# Patient Record
Sex: Male | Born: 1960 | Race: Black or African American | Hispanic: No | Smoking: Former smoker
Health system: Southern US, Community
[De-identification: ages and names within clinical notes are randomized; demographics above are authoritative.]

## PROBLEM LIST (undated history)

## (undated) DIAGNOSIS — I10 Essential (primary) hypertension: Secondary | ICD-10-CM

## (undated) DIAGNOSIS — E559 Vitamin D deficiency, unspecified: Secondary | ICD-10-CM

## (undated) DIAGNOSIS — E119 Type 2 diabetes mellitus without complications: Secondary | ICD-10-CM

## (undated) DIAGNOSIS — K219 Gastro-esophageal reflux disease without esophagitis: Secondary | ICD-10-CM

## (undated) DIAGNOSIS — M109 Gout, unspecified: Secondary | ICD-10-CM

## (undated) DIAGNOSIS — D509 Iron deficiency anemia, unspecified: Secondary | ICD-10-CM

## (undated) DIAGNOSIS — B019 Varicella without complication: Secondary | ICD-10-CM

## (undated) HISTORY — PX: TONSILLECTOMY: SUR1361

## (undated) HISTORY — DX: Varicella without complication: B01.9

## (undated) HISTORY — DX: Gastro-esophageal reflux disease without esophagitis: K21.9

## (undated) HISTORY — DX: Iron deficiency anemia, unspecified: D50.9

## (undated) HISTORY — DX: Essential (primary) hypertension: I10

## (undated) HISTORY — DX: Vitamin D deficiency, unspecified: E55.9

## (undated) HISTORY — DX: Gout, unspecified: M10.9

---

## 2003-03-01 ENCOUNTER — Emergency Department (HOSPITAL_COMMUNITY): Admission: EM | Admit: 2003-03-01 | Discharge: 2003-03-01 | Payer: Self-pay | Admitting: Emergency Medicine

## 2003-09-01 ENCOUNTER — Emergency Department (HOSPITAL_COMMUNITY): Admission: EM | Admit: 2003-09-01 | Discharge: 2003-09-01 | Payer: Self-pay | Admitting: Family Medicine

## 2003-10-12 ENCOUNTER — Emergency Department (HOSPITAL_COMMUNITY): Admission: EM | Admit: 2003-10-12 | Discharge: 2003-10-12 | Payer: Self-pay | Admitting: Family Medicine

## 2003-10-15 ENCOUNTER — Emergency Department (HOSPITAL_COMMUNITY): Admission: EM | Admit: 2003-10-15 | Discharge: 2003-10-15 | Payer: Self-pay | Admitting: Family Medicine

## 2004-12-31 ENCOUNTER — Emergency Department (HOSPITAL_COMMUNITY): Admission: EM | Admit: 2004-12-31 | Discharge: 2004-12-31 | Payer: Self-pay | Admitting: Emergency Medicine

## 2005-01-01 ENCOUNTER — Emergency Department (HOSPITAL_COMMUNITY): Admission: EM | Admit: 2005-01-01 | Discharge: 2005-01-01 | Payer: Self-pay | Admitting: Family Medicine

## 2005-01-03 ENCOUNTER — Emergency Department (HOSPITAL_COMMUNITY): Admission: EM | Admit: 2005-01-03 | Discharge: 2005-01-03 | Payer: Self-pay | Admitting: Family Medicine

## 2005-01-07 ENCOUNTER — Emergency Department (HOSPITAL_COMMUNITY): Admission: EM | Admit: 2005-01-07 | Discharge: 2005-01-07 | Payer: Self-pay | Admitting: Family Medicine

## 2008-02-23 ENCOUNTER — Emergency Department (HOSPITAL_COMMUNITY): Admission: EM | Admit: 2008-02-23 | Discharge: 2008-02-23 | Payer: Self-pay | Admitting: Family Medicine

## 2008-02-24 ENCOUNTER — Emergency Department (HOSPITAL_COMMUNITY): Admission: EM | Admit: 2008-02-24 | Discharge: 2008-02-24 | Payer: Self-pay | Admitting: Emergency Medicine

## 2008-08-20 ENCOUNTER — Emergency Department (HOSPITAL_COMMUNITY): Admission: EM | Admit: 2008-08-20 | Discharge: 2008-08-20 | Payer: Self-pay | Admitting: Emergency Medicine

## 2008-09-01 ENCOUNTER — Emergency Department (HOSPITAL_COMMUNITY): Admission: EM | Admit: 2008-09-01 | Discharge: 2008-09-01 | Payer: Self-pay | Admitting: Emergency Medicine

## 2010-09-07 LAB — HEMOGLOBIN A1C: Hgb A1c MFr Bld: 8.3 % — ABNORMAL HIGH (ref 4.6–6.1)

## 2010-09-07 LAB — GLUCOSE, CAPILLARY

## 2010-09-08 LAB — POCT I-STAT, CHEM 8
BUN: 17 mg/dL (ref 6–23)
Calcium, Ion: 1.17 mmol/L (ref 1.12–1.32)
Chloride: 104 mEq/L (ref 96–112)
Creatinine, Ser: 1 mg/dL (ref 0.4–1.5)
TCO2: 25 mmol/L (ref 0–100)

## 2010-09-22 ENCOUNTER — Inpatient Hospital Stay (INDEPENDENT_AMBULATORY_CARE_PROVIDER_SITE_OTHER)
Admission: RE | Admit: 2010-09-22 | Discharge: 2010-09-22 | Disposition: A | Discharge status: Home / Self Care | Payer: Self-pay | Source: Ambulatory Visit | Attending: Family Medicine | Admitting: Family Medicine

## 2010-09-22 DIAGNOSIS — IMO0002 Reserved for concepts with insufficient information to code with codable children: Secondary | ICD-10-CM

## 2010-09-23 ENCOUNTER — Inpatient Hospital Stay (INDEPENDENT_AMBULATORY_CARE_PROVIDER_SITE_OTHER)
Admission: RE | Admit: 2010-09-23 | Discharge: 2010-09-23 | Disposition: A | Discharge status: Home / Self Care | Payer: Self-pay | Source: Ambulatory Visit | Attending: Family Medicine | Admitting: Family Medicine

## 2010-09-23 DIAGNOSIS — M109 Gout, unspecified: Secondary | ICD-10-CM

## 2010-09-24 ENCOUNTER — Inpatient Hospital Stay (INDEPENDENT_AMBULATORY_CARE_PROVIDER_SITE_OTHER)
Admission: RE | Admit: 2010-09-24 | Discharge: 2010-09-24 | Disposition: A | Discharge status: Home / Self Care | Payer: Self-pay | Source: Ambulatory Visit | Attending: Emergency Medicine | Admitting: Emergency Medicine

## 2010-09-24 DIAGNOSIS — IMO0002 Reserved for concepts with insufficient information to code with codable children: Secondary | ICD-10-CM

## 2010-09-24 DIAGNOSIS — M109 Gout, unspecified: Secondary | ICD-10-CM

## 2010-09-26 ENCOUNTER — Inpatient Hospital Stay (INDEPENDENT_AMBULATORY_CARE_PROVIDER_SITE_OTHER)
Admission: RE | Admit: 2010-09-26 | Discharge: 2010-09-26 | Disposition: A | Discharge status: Home / Self Care | Payer: Self-pay | Source: Ambulatory Visit

## 2010-09-26 DIAGNOSIS — M109 Gout, unspecified: Secondary | ICD-10-CM

## 2010-09-26 DIAGNOSIS — L0291 Cutaneous abscess, unspecified: Secondary | ICD-10-CM

## 2010-09-26 LAB — CULTURE, ROUTINE-ABSCESS

## 2010-09-26 LAB — POCT I-STAT, CHEM 8
BUN: 20 mg/dL (ref 6–23)
Calcium, Ion: 1.13 mmol/L (ref 1.12–1.32)
HCT: 38 % — ABNORMAL LOW (ref 39.0–52.0)
Sodium: 136 mEq/L (ref 135–145)
TCO2: 23 mmol/L (ref 0–100)

## 2010-09-26 LAB — URIC ACID: Uric Acid, Serum: 5.6 mg/dL (ref 4.0–7.8)

## 2010-09-27 ENCOUNTER — Inpatient Hospital Stay (INDEPENDENT_AMBULATORY_CARE_PROVIDER_SITE_OTHER)
Admission: RE | Admit: 2010-09-27 | Discharge: 2010-09-27 | Disposition: A | Discharge status: Home / Self Care | Payer: Self-pay | Source: Ambulatory Visit | Attending: Emergency Medicine | Admitting: Emergency Medicine

## 2010-09-27 DIAGNOSIS — IMO0002 Reserved for concepts with insufficient information to code with codable children: Secondary | ICD-10-CM

## 2010-10-16 ENCOUNTER — Ambulatory Visit (INDEPENDENT_AMBULATORY_CARE_PROVIDER_SITE_OTHER): Payer: Self-pay

## 2010-10-16 ENCOUNTER — Inpatient Hospital Stay (INDEPENDENT_AMBULATORY_CARE_PROVIDER_SITE_OTHER)
Admission: RE | Admit: 2010-10-16 | Discharge: 2010-10-16 | Disposition: A | Discharge status: Home / Self Care | Payer: Self-pay | Source: Ambulatory Visit | Attending: Emergency Medicine | Admitting: Emergency Medicine

## 2010-10-16 ENCOUNTER — Emergency Department (HOSPITAL_COMMUNITY)
Admission: EM | Admit: 2010-10-16 | Discharge: 2010-10-17 | Disposition: A | Discharge status: Home / Self Care | Payer: Self-pay | Attending: Emergency Medicine | Admitting: Emergency Medicine

## 2010-10-16 DIAGNOSIS — M25469 Effusion, unspecified knee: Secondary | ICD-10-CM

## 2010-10-16 DIAGNOSIS — M25569 Pain in unspecified knee: Secondary | ICD-10-CM | POA: Insufficient documentation

## 2010-10-16 LAB — DIFFERENTIAL
Basophils Absolute: 0 10*3/uL (ref 0.0–0.1)
Basophils Relative: 0 % (ref 0–1)
Eosinophils Absolute: 0.1 10*3/uL (ref 0.0–0.7)
Eosinophils Relative: 1 % (ref 0–5)
Lymphs Abs: 2.9 10*3/uL (ref 0.7–4.0)
Neutrophils Relative %: 62 % (ref 43–77)

## 2010-10-16 LAB — CBC
MCV: 77.7 fL — ABNORMAL LOW (ref 78.0–100.0)
Platelets: 280 10*3/uL (ref 150–400)
RBC: 4.85 MIL/uL (ref 4.22–5.81)
RDW: 14.8 % (ref 11.5–15.5)
WBC: 9.8 10*3/uL (ref 4.0–10.5)

## 2010-10-16 LAB — URIC ACID: Uric Acid, Serum: 6.5 mg/dL (ref 4.0–7.8)

## 2010-10-17 ENCOUNTER — Inpatient Hospital Stay (INDEPENDENT_AMBULATORY_CARE_PROVIDER_SITE_OTHER)
Admission: RE | Admit: 2010-10-17 | Discharge: 2010-10-17 | Disposition: A | Discharge status: Home / Self Care | Payer: Self-pay | Source: Ambulatory Visit | Attending: Family Medicine | Admitting: Family Medicine

## 2010-10-17 DIAGNOSIS — M25469 Effusion, unspecified knee: Secondary | ICD-10-CM

## 2011-02-27 LAB — COMPREHENSIVE METABOLIC PANEL
ALT: 66 — ABNORMAL HIGH
AST: 59 — ABNORMAL HIGH
Alkaline Phosphatase: 96
CO2: 23
Calcium: 9.3
Chloride: 98
GFR calc Af Amer: >60
GFR calc non Af Amer: >60
Glucose, Bld: 149 — ABNORMAL HIGH
Potassium: 3.9
Sodium: 132 — ABNORMAL LOW

## 2011-02-27 LAB — POCT I-STAT, CHEM 8
Creatinine, Ser: 1.1
Glucose, Bld: 154 — ABNORMAL HIGH
HCT: 39
Hemoglobin: 13.3
Sodium: 134 — ABNORMAL LOW
TCO2: 23

## 2011-02-27 LAB — CBC
Hemoglobin: 12.4 — ABNORMAL LOW
MCHC: 32.5
RBC: 4.75
WBC: 9.2

## 2011-02-27 LAB — DIFFERENTIAL
Basophils Relative: 0
Eosinophils Absolute: 0
Eosinophils Relative: 0
Lymphs Abs: 1.6
Neutrophils Relative %: 70

## 2012-10-06 ENCOUNTER — Encounter (HOSPITAL_COMMUNITY): Payer: Self-pay

## 2012-10-06 ENCOUNTER — Emergency Department (INDEPENDENT_AMBULATORY_CARE_PROVIDER_SITE_OTHER)
Admission: EM | Admit: 2012-10-06 | Discharge: 2012-10-06 | Disposition: A | Discharge status: Home / Self Care | Payer: Self-pay | Source: Home / Self Care | Attending: Family Medicine | Admitting: Family Medicine

## 2012-10-06 DIAGNOSIS — J029 Acute pharyngitis, unspecified: Secondary | ICD-10-CM

## 2012-10-06 MED ORDER — CETIRIZINE HCL 10 MG PO TABS: 10.0000 mg | ORAL_TABLET | Freq: Every day | ORAL | Status: DC | PRN

## 2012-10-06 MED ORDER — AMOXICILLIN 500 MG PO CAPS: 500.0000 mg | ORAL_CAPSULE | Freq: Three times a day (TID) | ORAL | Status: DC

## 2012-10-06 MED ORDER — ACETAMINOPHEN-CODEINE 120-12 MG/5ML PO SOLN: 5.0000 mL | Freq: Four times a day (QID) | ORAL | Status: DC | PRN

## 2012-10-06 MED ORDER — OMEPRAZOLE 20 MG PO CPDR: 20.0000 mg | DELAYED_RELEASE_CAPSULE | Freq: Every day | ORAL | Status: DC

## 2012-10-06 NOTE — ED Notes (Signed)
C/o has not felt well for 2 weeks; has been using OTC's w little relief  for ST, chest congestion, hoarseness, head swimming

## 2012-10-06 NOTE — ED Provider Notes (Signed)
History     CSN: [id number]  Arrival date & time 10/06/12  1111   First MD Initiated Contact with Patient 10/06/12 1126      Chief Complaint  Patient presents with  . Sore Throat    (Consider location/radiation/quality/duration/timing/severity/associated sxs/prior treatment) HPI Comments: 52 year old male here complaining of sore throat, nasal congestion, hoarseness and chest congestion for 2 weeks. Denies fever or chills.   History reviewed. No pertinent past medical history.  History reviewed. No pertinent past surgical history.  History reviewed. No pertinent family history.  History  Substance Use Topics  . Smoking status: Not on file  . Smokeless tobacco: Not on file  . Alcohol Use: Not on file      Review of Systems  Constitutional: Negative for fever, chills, diaphoresis, appetite change and fatigue.  HENT: Positive for congestion, sore throat, rhinorrhea and trouble swallowing.   Respiratory: Negative for cough, shortness of breath and wheezing.   Cardiovascular: Negative for chest pain, palpitations and leg swelling.  Gastrointestinal: Negative for nausea, vomiting, abdominal pain and diarrhea.  Skin: Negative for rash.  Neurological: Negative for dizziness and headaches.  All other systems reviewed and are negative.    Allergies  Review of patient's allergies indicates no known allergies.  Home Medications   Current Outpatient Rx  Name  Route  Sig  Dispense  Refill  . acetaminophen-codeine 120-12 MG/5ML solution   Oral   Take 5 mLs by mouth every 6 (six) hours as needed for pain.   120 mL   0   . amoxicillin (AMOXIL) 500 MG capsule   Oral   Take 1 capsule (500 mg total) by mouth 3 (three) times daily.   21 capsule   0   . cetirizine (ZYRTEC) 10 MG tablet   Oral   Take 1 tablet (10 mg total) by mouth daily as needed for allergies.   30 tablet   0   . omeprazole (PRILOSEC) 20 MG capsule   Oral   Take 1 capsule (20 mg total) by mouth  daily.   30 capsule   0     BP 147/63  Pulse 69  Temp(Src) 98.3 F (36.8 C) (Oral)  Resp 16  SpO2 100%  Physical Exam  Nursing note and vitals reviewed. Constitutional: He is oriented to person, place, and time. He appears well-developed and well-nourished. No distress.  HENT:  Head: Normocephalic and atraumatic.  Nasal Congestion with erythema and swelling of nasal turbinates, clear rhinorrhea. Significant pharyngeal erythema white exudates over her left pilar. No uvula deviation. No trismus. TM's impress clear fluid behind otherwise normal bilaterally.  Eyes: Conjunctivae are normal. Right eye exhibits no discharge. Left eye exhibits no discharge.  Neck: Neck supple. No JVD present. No thyromegaly present.  Cardiovascular: Normal rate, regular rhythm and normal heart sounds.   Pulmonary/Chest: Effort normal and breath sounds normal. No respiratory distress. He has no wheezes. He has no rales.  Abdominal: Soft. Bowel sounds are normal. He exhibits no distension and no mass. There is no tenderness. There is no rebound and no guarding.  Lymphadenopathy:    He has no cervical adenopathy.  Neurological: He is alert and oriented to person, place, and time.  Skin: No rash noted. He is not diaphoretic.    ED Course  Procedures (including critical care time)  Labs Reviewed - No data to display No results found.   1. Pharyngitis, acute       MDM  Treated with amoxicillin, cetirizine, omeprazole, acetaminophen/codeine. Supportive  care and red flags should prompt his return to medical attention discussed with patient and provided in writing.        Sharin Grave, MD 10/07/12 1032

## 2012-10-06 NOTE — Discharge Instructions (Signed)
Is very important top keep well hydrated. Take the prescribed medications as instructed. Use nasal saline spray at least 3 times a day. (simply saline is over the counter) Return if worsening symptoms like chest pain, difficulty breathing or not keeping fluids down.

## 2012-10-09 ENCOUNTER — Encounter (HOSPITAL_COMMUNITY): Payer: Self-pay | Admitting: Emergency Medicine

## 2012-10-09 ENCOUNTER — Emergency Department (INDEPENDENT_AMBULATORY_CARE_PROVIDER_SITE_OTHER)
Admission: EM | Admit: 2012-10-09 | Discharge: 2012-10-09 | Disposition: A | Discharge status: Home / Self Care | Payer: Self-pay | Source: Home / Self Care

## 2012-10-09 DIAGNOSIS — R1312 Dysphagia, oropharyngeal phase: Secondary | ICD-10-CM

## 2012-10-09 DIAGNOSIS — R066 Hiccough: Secondary | ICD-10-CM

## 2012-10-09 MED ORDER — AZITHROMYCIN 250 MG PO TABS: ORAL_TABLET | ORAL | Status: DC

## 2012-10-09 NOTE — ED Provider Notes (Signed)
History     CSN: [id number]  Arrival date & time 10/09/12  1158   First MD Initiated Contact with Patient 10/09/12 1342      Chief Complaint  Patient presents with  . Allergic Reaction    (Consider location/radiation/quality/duration/timing/severity/associated sxs/prior treatment) HPI Comments: This 52 year old man was seen in the urgent care 3 days ago for sore throat and upper respiratory congestion. He was treated with Tylenol with codeine liquid, amoxicillin and Prilosec. At that time he was complaining of a feeling of airway and in his lower posterior pharynx and some trouble swallowing. His last and then has persisted. He also developed hiccups yesterday they come and go and sometimes will last for one to 2 hours at a time.   History reviewed. No pertinent past medical history.  History reviewed. No pertinent past surgical history.  History reviewed. No pertinent family history.  History  Substance Use Topics  . Smoking status: Never Smoker   . Smokeless tobacco: Not on file  . Alcohol Use: No      Review of Systems  Constitutional: Negative.   HENT: Positive for sore throat and trouble swallowing. Negative for neck pain and neck stiffness.   Respiratory: Negative for chest tightness, shortness of breath, wheezing and stridor.   Cardiovascular: Negative.   Gastrointestinal: Negative.   Genitourinary: Negative.   Neurological: Negative.     Allergies  Review of patient's allergies indicates no known allergies.  Home Medications   Current Outpatient Rx  Name  Route  Sig  Dispense  Refill  . acetaminophen-codeine 120-12 MG/5ML solution   Oral   Take 5 mLs by mouth every 6 (six) hours as needed for pain.   120 mL   0   . amoxicillin (AMOXIL) 500 MG capsule   Oral   Take 1 capsule (500 mg total) by mouth 3 (three) times daily.   21 capsule   0   . cetirizine (ZYRTEC) 10 MG tablet   Oral   Take 1 tablet (10 mg total) by mouth daily as needed for  allergies.   30 tablet   0   . omeprazole (PRILOSEC) 20 MG capsule   Oral   Take 1 capsule (20 mg total) by mouth daily.   30 capsule   0   . azithromycin (ZITHROMAX) 250 MG tablet      Take 2 tabs daily for 4 days   8 tablet   0     There were no vitals taken for this visit.  Physical Exam  Nursing note and vitals reviewed. Constitutional: He is oriented to person, place, and time. He appears well-developed and well-nourished. No distress.  HENT:  Oropharynx with minor erythema. There is no swelling or exudates today. Airway is widely patent. Patient currently having hiccups.  Eyes: Conjunctivae and EOM are normal.  Neck: Normal range of motion. Neck supple.  Cardiovascular: Normal rate and normal heart sounds.   Pulmonary/Chest: Effort normal and breath sounds normal. No respiratory distress. He has no wheezes.  While having the patient take deep breaths for examination his hiccups abated.  Musculoskeletal: Normal range of motion. He exhibits no edema and no tenderness.  Lymphadenopathy:    He has no cervical adenopathy.  Neurological: He is alert and oriented to person, place, and time.  Skin: Skin is warm and dry.  Psychiatric: He has a normal mood and affect.    ED Course  Procedures (including critical care time)  Labs Reviewed - No data to display No  results found.   1. Hiccups   2. Dysphagia, oropharyngeal       MDM  Stop the Tylenol with Codeine and the amoxicillin. I do not really think that these are causing an allergic reaction or potential side effect however since the patient believes it might be giving him a problem we will just stop these.  May continue taking the Prilosec as directed  Change antibiotic to azithromycin 250 mg 2 every day for 4 days.  If he continued to have problems swallowing  food or getting stuck in the throat go to the emergency department promptly.  May also call [phone] to obtain an appointment with the new health care  clinic.   Hayden Rasmussen, NP 10/09/12 [phone]

## 2012-10-09 NOTE — ED Notes (Signed)
Pt was seen here 10/06/12 and given codiene cough syrup. Pt states when he took the initial dose felt like his throat was closing. Hiccups started yesterday and have not stopped. Patient states he has a slight HA. No fever, no itching in throat. Last took cough syrup last night. Patient is alert and oriented.

## 2012-10-11 NOTE — ED Provider Notes (Signed)
Medical screening examination/treatment/procedure(s) were performed by non-physician practitioner and as supervising physician I was immediately available for consultation/collaboration.   MORENO-COLL,Kade Rickels; MD  Jenessa Gillingham Moreno-Coll, MD 10/11/12 0820 

## 2012-11-30 ENCOUNTER — Emergency Department (INDEPENDENT_AMBULATORY_CARE_PROVIDER_SITE_OTHER)
Admission: EM | Admit: 2012-11-30 | Discharge: 2012-11-30 | Disposition: A | Discharge status: Home / Self Care | Payer: Self-pay | Source: Home / Self Care | Attending: Emergency Medicine | Admitting: Emergency Medicine

## 2012-11-30 ENCOUNTER — Encounter (HOSPITAL_COMMUNITY): Payer: Self-pay | Admitting: Emergency Medicine

## 2012-11-30 DIAGNOSIS — M25519 Pain in unspecified shoulder: Secondary | ICD-10-CM

## 2012-11-30 DIAGNOSIS — M25511 Pain in right shoulder: Secondary | ICD-10-CM

## 2012-11-30 MED ORDER — NAPROXEN 500 MG PO TABS: 500.0000 mg | ORAL_TABLET | Freq: Two times a day (BID) | ORAL | Status: DC

## 2012-11-30 MED ORDER — KETOROLAC TROMETHAMINE 60 MG/2ML IM SOLN
60.0000 mg | Freq: Once | INTRAMUSCULAR | Status: AC
  Administered 2012-11-30: 60 mg via INTRAMUSCULAR

## 2012-11-30 MED ORDER — TRAMADOL HCL 50 MG PO TABS: 50.0000 mg | ORAL_TABLET | Freq: Four times a day (QID) | ORAL | Status: DC | PRN

## 2012-11-30 MED ORDER — METHYLPREDNISOLONE ACETATE 80 MG/ML IJ SUSP
INTRAMUSCULAR | Status: AC
  Filled 2012-11-30: qty 1

## 2012-11-30 MED ORDER — METHYLPREDNISOLONE ACETATE 40 MG/ML IJ SUSP
80.0000 mg | Freq: Once | INTRAMUSCULAR | Status: AC
  Administered 2012-11-30: 80 mg via INTRAMUSCULAR

## 2012-11-30 MED ORDER — KETOROLAC TROMETHAMINE 60 MG/2ML IM SOLN
INTRAMUSCULAR | Status: AC
  Filled 2012-11-30: qty 2

## 2012-11-30 NOTE — ED Provider Notes (Signed)
Medical screening examination/treatment/procedure(s) were performed by non-physician practitioner and as supervising physician I was immediately available for consultation/collaboration.  Leslee Home, M.D.  Reuben Likes, MD 11/30/12 [phone]

## 2012-11-30 NOTE — ED Notes (Signed)
Pt c/o right sided neck/shoulder pain. Stiffness "think I slept the wrong way". Pain is a constant ache. Symptoms present x 1 wk.  Pt has used ben gay with no relief. Pt states that a week prior he was cleaning right ear and went to far and questions if that is the reason for symptoms now. Pt is sitting up right no signs of distress.

## 2012-11-30 NOTE — ED Provider Notes (Signed)
History    CSN: [id number] Arrival date & time 11/30/12  0941  First MD Initiated Contact with Patient 11/30/12 1038     Chief Complaint  Patient presents with  . Neck Pain    right sided neck/shoulder pain. denies injury or heavy lifting. symptoms present x 1 wk.    (Consider location/radiation/quality/duration/timing/severity/associated sxs/prior Treatment) HPI Comments: 52 year old male presents complaining of pain in his right neck out to his right shoulder. This is been going on for one week. He first noticed this when he woke up in the morning one week ago and thinks it may be from sleeping wrong. He has been treating it with BenGay which does help. The pain is exacerbated by any movements of his head and neck but not the arm. He states it feels like a "inside" and is not tender at all. he denies any other symptoms including fever, chills, chest pain, shortness of breath, nausea, vomiting, diaphoresis. He has never experienced pain like this before in the past.  He also is having some irritation in his right ear. He was trying to clean this earlier with a Q-tip and thinks he may have shoved something got into his ear  Patient is a 52 y.o. male presenting with neck pain.  Neck Pain Associated symptoms: no chest pain, no fever and no weakness    History reviewed. No pertinent past medical history. History reviewed. No pertinent past surgical history. History reviewed. No pertinent family history. History  Substance Use Topics  . Smoking status: Never Smoker   . Smokeless tobacco: Not on file  . Alcohol Use: No    Review of Systems  Constitutional: Negative for fever, chills and fatigue.  HENT: Positive for neck pain. Negative for hearing loss, ear pain (Irritation, not pain), sore throat, neck stiffness and tinnitus.   Eyes: Negative for visual disturbance.  Respiratory: Negative for cough and shortness of breath.   Cardiovascular: Negative for chest pain, palpitations and  leg swelling.  Gastrointestinal: Negative for nausea, vomiting, abdominal pain, diarrhea and constipation.  Genitourinary: Negative for dysuria, urgency, frequency and hematuria.  Musculoskeletal: Positive for arthralgias. Negative for myalgias.  Skin: Negative for rash.  Neurological: Negative for dizziness, weakness and light-headedness.    Allergies  Review of patient's allergies indicates no known allergies.  Home Medications   Current Outpatient Rx  Name  Route  Sig  Dispense  Refill  . acetaminophen-codeine 120-12 MG/5ML solution   Oral   Take 5 mLs by mouth every 6 (six) hours as needed for pain.   120 mL   0   . amoxicillin (AMOXIL) 500 MG capsule   Oral   Take 1 capsule (500 mg total) by mouth 3 (three) times daily.   21 capsule   0   . azithromycin (ZITHROMAX) 250 MG tablet      Take 2 tabs daily for 4 days   8 tablet   0   . cetirizine (ZYRTEC) 10 MG tablet   Oral   Take 1 tablet (10 mg total) by mouth daily as needed for allergies.   30 tablet   0   . naproxen (NAPROSYN) 500 MG tablet   Oral   Take 1 tablet (500 mg total) by mouth 2 (two) times daily.   60 tablet   0   . omeprazole (PRILOSEC) 20 MG capsule   Oral   Take 1 capsule (20 mg total) by mouth daily.   30 capsule   0   . traMADol (  ULTRAM) 50 MG tablet   Oral   Take 1 tablet (50 mg total) by mouth every 6 (six) hours as needed for pain.   20 tablet   0    BP 163/82  Pulse 65  Temp(Src) 98.2 F (36.8 C) (Oral)  Resp 12  SpO2 100% Physical Exam  Nursing note and vitals reviewed. Constitutional: He is oriented to person, place, and time. He appears well-developed and well-nourished. No distress.  HENT:  Head: Normocephalic and atraumatic.  Right Ear: Hearing and tympanic membrane normal. A foreign body (Small amount of earwax down in the ear) is present.  Left Ear: Hearing, tympanic membrane and ear canal normal.  Eyes: EOM are normal. Pupils are equal, round, and reactive to  light.  Cardiovascular: Normal rate and regular rhythm.  Exam reveals no gallop and no friction rub.   No murmur heard. Pulmonary/Chest: Effort normal and breath sounds normal. No respiratory distress. He has no wheezes. He has no rales.  Abdominal: Soft. There is no tenderness.  Musculoskeletal:       Right shoulder: He exhibits normal range of motion, no tenderness, no bony tenderness, no swelling, no deformity, no pain, no spasm and normal strength.       Cervical back: He exhibits pain (pain through the right shoulder with leftward rotation or left lateral movement of the head and neck).  Neurological: He is oriented to person, place, and time.  Skin: Skin is warm and dry. No rash noted.  Psychiatric: He has a normal mood and affect. Judgment normal.    ED Course  Procedures (including critical care time) Labs Reviewed - No data to display No results found. 1. Shoulder pain, right     MDM  We'll treat this with Toradol and Depo-Medrol here, and discharged with naproxen and tramadol. He will follow up if he does not improve. Cerumen flushed out of right ear as well    Meds ordered this encounter  Medications  . naproxen (NAPROSYN) 500 MG tablet    Sig: Take 1 tablet (500 mg total) by mouth 2 (two) times daily.    Dispense:  60 tablet    Refill:  0  . traMADol (ULTRAM) 50 MG tablet    Sig: Take 1 tablet (50 mg total) by mouth every 6 (six) hours as needed for pain.    Dispense:  20 tablet    Refill:  0  . ketorolac (TORADOL) injection 60 mg    Sig:   . methylPREDNISolone acetate (DEPO-MEDROL) injection 80 mg    Sig:      Graylon Good, PA-C 11/30/12 1102

## 2012-12-13 ENCOUNTER — Emergency Department (INDEPENDENT_AMBULATORY_CARE_PROVIDER_SITE_OTHER)
Admission: EM | Admit: 2012-12-13 | Discharge: 2012-12-13 | Disposition: A | Discharge status: Home / Self Care | Payer: Self-pay | Source: Home / Self Care | Attending: Emergency Medicine | Admitting: Emergency Medicine

## 2012-12-13 ENCOUNTER — Emergency Department (INDEPENDENT_AMBULATORY_CARE_PROVIDER_SITE_OTHER): Payer: Self-pay

## 2012-12-13 ENCOUNTER — Encounter (HOSPITAL_COMMUNITY): Payer: Self-pay | Admitting: Emergency Medicine

## 2012-12-13 DIAGNOSIS — M719 Bursopathy, unspecified: Secondary | ICD-10-CM

## 2012-12-13 DIAGNOSIS — M7581 Other shoulder lesions, right shoulder: Secondary | ICD-10-CM

## 2012-12-13 MED ORDER — HYDROCODONE-ACETAMINOPHEN 5-325 MG PO TABS
2.0000 | ORAL_TABLET | Freq: Once | ORAL | Status: AC
  Administered 2012-12-13: 2 via ORAL

## 2012-12-13 MED ORDER — OXYCODONE-ACETAMINOPHEN 5-325 MG PO TABS: ORAL_TABLET | ORAL | Status: DC

## 2012-12-13 MED ORDER — DICLOFENAC SODIUM 75 MG PO TBEC: 75.0000 mg | DELAYED_RELEASE_TABLET | Freq: Two times a day (BID) | ORAL | Status: DC

## 2012-12-13 MED ORDER — METHYLPREDNISOLONE ACETATE 40 MG/ML IJ SUSP
INTRAMUSCULAR | Status: AC
  Filled 2012-12-13: qty 5

## 2012-12-13 MED ORDER — HYDROCODONE-ACETAMINOPHEN 5-325 MG PO TABS
ORAL_TABLET | ORAL | Status: AC
  Filled 2012-12-13: qty 2

## 2012-12-13 NOTE — ED Provider Notes (Signed)
Chief Complaint:   Chief Complaint  Patient presents with  . Shoulder Pain    right neck and shoulder pain     History of Present Illness:   Steven [name] is a 52 year old male who has had a three-week history of pain in the right shoulder which radiates up into the trapezius ridge and into the upper arm. The pain is worse with movement of the neck but especially with movement of the shoulder particularly with abduction and sometimes radiates to the upper chest. He denies any radiation down below the elbow, numbness, tingling, or weakness in the arm. There is been no injury to the shoulder. He was here this past Sunday, 6 days ago. He got some shots of Toradol and Depo-Medrol and tramadol and Naprosyn by mouth for pain. He states these have not helped at all. He did not get an x-ray.  Review of Systems:  Other than noted above, the patient denies any of the following symptoms: Systemic:  No fevers, chills, sweats, or aches.  No fatigue or tiredness. Musculoskeletal:  No joint pain, arthritis, bursitis, swelling, back pain, or neck pain. Neurological:  No muscular weakness, paresthesias, headache, or trouble with speech or coordination.  No dizziness.  PMFSH:  Past medical history, family history, social history, meds, and allergies were reviewed.    Physical Exam:   Vital signs:  BP 149/89  Pulse 56  Temp(Src) 98.1 F (36.7 C) (Oral)  Resp 22  SpO2 100% Gen:  Alert and oriented times 3.  In no distress. Musculoskeletal: There is pain to palpation over the trapezius ridge and over the supraspinatus area. There was no pain to palpation of the shoulder itself and no obvious swelling and he does have pain on active abduction but not on passive abduction of the shoulder. The shoulder has a full range of motion passively and actively. Neer test was negative.  Hawkins test was negative.  Empty cans test was positive with muscle weakness. Otherwise, all joints had a full a ROM with no swelling,  bruising or deformity.  No edema, pulses full. Extremities were warm and pink.  Capillary refill was brisk.  Skin:  Clear, warm and dry.  No rash. Neuro:  Alert and oriented times 3.  Muscle strength was normal.  Sensation was intact to light touch.   Radiology:  Dg Shoulder Right  12/13/2012   *RADIOLOGY REPORT*  Clinical Data: Right shoulder pain  RIGHT SHOULDER - 2+ VIEW  Comparison: None.  Findings: No fracture or dislocation is seen.  Mild degenerative changes of the acromioclavicular joint.  The visualized soft tissues are unremarkable.  Visualized right lung is clear.  IMPRESSION: No fracture or dislocation is seen.   Original Report Authenticated By: Charline Bills, M.D.  Procedure Note:  Verbal informed consent was obtained from the patient.  Risks and benefits were outlined with the patient.  Patient understands and accepts these risks.  Identity of the patient was confirmed verbally and by armband.    Procedure was performed as follows:  The posterior aspect of the shoulder was prepped with Betadine and alcohol and anesthetized with ethyl chloride spray and the subacromial space was entered with a 1/2 inch 27-gauge needle and 1 cc of Depo-Medrol 40 mg strength and 1 cc of 2% Xylocaine were injected. The patient tolerated the procedure well.  Patient tolerated the procedure well without any immediate complications.  Course in Urgent Care Center:   For pain he was given Norco 5/325. He was placed in a  shoulder sling.  Assessment:  The encounter diagnosis was Rotator cuff tendonitis, right.  Differential diagnosis includes rotator cuff tendinitis, rotator cuff tear, a.c. joint arthritis, labral tear, or cervical radiculopathy. He will need followup with orthopedics.  Plan:   1.  The following meds were prescribed:   Discharge Medication List as of 12/13/2012  8:07 PM    START taking these medications   Details  diclofenac (VOLTAREN) 75 MG EC tablet Take 1 tablet (75 mg total) by  mouth 2 (two) times daily., Starting 12/13/2012, Until Discontinued, Normal    oxyCODONE-acetaminophen (PERCOCET) 5-325 MG per tablet 1 to 2 tablets every 6 hours as needed for pain., Print       2.  The patient was instructed in symptomatic care, including rest and activity, elevation, application of ice and compression.  Appropriate handouts were given. He was told to rest for 3 days and use a sling, then begin with the exercises. 3.  The patient was told to return if becoming worse in any way, if no better in 3 or 4 days, and given some red flag symptoms such as worsening pain or neurological symptoms that would indicate earlier return.   4.  The patient was told to follow up with Dr. Aldean Baker next week.    Reuben Likes, MD 12/13/12 2109

## 2012-12-13 NOTE — ED Notes (Signed)
Pt c/o right neck and shoulder pain. Pt was recently seen on 7/5 for same symptoms.  Pt states that the pain is worse. Medications not working. Pt denies any new symptoms and or injury.

## 2013-01-07 ENCOUNTER — Encounter (HOSPITAL_COMMUNITY): Payer: Self-pay | Admitting: Emergency Medicine

## 2013-01-07 ENCOUNTER — Emergency Department (HOSPITAL_COMMUNITY)
Admission: EM | Admit: 2013-01-07 | Discharge: 2013-01-07 | Disposition: A | Discharge status: Home / Self Care | Payer: Self-pay | Source: Home / Self Care

## 2013-01-07 DIAGNOSIS — M25519 Pain in unspecified shoulder: Secondary | ICD-10-CM

## 2013-01-07 DIAGNOSIS — M25511 Pain in right shoulder: Secondary | ICD-10-CM

## 2013-01-07 MED ORDER — DICLOFENAC SODIUM 75 MG PO TBEC: 75.0000 mg | DELAYED_RELEASE_TABLET | Freq: Two times a day (BID) | ORAL | Status: DC

## 2013-01-07 NOTE — ED Notes (Signed)
Pt requesting refill on diclofenac 75mg  and oxycodone 5-325. Pt voices no other concerns.

## 2013-01-07 NOTE — ED Provider Notes (Signed)
Steven [name] is a 52 y.o. male who presents to Urgent Care today for followup right shoulder rotator cuff pain. Patient was seen about one month ago for right shoulder pain and was diagnosed with rotator cuff tendinitis. He is prescribed diclofenac for oxycodone. He's been unable to followup with an orthopedic surgeon due to no insurance. He notes his shoulder pain has significantly improved. He denies any weakness or numbness. He notes that he has weaned himself mostly of oxycodone but is using diclofenac. He would like refills above oxycodone and diclofenac if possible. Pain is mild without radiation worse with activity better with rest.    PMH reviewed. Healthy History  Substance Use Topics  . Smoking status: Never Smoker   . Smokeless tobacco: Not on file  . Alcohol Use: No   ROS as above Medications reviewed. No current facility-administered medications for this encounter.   Current Outpatient Prescriptions  Medication Sig Dispense Refill  . diclofenac (VOLTAREN) 75 MG EC tablet Take 1 tablet (75 mg total) by mouth 2 (two) times daily.  30 tablet  0  . [DISCONTINUED] cetirizine (ZYRTEC) 10 MG tablet Take 1 tablet (10 mg total) by mouth daily as needed for allergies.  30 tablet  0  . [DISCONTINUED] omeprazole (PRILOSEC) 20 MG capsule Take 1 capsule (20 mg total) by mouth daily.  30 capsule  0    Exam:  BP 134/96  Pulse 51  Temp(Src) 98 F (36.7 C) (Oral)  Resp 18  SpO2 100% Gen: Well NAD HEENT: EOMI,  MMM Right shoulder: Normal-appearing nontender full range of motion negative impingement testing. Mildly weak and abduction ARC. Capillary refill sensation good strength is intact distal  No results found for this or any previous visit (from the past 24 hour(s)). No results found.  Assessment and Plan: 52 y.o. male with improving right rotator cuff tendinitis. Plan to refill diclofenac and not refill Percocet.  Follow up with sports medicine if possible Referred to orange  card Discussed warning signs or symptoms. Please see discharge instructions. Patient expresses understanding.      Rodolph Bong, MD 01/07/13 2041

## 2014-10-08 ENCOUNTER — Emergency Department (INDEPENDENT_AMBULATORY_CARE_PROVIDER_SITE_OTHER)
Admission: EM | Admit: 2014-10-08 | Discharge: 2014-10-08 | Disposition: A | Discharge status: Home / Self Care | Payer: Self-pay | Source: Home / Self Care | Attending: Family Medicine | Admitting: Family Medicine

## 2014-10-08 ENCOUNTER — Encounter (HOSPITAL_COMMUNITY): Payer: Self-pay | Admitting: Emergency Medicine

## 2014-10-08 DIAGNOSIS — E1165 Type 2 diabetes mellitus with hyperglycemia: Secondary | ICD-10-CM

## 2014-10-08 DIAGNOSIS — G44209 Tension-type headache, unspecified, not intractable: Secondary | ICD-10-CM

## 2014-10-08 DIAGNOSIS — I10 Essential (primary) hypertension: Secondary | ICD-10-CM

## 2014-10-08 DIAGNOSIS — IMO0002 Reserved for concepts with insufficient information to code with codable children: Secondary | ICD-10-CM

## 2014-10-08 LAB — POCT I-STAT, CHEM 8
BUN: 15 mg/dL (ref 6–20)
CHLORIDE: 97 mmol/L — AB (ref 101–111)
Calcium, Ion: 1.16 mmol/L (ref 1.12–1.23)
Creatinine, Ser: 0.7 mg/dL (ref 0.61–1.24)
Glucose, Bld: 410 mg/dL — ABNORMAL HIGH (ref 65–99)
HEMATOCRIT: 40 % (ref 39.0–52.0)
Hemoglobin: 13.6 g/dL (ref 13.0–17.0)
POTASSIUM: 3.8 mmol/L (ref 3.5–5.1)
SODIUM: 133 mmol/L — AB (ref 135–145)
TCO2: 20 mmol/L (ref 0–100)

## 2014-10-08 MED ORDER — METFORMIN HCL 500 MG PO TABS: 500.0000 mg | ORAL_TABLET | Freq: Two times a day (BID) | ORAL | Status: DC

## 2014-10-08 MED ORDER — LISINOPRIL 20 MG PO TABS: 20.0000 mg | ORAL_TABLET | Freq: Every day | ORAL | Status: DC

## 2014-10-08 NOTE — ED Provider Notes (Signed)
Steven [name] is a 54 y.o. male who presents to Urgent Care today for headache. Patient has a moderate headache now present for a week. It is left-sided and pounding. He denies any weakness or numbness or loss of function. He does note fatigue. He does not go to the doctor therefore has no known medical problems. No fevers or chills vomiting or diarrhea. No vision changes.   History reviewed. No pertinent past medical history. History reviewed. No pertinent past surgical history. History  Substance Use Topics  . Smoking status: Never Smoker   . Smokeless tobacco: Not on file  . Alcohol Use: No   ROS as above Medications: No current facility-administered medications for this encounter.   Current Outpatient Prescriptions  Medication Sig Dispense Refill  . lisinopril (PRINIVIL,ZESTRIL) 20 MG tablet Take 1 tablet (20 mg total) by mouth daily. 30 tablet 0  . metFORMIN (GLUCOPHAGE) 500 MG tablet Take 1 tablet (500 mg total) by mouth 2 (two) times daily with a meal. 60 tablet 0  . [DISCONTINUED] cetirizine (ZYRTEC) 10 MG tablet Take 1 tablet (10 mg total) by mouth daily as needed for allergies. 30 tablet 0  . [DISCONTINUED] omeprazole (PRILOSEC) 20 MG capsule Take 1 capsule (20 mg total) by mouth daily. 30 capsule 0   No Known Allergies   Exam:  BP 172/121 mmHg  Pulse 65  Temp(Src) 98.1 F (36.7 C) (Oral)  Resp 16  SpO2 97%  BP on recheck was 168/100 using large cuff.  Gen: Well NAD HEENT: EOMI,  MMM PERRLA no oral lesions present. Panic membranes are normal appearing bilaterally. Lungs: Normal work of breathing. CTABL Heart: RRR no MRG Abd: NABS, Soft. Nondistended, Nontender Exts: Brisk capillary refill, warm and well perfused.  Neuro: Alert and oriented normal coordination gait and balance/   Results for orders placed or performed during the hospital encounter of 10/08/14 (from the past 24 hour(s))  I-STAT, chem 8     Status: Abnormal   Collection Time: 10/08/14  8:17 PM   Result Value Ref Range   Sodium 133 (L) 135 - 145 mmol/L   Potassium 3.8 3.5 - 5.1 mmol/L   Chloride 97 (L) 101 - 111 mmol/L   BUN 15 6 - 20 mg/dL   Creatinine, Ser 0.70 0.61 - 1.24 mg/dL   Glucose, Bld 410 (H) 65 - 99 mg/dL   Calcium, Ion 1.16 1.12 - 1.23 mmol/L   TCO2 20 0 - 100 mmol/L   Hemoglobin 13.6 13.0 - 17.0 g/dL   HCT 40.0 39.0 - 52.0 %   No results found.  Assessment and Plan: 54 y.o. male with  1) hypertension: This is chronic and ongoing. Likely asymptomatic. Possibly related to headache. Treat with lisinopril 20 mg daily. Return to clinic in 1 week for lab recheck and blood pressure recheck follow-up with PCP 2) diabetes this is new to the patient however upon review of labs he has had significantly elevated blood sugar for some time now over 4 years at least. He has not extremity snores he significantly dehydrated. Plan to start metformin 500 mg twice daily and recheck in about a week 3) headache: Likely related to diabetes and hypertension. Hopefully control these problems will control headache better. He has no significant neurological abnormalities today. 4) no PCP or health insurance: This is a significant barrier to his health. Will return in 1 week for lab recheck. Refer to our financial assistance for referral to [facility] wellness clinic.  Discussed warning signs  or symptoms. Please see discharge instructions. Patient expresses understanding.     Gregor Hams, MD 10/08/14 [phone]

## 2014-10-08 NOTE — Discharge Instructions (Signed)
Thank you for coming in today. Return in 1 week for lab recheck.  Go to the ER if you get worse.  Call or go to the emergency room if you get worse, have trouble breathing, have chest pains, or palpitations.  Go to the emergency room if your headache becomes excruciating or you have weakness or numbness or uncontrolled vomiting.   START lisinopril and metformin.   Steven [name], [place] [address] [phone]  Please call or see [name] for assistance with your bill.  You may qualify for reduced or free services.  Her phone number is [phone]. Her email is [email or web address]  How to Avoid Diabetes Problems You can do a lot to prevent or slow down diabetes problems. Following your diabetes plan and taking care of yourself can reduce your risk of serious or life-threatening complications. Below, you will find certain things you can do to prevent diabetes problems. MANAGE YOUR DIABETES Follow your health care provider's, nurse educator's, and dietitian's instructions for managing your diabetes. They will teach you the basics of diabetes care. They can help answer questions you may have. Learn about diabetes and make healthy choices regarding eating and physical activity. Monitor your blood glucose level regularly. Your health care provider will help you decide how often to check your blood glucose level depending on your treatment goals and how well you are meeting them.  DO NOT USE NICOTINE Nicotine and diabetes are a dangerous combination. Nicotine raises your risk for diabetes problems. If you quit using nicotine, you will lower your risk for heart attack, stroke, nerve disease, and kidney disease. Your cholesterol and your blood pressure levels may improve. Your blood circulation will also improve. Do not use any tobacco products, including cigarettes, chewing tobacco, or electronic cigarettes. If you need  help quitting, ask your health care provider. KEEP YOUR BLOOD PRESSURE UNDER CONTROL Keeping your blood pressure under control will help prevent damage to your eyes, kidneys, heart, and blood vessels. Blood pressure consists of two numbers. The top number should be below 120, and the bottom number should be below 80 (120/80). Keep your blood pressure as close to these numbers as you can. If you already have kidney disease, you may want even lower blood pressure to protect your kidneys. Talk to your health care provider to make sure that your blood pressure goal is right for your needs. Meal planning, medicines, and exercise can help you reach your blood pressure target. Have your blood pressure checked at every visit with your health care provider. KEEP YOUR CHOLESTEROL UNDER CONTROL Normal cholesterol levels will help prevent heart disease and stroke. These are the biggest health problems for people with diabetes. Keeping cholesterol levels under control can also help with blood flow. Have your cholesterol level checked at least once a year. Your health care provider may prescribe a medicine known as a statin. Statins lower your cholesterol. If you are not taking a statin, ask your health care provider if you should be. Meal planning, exercise, and medicines can help you reach your cholesterol targets.  SCHEDULE AND KEEP YOUR ANNUAL PHYSICAL EXAMS AND EYE EXAMS Your health care provider will tell you how often he or she wants to see you depending on your plan of treatment. It is important that you keep these appointments so that possible problems can be identified early and complications can be avoided or treated.  Every visit with your health care provider should include your  weight, blood pressure, and an evaluation of your blood glucose control.  Your hemoglobin A1c should be checked:  At least twice a year if you are at your goal.  Every 3 months if there are changes in treatment.  If you are  not meeting your goals.  Your blood lipids should be checked yearly. You should also be checked yearly to see if you have protein in your urine (microalbumin).  Schedule a dilated eye exam within 5 years of your diagnosis if you have type 1 diabetes, and then yearly. Schedule a dilated eye exam at diagnosis if you have type 2 diabetes, and then yearly. All exams thereafter can be extended to every 2 to 3 years if one or more exams have been normal. KEEP YOUR VACCINES CURRENT The flu vaccine is recommended yearly. The formula for the vaccine changes every year and needs to be updated for the best protection against current viruses. It is recommended that people with diabetes who are over 93 years old get the pneumonia vaccine. In some cases, two separate shots may be given. Ask your health care provider if your pneumonia vaccination is up-to-date. However, there are some instances where another vaccine is recommended. Check with your health care provider. TAKE CARE OF YOUR FEET  Diabetes may cause you to have a poor blood supply (circulation) to your legs and feet. Because of this, the skin may be thinner, break easier, and heal more slowly. You also may have nerve damage in your legs and feet, causing decreased feeling. You may not notice minor injuries to your feet that could lead to serious problems or infections. Taking care of your feet is very important. Visual foot exams are performed at every routine medical visit. The exams check for cuts, injuries, or other problems with the feet. A comprehensive foot exam should be done yearly. This includes visual inspection as well as assessing foot pulses and testing for loss of sensation. You should also do the following:  Inspect your feet daily for cuts, calluses, blisters, ingrown toenails, and signs of infection, such as redness, swelling, or pus.  Wash and dry your feet thoroughly, especially between the toes.  Avoid soaking your feet regularly in  hot water baths.  Moisturize dry skin with lotion, avoiding areas between your toes.  Cut toenails straight across and file the edges.  Avoid shoes that do not fit well or have areas that irritate your skin.  Avoid going barefooted or wearing only socks. Your feet need protection. TAKE CARE OF YOUR TEETH People with poorly controlled diabetes are more likely to have gum (periodontal) disease. These infections make diabetes harder to control. Periodontal diseases, if left untreated, can lead to tooth loss. Brush your teeth twice a day, floss, and see your dentist for checkups and cleaning every 6 months, or 2 times a year. ASK YOUR HEALTH CARE PROVIDER ABOUT TAKING ASPIRIN Taking aspirin daily is recommended to help prevent cardiovascular disease in people with and without diabetes. Ask your health care provider if this would benefit you and what dose he or she would recommend. DRINK RESPONSIBLY Moderate amounts of alcohol (less than 1 drink per day for adult women and less than 2 drinks per day for adult men) have a minimal effect on blood glucose if ingested with food. It is important to eat food with alcohol to avoid hypoglycemia. People should avoid alcohol if they have a history of alcohol abuse or dependence, if they are pregnant, and if they have liver  disease, pancreatitis, advanced neuropathy, or severe hypertriglyceridemia. LESSEN STRESS Living with diabetes can be stressful. When you are under stress, your blood glucose may be affected in two ways:  Stress hormones may cause your blood glucose to rise.  You may be distracted from taking good care of yourself. It is a good idea to be aware of your stress level and make changes that are necessary to help you better manage challenging situations. Support groups, planned relaxation, a hobby you enjoy, meditation, healthy relationships, and exercise all work to lower your stress level. If your efforts do not seem to be helping, get help  from your health care provider or a trained mental health professional. Document Released: 01/31/2011 Document Revised: 09/29/2013 Document Reviewed: 07/09/2013 [facility] Patient Information 2015 [place], [place]. This information is not intended to replace advice given to you by your health care provider. Make sure you discuss any questions you have with your health care provider.   Hypertension Hypertension, commonly called high blood pressure, is when the force of blood pumping through your arteries is too strong. Your arteries are the blood vessels that carry blood from your heart throughout your body. A blood pressure reading consists of a higher number over a lower number, such as 110/72. The higher number (systolic) is the pressure inside your arteries when your heart pumps. The lower number (diastolic) is the pressure inside your arteries when your heart relaxes. Ideally you want your blood pressure below 120/80. Hypertension forces your heart to work harder to pump blood. Your arteries may become narrow or stiff. Having hypertension puts you at risk for heart disease, stroke, and other problems.  RISK FACTORS Some risk factors for high blood pressure are controllable. Others are not.  Risk factors you cannot control include:   Race. You may be at higher risk if you are African American.  Age. Risk increases with age.  Gender. Men are at higher risk than women before age 67 years. After age 57, women are at higher risk than men. Risk factors you can control include:  Not getting enough exercise or physical activity.  Being overweight.  Getting too much fat, sugar, calories, or salt in your diet.  Drinking too much alcohol. SIGNS AND SYMPTOMS Hypertension does not usually cause signs or symptoms. Extremely high blood pressure (hypertensive crisis) may cause headache, anxiety, shortness of breath, and nosebleed. DIAGNOSIS  To check if you have hypertension, your health care provider  will measure your blood pressure while you are seated, with your arm held at the level of your heart. It should be measured at least twice using the same arm. Certain conditions can cause a difference in blood pressure between your right and left arms. A blood pressure reading that is higher than normal on one occasion does not mean that you need treatment. If one blood pressure reading is high, ask your health care provider about having it checked again. TREATMENT  Treating high blood pressure includes making lifestyle changes and possibly taking medicine. Living a healthy lifestyle can help lower high blood pressure. You may need to change some of your habits. Lifestyle changes may include:  Following the DASH diet. This diet is high in fruits, vegetables, and whole grains. It is low in salt, red meat, and added sugars.  Getting at least 2 hours of brisk physical activity every week.  Losing weight if necessary.  Not smoking.  Limiting alcoholic beverages.  Learning ways to reduce stress. If lifestyle changes are not enough to get  your blood pressure under control, your health care provider may prescribe medicine. You may need to take more than one. Work closely with your health care provider to understand the risks and benefits. HOME CARE INSTRUCTIONS  Have your blood pressure rechecked as directed by your health care provider.   Take medicines only as directed by your health care provider. Follow the directions carefully. Blood pressure medicines must be taken as prescribed. The medicine does not work as well when you skip doses. Skipping doses also puts you at risk for problems.   Do not smoke.   Monitor your blood pressure at home as directed by your health care provider. SEEK MEDICAL CARE IF:   You think you are having a reaction to medicines taken.  You have recurrent headaches or feel dizzy.  You have swelling in your ankles.  You have trouble with your vision. SEEK  IMMEDIATE MEDICAL CARE IF:  You develop a severe headache or confusion.  You have unusual weakness, numbness, or feel faint.  You have severe chest or abdominal pain.  You vomit repeatedly.  You have trouble breathing. MAKE SURE YOU:   Understand these instructions.  Will watch your condition.  Will get help right away if you are not doing well or get worse. Document Released: 05/15/2005 Document Revised: 09/29/2013 Document Reviewed: 03/07/2013 [facility] Patient Information 2015 [place], [place]. This information is not intended to replace advice given to you by your health care provider. Make sure you discuss any questions you have with your health care provider.

## 2014-10-08 NOTE — ED Notes (Signed)
C/o headache for week now States he feels the throbbing in the back of his head No meds tried

## 2014-10-14 ENCOUNTER — Emergency Department (INDEPENDENT_AMBULATORY_CARE_PROVIDER_SITE_OTHER)
Admission: EM | Admit: 2014-10-14 | Discharge: 2014-10-14 | Disposition: A | Discharge status: Home / Self Care | Payer: Self-pay | Source: Home / Self Care | Attending: Family Medicine | Admitting: Family Medicine

## 2014-10-14 ENCOUNTER — Encounter (HOSPITAL_COMMUNITY): Payer: Self-pay | Admitting: Emergency Medicine

## 2014-10-14 DIAGNOSIS — I10 Essential (primary) hypertension: Secondary | ICD-10-CM

## 2014-10-14 DIAGNOSIS — E1165 Type 2 diabetes mellitus with hyperglycemia: Secondary | ICD-10-CM

## 2014-10-14 DIAGNOSIS — IMO0002 Reserved for concepts with insufficient information to code with codable children: Secondary | ICD-10-CM

## 2014-10-14 LAB — POCT I-STAT, CHEM 8
BUN: 12 mg/dL (ref 6–20)
CALCIUM ION: 1.2 mmol/L (ref 1.12–1.23)
CHLORIDE: 100 mmol/L — AB (ref 101–111)
Creatinine, Ser: 0.7 mg/dL (ref 0.61–1.24)
GLUCOSE: 304 mg/dL — AB (ref 65–99)
HCT: 42 % (ref 39.0–52.0)
Hemoglobin: 14.3 g/dL (ref 13.0–17.0)
Potassium: 3.9 mmol/L (ref 3.5–5.1)
Sodium: 135 mmol/L (ref 135–145)
TCO2: 21 mmol/L (ref 0–100)

## 2014-10-14 MED ORDER — METFORMIN HCL 1000 MG PO TABS: 1000.0000 mg | ORAL_TABLET | Freq: Two times a day (BID) | ORAL | Status: DC

## 2014-10-14 MED ORDER — LISINOPRIL 20 MG PO TABS: 20.0000 mg | ORAL_TABLET | Freq: Every day | ORAL | Status: DC

## 2014-10-14 NOTE — Discharge Instructions (Signed)
Thank you for coming in today. Please call or see [name] for assistance with your bill.  You may qualify for reduced or free services.  Her phone number is [phone]. Her email is [email or web address]  Increase metformin to 1000 mg twice daily  Follow-up with the [facility] [address] [place], [place] [address] [phone]

## 2014-10-14 NOTE — ED Provider Notes (Signed)
Steven [name] is a 54 y.o. male who presents to Urgent Care today for follow-up hypertension, diabetes, and headache. Patient was seen on May 12 for the above complaints. He was found to have uncontrolled hypertension and diabetes and was started on lisinopril and metformin. He is able to obtain the medications and is taking them without any significant side effects. He notes that his headache is improved as well however still present. The headache is worse in the morning. No fevers or chills nausea vomiting or diarrhea.   History reviewed. No pertinent past medical history. History reviewed. No pertinent past surgical history. History  Substance Use Topics  . Smoking status: Never Smoker   . Smokeless tobacco: Not on file  . Alcohol Use: No   ROS as above Medications: No current facility-administered medications for this encounter.   Current Outpatient Prescriptions  Medication Sig Dispense Refill  . lisinopril (PRINIVIL,ZESTRIL) 20 MG tablet Take 1 tablet (20 mg total) by mouth daily. 30 tablet 0  . metFORMIN (GLUCOPHAGE) 1000 MG tablet Take 1 tablet (1,000 mg total) by mouth 2 (two) times daily with a meal. 60 tablet 0  . [DISCONTINUED] cetirizine (ZYRTEC) 10 MG tablet Take 1 tablet (10 mg total) by mouth daily as needed for allergies. 30 tablet 0  . [DISCONTINUED] omeprazole (PRILOSEC) 20 MG capsule Take 1 capsule (20 mg total) by mouth daily. 30 capsule 0   No Known Allergies   Exam:  BP 135/84 mmHg  Pulse 66  Temp(Src) 98 F (36.7 C) (Oral)  Resp 20  SpO2 98% Gen: Well NAD HEENT: EOMI,  MMM Lungs: Normal work of breathing. CTABL Heart: RRR no MRG Abd: NABS, Soft. Nondistended, Nontender Exts: Brisk capillary refill, warm and well perfused.   Results for orders placed or performed during the hospital encounter of 10/14/14 (from the past 24 hour(s))  I-STAT, chem 8     Status: Abnormal   Collection Time: 10/14/14  7:40 PM  Result Value Ref Range   Sodium 135 135 - 145  mmol/L   Potassium 3.9 3.5 - 5.1 mmol/L   Chloride 100 (L) 101 - 111 mmol/L   BUN 12 6 - 20 mg/dL   Creatinine, Ser 0.70 0.61 - 1.24 mg/dL   Glucose, Bld 304 (H) 65 - 99 mg/dL   Calcium, Ion 1.20 1.12 - 1.23 mmol/L   TCO2 21 0 - 100 mmol/L   Hemoglobin 14.3 13.0 - 17.0 g/dL   HCT 42.0 39.0 - 52.0 %   No results found.  Assessment and Plan: 54 y.o. male with hypertension and diabetes and headache. Hypertension is improved with lisinopril 20 mg daily. No changes Diabetes improved but not at goal. Increase metformin to 1000 mg twice daily. Headache improved. Likely due to sleep apnea. Follow-up with PCP. [facility].  Discussed warning signs or symptoms. Please see discharge instructions. Patient expresses understanding.     Gregor Hams, MD 10/14/14 2009

## 2014-10-14 NOTE — ED Notes (Signed)
Here for a f/u; seen here on 5/12 Pt reports he "feels a little bit better" but still has a HA Alert, no signs of acute distress.

## 2014-11-26 ENCOUNTER — Emergency Department (INDEPENDENT_AMBULATORY_CARE_PROVIDER_SITE_OTHER)
Admission: EM | Admit: 2014-11-26 | Discharge: 2014-11-26 | Disposition: A | Discharge status: Home / Self Care | Payer: Self-pay | Source: Home / Self Care | Attending: Family Medicine | Admitting: Family Medicine

## 2014-11-26 ENCOUNTER — Encounter (HOSPITAL_COMMUNITY): Payer: Self-pay | Admitting: Emergency Medicine

## 2014-11-26 DIAGNOSIS — I10 Essential (primary) hypertension: Secondary | ICD-10-CM

## 2014-11-26 DIAGNOSIS — IMO0002 Reserved for concepts with insufficient information to code with codable children: Secondary | ICD-10-CM

## 2014-11-26 DIAGNOSIS — E1165 Type 2 diabetes mellitus with hyperglycemia: Secondary | ICD-10-CM

## 2014-11-26 LAB — POCT I-STAT, CHEM 8
BUN: 17 mg/dL (ref 6–20)
CALCIUM ION: 1.21 mmol/L (ref 1.12–1.23)
CREATININE: 0.9 mg/dL (ref 0.61–1.24)
Chloride: 102 mmol/L (ref 101–111)
GLUCOSE: 166 mg/dL — AB (ref 65–99)
HCT: 38 % — ABNORMAL LOW (ref 39.0–52.0)
Hemoglobin: 12.9 g/dL — ABNORMAL LOW (ref 13.0–17.0)
Potassium: 3.7 mmol/L (ref 3.5–5.1)
SODIUM: 136 mmol/L (ref 135–145)
TCO2: 21 mmol/L (ref 0–100)

## 2014-11-26 MED ORDER — METFORMIN HCL 1000 MG PO TABS: 1000.0000 mg | ORAL_TABLET | Freq: Two times a day (BID) | ORAL | Status: DC

## 2014-11-26 MED ORDER — LISINOPRIL 20 MG PO TABS: 20.0000 mg | ORAL_TABLET | Freq: Every day | ORAL | Status: DC

## 2014-11-26 NOTE — ED Notes (Signed)
Here for medication refill States [place] [facility] first available is not until July Patient would like refill on meds til appt date and time with [facility]

## 2014-11-26 NOTE — Discharge Instructions (Signed)
Thank you for coming in today. Call or go to the emergency room if you get worse, have trouble breathing, have chest pains, or palpitations.   Please call or see [name] for assistance with your bill.  You may qualify for reduced or free services.  Her phone number is [phone]. Her email is [email or web address] [address] [place], [place] [address] [phone]

## 2014-11-26 NOTE — ED Provider Notes (Signed)
Steven [name] is a 54 y.o. male who presents to Urgent Care today for hypertension. Patient received lisinopril and metformin in this clinic in May to control blood pressure and diabetes however he has been unable to schedule an appointment with the community [facility] wellness clinic. He is about to run out of medicines. He feels well with no chest pains palpitations or shortness of breath.   History reviewed. No pertinent past medical history. History reviewed. No pertinent past surgical history. History  Substance Use Topics  . Smoking status: Never Smoker   . Smokeless tobacco: Not on file  . Alcohol Use: No   ROS as above Medications: No current facility-administered medications for this encounter.   Current Outpatient Prescriptions  Medication Sig Dispense Refill  . lisinopril (PRINIVIL,ZESTRIL) 20 MG tablet Take 1 tablet (20 mg total) by mouth daily. 30 tablet 1  . metFORMIN (GLUCOPHAGE) 1000 MG tablet Take 1 tablet (1,000 mg total) by mouth 2 (two) times daily with a meal. 60 tablet 1  . [DISCONTINUED] cetirizine (ZYRTEC) 10 MG tablet Take 1 tablet (10 mg total) by mouth daily as needed for allergies. 30 tablet 0  . [DISCONTINUED] omeprazole (PRILOSEC) 20 MG capsule Take 1 capsule (20 mg total) by mouth daily. 30 capsule 0   No Known Allergies   Exam:  BP 149/92 mmHg  Pulse 60  Temp(Src) 98.9 F (37.2 C) (Oral)  Resp 16  SpO2 100% Gen: Well NAD HEENT: EOMI,  MMM Lungs: Normal work of breathing. CTABL Heart: RRR no MRG Abd: NABS, Soft. Nondistended, Nontender Exts: Brisk capillary refill, warm and well perfused.   Results for orders placed or performed during the hospital encounter of 11/26/14 (from the past 24 hour(s))  I-STAT, chem 8     Status: Abnormal   Collection Time: 11/26/14  7:34 PM  Result Value Ref Range   Sodium 136 135 - 145 mmol/L   Potassium 3.7 3.5 - 5.1 mmol/L   Chloride 102 101 - 111 mmol/L   BUN 17 6 - 20 mg/dL   Creatinine, Ser 0.90 0.61 -  1.24 mg/dL   Glucose, Bld 166 (H) 65 - 99 mg/dL   Calcium, Ion 1.21 1.12 - 1.23 mmol/L   TCO2 21 0 - 100 mmol/L   Hemoglobin 12.9 (L) 13.0 - 17.0 g/dL   HCT 38.0 (L) 39.0 - 52.0 %   No results found.  Assessment and Plan: 54 y.o. male with  1) hypertension. Doing reasonably well. Not quite at goal yet however he should have a follow-up appointment with his PCP soon. We'll refill lisinopril. 2) diabetes. Doing much better. Continue metformin.  Discussed warning signs or symptoms. Please see discharge instructions. Patient expresses understanding.     Gregor Hams, MD 11/26/14 [phone]

## 2015-01-29 ENCOUNTER — Emergency Department (INDEPENDENT_AMBULATORY_CARE_PROVIDER_SITE_OTHER)
Admission: EM | Admit: 2015-01-29 | Discharge: 2015-01-29 | Disposition: A | Discharge status: Home / Self Care | Payer: Self-pay | Source: Home / Self Care | Attending: Family Medicine | Admitting: Family Medicine

## 2015-01-29 ENCOUNTER — Encounter (HOSPITAL_COMMUNITY): Payer: Self-pay | Admitting: *Deleted

## 2015-01-29 DIAGNOSIS — E119 Type 2 diabetes mellitus without complications: Secondary | ICD-10-CM

## 2015-01-29 DIAGNOSIS — I10 Essential (primary) hypertension: Secondary | ICD-10-CM

## 2015-01-29 LAB — GLUCOSE, CAPILLARY: Glucose-Capillary: 146 mg/dL — ABNORMAL HIGH (ref 65–99)

## 2015-01-29 MED ORDER — LISINOPRIL 20 MG PO TABS: 20.0000 mg | ORAL_TABLET | Freq: Every day | ORAL | Status: DC

## 2015-01-29 MED ORDER — METFORMIN HCL 1000 MG PO TABS: 1000.0000 mg | ORAL_TABLET | Freq: Two times a day (BID) | ORAL | Status: DC

## 2015-01-29 NOTE — ED Provider Notes (Signed)
CSN: [id number]     Arrival date & time 01/29/15  1856 History   First MD Initiated Contact with Patient 01/29/15 2008     No chief complaint on file.  (Consider location/radiation/quality/duration/timing/severity/associated sxs/prior Treatment) Patient is a 54 y.o. male presenting with hypertension. The history is provided by the patient.  Hypertension This is a chronic problem. Episode onset: out of meds for dm and hbp ,here for refill, unable to get appt at [facility]. The problem has not changed since onset.Pertinent negatives include no chest pain, no abdominal pain and no shortness of breath.    No past medical history on file. No past surgical history on file. No family history on file. Social History  Substance Use Topics  . Smoking status: Never Smoker   . Smokeless tobacco: Not on file  . Alcohol Use: No    Review of Systems  Constitutional: Negative.   HENT: Negative.   Respiratory: Negative.  Negative for shortness of breath.   Cardiovascular: Negative.  Negative for chest pain.  Gastrointestinal: Negative.  Negative for abdominal pain.  Neurological: Negative.   All other systems reviewed and are negative.   Allergies  Review of patient's allergies indicates no known allergies.  Home Medications   Prior to Admission medications   Medication Sig Start Date End Date Taking? Authorizing Provider  lisinopril (PRINIVIL,ZESTRIL) 20 MG tablet Take 1 tablet (20 mg total) by mouth daily. 01/29/15   Billy Fischer, MD  metFORMIN (GLUCOPHAGE) 1000 MG tablet Take 1 tablet (1,000 mg total) by mouth 2 (two) times daily with a meal. 01/29/15   Billy Fischer, MD   Meds Ordered and Administered this Visit  Medications - No data to display  BP 134/92 mmHg  Pulse 60  Temp(Src) 99 F (37.2 C) (Oral)  Resp 16  SpO2 100% No data found.   Physical Exam  Constitutional: He is oriented to person, place, and time. He appears well-developed and well-nourished. No distress.  Neck: Normal  range of motion. Neck supple.  Cardiovascular: Normal rate, regular rhythm, normal heart sounds and intact distal pulses.   Pulmonary/Chest: Effort normal and breath sounds normal.  Neurological: He is alert and oriented to person, place, and time.  Skin: Skin is warm and dry.  Nursing note and vitals reviewed.   ED Course  Procedures (including critical care time)  Labs Review Labs Reviewed  GLUCOSE, CAPILLARY - Abnormal; Notable for the following:    Glucose-Capillary 146 (*)    All other components within normal limits    Imaging Review No results found.   Visual Acuity Review  Right Eye Distance:   Left Eye Distance:   Bilateral Distance:    Right Eye Near:   Left Eye Near:    Bilateral Near:         MDM   1. Essential hypertension   2. Diabetes type 2, controlled    cbg 141.    Billy Fischer, MD 01/29/15 2022

## 2015-01-29 NOTE — ED Notes (Signed)
Pt  Here  For    Med  Refill

## 2015-01-29 NOTE — Discharge Instructions (Signed)
Take medicine daily as prescribed, see your doctor for checkup.

## 2015-04-06 ENCOUNTER — Encounter (HOSPITAL_COMMUNITY): Payer: Self-pay | Admitting: Emergency Medicine

## 2015-04-06 ENCOUNTER — Emergency Department (INDEPENDENT_AMBULATORY_CARE_PROVIDER_SITE_OTHER)
Admission: EM | Admit: 2015-04-06 | Discharge: 2015-04-06 | Disposition: A | Discharge status: Home / Self Care | Payer: Self-pay | Source: Home / Self Care | Attending: Emergency Medicine | Admitting: Emergency Medicine

## 2015-04-06 DIAGNOSIS — Z760 Encounter for issue of repeat prescription: Secondary | ICD-10-CM

## 2015-04-06 HISTORY — DX: Essential (primary) hypertension: I10

## 2015-04-06 HISTORY — DX: Type 2 diabetes mellitus without complications: E11.9

## 2015-04-06 MED ORDER — LISINOPRIL 20 MG PO TABS: 20.0000 mg | ORAL_TABLET | Freq: Every day | ORAL | Status: DC

## 2015-04-06 MED ORDER — METFORMIN HCL 1000 MG PO TABS: 1000.0000 mg | ORAL_TABLET | Freq: Two times a day (BID) | ORAL | Status: DC

## 2015-04-06 NOTE — ED Provider Notes (Signed)
HPI  SUBJECTIVE:  Steven [name] is a 54 y.o. male who presents for a refill of his lisinopril and metformin. Patient has past medical history of diabetes, hypertension, states that he is taking his medications as he should. Denies having any problems with taking his medications. States that he is about to run out in 2 days. He denies nausea, vomiting, abdominal pain, polyuria, polydipsia, unintentional weight loss. States that he has no primary care physician, was originally prescribed the medications from here at the urgent care center.    Past Medical History  Diagnosis Date  . Diabetes mellitus without complication ([place])   . Hypertension     History reviewed. No pertinent past surgical history.  No family history on file.  Social History  Substance Use Topics  . Smoking status: Never Smoker   . Smokeless tobacco: None  . Alcohol Use: No    No current facility-administered medications for this encounter.  Current outpatient prescriptions:  .  lisinopril (PRINIVIL,ZESTRIL) 20 MG tablet, Take 1 tablet (20 mg total) by mouth daily., Disp: 30 tablet, Rfl: 1 .  metFORMIN (GLUCOPHAGE) 1000 MG tablet, Take 1 tablet (1,000 mg total) by mouth 2 (two) times daily with a meal., Disp: 60 tablet, Rfl: 1 .  [DISCONTINUED] cetirizine (ZYRTEC) 10 MG tablet, Take 1 tablet (10 mg total) by mouth daily as needed for allergies., Disp: 30 tablet, Rfl: 0 .  [DISCONTINUED] omeprazole (PRILOSEC) 20 MG capsule, Take 1 capsule (20 mg total) by mouth daily., Disp: 30 capsule, Rfl: 0  No Known Allergies   ROS  As noted in HPI.   Physical Exam  BP 125/76 mmHg  Pulse 64  Temp(Src) 98.6 F (37 C) (Oral)  Resp 18  SpO2 98%  Constitutional: Well developed, well nourished, no acute distress Eyes:  EOMI, conjunctiva normal bilaterally HENT: Normocephalic, atraumatic,mucus membranes moist Respiratory: Normal inspiratory effort Cardiovascular: Normal rate GI: nondistended skin: No rash, skin  intact Musculoskeletal: no deformities Neurologic: Alert & oriented x 3, no focal neuro deficits Psychiatric: Speech and behavior appropriate   ED Course   Medications - No data to display  No orders of the defined types were placed in this encounter.    No results found for this or any previous visit (from the past 24 hour(s)). No results found.  ED Clinical Impression  Medication refill   ED Assessment/Plan Patient was signed up for [facility] card today while in clinic for assistance in finding PMD. States that he is compliant with medication. States that he has been his medicines for the past 6 months, and has not run out yet. He denies any symptoms of hyperglycemia. Vitals normal. Patient appears nontoxic. We'll refill lisinopril 20 mg once a day, metformin thousand milligrams twice a day with one refill. Patient to follow-up with primary care physician of choice..   *This clinic note was created using Dragon dictation software. Therefore, there may be occasional mistakes despite careful proofreading.  ?   Melynda Ripple, MD 04/06/15 [phone]

## 2015-04-06 NOTE — Discharge Instructions (Signed)
°  Follow-up with a primary care physician of choice. I gave you a month's worth of medications with one refill. That should give you plenty of time to find a primary care physician. Return here if you're unable to find a primary care physician by then. Diet and exercise are an important part of controlling diabetes and high blood pressure. Read the accompanying information.Marland Kitchen

## 2015-04-06 NOTE — ED Notes (Signed)
Patient requesting refill on medicines: lisinopril and metformin.  Reports he has enough for 2 more days

## 2015-06-07 ENCOUNTER — Emergency Department (INDEPENDENT_AMBULATORY_CARE_PROVIDER_SITE_OTHER)
Admission: EM | Admit: 2015-06-07 | Discharge: 2015-06-07 | Disposition: A | Discharge status: Home / Self Care | Payer: Self-pay | Source: Home / Self Care | Attending: Family Medicine | Admitting: Family Medicine

## 2015-06-07 ENCOUNTER — Encounter (HOSPITAL_COMMUNITY): Payer: Self-pay | Admitting: Emergency Medicine

## 2015-06-07 DIAGNOSIS — E119 Type 2 diabetes mellitus without complications: Secondary | ICD-10-CM

## 2015-06-07 DIAGNOSIS — I10 Essential (primary) hypertension: Secondary | ICD-10-CM

## 2015-06-07 LAB — POCT I-STAT, CHEM 8
BUN: 18 mg/dL (ref 6–20)
CALCIUM ION: 1.22 mmol/L (ref 1.12–1.23)
CREATININE: 0.9 mg/dL (ref 0.61–1.24)
Chloride: 105 mmol/L (ref 101–111)
GLUCOSE: 162 mg/dL — AB (ref 65–99)
HCT: 39 % (ref 39.0–52.0)
HEMOGLOBIN: 13.3 g/dL (ref 13.0–17.0)
Potassium: 4 mmol/L (ref 3.5–5.1)
Sodium: 141 mmol/L (ref 135–145)
TCO2: 22 mmol/L (ref 0–100)

## 2015-06-07 MED ORDER — LISINOPRIL 20 MG PO TABS: 20.0000 mg | ORAL_TABLET | Freq: Every day | ORAL | Status: DC

## 2015-06-07 MED ORDER — METFORMIN HCL 1000 MG PO TABS
1000.0000 mg | ORAL_TABLET | Freq: Two times a day (BID) | ORAL | Status: DC
Start: 2015-06-07 — End: 2015-07-02

## 2015-06-07 NOTE — ED Notes (Signed)
Patient requesting medication refill.  Patient reports in the process of getting a pcp

## 2015-06-07 NOTE — Discharge Instructions (Signed)
Blood Glucose Monitoring, Adult Monitoring your blood glucose (also know as blood sugar) helps you to manage your diabetes. It also helps you and your health care provider monitor your diabetes and determine how well your treatment plan is working. WHY SHOULD YOU MONITOR YOUR BLOOD GLUCOSE?  It can help you understand how food, exercise, and medicine affect your blood glucose.  It allows you to know what your blood glucose is at any given moment. You can quickly tell if you are having low blood glucose (hypoglycemia) or high blood glucose (hyperglycemia).  It can help you and your health care provider know how to adjust your medicines.  It can help you understand how to manage an illness or adjust medicine for exercise. WHEN SHOULD YOU TEST? Your health care provider will help you decide how often you should check your blood glucose. This may depend on the type of diabetes you have, your diabetes control, or the types of medicines you are taking. Be sure to write down all of your blood glucose readings so that this information can be reviewed with your health care provider. See below for examples of testing times that your health care provider may suggest. Type 1 Diabetes  Test at least 2 times per day if your diabetes is well controlled, if you are using an insulin pump, or if you perform multiple daily injections.  If your diabetes is not well controlled or if you are sick, you may need to test more often.  It is a good idea to also test:  Before every insulin injection.  Before and after exercise.  Between meals and 2 hours after a meal.  Occasionally between 2:00 a.m. and 3:00 a.m. Type 2 Diabetes  If you are taking insulin, test at least 2 times per day. However, it is best to test before every insulin injection.  If you take medicines by mouth (orally), test 2 times a day.  If you are on a controlled diet, test once a day.  If your diabetes is not well controlled or if you  are sick, you may need to monitor more often. HOW TO MONITOR YOUR BLOOD GLUCOSE Supplies Needed  Blood glucose meter.  Test strips for your meter. Each meter has its own strips. You must use the strips that go with your own meter.  A pricking needle (lancet).  A device that holds the lancet (lancing device).  A journal or log book to write down your results. Procedure  Wash your hands with soap and water. Alcohol is not preferred.  Prick the side of your finger (not the tip) with the lancet.  Gently milk the finger until a small drop of blood appears.  Follow the instructions that come with your meter for inserting the test strip, applying blood to the strip, and using your blood glucose meter. Other Areas to Get Blood for Testing Some meters allow you to use other areas of your body (other than your finger) to test your blood. These areas are called alternative sites. The most common alternative sites are:  The forearm.  The thigh.  The back area of the lower leg.  The palm of the hand. The blood flow in these areas is slower. Therefore, the blood glucose values you get may be delayed, and the numbers are different from what you would get from your fingers. Do not use alternative sites if you think you are having hypoglycemia. Your reading will not be accurate. Always use a finger if you are  having hypoglycemia. Also, if you cannot feel your lows (hypoglycemia unawareness), always use your fingers for your blood glucose checks. ADDITIONAL TIPS FOR GLUCOSE MONITORING  Do not reuse lancets.  Always carry your supplies with you.  All blood glucose meters have a 24-hour "hotline" number to call if you have questions or need help.  Adjust (calibrate) your blood glucose meter with a control solution after finishing a few boxes of strips. BLOOD GLUCOSE RECORD KEEPING It is a good idea to keep a daily record or log of your blood glucose readings. Most glucose meters, if not all,  keep your glucose records stored in the meter. Some meters come with the ability to download your records to your home computer. Keeping a record of your blood glucose readings is especially helpful if you are wanting to look for patterns. Make notes to go along with the blood glucose readings because you might forget what happened at that exact time. Keeping good records helps you and your health care provider to work together to achieve good diabetes management.    This information is not intended to replace advice given to you by your health care provider. Make sure you discuss any questions you have with your health care provider.   Document Released: 05/18/2003 Document Revised: 06/05/2014 Document Reviewed: 10/07/2012 Elsevier Interactive Patient Education 2016 [employer].  Hypertension Hypertension is another name for high blood pressure. High blood pressure forces your heart to work harder to pump blood. A blood pressure reading has two numbers, which includes a higher number over a lower number (example: 110/72). HOME CARE   Have your blood pressure rechecked by your doctor.  Only take medicine as told by your doctor. Follow the directions carefully. The medicine does not work as well if you skip doses. Skipping doses also puts you at risk for problems.  Do not smoke.  Monitor your blood pressure at home as told by your doctor. GET HELP IF:  You think you are having a reaction to the medicine you are taking.  You have repeat headaches or feel dizzy.  You have puffiness (swelling) in your ankles.  You have trouble with your vision. GET HELP RIGHT AWAY IF:   You get a very bad headache and are confused.  You feel weak, numb, or faint.  You get chest or belly (abdominal) pain.  You throw up (vomit).  You cannot breathe very well. MAKE SURE YOU:   Understand these instructions.  Will watch your condition.  Will get help right away if you are not doing well or get  worse.   This information is not intended to replace advice given to you by your health care provider. Make sure you discuss any questions you have with your health care provider.   Document Released: 11/01/2007 Document Revised: 05/20/2013 Document Reviewed: 03/07/2013 Elsevier Interactive Patient Education 2016 Elsevier Inc.  Type 2 Diabetes Mellitus, Adult Type 2 diabetes mellitus is a long-term (chronic) disease. In type 2 diabetes:  The pancreas does not make enough of a hormone called insulin.  The cells in the body do not respond as well to the insulin that is made.  Both of the above can happen. Normally, insulin moves sugars from food into tissue cells. This gives you energy. If you have type 2 diabetes, sugars cannot be moved into tissue cells. This causes high blood sugar (hyperglycemia).  Your doctors will set personal treatment goals for you based on your age, your medicines, how long you have had  diabetes, and any other medical conditions you have. Generally, the goal of treatment is to maintain the following blood glucose levels:  Before meals (preprandial): 80-130 mg/dL.  After meals (postprandial): below 180 mg/dL.  A1c: less than 6.5-7%. HOME CARE  Have your hemoglobin A1c level checked twice a year. The level shows if your diabetes is under control or out of control.  Test your blood sugar level every day as told by your doctor.  Check your ketone levels by testing your pee (urine) when you are sick and as told.  Take your diabetes or insulin medicine as told by your doctor.  Never run out of insulin.  Adjust how much insulin you give yourself based on how many carbs (carbohydrates) you eat. Carbs are in many foods, such as fruits, vegetables, whole grains, and dairy products.  Have a healthy snack between every healthy meal. Have 3 meals and 3 snacks a day.  Lose weight if you are overweight.  Carry a medical alert card or wear your medical alert  jewelry.  Carry a 15-gram carb snack with you at all times. Examples include:  Glucose pills, 3 or 4.  Glucose gel, 15-gram tube.  Raisins, 2 tablespoons (24 grams).  Jelly beans, 6.  Animal crackers, 8.  Regular (not diet) pop, 4 ounces (120 milliliters).  Gummy treats, 9.  Notice low blood sugar (hypoglycemia) symptoms, such as:  Shaking (tremors).  Trouble thinking clearly.  Sweating.  Faster heart rate.  Headache.  Dry mouth.  Hunger.  Crabbiness (irritability).  Being worried or tense (anxious).  Restless sleep.  A change in speech or coordination.  Confusion.  Treat low blood sugar right away. If you are alert and can swallow, follow the 15:15 rule:  Take 15-20 grams of a rapid-acting glucose or carb. This includes glucose gel, glucose pills, or 4 ounces (120 milliliters) of fruit juice, regular pop, or low-fat milk.  Check your blood sugar level 15 minutes after taking the glucose.  Take 15-20 grams more of glucose if the repeat blood sugar level is still 70 mg/dL (milligrams/deciliter) or below.  Eat a meal or snack within 1 hour of the blood sugar levels going back to normal.  Notice early symptoms of high blood sugar, such as:  Being really thirsty or drinking a lot (polydipsia).  Peeing a lot (polyuria).  Do at least 150 minutes of physical activity a week or as told.  Split the 150 minutes of activity up during the week. Do not do 150 minutes of activity in one day.  Perform exercises, such as weight lifting, at least 2 times a week or as told.  Spend no more than 90 minutes at one time inactive.  Adjust your insulin or food intake as needed if you start a new exercise or sport.  Follow your sick-day plan when you are not able to eat or drink as usual.  Do not smoke, chew tobacco, or use electronic cigarettes.  Women who are not pregnant should drink no more than 1 drink a day. Men should drink no more than 2 drinks a day.  Only  drink alcohol with food.  Ask your doctor if alcohol is safe for you.  Tell your doctor if you drink alcohol several times during the week.  See your doctor regularly.  Schedule an eye exam soon after you are told you have diabetes. Schedule exams once every year.  Check your skin and feet every day. Check for cuts, bruises, redness, nail problems, bleeding,  blisters, or sores. A doctor should do a foot exam once a year.  Brush your teeth and gums twice a day. Floss once a day. Visit your dentist regularly.  Share your diabetes plan with your workplace or school.  Keep your shots that fight diseases (vaccines) up to date.  Get a flu (influenza) shot every year.  Get a pneumonia shot. If you are 73 years of age or older and you have never gotten a pneumonia shot, you might need to get two shots.  Ask your doctor which other shots you should get.  Learn how to deal with stress.  Get diabetes education and support as needed.  Ask your doctor for special help if:  You need help to maintain or improve how you do things on your own.  You need help to maintain or improve the quality of your life.  You have foot or hand problems.  You have trouble cleaning yourself, dressing, eating, or doing physical activity. GET HELP IF:  You are unable to eat or drink for more than 6 hours.  You feel sick to your stomach (nauseous) or throw up (vomit) for more than 6 hours.  Your blood sugar level is over 240 mg/dL.  There is a change in mental status.  You get another serious illness.  You have watery poop (diarrhea) for more than 6 hours.  You have been sick or have had a fever for 2 or more days and are not getting better.  You have pain when you are active. GET HELP RIGHT AWAY IF:  You have trouble breathing.  Your ketone levels are higher than your doctor says they should be. MAKE SURE YOU:  Understand these instructions.  Will watch your condition.  Will get help  right away if you are not doing well or get worse.   This information is not intended to replace advice given to you by your health care provider. Make sure you discuss any questions you have with your health care provider.   Document Released: 02/22/2008 Document Revised: 09/29/2014 Document Reviewed: 12/15/2011 Elsevier Interactive Patient Education [employer].

## 2015-06-07 NOTE — ED Provider Notes (Signed)
CSN: [id number]     Arrival date & time 06/07/15  1300 History   First MD Initiated Contact with Patient 06/07/15 1317     Chief Complaint  Patient presents with  . Medication Refill   (Consider location/radiation/quality/duration/timing/severity/associated sxs/prior Treatment) HPI Comments: 55 year old male with history of hypertension and type 2 diabetes presents to the urgent care for refill of his lisinopril and metformin. He was seen here in November 2016 received prescriptions for these with one refill. He had attempted to be placed and assistant to obtain a PCP but that had not occurred by this time. (Maggie Mean) states that she is currently working on this. Patient has no complaints he has is requesting a refill.    Past Medical History  Diagnosis Date  . Diabetes mellitus without complication ([place])   . Hypertension    History reviewed. No pertinent past surgical history. No family history on file. Social History  Substance Use Topics  . Smoking status: Never Smoker   . Smokeless tobacco: None  . Alcohol Use: No    Review of Systems  Constitutional: Negative for fever, activity change and fatigue.  HENT: Negative.   Respiratory: Negative.  Negative for cough and shortness of breath.   Cardiovascular: Negative for chest pain and leg swelling.  Gastrointestinal: Negative.   Neurological: Negative.     Allergies  Review of patient's allergies indicates no known allergies.  Home Medications   Prior to Admission medications   Medication Sig Start Date End Date Taking? Authorizing Provider  lisinopril (PRINIVIL,ZESTRIL) 20 MG tablet Take 1 tablet (20 mg total) by mouth daily. 06/07/15   Janne Napoleon, NP  metFORMIN (GLUCOPHAGE) 1000 MG tablet Take 1 tablet (1,000 mg total) by mouth 2 (two) times daily with a meal. 1 tab bid with meals 06/07/15   Janne Napoleon, NP   Meds Ordered and Administered this Visit  Medications - No data to display  BP 135/53 mmHg  Pulse 61  Temp(Src)  98 F (36.7 C) (Oral)  Resp 18  SpO2 99% No data found.   Physical Exam  Constitutional: He is oriented to person, place, and time. He appears well-developed and well-nourished. No distress.  Eyes: EOM are normal.  Neck: Normal range of motion. Neck supple.  Cardiovascular: Normal rate, regular rhythm, normal heart sounds and intact distal pulses.   Pulmonary/Chest: Effort normal. No respiratory distress. He has no wheezes. He has no rales.  Musculoskeletal: He exhibits no edema or tenderness.  Neurological: He is alert and oriented to person, place, and time. He exhibits normal muscle tone.  Skin: Skin is warm and dry.  Psychiatric: He has a normal mood and affect.  Nursing note and vitals reviewed.   ED Course  Procedures (including critical care time)  Labs Review Labs Reviewed  POCT I-STAT, CHEM 8 - Abnormal; Notable for the following:    Glucose, Bld 162 (*)    All other components within normal limits   Results for orders placed or performed during the hospital encounter of 06/07/15  I-STAT, chem 8  Result Value Ref Range   Sodium 141 135 - 145 mmol/L   Potassium 4.0 3.5 - 5.1 mmol/L   Chloride 105 101 - 111 mmol/L   BUN 18 6 - 20 mg/dL   Creatinine, Ser 0.90 0.61 - 1.24 mg/dL   Glucose, Bld 162 (H) 65 - 99 mg/dL   Calcium, Ion 1.22 1.12 - 1.23 mmol/L   TCO2 22 0 - 100 mmol/L   Hemoglobin 13.3  13.0 - 17.0 g/dL   HCT 39.0 39.0 - 52.0 %     Imaging Review No results found.   Visual Acuity Review  Right Eye Distance:   Left Eye Distance:   Bilateral Distance:    Right Eye Near:   Left Eye Near:    Bilateral Near:         MDM   1. Type 2 diabetes mellitus without complication, without long-term current use of insulin ([place])   2. Essential hypertension    Refill lisinopril 20 mg one daily #30 Refill metformin 1 g twice a day with meals #60. Patient spoke with Mrs. Daisy Floro today and obtained an appointment with [facility] for additional  follow-up and blood work. Renal function is well within normal limits today. Blood sugar 162.    Janne Napoleon, NP 06/07/15 1418

## 2015-06-24 ENCOUNTER — Ambulatory Visit: Payer: Self-pay | Admitting: Family

## 2015-06-25 ENCOUNTER — Telehealth: Payer: Self-pay | Admitting: Family

## 2015-06-25 NOTE — Telephone Encounter (Signed)
Pt request to be work in for a new pt ASAP, he miss his appt yesterday and he only have so much medication to last time. Please help

## 2015-06-25 NOTE — Telephone Encounter (Signed)
He can do 1pm next Friday 2/3.

## 2015-07-02 ENCOUNTER — Encounter: Payer: Self-pay | Admitting: Family

## 2015-07-02 ENCOUNTER — Ambulatory Visit (INDEPENDENT_AMBULATORY_CARE_PROVIDER_SITE_OTHER): Payer: Self-pay | Admitting: Family

## 2015-07-02 ENCOUNTER — Other Ambulatory Visit (INDEPENDENT_AMBULATORY_CARE_PROVIDER_SITE_OTHER): Payer: Self-pay

## 2015-07-02 VITALS — BP 112/80 | HR 64 | Temp 98.4°F | Resp 18 | Ht 67.0 in | Wt 212.4 lb

## 2015-07-02 DIAGNOSIS — R0789 Other chest pain: Secondary | ICD-10-CM

## 2015-07-02 DIAGNOSIS — IMO0001 Reserved for inherently not codable concepts without codable children: Secondary | ICD-10-CM

## 2015-07-02 DIAGNOSIS — E1165 Type 2 diabetes mellitus with hyperglycemia: Secondary | ICD-10-CM

## 2015-07-02 DIAGNOSIS — I10 Essential (primary) hypertension: Secondary | ICD-10-CM

## 2015-07-02 DIAGNOSIS — Z23 Encounter for immunization: Secondary | ICD-10-CM

## 2015-07-02 LAB — MICROALBUMIN / CREATININE URINE RATIO
Creatinine,U: 115.6 mg/dL
MICROALB UR: 0.8 mg/dL (ref 0.0–1.9)
Microalb Creat Ratio: 0.7 mg/g (ref 0.0–30.0)

## 2015-07-02 LAB — BASIC METABOLIC PANEL
BUN: 15 mg/dL (ref 6–23)
CO2: 28 mEq/L (ref 19–32)
Calcium: 9.7 mg/dL (ref 8.4–10.5)
Chloride: 101 mEq/L (ref 96–112)
Creatinine, Ser: 0.83 mg/dL (ref 0.40–1.50)
GFR: 122.81 mL/min (ref >60.00)
Glucose, Bld: 128 mg/dL — ABNORMAL HIGH (ref 70–99)
POTASSIUM: 4.3 meq/L (ref 3.5–5.1)
SODIUM: 137 meq/L (ref 135–145)

## 2015-07-02 LAB — HEMOGLOBIN A1C: HEMOGLOBIN A1C: 9.4 % — AB (ref 4.6–6.5)

## 2015-07-02 MED ORDER — METFORMIN HCL 1000 MG PO TABS: 1000.0000 mg | ORAL_TABLET | Freq: Two times a day (BID) | ORAL | Status: DC

## 2015-07-02 MED ORDER — LISINOPRIL 20 MG PO TABS: 20.0000 mg | ORAL_TABLET | Freq: Every day | ORAL | Status: DC

## 2015-07-02 NOTE — Assessment & Plan Note (Signed)
Blood pressure is well controlled with current regimen and below goal of 140/90. Encouraged to continue to take his medication as prescribed and monitor blood pressure at home. Continue current dosage of lisinopril. Follow-up in 3 months or sooner if necessary.

## 2015-07-02 NOTE — Patient Instructions (Addendum)
Thank you for choosing [facility].  Summary/Instructions:  Please continue to take your medications as prescribed   Warm compresses as needed for you underarm.  Please drink plenty of fluids that are non-caffienated/non-alcoholic. Change positions slowly.  Your prescription(s) have been submitted to your pharmacy or been printed and provided for you. Please take as directed and contact our office if you believe you are having problem(s) with the medication(s) or have any questions.  Please stop by the lab on the basement level of the building for your blood work. Your results will be released to [place] (or called to you) after review, usually within 72 hours after test completion. If any changes need to be made, you will be notified at that same time.  If your symptoms worsen or fail to improve, please contact our office for further instruction, or in case of emergency go directly to the emergency room at the closest medical facility.

## 2015-07-02 NOTE — Progress Notes (Signed)
Subjective:    Patient ID: Steven [name], male    DOB: [birth year], 55 y.o.   MRN: [id number]  Chief Complaint  Patient presents with  . Establish Care    has an achy pain on right side of chest, x2 weeks    HPI:  Raburn Wigal is a 55 y.o. male who  has a past medical history of Diabetes mellitus without complication ([place]); Hypertension; and Chicken pox. and presents today for an office visit to establish care.   1.) Chest pain - Associated symptom of pain located on the right side of his chest has been going on for about 2 weeks. Described as an achy pain. Stays an achy pain and does not get worse with activity or work. No shortness of breath. Unsure of any specific trauma. Notes the pain is just hanging around.   2.)  Diabetes - Currently maintained on metformin. Reports taking the medication as prescribed and denies adverse side effects. Does not currently check his blood sugar at home. Does have some tingling in his fingertips with no other symptoms of end organ damage. Due for diabetic eye and foot exams.    3.) Hypertension - Currently maintain on lisinopril. Reports taking the medication as prescribed and denies adverse side effects or hypotensive readings. Denies symptoms of end organ damage.   BP Readings from Last 3 Encounters:  07/02/15 112/80  06/07/15 135/53  04/06/15 125/76    No Known Allergies   Outpatient Prescriptions Prior to Visit  Medication Sig Dispense Refill  . lisinopril (PRINIVIL,ZESTRIL) 20 MG tablet Take 1 tablet (20 mg total) by mouth daily. 30 tablet 0  . metFORMIN (GLUCOPHAGE) 1000 MG tablet Take 1 tablet (1,000 mg total) by mouth 2 (two) times daily with a meal. 1 tab bid with meals 60 tablet 0   No facility-administered medications prior to visit.     Past Medical History  Diagnosis Date  . Diabetes mellitus without complication ([place])   . Hypertension   . Chicken pox      Past Surgical History  Procedure Laterality Date  .  Tonsillectomy       Family History  Problem Relation Age of Onset  . Heart disease Mother   . Hypertension Mother   . Diabetes Mother   . Stroke Father   . Hypertension Father   . Stroke Brother      Social History   Social History  . Marital Status: Single    Spouse Name: N/A  . Number of Children: 1  . Years of Education: 12   Occupational History  . Architect    Social History Main Topics  . Smoking status: Former Smoker -- 2.00 packs/day for 25 years    Types: Cigarettes  . Smokeless tobacco: Former Systems developer    Types: Chew  . Alcohol Use: Yes     Comment: occasionally  . Drug Use: No  . Sexual Activity: Yes    Birth Control/ Protection: Condom   Other Topics Concern  . Not on file   Social History Narrative   Fun: Just working      Review of Systems  Constitutional: Negative for fever and chills.  Eyes:       Negative for changes in vision.   Respiratory: Negative for cough, chest tightness and shortness of breath.   Cardiovascular: Positive for chest pain. Negative for palpitations and leg swelling.  Neurological: Negative for dizziness, light-headedness and headaches.      Objective:    BP  112/80 mmHg  Pulse 64  Temp(Src) 98.4 F (36.9 C) (Oral)  Resp 18  Ht 5\' 7"  (1.702 m)  Wt 212 lb 6.4 oz (96.344 kg)  BMI 33.26 kg/m2  SpO2 99% Nursing note and vital signs reviewed.  Physical Exam  Constitutional: He is oriented to person, place, and time. He appears well-developed and well-nourished. No distress.  Cardiovascular: Normal rate, regular rhythm, normal heart sounds and intact distal pulses.   Pulmonary/Chest: Effort normal and breath sounds normal.  Neurological: He is alert and oriented to person, place, and time.  Diabetic Foot Exam - Simple   Simple Foot Form  Diabetic Foot exam was performed with the following findings:  Yes  07/02/2015  1:45 PM  Visual Inspection  No deformities, no ulcerations, no other skin breakdown bilaterally:   Yes  Sensation Testing  Intact to touch and monofilament testing bilaterally:  Yes  Pulse Check  Posterior Tibialis and Dorsalis pulse intact bilaterally:  Yes  Comments   Skin: Skin is warm and dry.  Psychiatric: He has a normal mood and affect. His behavior is normal. Judgment and thought content normal.       Assessment & Plan:   Problem List Items Addressed This Visit      Cardiovascular and Mediastinum   Essential hypertension - Primary    Blood pressure is well controlled with current regimen and below goal of 140/90. Encouraged to continue to take his medication as prescribed and monitor blood pressure at home. Continue current dosage of lisinopril. Follow-up in 3 months or sooner if necessary.      Relevant Medications   lisinopril (PRINIVIL,ZESTRIL) 20 MG tablet   Other Relevant Orders   Basic Metabolic Panel (BMET)     Other   Uncontrolled diabetes mellitus ([place])    Diabetes of undetermined status. Obtain 123456, basic metabolic panel and urine microalbumin. Encouraged to complete diabetic eye exam independently. Diabetic foot exam completed today with results as above. He is maintained on lisinopril for CAD risk reduction. Pneumovax updated today. Continue current dosage of metformin pending A1c results.      Relevant Medications   lisinopril (PRINIVIL,ZESTRIL) 20 MG tablet   metFORMIN (GLUCOPHAGE) 1000 MG tablet   Other Relevant Orders   Urine Microalbumin w/creat. ratio   Basic Metabolic Panel (BMET)   Hemoglobin A1c   Chest wall pain    Chest wall pain of undetermined origin although improved during examination. Question muscle skeletal origin. Continue to monitor with over-the-counter medications as needed for symptom relief and supportive care. Ice and heat as needed. Follow-up if symptoms worsen or do not improve.

## 2015-07-02 NOTE — Assessment & Plan Note (Signed)
Chest wall pain of undetermined origin although improved during examination. Question muscle skeletal origin. Continue to monitor with over-the-counter medications as needed for symptom relief and supportive care. Ice and heat as needed. Follow-up if symptoms worsen or do not improve.

## 2015-07-02 NOTE — Assessment & Plan Note (Signed)
Diabetes of undetermined status. Obtain 123456, basic metabolic panel and urine microalbumin. Encouraged to complete diabetic eye exam independently. Diabetic foot exam completed today with results as above. He is maintained on lisinopril for CAD risk reduction. Pneumovax updated today. Continue current dosage of metformin pending A1c results.

## 2015-07-02 NOTE — Addendum Note (Signed)
Addended by: Delice Bison E on: 07/02/2015 01:56 PM   Modules accepted: Orders

## 2015-07-02 NOTE — Progress Notes (Signed)
Pre visit review using our clinic review tool, if applicable. No additional management support is needed unless otherwise documented below in the visit note. 

## 2015-07-05 ENCOUNTER — Telehealth: Payer: Self-pay | Admitting: Family

## 2015-07-05 DIAGNOSIS — E1165 Type 2 diabetes mellitus with hyperglycemia: Principal | ICD-10-CM

## 2015-07-05 DIAGNOSIS — IMO0001 Reserved for inherently not codable concepts without codable children: Secondary | ICD-10-CM

## 2015-07-05 MED ORDER — SITAGLIPTIN PHOSPHATE 100 MG PO TABS: 100.0000 mg | ORAL_TABLET | Freq: Every day | ORAL | Status: DC

## 2015-07-05 NOTE — Telephone Encounter (Signed)
Please inform patient that his A1c has increased from a 8.3-9.4 which indicates a worsening of his diabetes I would like him to continue to take the metformin as prescribed and have sent an additional agent called Januvia to his pharmacy for him to take on a daily basis. Please let me know if he has any difficulties obtaining the medication. His kidney function during his most recent urine sample was within the normal ranges. We will plan to follow-up in 3 months after starting Januvia. Please continue to take blood sugars at home and bring the log for next office visit.

## 2015-07-08 MED ORDER — BLOOD GLUCOSE MONITOR KIT: PACK | Status: DC

## 2015-07-08 NOTE — Telephone Encounter (Signed)
LVM for pt to call back.

## 2015-07-08 NOTE — Telephone Encounter (Signed)
Pt request to speak to the assistant again. Please give him a call

## 2015-07-08 NOTE — Telephone Encounter (Signed)
Pt is aware of results. He states that he does not have a meter. Sent in a glucometer for pt and told him to come in when he gets it to learn how to use it. Pt has never used one and does not know how to use it.

## 2015-07-09 MED ORDER — GLIPIZIDE 5 MG PO TABS: 5.0000 mg | ORAL_TABLET | Freq: Every day | ORAL | Status: DC

## 2015-07-09 NOTE — Telephone Encounter (Signed)
Pt aware.

## 2015-07-09 NOTE — Addendum Note (Signed)
Addended by: Mauricio Po D on: 07/09/2015 12:51 PM   Modules accepted: Orders, Medications

## 2015-07-09 NOTE — Telephone Encounter (Signed)
Pt states he does not have insurance and cannot afford [place]. Can you send something in more affordable.

## 2015-07-09 NOTE — Telephone Encounter (Signed)
Glipizide sent to pharmacy. Please ensure he takes it with a meal.

## 2015-07-29 ENCOUNTER — Ambulatory Visit: Payer: Self-pay | Admitting: Family

## 2015-10-08 ENCOUNTER — Telehealth: Payer: Self-pay | Admitting: *Deleted

## 2015-10-08 DIAGNOSIS — E1165 Type 2 diabetes mellitus with hyperglycemia: Principal | ICD-10-CM

## 2015-10-08 DIAGNOSIS — IMO0001 Reserved for inherently not codable concepts without codable children: Secondary | ICD-10-CM

## 2015-10-08 DIAGNOSIS — I10 Essential (primary) hypertension: Secondary | ICD-10-CM

## 2015-10-08 MED ORDER — ONETOUCH VERIO FLEX SYSTEM W/DEVICE KIT: 1.0000 | PACK | Freq: Every day | Status: DC

## 2015-10-08 MED ORDER — GLIPIZIDE 5 MG PO TABS: 5.0000 mg | ORAL_TABLET | Freq: Every day | ORAL | Status: DC

## 2015-10-08 MED ORDER — METFORMIN HCL 1000 MG PO TABS: 1000.0000 mg | ORAL_TABLET | Freq: Two times a day (BID) | ORAL | Status: DC

## 2015-10-08 MED ORDER — GLUCOSE BLOOD VI STRP: 1.0000 | ORAL_STRIP | Freq: Three times a day (TID) | Status: DC

## 2015-10-08 MED ORDER — LISINOPRIL 20 MG PO TABS: 20.0000 mg | ORAL_TABLET | Freq: Every day | ORAL | Status: DC

## 2015-10-08 MED ORDER — ONETOUCH DELICA LANCETS 33G MISC: Status: DC

## 2015-10-08 NOTE — Telephone Encounter (Signed)
Pt stated he need all this meds send into [facility]. He does not know the name of the meter that he will be using yet but he will call us back with the name of the test strip once he find out ( old meter does not work, he has to get a new one).

## 2015-10-08 NOTE — Telephone Encounter (Signed)
Left msg on triage stating needing refills on medication, and also need rx for his resting strips. Called pt back no answer LMOM pls call back to let us know what medication he is needing, also need to know name of testing strips.Marland KitchenJohny [name]

## 2015-10-08 NOTE — Telephone Encounter (Signed)
Pt stated he going to be use Freestyle precision neo test strip.

## 2015-10-08 NOTE — Telephone Encounter (Signed)
Sent refils to [facility] & new one touch BS monitor w/supplies...Steven [name]

## 2016-01-11 ENCOUNTER — Other Ambulatory Visit: Payer: Self-pay | Admitting: *Deleted

## 2016-01-11 DIAGNOSIS — IMO0001 Reserved for inherently not codable concepts without codable children: Secondary | ICD-10-CM

## 2016-01-11 DIAGNOSIS — I10 Essential (primary) hypertension: Secondary | ICD-10-CM

## 2016-01-11 DIAGNOSIS — E1165 Type 2 diabetes mellitus with hyperglycemia: Principal | ICD-10-CM

## 2016-01-11 MED ORDER — GLIPIZIDE 5 MG PO TABS: 5.0000 mg | ORAL_TABLET | Freq: Every day | ORAL | 0 refills | Status: DC

## 2016-01-11 MED ORDER — LISINOPRIL 20 MG PO TABS: 20.0000 mg | ORAL_TABLET | Freq: Every day | ORAL | 0 refills | Status: DC

## 2016-01-11 MED ORDER — METFORMIN HCL 1000 MG PO TABS: 1000.0000 mg | ORAL_TABLET | Freq: Two times a day (BID) | ORAL | 0 refills | Status: DC

## 2016-01-11 NOTE — Telephone Encounter (Signed)
Rec'd call pt states he is needing refills on all his meds. Verified medications inform pt sending refills to [facility]...Steven [name]

## 2016-04-24 ENCOUNTER — Encounter: Payer: Self-pay | Admitting: Family

## 2016-04-24 ENCOUNTER — Ambulatory Visit (INDEPENDENT_AMBULATORY_CARE_PROVIDER_SITE_OTHER): Payer: Self-pay | Admitting: Family

## 2016-04-24 DIAGNOSIS — K4090 Unilateral inguinal hernia, without obstruction or gangrene, not specified as recurrent: Secondary | ICD-10-CM | POA: Insufficient documentation

## 2016-04-24 DIAGNOSIS — IMO0001 Reserved for inherently not codable concepts without codable children: Secondary | ICD-10-CM

## 2016-04-24 DIAGNOSIS — I10 Essential (primary) hypertension: Secondary | ICD-10-CM

## 2016-04-24 DIAGNOSIS — E1165 Type 2 diabetes mellitus with hyperglycemia: Secondary | ICD-10-CM

## 2016-04-24 LAB — POCT GLYCOSYLATED HEMOGLOBIN (HGB A1C): Hemoglobin A1C: 7.7

## 2016-04-24 MED ORDER — LISINOPRIL 20 MG PO TABS
20.0000 mg | ORAL_TABLET | Freq: Every day | ORAL | 0 refills | Status: DC
Start: 2016-04-24 — End: 2016-07-19

## 2016-04-24 MED ORDER — GLIPIZIDE 5 MG PO TABS
5.0000 mg | ORAL_TABLET | Freq: Every day | ORAL | 0 refills | Status: DC
Start: 2016-04-24 — End: 2016-07-19

## 2016-04-24 MED ORDER — METFORMIN HCL 1000 MG PO TABS
1000.0000 mg | ORAL_TABLET | Freq: Two times a day (BID) | ORAL | 0 refills | Status: DC
Start: 2016-04-24 — End: 2016-07-19

## 2016-04-24 NOTE — Assessment & Plan Note (Signed)
Blood pressure appears adequately controlled current medication regimen and no adverse side effects. Does not currently check blood pressure at home. No symptoms of end organ damage noted upon exam and denies worse headache of life. Continue current dosage of lisinopril. Encouraged to monitor blood pressure at home and follow low-sodium diet.

## 2016-04-24 NOTE — Assessment & Plan Note (Signed)
Type 2 diabetes with improved blood sugar control of new A1c of 7.7. Referral sent for diabetic eye exam. Diabetic foot exam is up to date. Pneumovax is up to date. Continue current dosage of glipizide and metformin. Continue to monitor blood sugars at home.

## 2016-04-24 NOTE — Assessment & Plan Note (Signed)
Symptoms and exam consistent with inguinal hernia. Evidence of strangulation or gangrene. Given increased discomfort, refer patient to general surgery for further assessment and treatment. Continue to monitor and follow-up as needed.

## 2016-04-24 NOTE — Progress Notes (Signed)
Subjective:    Patient ID: Steven [name], male    DOB: Apr 10, 1961, 55 y.o.   MRN: [id number]  Chief Complaint  Patient presents with  . Follow-up    refills     HPI:  Steven [name] is a 55 y.o. male who  has a past medical history of Chicken pox; Diabetes mellitus without complication ([place]); and Hypertension. and presents today for a follow up office visit.   1.) Type 2 diabetes - Currently maintained on Metformin and glipizide and reports taking the medication as prescribed and denies adverse side effects or hypoglycemic readings. Not currently checking his blood sugars at home. Denies new symptoms of end organ damage.   Lab Results  Component Value Date   HGBA1C 7.7 04/24/2016    Lab Results  Component Value Date   CREATININE 0.83 07/02/2015   BUN 15 07/02/2015   NA 137 07/02/2015   K 4.3 07/02/2015   CL 101 07/02/2015   CO2 28 07/02/2015   2.) Hypertension - Currently maintained on lisinopril and reports taking the medication as prescribed and denies adverse side effects or cough.    BP Readings from Last 3 Encounters:  04/24/16 120/78  07/02/15 112/80  06/07/15 (!) 135/53    3.) Hernia - This is a new problem. Associated symptom of pain and swelling has been waxing and waning for several years. Pain is described as achy. Increased symptoms with coughing, sneezing, and walking on occasion. Symptoms generally get better at rest.    No Known Allergies    Outpatient Medications Prior to Visit  Medication Sig Dispense Refill  . blood glucose meter kit and supplies KIT Dispense based on patient and insurance preference. Use up to four times daily as directed. (FOR ICD-9 250.00, 250.01). 1 each 0  . Blood Glucose Monitoring Suppl ([place]) w/Device KIT 1 kit by Does not apply route daily. Use as directed to check blood sugar daily Dx E11.9 1 kit 0  . glucose blood (ONETOUCH VERIO) test strip 1 each by Other route 4 (four) times daily -  with meals  and at bedtime. Use to check blood sugars four times a day Dx E11.9 125 each 11  . ONETOUCH DELICA LANCETS 71I MISC Use to help check blood sugars four times a day Dx e11.9 200 each 1  . glipiZIDE (GLUCOTROL) 5 MG tablet Take 1 tablet (5 mg total) by mouth daily. Must make f/u appt for future refills 90 tablet 0  . lisinopril (PRINIVIL,ZESTRIL) 20 MG tablet Take 1 tablet (20 mg total) by mouth daily. Must make f/u appt for future refills 90 tablet 0  . metFORMIN (GLUCOPHAGE) 1000 MG tablet Take 1 tablet (1,000 mg total) by mouth 2 (two) times daily with a meal. Must make f/u appt for future refills 180 tablet 0   No facility-administered medications prior to visit.       Past Surgical History:  Procedure Laterality Date  . TONSILLECTOMY        Past Medical History:  Diagnosis Date  . Chicken pox   . Diabetes mellitus without complication ([place])   . Hypertension       Review of Systems  Constitutional: Negative for chills and fever.  Eyes:       Negative for changes in vision  Respiratory: Negative for cough, chest tightness, shortness of breath and wheezing.   Cardiovascular: Negative for chest pain, palpitations and leg swelling.  Endocrine: Negative for polydipsia, polyphagia and polyuria.  Neurological: Negative  for dizziness, weakness, light-headedness and headaches.      Objective:    BP 120/78 (BP Location: Left Arm, Patient Position: Sitting, Cuff Size: Large)   Pulse (!) 58   Temp 98.1 F (36.7 C) (Oral)   Resp 16   Ht '5\' 7"'  (1.702 m)   Wt 221 lb (100.2 kg)   SpO2 98%   BMI 34.61 kg/m  Nursing note and vital signs reviewed.  Physical Exam  Constitutional: He is oriented to person, place, and time. He appears well-developed and well-nourished. No distress.  Cardiovascular: Normal rate, regular rhythm, normal heart sounds and intact distal pulses.   Pulmonary/Chest: Effort normal and breath sounds normal.  Abdominal: A hernia is present. Hernia confirmed  positive in the right inguinal area.  Neurological: He is alert and oriented to person, place, and time.  Skin: Skin is warm and dry.  Psychiatric: He has a normal mood and affect. His behavior is normal. Judgment and thought content normal.       Assessment & Plan:   Problem List Items Addressed This Visit      Cardiovascular and Mediastinum   Essential hypertension    Blood pressure appears adequately controlled current medication regimen and no adverse side effects. Does not currently check blood pressure at home. No symptoms of end organ damage noted upon exam and denies worse headache of life. Continue current dosage of lisinopril. Encouraged to monitor blood pressure at home and follow low-sodium diet.      Relevant Medications   lisinopril (PRINIVIL,ZESTRIL) 20 MG tablet     Endocrine   Uncontrolled diabetes mellitus ([place])    Type 2 diabetes with improved blood sugar control of new A1c of 7.7. Referral sent for diabetic eye exam. Diabetic foot exam is up to date. Pneumovax is up to date. Continue current dosage of glipizide and metformin. Continue to monitor blood sugars at home.       Relevant Medications   metFORMIN (GLUCOPHAGE) 1000 MG tablet   lisinopril (PRINIVIL,ZESTRIL) 20 MG tablet   glipiZIDE (GLUCOTROL) 5 MG tablet   Other Relevant Orders   Ambulatory referral to Ophthalmology   POCT glycosylated hemoglobin (Hb A1C) (Completed)     Other   Inguinal hernia    Symptoms and exam consistent with inguinal hernia. Evidence of strangulation or gangrene. Given increased discomfort, refer patient to general surgery for further assessment and treatment. Continue to monitor and follow-up as needed.      Relevant Orders   Ambulatory referral to General Surgery       I am having [name] maintain his blood glucose meter kit and supplies, ONETOUCH VERIO FLEX SYSTEM, glucose blood, ONETOUCH DELICA LANCETS 74B, metFORMIN, lisinopril, and glipiZIDE.   Meds ordered  this encounter  Medications  . metFORMIN (GLUCOPHAGE) 1000 MG tablet    Sig: Take 1 tablet (1,000 mg total) by mouth 2 (two) times daily with a meal. Must make f/u appt for future refills    Dispense:  180 tablet    Refill:  0  . lisinopril (PRINIVIL,ZESTRIL) 20 MG tablet    Sig: Take 1 tablet (20 mg total) by mouth daily. Must make f/u appt for future refills    Dispense:  90 tablet    Refill:  0  . glipiZIDE (GLUCOTROL) 5 MG tablet    Sig: Take 1 tablet (5 mg total) by mouth daily. Must make f/u appt for future refills    Dispense:  90 tablet    Refill:  0  Follow-up: Return in about 3 months (around 07/25/2016).  Mauricio Po, FNP

## 2016-04-24 NOTE — Patient Instructions (Addendum)
Thank you for choosing [facility].  SUMMARY AND INSTRUCTIONS:  They will call to schedule your appointment with opthalmology and general surgery.  Medication:  Please continue to take your medications as prescribed.  Your prescription(s) have been submitted to your pharmacy or been printed and provided for you. Please take as directed and contact our office if you believe you are having problem(s) with the medication(s) or have any questions.  Imaging / Radiology:  Please stop by radiology on the basement level of the building for your x-rays. Your results will be released to [place] (or called to you) after review, usually within 72 hours after test completion. If any treatments or changes are necessary, you will be notified at that same time.  Follow up:  If your symptoms worsen or fail to improve, please contact our office for further instruction, or in case of emergency go directly to the emergency room at the closest medical facility.

## 2016-07-19 ENCOUNTER — Other Ambulatory Visit: Payer: Self-pay | Admitting: *Deleted

## 2016-07-19 ENCOUNTER — Other Ambulatory Visit: Payer: Self-pay | Admitting: Family

## 2016-07-19 DIAGNOSIS — I10 Essential (primary) hypertension: Secondary | ICD-10-CM

## 2016-07-19 DIAGNOSIS — E1165 Type 2 diabetes mellitus with hyperglycemia: Principal | ICD-10-CM

## 2016-07-19 DIAGNOSIS — IMO0001 Reserved for inherently not codable concepts without codable children: Secondary | ICD-10-CM

## 2016-07-19 NOTE — Telephone Encounter (Signed)
Pt left msg on triage requesting medication refill. Called pt back no answer LMOM needing to know name of medication he's needing, or he can contact his [facility] pharmacy and have them to send [place] a refill request electronically to have med fill...Steven [name]

## 2016-10-19 ENCOUNTER — Encounter: Payer: Self-pay | Admitting: Nurse Practitioner

## 2016-10-19 ENCOUNTER — Other Ambulatory Visit (INDEPENDENT_AMBULATORY_CARE_PROVIDER_SITE_OTHER): Payer: Self-pay

## 2016-10-19 ENCOUNTER — Ambulatory Visit (INDEPENDENT_AMBULATORY_CARE_PROVIDER_SITE_OTHER): Payer: Self-pay | Admitting: Nurse Practitioner

## 2016-10-19 VITALS — BP 150/74 | HR 70 | Temp 98.8°F | Ht 67.0 in | Wt 214.0 lb

## 2016-10-19 DIAGNOSIS — M109 Gout, unspecified: Secondary | ICD-10-CM

## 2016-10-19 DIAGNOSIS — M25571 Pain in right ankle and joints of right foot: Secondary | ICD-10-CM

## 2016-10-19 DIAGNOSIS — M25471 Effusion, right ankle: Secondary | ICD-10-CM

## 2016-10-19 LAB — BASIC METABOLIC PANEL
BUN: 16 mg/dL (ref 6–23)
CALCIUM: 9.2 mg/dL (ref 8.4–10.5)
CHLORIDE: 100 meq/L (ref 96–112)
CO2: 25 meq/L (ref 19–32)
CREATININE: 0.94 mg/dL (ref 0.40–1.50)
GFR: 105.9 mL/min (ref >60.00)
GLUCOSE: 234 mg/dL — AB (ref 70–99)
Potassium: 4 mEq/L (ref 3.5–5.1)
Sodium: 132 mEq/L — ABNORMAL LOW (ref 135–145)

## 2016-10-19 LAB — URIC ACID: URIC ACID, SERUM: 8.5 mg/dL — AB (ref 4.0–7.8)

## 2016-10-19 MED ORDER — INDOMETHACIN 25 MG PO CAPS: 25.0000 mg | ORAL_CAPSULE | Freq: Two times a day (BID) | ORAL | 0 refills | Status: DC

## 2016-10-19 MED ORDER — KETOROLAC TROMETHAMINE 30 MG/ML IJ SOLN
30.0000 mg | Freq: Once | INTRAMUSCULAR | Status: AC
  Administered 2016-10-19: 30 mg via INTRAMUSCULAR

## 2016-10-19 MED ORDER — ALLOPURINOL 100 MG PO TABS: 100.0000 mg | ORAL_TABLET | Freq: Two times a day (BID) | ORAL | 3 refills | Status: DC

## 2016-10-19 NOTE — Progress Notes (Signed)
Subjective:  Patient ID: Steven [name], male    DOB: [birth year]  Age: 56 y.o. MRN: [id number]  CC: Gout (left inner ankle pain/1 days/ had this before. )   Ankle Pain   The incident occurred 5 to 7 days ago. The incident occurred at home. There was no injury mechanism. The pain is present in the left ankle. The quality of the pain is described as aching and shooting. The pain is at a severity of 10/10. The pain is severe. The pain has been constant since onset. Associated symptoms include an inability to bear weight and a loss of motion. Pertinent negatives include no loss of sensation, muscle weakness, numbness or tingling. The symptoms are aggravated by movement, palpation and weight bearing. He has tried nothing for the symptoms.  hx of gouty arthritis in past. Last flare was 2years ago. Has received multiple corticosteriod joint injections due to similar complaints in past.  Outpatient Medications Prior to Visit  Medication Sig Dispense Refill  . blood glucose meter kit and supplies KIT Dispense based on patient and insurance preference. Use up to four times daily as directed. (FOR ICD-9 250.00, 250.01). 1 each 0  . Blood Glucose Monitoring Suppl ([place]) w/Device KIT 1 kit by Does not apply route daily. Use as directed to check blood sugar daily Dx E11.9 1 kit 0  . glipiZIDE (GLUCOTROL) 5 MG tablet take 1 tablet by mouth once daily **MUST MAKE F/U APPT FOR FURTHER REFILLS** 90 tablet 0  . glucose blood (ONETOUCH VERIO) test strip 1 each by Other route 4 (four) times daily -  with meals and at bedtime. Use to check blood sugars four times a day Dx E11.9 125 each 11  . lisinopril (PRINIVIL,ZESTRIL) 20 MG tablet take 1 tablet by mouth once daily **MUST MAKE F/U APPT. FOR FUTURE REFILLS** 90 tablet 0  . metFORMIN (GLUCOPHAGE) 1000 MG tablet take 1 tablet by mouth twice a day with food **MUST MAKE F/U APPT FOR FUTURE REFILLS** 180 tablet 0  . ONETOUCH DELICA LANCETS 76A MISC  Use to help check blood sugars four times a day Dx e11.9 200 each 1   No facility-administered medications prior to visit.     ROS See HPI  Objective:  BP (!) 150/74   Pulse 70   Temp 98.8 F (37.1 C)   Ht '5\' 7"'  (1.702 m)   Wt 214 lb (97.1 kg)   SpO2 99%   BMI 33.52 kg/m   BP Readings from Last 3 Encounters:  10/19/16 (!) 150/74  04/24/16 120/78  07/02/15 112/80    Wt Readings from Last 3 Encounters:  10/19/16 214 lb (97.1 kg)  04/24/16 221 lb (100.2 kg)  07/02/15 212 lb 6.4 oz (96.3 kg)    Physical Exam  Constitutional: He is oriented to person, place, and time. No distress.  Cardiovascular: Normal rate.   Pulmonary/Chest: Effort normal.  Musculoskeletal: He exhibits edema and tenderness. He exhibits no deformity.       Right ankle: Normal.       Left ankle: He exhibits decreased range of motion and swelling. He exhibits no ecchymosis, no deformity, no laceration and normal pulse. Tenderness. Medial malleolus tenderness found. No lateral malleolus, no AITFL, no CF ligament, no posterior TFL and no proximal fibula tenderness found. Achilles tendon normal.       Left lower leg: Normal.       Left foot: Normal.       Feet:  Neurological: He is  alert and oriented to person, place, and time.  Non weight bearing on left foot. Use of walker at this time.  Vitals reviewed.   Lab Results  Component Value Date   WBC 9.8 10/16/2010   HGB 13.3 06/07/2015   HCT 39.0 06/07/2015   PLT 280 10/16/2010   GLUCOSE 234 (H) 10/19/2016   ALT 66 (H) 02/24/2008   AST 59 (H) 02/24/2008   NA 132 (L) 10/19/2016   K 4.0 10/19/2016   CL 100 10/19/2016   CREATININE 0.94 10/19/2016   BUN 16 10/19/2016   CO2 25 10/19/2016   HGBA1C 7.7 04/24/2016   MICROALBUR 0.8 07/02/2015    No results found.  Assessment & Plan:   Steven [name] was seen today for gout.  Diagnoses and all orders for this visit:  Acute gouty arthritis -     Uric acid; Future -     Basic metabolic panel; Future -      indomethacin (INDOCIN) 25 MG capsule; Take 1 capsule (25 mg total) by mouth 2 (two) times daily with a meal.  Pain and swelling of ankle, right -     indomethacin (INDOCIN) 25 MG capsule; Take 1 capsule (25 mg total) by mouth 2 (two) times daily with a meal. -     ketorolac (TORADOL) 30 MG/ML injection 30 mg; Inject 1 mL (30 mg total) into the muscle once.   I am having Steven [name] start on indomethacin. I am also having him maintain his blood glucose meter kit and supplies, ONETOUCH VERIO FLEX SYSTEM, glucose blood, ONETOUCH DELICA LANCETS 57D, metFORMIN, glipiZIDE, and lisinopril. We administered ketorolac.  Meds ordered this encounter  Medications  . indomethacin (INDOCIN) 25 MG capsule    Sig: Take 1 capsule (25 mg total) by mouth 2 (two) times daily with a meal.    Dispense:  20 capsule    Refill:  0    Order Specific Question:   Supervising Provider    Answer:   Cassandria Anger [[id number]]  . ketorolac (TORADOL) 30 MG/ML injection 30 mg    Follow-up: Return if symptoms worsen or fail to improve.  Wilfred Lacy, NP

## 2016-10-19 NOTE — Patient Instructions (Addendum)
Please go to basement for blood draw. You will be contacted with results.  Alternate between warm and cold compress as needed. Elevate joint as much as possible.  Low-Purine Diet Purines are compounds that affect the level of uric acid in your body. A low-purine diet is a diet that is low in purines. Eating a low-purine diet can prevent the level of uric acid in your body from getting too high and causing gout or kidney stones or both. What do I need to know about this diet?  Choose low-purine foods. Examples of low-purine foods are listed in the next section.  Drink plenty of fluids, especially water. Fluids can help remove uric acid from your body. Try to drink 8-16 cups (1.9-3.8 L) a day.  Limit foods high in fat, especially saturated fat, as fat makes it harder for the body to get rid of uric acid. Foods high in saturated fat include pizza, cheese, ice cream, whole milk, fried foods, and gravies. Choose foods that are lower in fat and lean sources of protein. Use olive oil when cooking as it contains healthy fats that are not high in saturated fat.  Limit alcohol. Alcohol interferes with the elimination of uric acid from your body. If you are having a gout attack, avoid all alcohol.  Keep in mind that different people's bodies react differently to different foods. You will probably learn over time which foods do or do not affect you. If you discover that a food tends to cause your gout to flare up, avoid eating that food. You can more freely enjoy foods that do not cause problems. If you have any questions about a food item, talk to your dietitian or health care provider. Which foods are low, moderate, and high in purines? The following is a list of foods that are low, moderate, and high in purines. You can eat any amount of the foods that are low in purines. You may be able to have small amounts of foods that are moderate in purines. Ask your health care provider how much of a food moderate  in purines you can have. Avoid foods high in purines. Grains   Foods low in purines: Enriched white bread, pasta, rice, cake, cornbread, popcorn.  Foods moderate in purines: Whole-grain breads and cereals, wheat germ, bran, oatmeal. Uncooked oatmeal. Dry wheat bran or wheat germ.  Foods high in purines: Pancakes, [place] toast, biscuits, muffins. Vegetables   Foods low in purines: All vegetables, except those that are moderate in purines.  Foods moderate in purines: Asparagus, cauliflower, spinach, mushrooms, green peas. Fruits   All fruits are low in purines. Meats and other Protein Foods   Foods low in purines: Eggs, nuts, peanut butter.  Foods moderate in purines: 80-90% lean beef, lamb, veal, pork, poultry, fish, eggs, peanut butter, nuts. Crab, lobster, oysters, and shrimp. Cooked dried beans, peas, and lentils.  Foods high in purines: Anchovies, sardines, herring, mussels, tuna, codfish, scallops, trout, and haddock. Steven [name]. Organ meats (such as liver or kidney). Tripe. Game meat. Goose. Sweetbreads. Dairy   All dairy foods are low in purines. Low-fat and fat-free dairy products are best because they are low in saturated fat. Beverages   Drinks low in purines: Water, carbonated beverages, tea, coffee, cocoa.  Drinks moderate in purines: Soft drinks and other drinks sweetened with high-fructose corn syrup. Juices. To find whether a food or drink is sweetened with high-fructose corn syrup, look at the ingredients list.  Drinks high in purines: Alcoholic beverages (such  as beer). Condiments   Foods low in purines: Salt, herbs, olives, pickles, relishes, vinegar.  Foods moderate in purines: Butter, margarine, oils, mayonnaise. Fats and Oils   Foods low in purines: All types, except gravies and sauces made with meat.  Foods high in purines: Gravies and sauces made with meat. Other Foods   Foods low in purines: Sugars, sweets, gelatin. Cake. Soups made without  meat.  Foods moderate in purines: Meat-based or fish-based soups, broths, or bouillons. Foods and drinks sweetened with high-fructose corn syrup.  Foods high in purines: High-fat desserts (such as ice cream, cookies, cakes, pies, doughnuts, and chocolate). Contact your dietitian for more information on foods that are not listed here.  This information is not intended to replace advice given to you by your health care provider. Make sure you discuss any questions you have with your health care provider. Document Released: 09/09/2010 Document Revised: 10/21/2015 Document Reviewed: 04/21/2013 Elsevier Interactive Patient Education  2017 [employer].

## 2016-10-30 ENCOUNTER — Telehealth: Payer: Self-pay | Admitting: *Deleted

## 2016-10-30 DIAGNOSIS — M109 Gout, unspecified: Secondary | ICD-10-CM

## 2016-10-30 DIAGNOSIS — M25471 Effusion, right ankle: Secondary | ICD-10-CM

## 2016-10-30 DIAGNOSIS — M25571 Pain in right ankle and joints of right foot: Secondary | ICD-10-CM

## 2016-10-30 MED ORDER — INDOMETHACIN 25 MG PO CAPS: 25.0000 mg | ORAL_CAPSULE | Freq: Two times a day (BID) | ORAL | 0 refills | Status: DC | PRN

## 2016-10-30 NOTE — Telephone Encounter (Signed)
Left msg on triage he is needing refills on the indomethacin that was given for gout flare. Pls advise...Steven [name]

## 2016-10-30 NOTE — Telephone Encounter (Signed)
Medication refilled. Please ensure use only as needed for gout flares.

## 2016-10-30 NOTE — Telephone Encounter (Signed)
Called pt no answer LMOM [facility].../lmb

## 2016-11-10 ENCOUNTER — Other Ambulatory Visit: Payer: Self-pay | Admitting: Family

## 2016-11-10 DIAGNOSIS — M25571 Pain in right ankle and joints of right foot: Secondary | ICD-10-CM

## 2016-11-10 DIAGNOSIS — M109 Gout, unspecified: Secondary | ICD-10-CM

## 2016-11-10 DIAGNOSIS — M25471 Effusion, right ankle: Secondary | ICD-10-CM

## 2016-11-16 ENCOUNTER — Other Ambulatory Visit (INDEPENDENT_AMBULATORY_CARE_PROVIDER_SITE_OTHER): Payer: Self-pay

## 2016-11-16 ENCOUNTER — Encounter: Payer: Self-pay | Admitting: Family

## 2016-11-16 ENCOUNTER — Ambulatory Visit (INDEPENDENT_AMBULATORY_CARE_PROVIDER_SITE_OTHER): Payer: Self-pay | Admitting: Family

## 2016-11-16 VITALS — BP 120/72 | HR 59 | Temp 98.6°F | Ht 67.0 in | Wt 208.0 lb

## 2016-11-16 DIAGNOSIS — Z7289 Other problems related to lifestyle: Secondary | ICD-10-CM

## 2016-11-16 DIAGNOSIS — E1165 Type 2 diabetes mellitus with hyperglycemia: Secondary | ICD-10-CM

## 2016-11-16 DIAGNOSIS — IMO0001 Reserved for inherently not codable concepts without codable children: Secondary | ICD-10-CM

## 2016-11-16 DIAGNOSIS — M1A0720 Idiopathic chronic gout, left ankle and foot, without tophus (tophi): Secondary | ICD-10-CM

## 2016-11-16 LAB — COMPREHENSIVE METABOLIC PANEL
ALT: 22 U/L (ref 0–53)
AST: 12 U/L (ref 0–37)
Albumin: 4.4 g/dL (ref 3.5–5.2)
Alkaline Phosphatase: 54 U/L (ref 39–117)
BUN: 14 mg/dL (ref 6–23)
CHLORIDE: 100 meq/L (ref 96–112)
CO2: 27 mEq/L (ref 19–32)
Calcium: 9.9 mg/dL (ref 8.4–10.5)
Creatinine, Ser: 0.99 mg/dL (ref 0.40–1.50)
GFR: 99.72 mL/min (ref >60.00)
GLUCOSE: 198 mg/dL — AB (ref 70–99)
POTASSIUM: 4.3 meq/L (ref 3.5–5.1)
Sodium: 136 mEq/L (ref 135–145)
Total Bilirubin: 0.6 mg/dL (ref 0.2–1.2)
Total Protein: 7.3 g/dL (ref 6.0–8.3)

## 2016-11-16 LAB — MICROALBUMIN / CREATININE URINE RATIO
Creatinine,U: 181.6 mg/dL
MICROALB/CREAT RATIO: 0.5 mg/g (ref 0.0–30.0)
Microalb, Ur: 0.9 mg/dL (ref 0.0–1.9)

## 2016-11-16 LAB — URIC ACID: URIC ACID, SERUM: 7.1 mg/dL (ref 4.0–7.8)

## 2016-11-16 LAB — HEMOGLOBIN A1C: Hgb A1c MFr Bld: 9.2 % — ABNORMAL HIGH (ref 4.6–6.5)

## 2016-11-16 NOTE — Progress Notes (Signed)
Subjective:    Patient ID: Steven [name], male    DOB: 1960-06-01, 56 y.o.   MRN: [id number]  Chief Complaint  Patient presents with  . Follow-up    medications and gout    HPI:  Steven [name] is a 56 y.o. male who  has a past medical history of Chicken pox; Diabetes mellitus without complication ([place]); and Hypertension. and presents today for a follow up office visit.   1.) Gout - Currently maintained on allopurinol for maintenance and indomethacin for exacerbations. No recent gout flares. Symptoms are generally well controlled with only occasional edema.  Lab Results  Component Value Date   LABURIC 8.5 (H) 10/19/2016    2.) Diabetes - Maintained on glipizide and metoprolol. Reports taking the medication as prescribed and denies adverse side effects or hypoglycemic readings. Not currently checking blood sugars at home  . Denies new symptoms of end organ damage. No excessive hunger, thirst, or urination. Working on a low/carbohydrate modified oral intake.   Lab Results  Component Value Date   HGBA1C 7.7 04/24/2016     Lab Results  Component Value Date   CREATININE 0.94 10/19/2016   BUN 16 10/19/2016   NA 132 (L) 10/19/2016   K 4.0 10/19/2016   CL 100 10/19/2016   CO2 25 10/19/2016                                             No Known Allergies    Outpatient Medications Prior to Visit  Medication Sig Dispense Refill  . allopurinol (ZYLOPRIM) 100 MG tablet Take 1 tablet (100 mg total) by mouth 2 (two) times daily. 60 tablet 3  . blood glucose meter kit and supplies KIT Dispense based on patient and insurance preference. Use up to four times daily as directed. (FOR ICD-9 250.00, 250.01). 1 each 0  . Blood Glucose Monitoring Suppl ([place]) w/Device KIT 1 kit by Does not apply route daily. Use as directed to check blood sugar daily Dx E11.9 1 kit 0  . glipiZIDE (GLUCOTROL) 5 MG tablet take 1 tablet by mouth once daily **MUST MAKE F/U APPT FOR FURTHER  REFILLS** 90 tablet 0  . glucose blood (ONETOUCH VERIO) test strip 1 each by Other route 4 (four) times daily -  with meals and at bedtime. Use to check blood sugars four times a day Dx E11.9 125 each 11  . indomethacin (INDOCIN) 25 MG capsule TAKE 1 CAPSULE (25 MG TOTAL) BY MOUTH 2 (TWO) TIMES DAILY AS NEEDED. 30 capsule 0  . lisinopril (PRINIVIL,ZESTRIL) 20 MG tablet take 1 tablet by mouth once daily **MUST MAKE F/U APPT. FOR FUTURE REFILLS** 90 tablet 0  . metFORMIN (GLUCOPHAGE) 1000 MG tablet take 1 tablet by mouth twice a day with food **MUST MAKE F/U APPT FOR FUTURE REFILLS** 180 tablet 0  . ONETOUCH DELICA LANCETS 39Q MISC Use to help check blood sugars four times a day Dx e11.9 200 each 1   No facility-administered medications prior to visit.     Review of Systems  Eyes:       Negative for changes in vision.  Respiratory: Negative for chest tightness and shortness of breath.   Cardiovascular: Negative for chest pain, palpitations and leg swelling.  Endocrine: Negative for polydipsia, polyphagia and polyuria.  Neurological: Negative for dizziness, weakness, light-headedness and headaches.  Objective:    BP 120/72 (BP Location: Left Arm, Patient Position: Sitting, Cuff Size: Normal)   Pulse (!) 59   Temp 98.6 F (37 C) (Oral)   Ht _0  (1.702 m)   Wt 208 lb (94.3 kg)   SpO2 99%   BMI 32.58 kg/m  Nursing note and vital signs reviewed.  Physical Exam  Constitutional: He is oriented to person, place, and time. He appears well-developed and well-nourished. No distress.  Cardiovascular: Normal rate, regular rhythm, normal heart sounds and intact distal pulses.   Pulmonary/Chest: Effort normal and breath sounds normal.  Neurological: He is alert and oriented to person, place, and time.  Diabetic Foot Exam - Simple   Simple Foot Form Diabetic Foot exam was performed with the following findings:  Yes  11/16/2016 10:36 AM  Visual Inspection No deformities, no ulcerations,  no other skin breakdown bilaterally:  Yes Sensation Testing Intact to touch and monofilament testing bilaterally:  Yes Pulse Check Posterior Tibialis and Dorsalis pulse intact bilaterally:  Yes  Skin: Skin is warm and dry.  Psychiatric: He has a normal mood and affect. His behavior is normal. Judgment and thought content normal.       Assessment & Plan:   Problem List Items Addressed This Visit      Endocrine   Uncontrolled diabetes mellitus ([place]) - Primary    Obtain hemoglobin A1c, urine microalbumin and complete metabolic profile. Diabetic foot exam completed today. Not currently checking blood sugars at home with encouragement to start checking blood sugars at least once daily. Maintained on lisinopril for CAD risk reduction. Diabetic eye exam and Pneumovax are up-to-date per recommendations. Continue current dosage of glipizide and metformin pending A1c results.      Relevant Orders   Comprehensive metabolic panel   Hemoglobin A1c   Urine Microalbumin w/creat. ratio     Musculoskeletal and Integument   Chronic idiopathic gout involving toe of left foot without tophus    Gout with no current exacerbation or flare. Obtain uric acid level. Continue current dosage of allopurinol pending uric acid level. Continue indomethacin as needed for flares.      Relevant Orders   Uric acid    Other Visit Diagnoses    Other problems related to lifestyle       Relevant Orders   Hepatitis C antibody       I am having Mr. Marchetti maintain his blood glucose meter kit and supplies, ONETOUCH VERIO FLEX SYSTEM, glucose blood, ONETOUCH DELICA LANCETS 49Q, metFORMIN, glipiZIDE, lisinopril, allopurinol, and indomethacin.   Follow-up: Return in about 3 months (around 02/16/2017).   Mauricio Po, FNP

## 2016-11-16 NOTE — Assessment & Plan Note (Signed)
Gout with no current exacerbation or flare. Obtain uric acid level. Continue current dosage of allopurinol pending uric acid level. Continue indomethacin as needed for flares.

## 2016-11-16 NOTE — Assessment & Plan Note (Signed)
Obtain hemoglobin A1c, urine microalbumin and complete metabolic profile. Diabetic foot exam completed today. Not currently checking blood sugars at home with encouragement to start checking blood sugars at least once daily. Maintained on lisinopril for CAD risk reduction. Diabetic eye exam and Pneumovax are up-to-date per recommendations. Continue current dosage of glipizide and metformin pending A1c results.

## 2016-11-16 NOTE — Patient Instructions (Signed)
Thank you for choosing [facility].  SUMMARY AND INSTRUCTIONS:  Please continue to take your medications as prescribed.  We will check your blood work and refill your medications pending the blood work.  Medication:  Your prescription(s) have been submitted to your pharmacy or been printed and provided for you. Please take as directed and contact our office if you believe you are having problem(s) with the medication(s) or have any questions.  Labs:  Please stop by the lab on the lower level of the building for your blood work. Your results will be released to [place] (or called to you) after review, usually within 72 hours after test completion. If any changes need to be made, you will be notified at that same time.  1.) The lab is open from 7:30am to 5:30 pm Monday-Friday 2.) No appointment is necessary 3.) Fasting (if needed) is 6-8 hours after food and drink; black coffee and water are okay   Follow up:  If your symptoms worsen or fail to improve, please contact our office for further instruction, or in case of emergency go directly to the emergency room at the closest medical facility.

## 2016-11-17 ENCOUNTER — Telehealth: Payer: Self-pay

## 2016-11-17 ENCOUNTER — Other Ambulatory Visit: Payer: Self-pay | Admitting: Family

## 2016-11-17 DIAGNOSIS — E1165 Type 2 diabetes mellitus with hyperglycemia: Principal | ICD-10-CM

## 2016-11-17 DIAGNOSIS — IMO0001 Reserved for inherently not codable concepts without codable children: Secondary | ICD-10-CM

## 2016-11-17 LAB — HEPATITIS C ANTIBODY: HCV Ab: NEGATIVE

## 2016-11-17 MED ORDER — GLIPIZIDE 5 MG PO TABS: ORAL_TABLET | ORAL | 1 refills | Status: DC

## 2016-11-17 MED ORDER — METFORMIN HCL 1000 MG PO TABS: ORAL_TABLET | ORAL | 1 refills | Status: DC

## 2016-11-17 MED ORDER — EMPAGLIFLOZIN 10 MG PO TABS: 10.0000 mg | ORAL_TABLET | Freq: Every day | ORAL | 2 refills | Status: DC

## 2016-11-17 NOTE — Telephone Encounter (Signed)
error 

## 2016-11-22 ENCOUNTER — Other Ambulatory Visit: Payer: Self-pay

## 2016-11-22 ENCOUNTER — Telehealth: Payer: Self-pay | Admitting: *Deleted

## 2016-11-22 ENCOUNTER — Other Ambulatory Visit: Payer: Self-pay | Admitting: Family

## 2016-11-22 DIAGNOSIS — E1165 Type 2 diabetes mellitus with hyperglycemia: Principal | ICD-10-CM

## 2016-11-22 DIAGNOSIS — IMO0001 Reserved for inherently not codable concepts without codable children: Secondary | ICD-10-CM

## 2016-11-22 DIAGNOSIS — I10 Essential (primary) hypertension: Secondary | ICD-10-CM

## 2016-11-22 DIAGNOSIS — M109 Gout, unspecified: Secondary | ICD-10-CM

## 2016-11-22 MED ORDER — LISINOPRIL 20 MG PO TABS: ORAL_TABLET | ORAL | 1 refills | Status: DC

## 2016-11-22 MED ORDER — GLIPIZIDE 5 MG PO TABS: ORAL_TABLET | ORAL | 1 refills | Status: DC

## 2016-11-22 MED ORDER — METFORMIN HCL 1000 MG PO TABS: ORAL_TABLET | ORAL | 1 refills | Status: DC

## 2016-11-22 NOTE — Telephone Encounter (Signed)
Spoke with pt and he advised that all the medications he was told to check the prices on were too expensive. He asked if he could increase the glipizide or metformin any more than they are now. He is paying for everything out of pocket he does not currently have insurance. Please advise.

## 2016-11-22 NOTE — Telephone Encounter (Signed)
We can try to increase the glipizide to 2 tablets daily. Please ensure he eats prior to taking these medications.

## 2016-11-22 NOTE — Telephone Encounter (Signed)
Rec'd call pt states he was not able to afford the Jardiance med too expensive cost $400. He also checked on the (2)alternative which both meds is also close to $400. Needing recommendation on what else he can take. Also need [facility]. Sent BP med to pharmacy pls advise on alternative for Jardiance...Steven [name]

## 2016-11-23 MED ORDER — ALLOPURINOL 100 MG PO TABS: 100.0000 mg | ORAL_TABLET | Freq: Two times a day (BID) | ORAL | 3 refills | Status: DC

## 2016-11-23 NOTE — Telephone Encounter (Signed)
Pt aware.

## 2016-11-23 NOTE — Addendum Note (Signed)
Addended by: Delice Bison E on: 11/23/2016 02:04 PM   Modules accepted: Orders

## 2016-11-27 ENCOUNTER — Telehealth: Payer: Self-pay | Admitting: *Deleted

## 2016-11-27 ENCOUNTER — Other Ambulatory Visit: Payer: Self-pay | Admitting: Family

## 2016-11-27 DIAGNOSIS — M109 Gout, unspecified: Secondary | ICD-10-CM

## 2016-11-27 DIAGNOSIS — M25571 Pain in right ankle and joints of right foot: Secondary | ICD-10-CM

## 2016-11-27 DIAGNOSIS — M25471 Effusion, right ankle: Secondary | ICD-10-CM

## 2016-11-27 MED ORDER — INDOMETHACIN 25 MG PO CAPS
25.0000 mg | ORAL_CAPSULE | Freq: Two times a day (BID) | ORAL | 0 refills | Status: DC | PRN
Start: 2016-11-27 — End: 2017-12-03

## 2016-11-27 NOTE — Telephone Encounter (Signed)
Rec'd call pt states he is needing his Indomethacin sent to [facility] Also want to ask Marya Amsler if he need to continue taking the [facility] he gave samples if so will need [facility]...Steven [name]

## 2016-11-28 NOTE — Telephone Encounter (Signed)
Pt has called again this am pls verify if he need to continue taking the [facility], and if so he is wanting [facility]...Steven [name]

## 2016-11-28 NOTE — Telephone Encounter (Signed)
He does not need to continue taking the [facility]. Okay to send in indomethacin as needed for gout. If he continues to need an antiinflammatory we will send one in.

## 2016-11-28 NOTE — Telephone Encounter (Signed)
Notified pt w/Greg response.../lmb 

## 2016-12-09 ENCOUNTER — Other Ambulatory Visit: Payer: Self-pay | Admitting: Family

## 2016-12-09 DIAGNOSIS — M109 Gout, unspecified: Secondary | ICD-10-CM

## 2016-12-09 DIAGNOSIS — M25471 Effusion, right ankle: Secondary | ICD-10-CM

## 2016-12-09 DIAGNOSIS — M25571 Pain in right ankle and joints of right foot: Secondary | ICD-10-CM

## 2017-01-22 ENCOUNTER — Other Ambulatory Visit: Payer: Self-pay | Admitting: Family

## 2017-01-22 DIAGNOSIS — M25571 Pain in right ankle and joints of right foot: Secondary | ICD-10-CM

## 2017-01-22 DIAGNOSIS — M109 Gout, unspecified: Secondary | ICD-10-CM

## 2017-01-22 DIAGNOSIS — M25471 Effusion, right ankle: Secondary | ICD-10-CM

## 2017-03-06 ENCOUNTER — Other Ambulatory Visit: Payer: Self-pay | Admitting: Family

## 2017-03-06 DIAGNOSIS — M109 Gout, unspecified: Secondary | ICD-10-CM

## 2017-03-06 DIAGNOSIS — M25571 Pain in right ankle and joints of right foot: Secondary | ICD-10-CM

## 2017-03-06 DIAGNOSIS — M25471 Effusion, right ankle: Secondary | ICD-10-CM

## 2017-04-02 ENCOUNTER — Telehealth: Payer: Self-pay | Admitting: Internal Medicine

## 2017-04-02 DIAGNOSIS — I10 Essential (primary) hypertension: Secondary | ICD-10-CM

## 2017-04-02 DIAGNOSIS — E1165 Type 2 diabetes mellitus with hyperglycemia: Secondary | ICD-10-CM

## 2017-04-02 DIAGNOSIS — IMO0001 Reserved for inherently not codable concepts without codable children: Secondary | ICD-10-CM

## 2017-04-02 MED ORDER — METFORMIN HCL 1000 MG PO TABS: ORAL_TABLET | ORAL | 0 refills | Status: DC

## 2017-04-02 MED ORDER — LISINOPRIL 20 MG PO TABS: ORAL_TABLET | ORAL | 0 refills | Status: DC

## 2017-04-02 NOTE — Telephone Encounter (Signed)
Patient has scheduled transfer to Dr. Jenny Reichmann in January (next available).  Patient is requesting refill on lisinopril and metformin.  [facility] [address]

## 2017-04-11 ENCOUNTER — Other Ambulatory Visit: Payer: Self-pay | Admitting: *Deleted

## 2017-04-11 DIAGNOSIS — E1165 Type 2 diabetes mellitus with hyperglycemia: Principal | ICD-10-CM

## 2017-04-11 DIAGNOSIS — IMO0001 Reserved for inherently not codable concepts without codable children: Secondary | ICD-10-CM

## 2017-04-11 MED ORDER — GLIPIZIDE 5 MG PO TABS: ORAL_TABLET | ORAL | 0 refills | Status: DC

## 2017-04-26 ENCOUNTER — Other Ambulatory Visit: Payer: Self-pay | Admitting: Family

## 2017-04-26 DIAGNOSIS — M25471 Effusion, right ankle: Secondary | ICD-10-CM

## 2017-04-26 DIAGNOSIS — M25571 Pain in right ankle and joints of right foot: Secondary | ICD-10-CM

## 2017-04-26 DIAGNOSIS — M109 Gout, unspecified: Secondary | ICD-10-CM

## 2017-05-01 ENCOUNTER — Other Ambulatory Visit: Payer: Self-pay | Admitting: Family

## 2017-05-01 DIAGNOSIS — M25471 Effusion, right ankle: Secondary | ICD-10-CM

## 2017-05-01 DIAGNOSIS — M109 Gout, unspecified: Secondary | ICD-10-CM

## 2017-05-01 DIAGNOSIS — M25571 Pain in right ankle and joints of right foot: Secondary | ICD-10-CM

## 2017-05-11 ENCOUNTER — Other Ambulatory Visit: Payer: Self-pay | Admitting: Family

## 2017-05-11 DIAGNOSIS — M109 Gout, unspecified: Secondary | ICD-10-CM

## 2017-05-11 DIAGNOSIS — M25571 Pain in right ankle and joints of right foot: Secondary | ICD-10-CM

## 2017-05-11 DIAGNOSIS — M25471 Effusion, right ankle: Secondary | ICD-10-CM

## 2017-05-18 ENCOUNTER — Other Ambulatory Visit: Payer: Self-pay | Admitting: Family

## 2017-05-18 DIAGNOSIS — M25571 Pain in right ankle and joints of right foot: Secondary | ICD-10-CM

## 2017-05-18 DIAGNOSIS — M25471 Effusion, right ankle: Secondary | ICD-10-CM

## 2017-05-18 DIAGNOSIS — M109 Gout, unspecified: Secondary | ICD-10-CM

## 2017-06-04 ENCOUNTER — Ambulatory Visit: Payer: Self-pay | Admitting: Internal Medicine

## 2017-06-11 ENCOUNTER — Ambulatory Visit (INDEPENDENT_AMBULATORY_CARE_PROVIDER_SITE_OTHER): Payer: Self-pay | Admitting: Internal Medicine

## 2017-06-11 ENCOUNTER — Encounter: Payer: Self-pay | Admitting: Internal Medicine

## 2017-06-11 VITALS — BP 136/86 | HR 63 | Temp 98.0°F | Ht 67.0 in | Wt 229.0 lb

## 2017-06-11 DIAGNOSIS — Z0000 Encounter for general adult medical examination without abnormal findings: Secondary | ICD-10-CM | POA: Insufficient documentation

## 2017-06-11 DIAGNOSIS — I10 Essential (primary) hypertension: Secondary | ICD-10-CM

## 2017-06-11 DIAGNOSIS — E1165 Type 2 diabetes mellitus with hyperglycemia: Secondary | ICD-10-CM

## 2017-06-11 DIAGNOSIS — Z0001 Encounter for general adult medical examination with abnormal findings: Secondary | ICD-10-CM | POA: Insufficient documentation

## 2017-06-11 DIAGNOSIS — IMO0001 Reserved for inherently not codable concepts without codable children: Secondary | ICD-10-CM

## 2017-06-11 DIAGNOSIS — M1A0720 Idiopathic chronic gout, left ankle and foot, without tophus (tophi): Secondary | ICD-10-CM

## 2017-06-11 DIAGNOSIS — Z23 Encounter for immunization: Secondary | ICD-10-CM

## 2017-06-11 LAB — POCT GLYCOSYLATED HEMOGLOBIN (HGB A1C): Hemoglobin A1C: 10.2

## 2017-06-11 MED ORDER — METFORMIN HCL 1000 MG PO TABS: ORAL_TABLET | ORAL | 3 refills | Status: DC

## 2017-06-11 MED ORDER — GLIPIZIDE ER 10 MG PO TB24: 10.0000 mg | ORAL_TABLET | Freq: Every day | ORAL | 3 refills | Status: DC

## 2017-06-11 MED ORDER — PIOGLITAZONE HCL 30 MG PO TABS: 30.0000 mg | ORAL_TABLET | Freq: Every day | ORAL | 3 refills | Status: DC

## 2017-06-11 NOTE — Assessment & Plan Note (Signed)
stable overall by history and exam, recent data reviewed with pt, and pt to continue medical treatment as before,  to f/u any worsening symptoms or concerns BP Readings from Last 3 Encounters:  06/11/17 136/86  11/16/16 120/72  10/19/16 (!) 150/74

## 2017-06-11 NOTE — Progress Notes (Signed)
Subjective:    Patient ID: Steven [name], male    DOB: 04/12/1961, 57 y.o.   MRN: [id number]  HPI  Here to establish with new PCP; has no insurance, wants to minimize costs; overall doing ok,  Pt denies chest pain, increasing sob or doe, wheezing, orthopnea, PND, increased LE swelling, palpitations, dizziness or syncope.  Pt denies new neurological symptoms such as new headache, or facial or extremity weakness or numbness.  Pt denies polydipsia, polyuria, or low sugar episode.  Pt states overall good compliance with meds, mostly trying to follow appropriate diet, with wt overall stable,  but little exercise however.  Wt has increased with lack of exercise  Not taking jardiance due to cost BP Readings from Last 3 Encounters:  06/11/17 136/86  11/16/16 120/72  10/19/16 (!) 150/74   Wt Readings from Last 3 Encounters:  06/11/17 229 lb (103.9 kg)  11/16/16 208 lb (94.3 kg)  10/19/16 214 lb (97.1 kg)   Past Medical History:  Diagnosis Date  . Chicken pox   . Diabetes mellitus without complication ([place])   . Hypertension    Past Surgical History:  Procedure Laterality Date  . TONSILLECTOMY      reports that he has quit smoking. His smoking use included cigarettes. He has a 50.00 pack-year smoking history. He has quit using smokeless tobacco. His smokeless tobacco use included chew. He reports that he drinks alcohol. He reports that he does not use drugs. family history includes Diabetes in his mother; Heart disease in his mother; Hypertension in his father and mother; Stroke in his brother and father. No Known Allergies Current Outpatient Medications on File Prior to Visit  Medication Sig Dispense Refill  . allopurinol (ZYLOPRIM) 100 MG tablet Take 1 tablet (100 mg total) by mouth 2 (two) times daily. 60 tablet 3  . blood glucose meter kit and supplies KIT Dispense based on patient and insurance preference. Use up to four times daily as directed. (FOR ICD-9 250.00, 250.01). 1 each 0  .  Blood Glucose Monitoring Suppl ([place]) w/Device KIT 1 kit by Does not apply route daily. Use as directed to check blood sugar daily Dx E11.9 1 kit 0  . glucose blood (ONETOUCH VERIO) test strip 1 each by Other route 4 (four) times daily -  with meals and at bedtime. Use to check blood sugars four times a day Dx E11.9 125 each 11  . indomethacin (INDOCIN) 25 MG capsule Take 1 capsule (25 mg total) by mouth 2 (two) times daily as needed. 30 capsule 0  . lisinopril (PRINIVIL,ZESTRIL) 20 MG tablet take 1 tablet by mouth once daily 90 tablet 0  . ONETOUCH DELICA LANCETS 36O MISC Use to help check blood sugars four times a day Dx e11.9 200 each 1  . [DISCONTINUED] cetirizine (ZYRTEC) 10 MG tablet Take 1 tablet (10 mg total) by mouth daily as needed for allergies. 30 tablet 0  . [DISCONTINUED] omeprazole (PRILOSEC) 20 MG capsule Take 1 capsule (20 mg total) by mouth daily. 30 capsule 0   No current facility-administered medications on file prior to visit.    Review of Systems All otherwise neg per pt    Objective:   Physical Exam BP 136/86   Pulse 63   Temp 98 F (36.7 C) (Oral)   Ht _0  (1.702 m)   Wt 229 lb (103.9 kg)   SpO2 99%   BMI 35.87 kg/m  VS noted,  Constitutional: Pt appears in NAD HENT:  Head: NCAT.  Right Ear: External ear normal.  Left Ear: External ear normal.  Eyes: . Pupils are equal, round, and reactive to light. Conjunctivae and EOM are normal Nose: without d/c or deformity Neck: Neck supple. Gross normal ROM Cardiovascular: Normal rate and regular rhythm.   Pulmonary/Chest: Effort normal and breath sounds without rales or wheezing.  Abd:  Soft, NT, ND, + BS, no organomegaly Neurological: Pt is alert. At baseline orientation, motor grossly intact Skin: Skin is warm. No rashes, other new lesions, no LE edema Psychiatric: Pt behavior is normal without agitation  No other exam findings  Lab Results  Component Value Date   WBC 9.8 10/16/2010     HGB 13.3 06/07/2015   HCT 39.0 06/07/2015   PLT 280 10/16/2010   GLUCOSE 198 (H) 11/16/2016   ALT 22 11/16/2016   AST 12 11/16/2016   NA 136 11/16/2016   K 4.3 11/16/2016   CL 100 11/16/2016   CREATININE 0.99 11/16/2016   BUN 14 11/16/2016   CO2 27 11/16/2016   HGBA1C 9.2 (H) 11/16/2016   MICROALBUR 0.9 11/16/2016   Contains abnormal data POCT glycosylated hemoglobin (Hb A1C)   Component 15:57  Hemoglobin A1C 10.2           Assessment & Plan:

## 2017-06-11 NOTE — Assessment & Plan Note (Signed)
Stable, cont current med tx 

## 2017-06-11 NOTE — Patient Instructions (Addendum)
You had the flu shot today  Your A1c was higher today at 10.2  OK to increase the glipizide ER to the 10 mg per day  Please take all new medication as prescribed - the actos 30 mg daily  Please continue all other medications as before, and refills have been done for the metformin  Please have the pharmacy call with any other refills you may need.  Please continue your efforts at being more active, low cholesterol diabetic diet, and weight control.  Please keep your appointments with your specialists as you may have planned  Please return in 6 months, or sooner if needed

## 2017-06-11 NOTE — Assessment & Plan Note (Signed)
Ok to cont metformin, increase glipizide er 10 qd, and add actos 30 qd, cont diet and wt control efforts, f/u 6 mo

## 2017-06-25 ENCOUNTER — Other Ambulatory Visit: Payer: Self-pay | Admitting: Internal Medicine

## 2017-06-25 DIAGNOSIS — I10 Essential (primary) hypertension: Secondary | ICD-10-CM

## 2017-09-03 ENCOUNTER — Other Ambulatory Visit: Payer: Self-pay | Admitting: Family

## 2017-09-03 DIAGNOSIS — M25471 Effusion, right ankle: Secondary | ICD-10-CM

## 2017-09-03 DIAGNOSIS — M25571 Pain in right ankle and joints of right foot: Secondary | ICD-10-CM

## 2017-09-03 DIAGNOSIS — M109 Gout, unspecified: Secondary | ICD-10-CM

## 2017-09-20 ENCOUNTER — Other Ambulatory Visit: Payer: Self-pay | Admitting: Internal Medicine

## 2017-09-20 DIAGNOSIS — I10 Essential (primary) hypertension: Secondary | ICD-10-CM

## 2017-11-05 ENCOUNTER — Other Ambulatory Visit: Payer: Self-pay | Admitting: Family

## 2017-11-05 DIAGNOSIS — M109 Gout, unspecified: Secondary | ICD-10-CM

## 2017-11-05 DIAGNOSIS — M25471 Effusion, right ankle: Secondary | ICD-10-CM

## 2017-11-05 DIAGNOSIS — M25571 Pain in right ankle and joints of right foot: Secondary | ICD-10-CM

## 2017-12-03 ENCOUNTER — Other Ambulatory Visit: Payer: Self-pay

## 2017-12-03 DIAGNOSIS — M25571 Pain in right ankle and joints of right foot: Secondary | ICD-10-CM

## 2017-12-03 DIAGNOSIS — M109 Gout, unspecified: Secondary | ICD-10-CM

## 2017-12-03 DIAGNOSIS — M25471 Effusion, right ankle: Secondary | ICD-10-CM

## 2017-12-03 MED ORDER — INDOMETHACIN 25 MG PO CAPS: 25.0000 mg | ORAL_CAPSULE | Freq: Two times a day (BID) | ORAL | 0 refills | Status: DC | PRN

## 2017-12-10 ENCOUNTER — Encounter: Payer: Self-pay | Admitting: Internal Medicine

## 2017-12-10 ENCOUNTER — Other Ambulatory Visit (INDEPENDENT_AMBULATORY_CARE_PROVIDER_SITE_OTHER): Payer: Self-pay

## 2017-12-10 ENCOUNTER — Ambulatory Visit: Payer: Self-pay | Admitting: Nurse Practitioner

## 2017-12-10 ENCOUNTER — Other Ambulatory Visit: Payer: Self-pay | Admitting: Internal Medicine

## 2017-12-10 ENCOUNTER — Ambulatory Visit (INDEPENDENT_AMBULATORY_CARE_PROVIDER_SITE_OTHER): Payer: Self-pay | Admitting: Internal Medicine

## 2017-12-10 VITALS — BP 114/74 | HR 65 | Temp 98.7°F | Ht 67.0 in | Wt 223.0 lb

## 2017-12-10 DIAGNOSIS — M109 Gout, unspecified: Secondary | ICD-10-CM

## 2017-12-10 DIAGNOSIS — I10 Essential (primary) hypertension: Secondary | ICD-10-CM

## 2017-12-10 DIAGNOSIS — M25471 Effusion, right ankle: Secondary | ICD-10-CM

## 2017-12-10 DIAGNOSIS — E1165 Type 2 diabetes mellitus with hyperglycemia: Secondary | ICD-10-CM

## 2017-12-10 DIAGNOSIS — I872 Venous insufficiency (chronic) (peripheral): Secondary | ICD-10-CM | POA: Insufficient documentation

## 2017-12-10 DIAGNOSIS — M1A0720 Idiopathic chronic gout, left ankle and foot, without tophus (tophi): Secondary | ICD-10-CM

## 2017-12-10 DIAGNOSIS — R609 Edema, unspecified: Secondary | ICD-10-CM

## 2017-12-10 DIAGNOSIS — M25571 Pain in right ankle and joints of right foot: Secondary | ICD-10-CM

## 2017-12-10 LAB — URINALYSIS, ROUTINE W REFLEX MICROSCOPIC
Bilirubin Urine: NEGATIVE
HGB URINE DIPSTICK: NEGATIVE
Ketones, ur: NEGATIVE
Leukocytes, UA: NEGATIVE
Nitrite: NEGATIVE
RBC / HPF: NONE SEEN (ref 0–?)
Specific Gravity, Urine: 1.025 (ref 1.000–1.030)
Total Protein, Urine: NEGATIVE
Urine Glucose: 100 — AB
Urobilinogen, UA: 0.2 (ref 0.0–1.0)
WBC UA: NONE SEEN (ref 0–?)
pH: 5.5 (ref 5.0–8.0)

## 2017-12-10 LAB — HEPATIC FUNCTION PANEL
ALT: 23 U/L (ref 0–53)
AST: 13 U/L (ref 0–37)
Albumin: 4 g/dL (ref 3.5–5.2)
Alkaline Phosphatase: 51 U/L (ref 39–117)
Bilirubin, Direct: 0.1 mg/dL (ref 0.0–0.3)
Total Bilirubin: 0.4 mg/dL (ref 0.2–1.2)
Total Protein: 7 g/dL (ref 6.0–8.3)

## 2017-12-10 LAB — LIPID PANEL
Cholesterol: 187 mg/dL (ref 0–200)
HDL: 36.8 mg/dL — ABNORMAL LOW (ref >39.00)
LDL CALC: 114 mg/dL — AB (ref 0–99)
NONHDL: 149.92
Total CHOL/HDL Ratio: 5
Triglycerides: 178 mg/dL — ABNORMAL HIGH (ref 0.0–149.0)
VLDL: 35.6 mg/dL (ref 0.0–40.0)

## 2017-12-10 LAB — BASIC METABOLIC PANEL
BUN: 16 mg/dL (ref 6–23)
CHLORIDE: 102 meq/L (ref 96–112)
CO2: 25 mEq/L (ref 19–32)
CREATININE: 1.22 mg/dL (ref 0.40–1.50)
Calcium: 9 mg/dL (ref 8.4–10.5)
GFR: 78.07 mL/min (ref >60.00)
GLUCOSE: 224 mg/dL — AB (ref 70–99)
Potassium: 4 mEq/L (ref 3.5–5.1)
Sodium: 136 mEq/L (ref 135–145)

## 2017-12-10 LAB — HEMOGLOBIN A1C: HEMOGLOBIN A1C: 9.8 % — AB (ref 4.6–6.5)

## 2017-12-10 MED ORDER — ALLOPURINOL 100 MG PO TABS: 100.0000 mg | ORAL_TABLET | Freq: Two times a day (BID) | ORAL | 3 refills | Status: DC

## 2017-12-10 MED ORDER — INDOMETHACIN 25 MG PO CAPS: 25.0000 mg | ORAL_CAPSULE | Freq: Two times a day (BID) | ORAL | 2 refills | Status: DC | PRN

## 2017-12-10 MED ORDER — HYDROCHLOROTHIAZIDE 25 MG PO TABS: ORAL_TABLET | ORAL | 3 refills | Status: DC

## 2017-12-10 MED ORDER — LOVASTATIN 40 MG PO TABS: 40.0000 mg | ORAL_TABLET | Freq: Every day | ORAL | 3 refills | Status: DC

## 2017-12-10 NOTE — Assessment & Plan Note (Signed)
stable overall by history and exam, recent data reviewed with pt, and pt to continue medical treatment as before,  to f/u any worsening symptoms or concerns ble BP Readings from Last 3 Encounters:  12/10/17 114/74  06/11/17 136/86  11/16/16 120/72

## 2017-12-10 NOTE — Assessment & Plan Note (Signed)
Likely related to actos vs venous insufficiency, for hct 12.5 qd prn

## 2017-12-10 NOTE — Assessment & Plan Note (Signed)
stable overall by history and exam, recent data reviewed with pt, and pt to continue medical treatment as before,  to f/u any worsening symptoms or concerns Lab Results  Component Value Date   HGBA1C 10.2 06/11/2017

## 2017-12-10 NOTE — Progress Notes (Signed)
Subjective:    Patient ID: Steven [name], male    DOB: 1960-10-14, 57 y.o.   MRN: [id number]  HPI  Here to f/u; overall doing ok,  Pt denies chest pain, increasing sob or doe, wheezing, orthopnea, PND, palpitations, dizziness or syncope, though has some bilat leg swelling with starting actos 30.  Pt denies new neurological symptoms such as new headache, or facial or extremity weakness or numbness.  Pt denies polydipsia, polyuria, or low sugar episode.  Pt states overall good compliance with meds, mostly trying to follow appropriate diet, with wt overall stable,  but little exercise however  Out of gout med for several days and now with left knee pain  Also having some mild low mid abd pain x 2 days for unclear reason, but Denies urinary symptoms such as dysuria, frequency, urgency, flank pain, hematuria or n/v, fever, chills.  Denies worsening reflux, dysphagia, n/v, bowel change or blood.  Past Medical History:  Diagnosis Date  . Chicken pox   . Diabetes mellitus without complication ([place])   . Hypertension    Past Surgical History:  Procedure Laterality Date  . TONSILLECTOMY      reports that he has quit smoking. His smoking use included cigarettes. He has a 50.00 pack-year smoking history. He has quit using smokeless tobacco. His smokeless tobacco use included chew. He reports that he drinks alcohol. He reports that he does not use drugs. family history includes Diabetes in his mother; Heart disease in his mother; Hypertension in his father and mother; Stroke in his brother and father. No Known Allergies Current Outpatient Medications on File Prior to Visit  Medication Sig Dispense Refill  . allopurinol (ZYLOPRIM) 100 MG tablet Take 1 tablet (100 mg total) by mouth 2 (two) times daily. 60 tablet 3  . blood glucose meter kit and supplies KIT Dispense based on patient and insurance preference. Use up to four times daily as directed. (FOR ICD-9 250.00, 250.01). 1 each 0  . Blood Glucose  Monitoring Suppl ([place]) w/Device KIT 1 kit by Does not apply route daily. Use as directed to check blood sugar daily Dx E11.9 1 kit 0  . glipiZIDE (GLUCOTROL XL) 10 MG 24 hr tablet Take 1 tablet (10 mg total) by mouth daily with breakfast. 90 tablet 3  . glucose blood (ONETOUCH VERIO) test strip 1 each by Other route 4 (four) times daily -  with meals and at bedtime. Use to check blood sugars four times a day Dx E11.9 125 each 11  . indomethacin (INDOCIN) 25 MG capsule Take 1 capsule (25 mg total) by mouth 2 (two) times daily as needed. 30 capsule 0  . lisinopril (PRINIVIL,ZESTRIL) 20 MG tablet TAKE 1 TABLET BY MOUTH EVERY DAY 90 tablet 0  . metFORMIN (GLUCOPHAGE) 1000 MG tablet take 1 tablet by mouth twice a day with food 180 tablet 3  . ONETOUCH DELICA LANCETS 70Y MISC Use to help check blood sugars four times a day Dx e11.9 200 each 1  . pioglitazone (ACTOS) 30 MG tablet Take 1 tablet (30 mg total) by mouth daily. 90 tablet 3  . [DISCONTINUED] cetirizine (ZYRTEC) 10 MG tablet Take 1 tablet (10 mg total) by mouth daily as needed for allergies. 30 tablet 0  . [DISCONTINUED] omeprazole (PRILOSEC) 20 MG capsule Take 1 capsule (20 mg total) by mouth daily. 30 capsule 0   No current facility-administered medications on file prior to visit.    Review of Systems  Constitutional: Negative  for other unusual diaphoresis or sweats HENT: Negative for ear discharge or swelling Eyes: Negative for other worsening visual disturbances Respiratory: Negative for stridor or other swelling  Gastrointestinal: Negative for worsening distension or other blood Genitourinary: Negative for retention or other urinary change Musculoskeletal: Negative for other MSK pain or swelling Skin: Negative for color change or other new lesions Neurological: Negative for worsening tremors and other numbness  Psychiatric/Behavioral: Negative for worsening agitation or other fatigue   Objective:   Physical  Exam BP 114/74   Pulse 65   Temp 98.7 F (37.1 C) (Oral)   Ht '5\' 7"'  (1.702 m)   Wt 223 lb (101.2 kg)   SpO2 96%   BMI 34.93 kg/m  VS noted,  Constitutional: Pt appears in NAD HENT: Head: NCAT.  Right Ear: External ear normal.  Left Ear: External ear normal.  Eyes: . Pupils are equal, round, and reactive to light. Conjunctivae and EOM are normal Nose: without d/c or deformity Neck: Neck supple. Gross normal ROM Cardiovascular: Normal rate and regular rhythm.   Pulmonary/Chest: Effort normal and breath sounds without rales or wheezing.  Abd:  Soft, NT, ND, + BS, no organomegaly Neurological: Pt is alert. At baseline orientation, motor grossly intact Skin: Skin is warm. No rashes, other new lesions, trace bilat LE edema Psychiatric: Pt behavior is normal without agitation  No other exam findings Lab Results  Component Value Date   WBC 9.8 10/16/2010   HGB 13.3 06/07/2015   HCT 39.0 06/07/2015   PLT 280 10/16/2010   GLUCOSE 198 (H) 11/16/2016   ALT 22 11/16/2016   AST 12 11/16/2016   NA 136 11/16/2016   K 4.3 11/16/2016   CL 100 11/16/2016   CREATININE 0.99 11/16/2016   BUN 14 11/16/2016   CO2 27 11/16/2016   HGBA1C 10.2 06/11/2017   MICROALBUR 0.9 11/16/2016         Assessment & Plan:

## 2017-12-10 NOTE — Patient Instructions (Addendum)
Please take all new medication as prescribed - the fluid pill  Please continue all other medications as before, and refills have been done if requested.  Please have the pharmacy call with any other refills you may need.  Please continue your efforts at being more active, low cholesterol diet, and weight control.  You are otherwise up to date with prevention measures today.  Please keep your appointments with your specialists as you may have planned  Please go to the LAB in the Basement (turn left off the elevator) for the tests to be done today  You will be contacted by phone if any changes need to be made immediately.  Otherwise, you will receive a letter about your results with an explanation, but please check with MyChart first.  Please remember to sign up for MyChart if you have not done so, as this will be important to you in the future with finding out test results, communicating by private email, and scheduling acute appointments online when needed.  Please return in 6 months, or sooner if needed, with Lab testing done 3-5 days before

## 2017-12-10 NOTE — Assessment & Plan Note (Signed)
Ok to restart antigout med

## 2017-12-11 ENCOUNTER — Telehealth: Payer: Self-pay

## 2017-12-11 NOTE — Telephone Encounter (Signed)
-----   Message from [name], MD sent at 12/10/2017  5:40 PM EDT ----- Letter sent, cont same tx except  The test results show that your current treatment is OK, except the sugar and LDL cholesterol are still too high.  It appears the pill medications are not working well.  I would like to start 70/30 insulin as this is the most cost effective, but if you would want another insulin such as lantus, that would be ok too.  We also need to start a cholesterol medication called lovastatin 40 mg per day.  I will send the prescription for this, and you should hear from the office as well.Redmond Baseman to please inform pt, I will do rx for lovastatin, and please ask if he is willing to start insulin (and stop the actos)

## 2017-12-11 NOTE — Telephone Encounter (Signed)
Pt has been informed and expressed understanding. Pt stated that he would to think it over first before starting insulin since he doesn't have insurance. I stated if the cost is still to high we could look into patient assistance options to help cover the cost. He stated that he would get back to me after he gives it some thought.

## 2017-12-11 NOTE — Telephone Encounter (Signed)
Please also ask pt to check with [facility] on the cost of their brand on 70/30 insulin (such as the cost of one bottle), which is probably the least expensive out there, and ask him to compare it to Lantus at the same [facility]

## 2017-12-12 NOTE — Telephone Encounter (Signed)
Pt has been informed and expressed understanding. He stated that he would call [facility] to inquire.

## 2017-12-17 ENCOUNTER — Ambulatory Visit (HOSPITAL_COMMUNITY)
Admission: EM | Admit: 2017-12-17 | Discharge: 2017-12-17 | Disposition: A | Discharge status: Home / Self Care | Payer: Self-pay | Attending: Family Medicine | Admitting: Family Medicine

## 2017-12-17 ENCOUNTER — Encounter (HOSPITAL_COMMUNITY): Payer: Self-pay | Admitting: Emergency Medicine

## 2017-12-17 DIAGNOSIS — Z8249 Family history of ischemic heart disease and other diseases of the circulatory system: Secondary | ICD-10-CM | POA: Insufficient documentation

## 2017-12-17 DIAGNOSIS — Z9889 Other specified postprocedural states: Secondary | ICD-10-CM | POA: Insufficient documentation

## 2017-12-17 DIAGNOSIS — Z79899 Other long term (current) drug therapy: Secondary | ICD-10-CM | POA: Insufficient documentation

## 2017-12-17 DIAGNOSIS — E1165 Type 2 diabetes mellitus with hyperglycemia: Secondary | ICD-10-CM | POA: Insufficient documentation

## 2017-12-17 DIAGNOSIS — M109 Gout, unspecified: Secondary | ICD-10-CM | POA: Insufficient documentation

## 2017-12-17 DIAGNOSIS — I10 Essential (primary) hypertension: Secondary | ICD-10-CM | POA: Insufficient documentation

## 2017-12-17 DIAGNOSIS — Z87891 Personal history of nicotine dependence: Secondary | ICD-10-CM | POA: Insufficient documentation

## 2017-12-17 DIAGNOSIS — Z833 Family history of diabetes mellitus: Secondary | ICD-10-CM | POA: Insufficient documentation

## 2017-12-17 DIAGNOSIS — R1030 Lower abdominal pain, unspecified: Secondary | ICD-10-CM | POA: Insufficient documentation

## 2017-12-17 DIAGNOSIS — Z7984 Long term (current) use of oral hypoglycemic drugs: Secondary | ICD-10-CM | POA: Insufficient documentation

## 2017-12-17 DIAGNOSIS — Z794 Long term (current) use of insulin: Secondary | ICD-10-CM | POA: Insufficient documentation

## 2017-12-17 DIAGNOSIS — Z823 Family history of stroke: Secondary | ICD-10-CM | POA: Insufficient documentation

## 2017-12-17 LAB — CBC
HCT: 36.5 % — ABNORMAL LOW (ref 39.0–52.0)
HEMOGLOBIN: 11.2 g/dL — AB (ref 13.0–17.0)
MCH: 24.4 pg — ABNORMAL LOW (ref 26.0–34.0)
MCHC: 30.7 g/dL (ref 30.0–36.0)
MCV: 79.5 fL (ref 78.0–100.0)
PLATELETS: 371 10*3/uL (ref 150–400)
RBC: 4.59 MIL/uL (ref 4.22–5.81)
RDW: 15.6 % — AB (ref 11.5–15.5)
WBC: 9 10*3/uL (ref 4.0–10.5)

## 2017-12-17 LAB — POCT URINALYSIS DIP (DEVICE)
Bilirubin Urine: NEGATIVE
Glucose, UA: NEGATIVE mg/dL
Hgb urine dipstick: NEGATIVE
Ketones, ur: NEGATIVE mg/dL
LEUKOCYTES UA: NEGATIVE
NITRITE: NEGATIVE
PH: 5.5 (ref 5.0–8.0)
PROTEIN: NEGATIVE mg/dL
Specific Gravity, Urine: 1.01 (ref 1.005–1.030)
UROBILINOGEN UA: 0.2 mg/dL (ref 0.0–1.0)

## 2017-12-17 MED ORDER — ONDANSETRON 4 MG PO TBDP: 4.0000 mg | ORAL_TABLET | Freq: Three times a day (TID) | ORAL | 0 refills | Status: DC | PRN

## 2017-12-17 MED ORDER — ACETAMINOPHEN 500 MG PO TABS: 500.0000 mg | ORAL_TABLET | Freq: Four times a day (QID) | ORAL | 0 refills | Status: DC | PRN

## 2017-12-17 NOTE — Discharge Instructions (Signed)
Tylenol as needed for pain.  Zofran as needed for nausea.  We will call you if your lab test returns with abnormal findings.  Please follow up closely with your primary care provider for further evaluation of persistent symptoms.  If develop increased pain, vomiting, blood in stool, fevers, or otherwise worsening please go to Er.

## 2017-12-17 NOTE — ED Triage Notes (Signed)
PT reports lower abdominal pain with nausea and cold sweats. No vomiting, no diarrhea.

## 2017-12-17 NOTE — ED Provider Notes (Signed)
[facility]    CSN: [id number] Arrival date & time: 12/17/17  1856     History   Chief Complaint Chief Complaint  Patient presents with  . Abdominal Pain    HPI Steven [name] is a 57 y.o. male.   [name] presents with complaints of low abdominal pain which increased today. Mild nausea. Pain is an ache. Some movements do worsen the pain. states he has had some mild symptoms for weeks now. Eating and drinking but decreased appetite. No fever. Normal BM's, last was yesterday. No blood in stool. No vomiting. No previous similar. No previous abdominal surgeries. Eating worsens the pain. Has been passing gas. Denies urinary symptoms. Has not taken any medications for symptoms. Hx of dm, htn, gout.    ROS per HPI.      Past Medical History:  Diagnosis Date  . Chicken pox   . Diabetes mellitus without complication ([place])   . Hypertension     Patient Active Problem List   Diagnosis Date Noted  . Peripheral edema 12/10/2017  . Wellness examination 06/11/2017  . 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 Inguinal hernia 04/24/2016  . Chest wall pain 07/02/2015  . Essential hypertension 10/14/2014  . Uncontrolled diabetes mellitus ([place]) 10/14/2014    Past Surgical History:  Procedure Laterality Date  . TONSILLECTOMY         Home Medications    Prior to Admission medications   Medication Sig Start Date End Date Taking? Authorizing Provider  acetaminophen (TYLENOL) 500 MG tablet Take 1 tablet (500 mg total) by mouth every 6 (six) hours as needed. 12/17/17   Zigmund Gottron, NP  allopurinol (ZYLOPRIM) 100 MG tablet Take 1 tablet (100 mg total) by mouth 2 (two) times daily. 12/10/17   [name], MD  blood glucose meter kit and supplies KIT Dispense based on patient and insurance preference. Use up to four times daily as directed. (FOR ICD-9 250.00, 250.01). 07/08/15   Golden Circle, FNP  Blood Glucose Monitoring Suppl  ([place]) w/Device KIT 1 kit by Does not apply route daily. Use as directed to check blood sugar daily Dx E11.9 10/08/15   Golden Circle, FNP  glipiZIDE (GLUCOTROL XL) 10 MG 24 hr tablet Take 1 tablet (10 mg total) by mouth daily with breakfast. 06/11/17   [name], MD  glucose blood (ONETOUCH VERIO) test strip 1 each by Other route 4 (four) times daily -  with meals and at bedtime. Use to check blood sugars four times a day Dx E11.9 10/08/15   Golden Circle, FNP  hydrochlorothiazide (HYDRODIURIL) 25 MG tablet 1/2 tab by mouth daily as needed for leg swelling 12/10/17   [name], MD  indomethacin (INDOCIN) 25 MG capsule Take 1 capsule (25 mg total) by mouth 2 (two) times daily as needed. 12/10/17   [name], MD  lisinopril (PRINIVIL,ZESTRIL) 20 MG tablet TAKE 1 TABLET BY MOUTH EVERY DAY 09/20/17   Lance Sell, NP  lovastatin (MEVACOR) 40 MG tablet Take 1 tablet (40 mg total) by mouth at bedtime. 12/10/17   [name], MD  metFORMIN (GLUCOPHAGE) 1000 MG tablet take 1 tablet by mouth twice a day with food 06/11/17   [name], MD  ondansetron (ZOFRAN-ODT) 4 MG disintegrating tablet Take 1 tablet (4 mg total) by mouth every 8 (eight) hours as needed for nausea or vomiting. 12/17/17   Zigmund Gottron,  NP  ONETOUCH DELICA LANCETS 63W MISC Use to help check blood sugars four times a day Dx e11.9 10/08/15   Golden Circle, FNP  pioglitazone (ACTOS) 30 MG tablet Take 1 tablet (30 mg total) by mouth daily. 06/11/17   [name], MD    Family History Family History  Problem Relation Age of Onset  . Heart disease Mother   . Hypertension Mother   . Diabetes Mother   . Stroke Father   . Hypertension Father   . Stroke Brother     Social History Social History   Tobacco Use  . Smoking status: Former Smoker    Packs/day: 2.00    Years: 25.00    Pack years: 50.00    Types: Cigarettes  . Smokeless tobacco: Former Systems developer    Types: Chew  Substance  Use Topics  . Alcohol use: Yes    Comment: occasionally  . Drug use: No     Allergies   Patient has no known allergies.   Review of Systems Review of Systems   Physical Exam Triage Vital Signs ED Triage Vitals  Enc Vitals Group     BP 12/17/17 1932 (!) 149/70     Pulse Rate 12/17/17 1932 (!) 57     Resp 12/17/17 1932 16     Temp 12/17/17 1932 98.8 F (37.1 C)     Temp Source 12/17/17 1932 Oral     SpO2 12/17/17 1932 99 %     Weight 12/17/17 1932 225 lb (102.1 kg)     Height --      Head Circumference --      Peak Flow --      Pain Score 12/17/17 1931 8     Pain Loc --      Pain Edu? --      Excl. in [place]? --    No data found.  Updated Vital Signs BP (!) 149/70   Pulse (!) 57   Temp 98.8 F (37.1 C) (Oral)   Resp 16   Wt 225 lb (102.1 kg)   SpO2 99%   BMI 35.24 kg/m    Physical Exam  Constitutional: He is oriented to person, place, and time. He appears well-developed and well-nourished.  Cardiovascular: Normal rate and regular rhythm.  Pulmonary/Chest: Effort normal and breath sounds normal.  Abdominal: Soft. Bowel sounds are normal. There is no hepatosplenomegaly, splenomegaly or hepatomegaly. There is tenderness in the right lower quadrant, suprapubic area and left lower quadrant. There is no rigidity, no rebound, no guarding, no CVA tenderness, no tenderness at McBurney's point and negative Murphy's sign. No hernia.  Neurological: He is alert and oriented to person, place, and time.  Skin: Skin is warm and dry.     UC Treatments / Results  Labs (all labs ordered are listed, but only abnormal results are displayed) Labs Reviewed  CBC  POCT URINALYSIS DIP (DEVICE)    EKG None  Radiology No results found.  Procedures Procedures (including critical care time)  Medications Ordered in UC Medications - No data to display  Initial Impression / Assessment and Plan / UC Course  I have reviewed the triage vital signs and the nursing  notes.  Pertinent labs & imaging results that were available during my care of the patient were reviewed by me and considered in my medical decision making (see chart for details).     Vitals stable. Non toxic in appearance. Afebrile. No tachycardia.  Symptoms for weeks but with some increase  today. Low abdominal pain on palpation, without right or left lower quadrant specific tenderness. CBC obtained and pending. Will notify patient if elevated to go to Er for further eval. Patient has appointment tomorrow with PCP in regards to his knee, states he will bring up his abdominal pain as may require additional outpatient evaluation if symptoms persist. Return precautions provided. Patient verbalized understanding and agreeable to plan.  Ambulatory out of clinic without difficulty.   Final Clinical Impressions(s) / UC Diagnoses   Final diagnoses:  Lower abdominal pain     Discharge Instructions     Tylenol as needed for pain.  Zofran as needed for nausea.  We will call you if your lab test returns with abnormal findings.  Please follow up closely with your primary care provider for further evaluation of persistent symptoms.  If develop increased pain, vomiting, blood in stool, fevers, or otherwise worsening please go to Er.     ED Prescriptions    Medication Sig Dispense Auth. Provider   acetaminophen (TYLENOL) 500 MG tablet Take 1 tablet (500 mg total) by mouth every 6 (six) hours as needed. 30 tablet Fawnda Vitullo, Lanelle Bal B, NP   ondansetron (ZOFRAN-ODT) 4 MG disintegrating tablet Take 1 tablet (4 mg total) by mouth every 8 (eight) hours as needed for nausea or vomiting. 12 tablet Zigmund Gottron, NP     Controlled Substance Prescriptions [place] Controlled Substance Registry consulted? Not Applicable   Zigmund Gottron, NP 12/17/17 2050

## 2017-12-18 ENCOUNTER — Inpatient Hospital Stay (HOSPITAL_COMMUNITY)
Admission: EM | Admit: 2017-12-18 | Discharge: 2017-12-21 | DRG: 352 | Disposition: A | Discharge status: Home / Self Care | Payer: Self-pay | Attending: Surgery | Admitting: Surgery

## 2017-12-18 ENCOUNTER — Ambulatory Visit: Payer: Self-pay | Admitting: Family

## 2017-12-18 ENCOUNTER — Ambulatory Visit (INDEPENDENT_AMBULATORY_CARE_PROVIDER_SITE_OTHER)
Admission: RE | Admit: 2017-12-18 | Discharge: 2017-12-18 | Disposition: A | Discharge status: Home / Self Care | Payer: Self-pay | Source: Ambulatory Visit | Attending: Family | Admitting: Family

## 2017-12-18 ENCOUNTER — Encounter (HOSPITAL_COMMUNITY): Payer: Self-pay | Admitting: Emergency Medicine

## 2017-12-18 ENCOUNTER — Telehealth (HOSPITAL_COMMUNITY): Payer: Self-pay

## 2017-12-18 ENCOUNTER — Encounter: Payer: Self-pay | Admitting: Family

## 2017-12-18 VITALS — BP 136/70 | HR 61 | Temp 98.4°F | Ht 67.0 in | Wt 218.4 lb

## 2017-12-18 DIAGNOSIS — D1779 Benign lipomatous neoplasm of other sites: Secondary | ICD-10-CM | POA: Diagnosis present

## 2017-12-18 DIAGNOSIS — Z7984 Long term (current) use of oral hypoglycemic drugs: Secondary | ICD-10-CM

## 2017-12-18 DIAGNOSIS — M25562 Pain in left knee: Secondary | ICD-10-CM

## 2017-12-18 DIAGNOSIS — R1909 Other intra-abdominal and pelvic swelling, mass and lump: Secondary | ICD-10-CM

## 2017-12-18 DIAGNOSIS — Z87891 Personal history of nicotine dependence: Secondary | ICD-10-CM

## 2017-12-18 DIAGNOSIS — E669 Obesity, unspecified: Secondary | ICD-10-CM | POA: Diagnosis present

## 2017-12-18 DIAGNOSIS — M109 Gout, unspecified: Secondary | ICD-10-CM | POA: Diagnosis present

## 2017-12-18 DIAGNOSIS — R300 Dysuria: Secondary | ICD-10-CM | POA: Diagnosis present

## 2017-12-18 DIAGNOSIS — I10 Essential (primary) hypertension: Secondary | ICD-10-CM | POA: Diagnosis present

## 2017-12-18 DIAGNOSIS — R600 Localized edema: Secondary | ICD-10-CM | POA: Diagnosis present

## 2017-12-18 DIAGNOSIS — E1165 Type 2 diabetes mellitus with hyperglycemia: Secondary | ICD-10-CM | POA: Diagnosis present

## 2017-12-18 DIAGNOSIS — K4030 Unilateral inguinal hernia, with obstruction, without gangrene, not specified as recurrent: Principal | ICD-10-CM | POA: Insufficient documentation

## 2017-12-18 DIAGNOSIS — Z6833 Body mass index (BMI) 33.0-33.9, adult: Secondary | ICD-10-CM

## 2017-12-18 DIAGNOSIS — R109 Unspecified abdominal pain: Secondary | ICD-10-CM

## 2017-12-18 DIAGNOSIS — K4090 Unilateral inguinal hernia, without obstruction or gangrene, not specified as recurrent: Secondary | ICD-10-CM

## 2017-12-18 LAB — CBC
HEMATOCRIT: 36.9 % — AB (ref 39.0–52.0)
HEMOGLOBIN: 11.7 g/dL — AB (ref 13.0–17.0)
MCH: 25.1 pg — ABNORMAL LOW (ref 26.0–34.0)
MCHC: 31.7 g/dL (ref 30.0–36.0)
MCV: 79.2 fL (ref 78.0–100.0)
Platelets: 361 10*3/uL (ref 150–400)
RBC: 4.66 MIL/uL (ref 4.22–5.81)
RDW: 15.7 % — ABNORMAL HIGH (ref 11.5–15.5)
WBC: 7.8 10*3/uL (ref 4.0–10.5)

## 2017-12-18 LAB — CBG MONITORING, ED: GLUCOSE-CAPILLARY: 70 mg/dL (ref 70–99)

## 2017-12-18 LAB — CREATININE, SERUM
CREATININE: 0.93 mg/dL (ref 0.61–1.24)
GFR calc Af Amer: >60 mL/min (ref >60)

## 2017-12-18 LAB — URINALYSIS, ROUTINE W REFLEX MICROSCOPIC
Bilirubin Urine: NEGATIVE
Glucose, UA: NEGATIVE mg/dL
HGB URINE DIPSTICK: NEGATIVE
KETONES UR: NEGATIVE mg/dL
Leukocytes, UA: NEGATIVE
Nitrite: NEGATIVE
PROTEIN: NEGATIVE mg/dL
Specific Gravity, Urine: 1.011 (ref 1.005–1.030)
pH: 5 (ref 5.0–8.0)

## 2017-12-18 LAB — GLUCOSE, CAPILLARY: Glucose-Capillary: 208 mg/dL — ABNORMAL HIGH (ref 70–99)

## 2017-12-18 MED ORDER — SCOPOLAMINE 1 MG/3DAYS TD PT72
1.0000 | MEDICATED_PATCH | TRANSDERMAL | Status: DC
  Administered 2017-12-19: 1.5 mg via TRANSDERMAL
  Filled 2017-12-18 (×2): qty 1

## 2017-12-18 MED ORDER — ONDANSETRON 4 MG PO TBDP: 4.0000 mg | ORAL_TABLET | Freq: Four times a day (QID) | ORAL | Status: DC | PRN

## 2017-12-18 MED ORDER — ALLOPURINOL 100 MG PO TABS
100.0000 mg | ORAL_TABLET | Freq: Two times a day (BID) | ORAL | Status: DC
  Administered 2017-12-18 – 2017-12-21 (×5): 100 mg via ORAL
  Filled 2017-12-18 (×6): qty 1

## 2017-12-18 MED ORDER — OXYCODONE HCL 5 MG PO TABS
5.0000 mg | ORAL_TABLET | ORAL | Status: DC | PRN
  Administered 2017-12-18 – 2017-12-19 (×3): 10 mg via ORAL
  Administered 2017-12-19 – 2017-12-20 (×2): 5 mg via ORAL
  Administered 2017-12-20: 10 mg via ORAL
  Administered 2017-12-21: 5 mg via ORAL
  Filled 2017-12-18 (×2): qty 2
  Filled 2017-12-18: qty 1
  Filled 2017-12-18 (×4): qty 2

## 2017-12-18 MED ORDER — GABAPENTIN 300 MG PO CAPS
300.0000 mg | ORAL_CAPSULE | ORAL | Status: AC
  Administered 2017-12-19: 300 mg via ORAL
  Filled 2017-12-18: qty 1

## 2017-12-18 MED ORDER — CELECOXIB 200 MG PO CAPS
400.0000 mg | ORAL_CAPSULE | ORAL | Status: AC
  Administered 2017-12-19: 400 mg via ORAL
  Filled 2017-12-18: qty 2

## 2017-12-18 MED ORDER — ACETAMINOPHEN 650 MG RE SUPP: 650.0000 mg | Freq: Four times a day (QID) | RECTAL | Status: DC | PRN

## 2017-12-18 MED ORDER — HYDRALAZINE HCL 20 MG/ML IJ SOLN: 10.0000 mg | INTRAMUSCULAR | Status: DC | PRN

## 2017-12-18 MED ORDER — DIPHENHYDRAMINE HCL 50 MG/ML IJ SOLN: 25.0000 mg | Freq: Four times a day (QID) | INTRAMUSCULAR | Status: DC | PRN

## 2017-12-18 MED ORDER — ACETAMINOPHEN 325 MG PO TABS: 650.0000 mg | ORAL_TABLET | Freq: Four times a day (QID) | ORAL | Status: DC | PRN

## 2017-12-18 MED ORDER — CEFAZOLIN (ANCEF) 1 G IV SOLR
2.0000 g | INTRAVENOUS | Status: AC
  Administered 2017-12-19: 2 g

## 2017-12-18 MED ORDER — DIPHENHYDRAMINE HCL 25 MG PO CAPS: 25.0000 mg | ORAL_CAPSULE | Freq: Four times a day (QID) | ORAL | Status: DC | PRN

## 2017-12-18 MED ORDER — MORPHINE SULFATE (PF) 2 MG/ML IV SOLN
2.0000 mg | INTRAVENOUS | Status: DC | PRN
  Administered 2017-12-19: 2 mg via INTRAVENOUS
  Filled 2017-12-18: qty 1

## 2017-12-18 MED ORDER — INSULIN ASPART 100 UNIT/ML ~~LOC~~ SOLN
0.0000 [IU] | Freq: Three times a day (TID) | SUBCUTANEOUS | Status: DC
  Administered 2017-12-19: 3 [IU] via SUBCUTANEOUS
  Administered 2017-12-19 – 2017-12-20 (×2): 5 [IU] via SUBCUTANEOUS
  Administered 2017-12-21 (×2): 3 [IU] via SUBCUTANEOUS

## 2017-12-18 MED ORDER — ENOXAPARIN SODIUM 40 MG/0.4ML ~~LOC~~ SOLN: 40.0000 mg | SUBCUTANEOUS | Status: DC

## 2017-12-18 MED ORDER — ONDANSETRON HCL 4 MG/2ML IJ SOLN
4.0000 mg | Freq: Four times a day (QID) | INTRAMUSCULAR | Status: DC | PRN
Start: 2017-12-18 — End: 2017-12-21
  Administered 2017-12-19: 4 mg via INTRAVENOUS

## 2017-12-18 MED ORDER — SODIUM CHLORIDE 0.9 % IV SOLN
INTRAVENOUS | Status: DC
  Administered 2017-12-18: 22:00:00 via INTRAVENOUS

## 2017-12-18 MED ORDER — ENOXAPARIN SODIUM 40 MG/0.4ML ~~LOC~~ SOLN
40.0000 mg | SUBCUTANEOUS | Status: DC
  Administered 2017-12-20 – 2017-12-21 (×2): 40 mg via SUBCUTANEOUS
  Filled 2017-12-18 (×2): qty 0.4

## 2017-12-18 MED ORDER — FAMOTIDINE 20 MG PO TABS
20.0000 mg | ORAL_TABLET | Freq: Every day | ORAL | Status: DC
  Administered 2017-12-18 – 2017-12-21 (×4): 20 mg via ORAL
  Filled 2017-12-18 (×4): qty 1

## 2017-12-18 MED ORDER — LISINOPRIL 20 MG PO TABS
20.0000 mg | ORAL_TABLET | Freq: Every day | ORAL | Status: DC
  Administered 2017-12-20 – 2017-12-21 (×2): 20 mg via ORAL
  Filled 2017-12-18 (×3): qty 1

## 2017-12-18 NOTE — ED Notes (Signed)
ED TO INPATIENT HANDOFF REPORT  Name/Age/Gender Steven [name] 57 y.o. male  Code Status   Home/SNF/Other Home  Chief Complaint abd pain   Level of Care/Admitting Diagnosis ED Disposition    ED Disposition Condition [facility] Area: [place] [100102]  Level of Care: Med-Surg [16]  Diagnosis: Right inguinal hernia [937169]  Admitting Physician: [place], [place]  Attending Physician: CCS, MD [[id number]]  Estimated length of stay: past midnight tomorrow  Certification:: I certify this patient will need inpatient services for at least 2 midnights  Bed request comments: 5W  PT Class (Do Not Modify): Inpatient [101]  PT Acc Code (Do Not Modify): Private [1]       Medical History Past Medical History:  Diagnosis Date  . Chicken pox   . Diabetes mellitus without complication ([place])   . Hypertension     Allergies No Known Allergies  IV Location/Drains/Wounds Patient Lines/Drains/Airways Status   Active Line/Drains/Airways    Name:   Placement date:   Placement time:   Site:   Days:   Peripheral IV 12/18/17 Left Hand   12/18/17    1526    Hand   less than 1          Labs/Imaging Results for orders placed or performed during the hospital encounter of 12/18/17 (from the past 48 hour(s))  Urinalysis, Routine w reflex microscopic     Status: None   Collection Time: 12/18/17  2:43 PM  Result Value Ref Range   Color, Urine YELLOW YELLOW   APPearance CLEAR CLEAR   Specific Gravity, Urine 1.011 1.005 - 1.030   pH 5.0 5.0 - 8.0   Glucose, UA NEGATIVE NEGATIVE mg/dL   Hgb urine dipstick NEGATIVE NEGATIVE   Bilirubin Urine NEGATIVE NEGATIVE   Ketones, ur NEGATIVE NEGATIVE mg/dL   Protein, ur NEGATIVE NEGATIVE mg/dL   Nitrite NEGATIVE NEGATIVE   Leukocytes, UA NEGATIVE NEGATIVE    Comment: Performed at [facility], [place] [address], [place], [place] [address]  CBG monitoring, ED     Status: None   Collection  Time: 12/18/17  5:05 PM  Result Value Ref Range   Glucose-Capillary 70 70 - 99 mg/dL   Dg Knee Complete 4 Views Left  Result Date: 12/18/2017 CLINICAL DATA:  Generalized knee pain for the past 2 weeks with no known injury. EXAM: LEFT KNEE - COMPLETE 4+ VIEW COMPARISON:  Left knee radiographs of Oct 16, 2010 FINDINGS: The bones are subjectively adequately mineralized. There is no acute or healing fracture. There is beaking of the tibial spines. A small spur arises from the inferior articular margin of the patella. No joint effusion is observed. The joint spaces are reasonably well-maintained. 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 No acute bony abnormality nor significant joint space narrowing. Electronically Signed   By: David  Martinique M.D.   On: 12/18/2017 10:45    Pending Labs [employer] (From admission, onward)   Start     Ordered   Signed and Held  HIV antibody (Routine Testing)  Once,   R     Signed and Held   Signed and Held  CBC  (enoxaparin (LOVENOX)    CrCl >/= 30 ml/min)  Once,   R    Comments:  Baseline for enoxaparin therapy IF NOT ALREADY DRAWN.  Notify MD if PLT < 100 K.    Signed and Held   Signed and Held  Creatinine, serum  (enoxaparin (LOVENOX)    CrCl >/= 30 ml/min)  Once,   R    Comments:  Baseline for enoxaparin therapy IF NOT ALREADY DRAWN.    Signed and Held   Signed and Held  Creatinine, serum  (enoxaparin (LOVENOX)    CrCl >/= 30 ml/min)  Weekly,   R    Comments:  while on enoxaparin therapy    Signed and Held      Vitals/Pain Today's Vitals   12/18/17 1107 12/18/17 1141 12/18/17 1457 12/18/17 1700  BP:   113/61 104/77  Pulse:   (!) 52 64  Resp:   20   Temp:      TempSrc:      SpO2:   100% 100%  Weight:      Height:      PainSc: 10-Worst pain ever 2       Isolation Precautions No active isolations  Medications Medications  allopurinol (ZYLOPRIM) tablet 100 mg (100 mg Oral Given  12/18/17 1731)  lisinopril (PRINIVIL,ZESTRIL) tablet 20 mg (has no administration in time range)  insulin aspart (novoLOG) injection 0-15 Units (0 Units Subcutaneous Not Given 12/18/17 1729)    Mobility walks

## 2017-12-18 NOTE — ED Provider Notes (Signed)
[facility]-EMERGENCY DEPT Provider Note   CSN: [id number] Arrival date & time: 12/18/17  1047     History   Chief Complaint Chief Complaint  Patient presents with  . Abdominal Pain  . Nausea    HPI Efren Preast is a 57 y.o. male.  HPI   He reports that he has had lower abdominal discomfort for 1 month, which worsened yesterday.  He went to an urgent care yesterday, and today went to see his PCP for the same problem.  He was referred here for "CT scan."  He denies nausea, vomiting, fever, chills, diarrhea or constipation.  There is been no dysuria, urinary frequency, or hematuria.  He states that he has had a knot around his penis for 1 year and that this area is always swollen.  Yesterday he was able to eat, but his pain worsened after eating.  Today he was again able to eat and he had less pain after eating.  He states the pain worsens when he walks and he has to walk "bent over."  The pain gets better when he sits.  There are no other known modifying factors.  Past Medical History:  Diagnosis Date  . Chicken pox   . Diabetes mellitus without complication (HCC)   . Hypertension     Patient Active Problem List   Diagnosis Date Noted  . Peripheral edema 12/10/2017  . Wellness examination 06/11/2017  . 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 Inguinal hernia 04/24/2016  . Chest wall pain 07/02/2015  . Essential hypertension 10/14/2014  . Uncontrolled diabetes mellitus (HCC) 10/14/2014    Past Surgical History:  Procedure Laterality Date  . TONSILLECTOMY          Home Medications    Prior to Admission medications   Medication Sig Start Date End Date Taking? Authorizing Provider  allopurinol (ZYLOPRIM) 100 MG tablet Take 1 tablet (100 mg total) by mouth 2 (two) times daily. 12/10/17  Yes John, James W, MD  glipiZIDE (GLUCOTROL XL) 10 MG 24 hr tablet Take 1 tablet (10 mg total) by mouth daily with  breakfast. 06/11/17  Yes John, James W, MD  hydrochlorothiazide (HYDRODIURIL) 25 MG tablet 1/2 tab by mouth daily as needed for leg swelling 12/10/17  Yes John, James W, MD  indomethacin (INDOCIN) 25 MG capsule Take 1 capsule (25 mg total) by mouth 2 (two) times daily as needed. Patient taking differently: Take 25 mg by mouth 2 (two) times daily as needed for mild pain.  12/10/17  Yes John, James W, MD  lisinopril (PRINIVIL,ZESTRIL) 20 MG tablet TAKE 1 TABLET BY MOUTH EVERY DAY 09/20/17  Yes Shambley, Ashleigh N, NP  metFORMIN (GLUCOPHAGE) 1000 MG tablet take 1 tablet by mouth twice a day with food 06/11/17  Yes John, James W, MD  acetaminophen (TYLENOL) 500 MG tablet Take 1 tablet (500 mg total) by mouth every 6 (six) hours as needed. 12/17/17   Burky, Natalie B, NP  blood glucose meter kit and supplies KIT Dispense based on patient and insurance preference. Use up to four times daily as directed. (FOR ICD-9 250.00, 250.01). 07/08/15   Calone, Gregory D, FNP  Blood Glucose Monitoring Suppl (ONETOUCH VERIO FLEX SYSTEM) w/Device KIT 1 kit by Does not apply route daily. Use as directed to check blood sugar daily Dx E11.9 10/08/15   Calone, Gregory D, FNP  glucose blood (ONETOUCH VERIO) test strip 1 each by Other route 4 (four) times daily -    with meals and at bedtime. Use to check blood sugars four times a day Dx E11.9 10/08/15   Golden Circle, FNP  lovastatin (MEVACOR) 40 MG tablet Take 1 tablet (40 mg total) by mouth at bedtime. 12/10/17   [name], MD  ondansetron (ZOFRAN-ODT) 4 MG disintegrating tablet Take 1 tablet (4 mg total) by mouth every 8 (eight) hours as needed for nausea or vomiting. 12/17/17   Zigmund Gottron, NP  [facility] DELICA LANCETS 88E MISC Use to help check blood sugars four times a day Dx e11.9 10/08/15   Golden Circle, FNP  pioglitazone (ACTOS) 30 MG tablet Take 1 tablet (30 mg total) by mouth daily. Patient not taking: Reported on 12/18/2017 06/11/17   [name], MD     Family History Family History  Problem Relation Age of Onset  . Heart disease Mother   . Hypertension Mother   . Diabetes Mother   . Stroke Father   . Hypertension Father   . Stroke Brother     Social History Social History   Tobacco Use  . Smoking status: Former Smoker    Packs/day: 2.00    Years: 25.00    Pack years: 50.00    Types: Cigarettes  . Smokeless tobacco: Former Systems developer    Types: Chew  Substance Use Topics  . Alcohol use: Yes    Comment: occasionally  . Drug use: No     Allergies   Patient has no known allergies.   Review of Systems Review of Systems  All other systems reviewed and are negative.    Physical Exam Updated Vital Signs BP (!) 143/82 (BP Location: Right Arm)   Pulse 63   Temp 98.1 F (36.7 C) (Oral)   Resp 18   Ht 5' 8" (1.727 m)   Wt 99.8 kg (220 lb)   SpO2 99%   BMI 33.45 kg/m   Physical Exam  Constitutional: He is oriented to person, place, and time. He appears well-developed and well-nourished.  HENT:  Head: Normocephalic and atraumatic.  Right Ear: External ear normal.  Left Ear: External ear normal.  Eyes: Pupils are equal, round, and reactive to light. Conjunctivae and EOM are normal.  Neck: Normal range of motion and phonation normal. Neck supple.  Cardiovascular: Normal rate, regular rhythm and normal heart sounds.  Pulmonary/Chest: Effort normal and breath sounds normal. No respiratory distress. He exhibits no bony tenderness.  Abdominal: Soft. He exhibits no distension. There is no tenderness. There is no guarding.  Genitourinary:  Genitourinary Comments: Normal penis.  Mass in right hemiscrotum, and in the right inferior inguinal canal.  These masses seem discrete, both are somewhat tender.  The mass in the right hemiscrotum reduced with gentle pressure, with the patient experiencing some improvement in his overall discomfort in this region.  The mass in the right inguinal canal could not be reduced, with gentle  pressure and manipulation, by me over about 5 minutes.  Patient tolerated this procedure well.  Musculoskeletal: Normal range of motion.  Neurological: He is alert and oriented to person, place, and time. No cranial nerve deficit or sensory deficit. He exhibits normal muscle tone. Coordination normal.  Skin: Skin is warm, dry and intact.  Psychiatric: He has a normal mood and affect. His behavior is normal. Judgment and thought content normal.  Nursing note and vitals reviewed.    ED Treatments / Results  Labs (all labs ordered are listed, but only abnormal results are displayed) Labs Reviewed  LIPASE, BLOOD  COMPREHENSIVE METABOLIC PANEL  CBC  URINALYSIS, ROUTINE W REFLEX MICROSCOPIC    EKG None  Radiology Dg Knee Complete 4 Views Left  Result Date: 12/18/2017 CLINICAL DATA:  Generalized knee pain for the past 2 weeks with no known injury. EXAM: LEFT KNEE - COMPLETE 4+ VIEW COMPARISON:  Left knee radiographs of Oct 16, 2010 FINDINGS: The bones are subjectively adequately mineralized. There is no acute or healing fracture. There is beaking of the tibial spines. A small spur arises from the inferior articular margin of the patella. No joint effusion is observed. The joint spaces are reasonably well-maintained. 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 No acute bony abnormality nor significant joint space narrowing. Electronically Signed   By: David  Martinique M.D.   On: 12/18/2017 10:45    Procedures Hernia reduction Date/Time: 12/18/2017 1:05 PM Performed by: Daleen Bo, MD Authorized by: Daleen Bo, MD  Risks and benefits: risks, benefits and alternatives were discussed Consent given by: patient Patient understanding: patient states understanding of the procedure being performed Patient identity confirmed: verbally with patient Time out: Immediately prior to procedure a "time out" was called to verify the correct  patient, procedure, equipment, support staff and site/side marked as required. Local anesthesia used: no  Anesthesia: Local anesthesia used: no  Sedation: Patient sedated: no  Patient tolerance: Patient tolerated the procedure well with no immediate complications Comments: Reduction of mass in right hemiscrotum by me.  Patient tolerated this well.  Unable to reduce the mass in the right inguinal canal, inferior portion.    (including critical care time)  Medications Ordered in ED Medications - No data to display   Initial Impression / Assessment and Plan / ED Course  I have reviewed the triage vital signs and the nursing notes.  Pertinent labs & imaging results that were available during my care of the patient were reviewed by me and considered in my medical decision making (see chart for details).      Patient Vitals for the past 24 hrs:  BP Temp Temp src Pulse Resp SpO2 Height Weight  12/18/17 1102 (!) 143/82 98.1 F (36.7 C) Oral 63 18 99 % 5' 8" (1.727 m) 99.8 kg (220 lb)   General surgery consultation-He reports that his scrotal hernia has returned.  They plan on operative management.  2:59 PM Reevaluation with update and discussion. After initial assessment and treatment, an updated evaluation reveals no change in status.  He has been seen by surgery who plans on admitting him for operative repair tomorrow.Daleen Bo   Medical Decision Making: Right inguinal hernia, reduced and return in ED.  CRITICAL CARE-no Performed by: Daleen Bo   Nursing Notes Reviewed/ Care Coordinated Applicable Imaging Reviewed Interpretation of Laboratory Data incorporated into ED treatment   Admit by general surgery    Final Clinical Impressions(s) / ED Diagnoses   Final diagnoses:  None    ED Discharge Orders    None       Daleen Bo, MD 12/18/17 1500

## 2017-12-18 NOTE — ED Notes (Signed)
Urine culture sent to lab with UA. 

## 2017-12-18 NOTE — ED Triage Notes (Signed)
Patient here from [facility] with complaints of lower abdominal pain and nausea. Seen at [facility] for same on yesterday.

## 2017-12-18 NOTE — Progress Notes (Signed)
Steven [name] is a 57 y.o. male with the following history as recorded in EpicCare:  Patient Active Problem List   Diagnosis Date Noted  . Peripheral edema 12/10/2017  . Wellness examination 06/11/2017  . 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 Inguinal hernia 04/24/2016  . Chest wall pain 07/02/2015  . Essential hypertension 10/14/2014  . Uncontrolled diabetes mellitus ([place]) 10/14/2014    Current Outpatient Medications  Medication Sig Dispense Refill  . acetaminophen (TYLENOL) 500 MG tablet Take 1 tablet (500 mg total) by mouth every 6 (six) hours as needed. 30 tablet 0  . allopurinol (ZYLOPRIM) 100 MG tablet Take 1 tablet (100 mg total) by mouth 2 (two) times daily. 180 tablet 3  . blood glucose meter kit and supplies KIT Dispense based on patient and insurance preference. Use up to four times daily as directed. (FOR ICD-9 250.00, 250.01). 1 each 0  . Blood Glucose Monitoring Suppl ([place]) w/Device KIT 1 kit by Does not apply route daily. Use as directed to check blood sugar daily Dx E11.9 1 kit 0  . glipiZIDE (GLUCOTROL XL) 10 MG 24 hr tablet Take 1 tablet (10 mg total) by mouth daily with breakfast. 90 tablet 3  . glucose blood (ONETOUCH VERIO) test strip 1 each by Other route 4 (four) times daily -  with meals and at bedtime. Use to check blood sugars four times a day Dx E11.9 125 each 11  . hydrochlorothiazide (HYDRODIURIL) 25 MG tablet 1/2 tab by mouth daily as needed for leg swelling 90 tablet 3  . indomethacin (INDOCIN) 25 MG capsule Take 1 capsule (25 mg total) by mouth 2 (two) times daily as needed. (Patient taking differently: Take 25 mg by mouth 2 (two) times daily as needed for mild pain. ) 30 capsule 2  . lisinopril (PRINIVIL,ZESTRIL) 20 MG tablet TAKE 1 TABLET BY MOUTH EVERY DAY 90 tablet 0  . lovastatin (MEVACOR) 40 MG tablet Take 1 tablet (40 mg total) by mouth at bedtime. 90 tablet 3  . metFORMIN (GLUCOPHAGE)  1000 MG tablet take 1 tablet by mouth twice a day with food 180 tablet 3  . ondansetron (ZOFRAN-ODT) 4 MG disintegrating tablet Take 1 tablet (4 mg total) by mouth every 8 (eight) hours as needed for nausea or vomiting. 12 tablet 0  . ONETOUCH DELICA LANCETS 09O MISC Use to help check blood sugars four times a day Dx e11.9 200 each 1  . pioglitazone (ACTOS) 30 MG tablet Take 1 tablet (30 mg total) by mouth daily. (Patient not taking: Reported on 12/18/2017) 90 tablet 3   No current facility-administered medications for this visit.     Allergies: Patient has no known allergies.  Past Medical History:  Diagnosis Date  . Chicken pox   . Diabetes mellitus without complication ([place])   . Hypertension     Past Surgical History:  Procedure Laterality Date  . TONSILLECTOMY      Family History  Problem Relation Age of Onset  . Heart disease Mother   . Hypertension Mother   . Diabetes Mother   . Stroke Father   . Hypertension Father   . Stroke Brother     Social History   Tobacco Use  . Smoking status: Former Smoker    Packs/day: 2.00    Years: 25.00    Pack years: 50.00    Types: Cigarettes  . Smokeless tobacco: Former Systems developer    Types: Adult nurse  Topics  . Alcohol use: Yes    Comment: occasionally    Subjective:  Patient presents with concerns for "lower abdominal pain" x 2 weeks; seemed to worsen last night- went to [facility] last night and was given Zofran with some benefit; notes that pain level went from a dull 4 to a severe 8-9; was told that needed to get CT for further evaluation; CBC done at [facility] last night showed early onset anemia; + nausea but no vomiting; denies any blood seen in stool; no coffee grounds emesis; does not remember date of last colonoscopy;   Also complaining of left knee pain x 1 week; would like to get X-ray updated;    Objective:  Vitals:   12/18/17 0919  BP: 136/70  Pulse: 61  Temp: 98.4 F (36.9 C)  TempSrc: Oral  SpO2: 98%  Weight: 218 lb  6.4 oz (99.1 kg)  Height: _0  (1.702 m)    General: Well developed, well nourished, in no acute distress  Skin : Warm and dry.  Head: Normocephalic and atraumatic  Lungs: Respirations unlabored; clear to auscultation bilaterally without wheeze, rales, rhonchi  CVS exam: normal rate and regular rhythm.  Abdomen: Soft; nontender; nondistended; normoactive bowel sounds; no masses or hepatosplenomegaly  Musculoskeletal: No deformities; no active joint inflammation  Extremities: No edema, cyanosis, clubbing  Vessels: Symmetric bilaterally  Neurologic: Alert and oriented; speech intact; face symmetrical; moves all extremities well; CNII-XII intact without focal deficit  Assessment:  1. Abdominal pain, unspecified abdominal location   2. Acute pain of left knee     Plan:  1. Unable to get CT scheduled for patient in outpatient setting today; Due to the sudden onset of severe pain and new onset anemia, feel that ER evaluation is appropriate; 2. Update left knee X-ray per patient request.   No follow-ups on file.  Orders Placed This Encounter  Procedures  . CT Abdomen Pelvis W Contrast    Lori/LY-Self pay. Has 2 bottles. DM on Glipizide and Metformin. BUN 16 CRE 1.22 on 12/10/17 Aware in office    Standing Status:   Future    Standing Expiration Date:   03/21/2019    Order Specific Question:   If indicated for the ordered procedure, I authorize the administration of contrast media per Radiology protocol    Answer:   Yes    Order Specific Question:   Preferred imaging location?    Answer:   [address]    Order Specific Question:   Is Oral Contrast requested for this exam?    Answer:   Yes, Per Radiology protocol    Order Specific Question:   Radiology Contrast Protocol - do NOT remove file path    Answer:   \\[email or web address].pdf  . DG Knee Complete 4 Views Left    Standing Status:   Future    Number of Occurrences:   1    Standing Expiration Date:    02/19/2019    Order Specific Question:   Reason for Exam (SYMPTOM  OR DIAGNOSIS REQUIRED)    Answer:   knee pain    Order Specific Question:   Preferred imaging location?    Answer:   [facility]    Order Specific Question:   Radiology Contrast Protocol - do NOT remove file path    Answer:   \\[email or web address]\[email or web address].[email or web address]    Requested Prescriptions    No prescriptions requested or ordered in this encounter

## 2017-12-18 NOTE — H&P (Signed)
[facility] Admission Note  Steven [name] Jan 14, 1961  [id number].    Requesting MD: Christ Kick Chief Complaint/Reason for Consult: inguinal hernia  HPI:  Pt is a 57 yo AA male with a Hx of poorly controlled DM, HTN who presented to [facility] earlier today with pelvic pain. Patient states that he has had a known inguinal hernia for at least a few years. It will swell and cause pain intermittently, but typically pain resolves without intervention. Not related to activity or lifting. States that yesterday he started having lower abdominal pain and it would not resolve. Went to urgent care and they ordered a CT scan, which he came to the ED today to have done. EDP was able to partially reduce the right inguinal hernia, and initially patient felt somewhat better but then the swelling returned. Denies any nausea, vomiting, fever, chills. Not passing flatus but he did have a BM this morning. Patient also reports intermittent dysuria x3 months, most recently 1 month ago. No hematuria. No dysuria at this time.  No prior abdominal surgeries. Usually has mild pain with working. Pt works Architect. Pt does not smoke  ROS:  Review of Systems  Constitutional: Negative.   HENT: Negative.   Eyes: Negative.   Respiratory: Negative.   Cardiovascular: Negative.   Gastrointestinal: Positive for abdominal pain. Negative for constipation, diarrhea, nausea and vomiting.  Genitourinary: Positive for dysuria.  Musculoskeletal: Negative.   Skin: Negative.   Neurological: Negative.      Family History  Problem Relation Age of Onset  . Heart disease Mother   . Hypertension Mother   . Diabetes Mother   . Stroke Father   . Hypertension Father   . Stroke Brother     Past Medical History:  Diagnosis Date  . Chicken pox   . Diabetes mellitus without complication ([place])   . Hypertension     Past Surgical History:  Procedure Laterality Date  . TONSILLECTOMY      Social History:  reports  that he has quit smoking. His smoking use included cigarettes. He has a 50.00 pack-year smoking history. He has quit using smokeless tobacco. His smokeless tobacco use included chew. He reports that he drinks alcohol. He reports that he does not use drugs.  Allergies: No Known Allergies   (Not in a hospital admission)  Blood pressure (!) 143/82, pulse 63, temperature 98.1 F (36.7 C), temperature source Oral, resp. rate 18, height 5\' 8"  (1.727 m), weight 99.8 kg (220 lb), SpO2 99 %.  Physical Exam  Constitutional: He is oriented to person, place, and time. He appears well-developed and well-nourished.  Non-toxic appearance. He does not appear ill. No distress.  HENT:  Head: Normocephalic and atraumatic.  Mouth/Throat: Oropharynx is clear and moist.  Eyes: Pupils are equal, round, and reactive to light. EOM are normal. No scleral icterus.  Cardiovascular: Normal rate, regular rhythm and normal heart sounds.  Pulmonary/Chest: Effort normal and breath sounds normal. No stridor. No respiratory distress. He has no wheezes. He has no rhonchi.  Abdominal: Soft. Normal appearance and bowel sounds are normal. There is no hepatosplenomegaly. There is tenderness. There is no rigidity and no guarding. A hernia is present. Hernia confirmed positive in the right inguinal area.  Mild lower pelvic TTP. Edema noted to right hemiscrotum with partially reducible right inguinal hernia  Genitourinary: Right testis shows swelling and tenderness.  Neurological: He is alert and oriented to person, place, and time.  Skin: Skin is warm and dry. No rash noted.  He is not diaphoretic.  Psychiatric: He has a normal mood and affect. His behavior is normal.  Nursing note and vitals reviewed.   Results for orders placed or performed during the hospital encounter of 12/17/17 (from the past 48 hour(s))  POCT urinalysis dip (device)     Status: None   Collection Time: 12/17/17  7:49 PM  Result Value Ref Range   Glucose,  UA NEGATIVE NEGATIVE mg/dL   Bilirubin Urine NEGATIVE NEGATIVE   Ketones, ur NEGATIVE NEGATIVE mg/dL   Specific Gravity, Urine 1.010 1.005 - 1.030   Hgb urine dipstick NEGATIVE NEGATIVE   pH 5.5 5.0 - 8.0   Protein, ur NEGATIVE NEGATIVE mg/dL   Urobilinogen, UA 0.2 0.0 - 1.0 mg/dL   Nitrite NEGATIVE NEGATIVE   Leukocytes, UA NEGATIVE NEGATIVE    Comment: Biochemical Testing Only. Please order routine urinalysis from main lab if confirmatory testing is needed.  CBC     Status: Abnormal   Collection Time: 12/17/17  8:07 PM  Result Value Ref Range   WBC 9.0 4.0 - 10.5 K/uL   RBC 4.59 4.22 - 5.81 MIL/uL   Hemoglobin 11.2 (L) 13.0 - 17.0 g/dL   HCT 36.5 (L) 39.0 - 52.0 %   MCV 79.5 78.0 - 100.0 fL   MCH 24.4 (L) 26.0 - 34.0 pg   MCHC 30.7 30.0 - 36.0 g/dL   RDW 15.6 (H) 11.5 - 15.5 %   Platelets 371 150 - 400 K/uL    Comment: Performed at [facility] [address], [place],  [address]   Dg Knee Complete 4 Views Left  Result Date: 12/18/2017 CLINICAL DATA:  Generalized knee pain for the past 2 weeks with no known injury. EXAM: LEFT KNEE - COMPLETE 4+ VIEW COMPARISON:  Left knee radiographs of Oct 16, 2010 FINDINGS: The bones are subjectively adequately mineralized. There is no acute or healing fracture. There is beaking of the tibial spines. A small spur arises from the inferior articular margin of the patella. No joint effusion is observed. The joint spaces are reasonably well-maintained. 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 No acute bony abnormality nor significant joint space narrowing. Electronically Signed   By: David  Martinique M.D.   On: 12/18/2017 10:45      Assessment/Plan HTN - home meds DMII poorly controlled - SSI Gout - allopurinol  Incarcerated right inguinal hernia - Patient seen and examined with Dr. Ninfa Linden. Hernia is symptomatic and not fully reducible so we will admit him to med-surg and  plan for Right inguinal hernia repair with mesh tomorrow. [place] for diet today, NPO after midnight  Wellington Hampshire, [facility] 12/18/2017, 2:51 PM Pager: [phone] Consults: [phone] Mon 7:00 am -11:30 AM Tues-Fri 7:00 am-4:30 pm Sat-Sun 7:00 am-11:30 am

## 2017-12-18 NOTE — Telephone Encounter (Signed)
Per Lanelle Bal NP pt should follow up with PCP and possibly GI. Pt called and made aware. Verbalized understanding.

## 2017-12-19 ENCOUNTER — Inpatient Hospital Stay (HOSPITAL_COMMUNITY): Payer: Self-pay | Admitting: Certified Registered Nurse Anesthetist

## 2017-12-19 ENCOUNTER — Encounter (HOSPITAL_COMMUNITY): Admission: EM | Disposition: A | Discharge status: Home / Self Care | Payer: Self-pay | Source: Home / Self Care

## 2017-12-19 ENCOUNTER — Telehealth: Payer: Self-pay | Admitting: Internal Medicine

## 2017-12-19 ENCOUNTER — Inpatient Hospital Stay: Admission: RE | Admit: 2017-12-19 | Payer: Self-pay | Source: Ambulatory Visit

## 2017-12-19 ENCOUNTER — Encounter (HOSPITAL_COMMUNITY): Payer: Self-pay | Admitting: Certified Registered Nurse Anesthetist

## 2017-12-19 HISTORY — PX: MASS EXCISION: SHX2000

## 2017-12-19 HISTORY — PX: INSERTION OF MESH: SHX5868

## 2017-12-19 HISTORY — PX: INGUINAL HERNIA REPAIR: SHX194

## 2017-12-19 LAB — GLUCOSE, CAPILLARY
GLUCOSE-CAPILLARY: 95 mg/dL (ref 70–99)
Glucose-Capillary: 195 mg/dL — ABNORMAL HIGH (ref 70–99)
Glucose-Capillary: 136 mg/dL — ABNORMAL HIGH (ref 70–99)
Glucose-Capillary: 152 mg/dL — ABNORMAL HIGH (ref 70–99)
Glucose-Capillary: 249 mg/dL — ABNORMAL HIGH (ref 70–99)
Glucose-Capillary: 222 mg/dL — ABNORMAL HIGH (ref 70–99)

## 2017-12-19 LAB — MRSA PCR SCREENING: MRSA by PCR: NEGATIVE

## 2017-12-19 LAB — HIV ANTIBODY (ROUTINE TESTING W REFLEX): HIV Screen 4th Generation wRfx: NONREACTIVE

## 2017-12-19 SURGERY — REPAIR, HERNIA, INGUINAL, ADULT
Anesthesia: General | Site: Groin | Laterality: Right

## 2017-12-19 MED ORDER — LACTATED RINGERS IV SOLN
INTRAVENOUS | Status: DC
  Administered 2017-12-19 (×2): via INTRAVENOUS

## 2017-12-19 MED ORDER — PHENYLEPHRINE 40 MCG/ML (10ML) SYRINGE FOR IV PUSH (FOR BLOOD PRESSURE SUPPORT)
PREFILLED_SYRINGE | INTRAVENOUS | Status: AC
  Filled 2017-12-19: qty 10

## 2017-12-19 MED ORDER — DEXAMETHASONE SODIUM PHOSPHATE 10 MG/ML IJ SOLN
INTRAMUSCULAR | Status: DC | PRN
  Administered 2017-12-19: 4 mg via INTRAVENOUS

## 2017-12-19 MED ORDER — BUPIVACAINE-EPINEPHRINE 0.5% -1:200000 IJ SOLN
INTRAMUSCULAR | Status: DC | PRN
  Administered 2017-12-19: 10 mL

## 2017-12-19 MED ORDER — PROMETHAZINE HCL 25 MG/ML IJ SOLN: 6.2500 mg | INTRAMUSCULAR | Status: DC | PRN

## 2017-12-19 MED ORDER — EPHEDRINE SULFATE-NACL 50-0.9 MG/10ML-% IV SOSY
PREFILLED_SYRINGE | INTRAVENOUS | Status: DC | PRN
  Administered 2017-12-19: 5 mg via INTRAVENOUS

## 2017-12-19 MED ORDER — SUGAMMADEX SODIUM 200 MG/2ML IV SOLN
INTRAVENOUS | Status: DC | PRN
  Administered 2017-12-19: 400 mg via INTRAVENOUS

## 2017-12-19 MED ORDER — ROCURONIUM BROMIDE 10 MG/ML (PF) SYRINGE
PREFILLED_SYRINGE | INTRAVENOUS | Status: AC
  Filled 2017-12-19: qty 10

## 2017-12-19 MED ORDER — FENTANYL CITRATE (PF) 100 MCG/2ML IJ SOLN
INTRAMUSCULAR | Status: AC
  Administered 2017-12-19: 100 ug via INTRAVENOUS
  Filled 2017-12-19: qty 2

## 2017-12-19 MED ORDER — EPHEDRINE 5 MG/ML INJ
INTRAVENOUS | Status: AC
  Filled 2017-12-19: qty 10

## 2017-12-19 MED ORDER — DEXAMETHASONE SODIUM PHOSPHATE 10 MG/ML IJ SOLN
INTRAMUSCULAR | Status: AC
  Filled 2017-12-19: qty 1

## 2017-12-19 MED ORDER — CEFAZOLIN SODIUM-DEXTROSE 2-4 GM/100ML-% IV SOLN
INTRAVENOUS | Status: AC
  Filled 2017-12-19: qty 100

## 2017-12-19 MED ORDER — ENSURE PRE-SURGERY PO LIQD
296.0000 mL | Freq: Once | ORAL | Status: AC
  Administered 2017-12-19: 296 mL via ORAL
  Filled 2017-12-19: qty 296

## 2017-12-19 MED ORDER — 0.9 % SODIUM CHLORIDE (POUR BTL) OPTIME
TOPICAL | Status: DC | PRN
Start: 2017-12-19 — End: 2017-12-19
  Administered 2017-12-19: 1000 mL

## 2017-12-19 MED ORDER — FENTANYL CITRATE (PF) 100 MCG/2ML IJ SOLN
50.0000 ug | INTRAMUSCULAR | Status: DC
  Administered 2017-12-19: 100 ug via INTRAVENOUS

## 2017-12-19 MED ORDER — PHENYLEPHRINE 40 MCG/ML (10ML) SYRINGE FOR IV PUSH (FOR BLOOD PRESSURE SUPPORT)
PREFILLED_SYRINGE | INTRAVENOUS | Status: DC | PRN
  Administered 2017-12-19 (×3): 80 ug via INTRAVENOUS

## 2017-12-19 MED ORDER — LIP MEDEX EX OINT
TOPICAL_OINTMENT | CUTANEOUS | Status: AC
  Filled 2017-12-19: qty 7

## 2017-12-19 MED ORDER — MIDAZOLAM HCL 2 MG/2ML IJ SOLN
1.0000 mg | INTRAMUSCULAR | Status: DC
  Administered 2017-12-19: 2 mg via INTRAVENOUS

## 2017-12-19 MED ORDER — ONDANSETRON HCL 4 MG/2ML IJ SOLN
INTRAMUSCULAR | Status: AC
  Filled 2017-12-19: qty 2

## 2017-12-19 MED ORDER — FENTANYL CITRATE (PF) 250 MCG/5ML IJ SOLN
INTRAMUSCULAR | Status: AC
  Filled 2017-12-19: qty 5

## 2017-12-19 MED ORDER — PROPOFOL 10 MG/ML IV BOLUS
INTRAVENOUS | Status: DC | PRN
  Administered 2017-12-19: 150 mg via INTRAVENOUS

## 2017-12-19 MED ORDER — MIDAZOLAM HCL 5 MG/5ML IJ SOLN
INTRAMUSCULAR | Status: DC | PRN
  Administered 2017-12-19: 2 mg via INTRAVENOUS

## 2017-12-19 MED ORDER — SUGAMMADEX SODIUM 500 MG/5ML IV SOLN
INTRAVENOUS | Status: AC
  Filled 2017-12-19: qty 5

## 2017-12-19 MED ORDER — FENTANYL CITRATE (PF) 100 MCG/2ML IJ SOLN
INTRAMUSCULAR | Status: AC
  Administered 2017-12-19: 50 ug via INTRAVENOUS
  Filled 2017-12-19: qty 2

## 2017-12-19 MED ORDER — MIDAZOLAM HCL 2 MG/2ML IJ SOLN
INTRAMUSCULAR | Status: AC
  Administered 2017-12-19: 2 mg via INTRAVENOUS
  Filled 2017-12-19: qty 2

## 2017-12-19 MED ORDER — LIDOCAINE 2% (20 MG/ML) 5 ML SYRINGE
INTRAMUSCULAR | Status: DC | PRN
  Administered 2017-12-19: 100 mg via INTRAVENOUS

## 2017-12-19 MED ORDER — BUPIVACAINE-EPINEPHRINE (PF) 0.5% -1:200000 IJ SOLN
INTRAMUSCULAR | Status: DC | PRN
  Administered 2017-12-19: 30 mL via PERINEURAL

## 2017-12-19 MED ORDER — FENTANYL CITRATE (PF) 250 MCG/5ML IJ SOLN
INTRAMUSCULAR | Status: DC | PRN
  Administered 2017-12-19 (×2): 50 ug via INTRAVENOUS
  Administered 2017-12-19: 100 ug via INTRAVENOUS
  Administered 2017-12-19: 50 ug via INTRAVENOUS

## 2017-12-19 MED ORDER — ROCURONIUM BROMIDE 10 MG/ML (PF) SYRINGE
PREFILLED_SYRINGE | INTRAVENOUS | Status: DC | PRN
  Administered 2017-12-19: 20 mg via INTRAVENOUS
  Administered 2017-12-19: 50 mg via INTRAVENOUS

## 2017-12-19 MED ORDER — MIDAZOLAM HCL 2 MG/2ML IJ SOLN
INTRAMUSCULAR | Status: AC
  Filled 2017-12-19: qty 2

## 2017-12-19 MED ORDER — BUPIVACAINE-EPINEPHRINE (PF) 0.5% -1:200000 IJ SOLN
INTRAMUSCULAR | Status: AC
  Filled 2017-12-19: qty 30

## 2017-12-19 MED ORDER — FENTANYL CITRATE (PF) 100 MCG/2ML IJ SOLN
25.0000 ug | INTRAMUSCULAR | Status: DC | PRN
  Administered 2017-12-19 (×2): 50 ug via INTRAVENOUS

## 2017-12-19 SURGICAL SUPPLY — 32 items
ADH SKN CLS APL DERMABOND .7 (GAUZE/BANDAGES/DRESSINGS) ×1
BLADE SURG 15 STRL LF DISP TIS (BLADE) ×1 IMPLANT
BLADE SURG 15 STRL SS (BLADE) ×3
CHLORAPREP W/TINT 26ML (MISCELLANEOUS) ×3 IMPLANT
DECANTER SPIKE VIAL GLASS SM (MISCELLANEOUS) IMPLANT
DERMABOND ADVANCED (GAUZE/BANDAGES/DRESSINGS) ×2
DERMABOND ADVANCED .7 DNX12 (GAUZE/BANDAGES/DRESSINGS) ×1 IMPLANT
DRAIN PENROSE 18X1/2 LTX STRL (DRAIN) ×3 IMPLANT
DRAPE LAPAROTOMY TRNSV 102X78 (DRAPE) ×3 IMPLANT
ELECT PENCIL ROCKER SW 15FT (MISCELLANEOUS) ×3 IMPLANT
ELECT REM PT RETURN 15FT ADLT (MISCELLANEOUS) ×3 IMPLANT
GAUZE SPONGE 4X4 12PLY STRL (GAUZE/BANDAGES/DRESSINGS) IMPLANT
GLOVE SURG SIGNA 7.5 PF LTX (GLOVE) ×3 IMPLANT
GOWN STRL REUS W/TWL XL LVL3 (GOWN DISPOSABLE) ×6 IMPLANT
KIT BASIN OR (CUSTOM PROCEDURE TRAY) ×3 IMPLANT
MESH PARIETEX PROGRIP RIGHT (Mesh General) ×2 IMPLANT
NDL HYPO 25X1 1.5 SAFETY (NEEDLE) ×1 IMPLANT
NEEDLE HYPO 25X1 1.5 SAFETY (NEEDLE) ×3 IMPLANT
PACK BASIC VI WITH GOWN DISP (CUSTOM PROCEDURE TRAY) ×3 IMPLANT
SPONGE LAP 4X18 RFD (DISPOSABLE) ×3 IMPLANT
SUT MNCRL AB 4-0 PS2 18 (SUTURE) ×3 IMPLANT
SUT SILK 2 0 SH (SUTURE) ×2 IMPLANT
SUT VIC AB 2-0 CT1 27 (SUTURE) ×3
SUT VIC AB 2-0 CT1 TAPERPNT 27 (SUTURE) IMPLANT
SUT VIC AB 3-0 54XBRD REEL (SUTURE) IMPLANT
SUT VIC AB 3-0 BRD 54 (SUTURE)
SUT VIC AB 3-0 SH 27 (SUTURE) ×3
SUT VIC AB 3-0 SH 27XBRD (SUTURE) IMPLANT
SYR CONTROL 10ML LL (SYRINGE) ×3 IMPLANT
TOWEL OR 17X26 10 PK STRL BLUE (TOWEL DISPOSABLE) ×3 IMPLANT
TOWEL OR NON WOVEN STRL DISP B (DISPOSABLE) ×3 IMPLANT
YANKAUER SUCT BULB TIP 10FT TU (MISCELLANEOUS) ×2 IMPLANT

## 2017-12-19 NOTE — Plan of Care (Signed)
  Problem: Education: Goal: Knowledge of General Education information will improve Description Including pain rating scale, medication(s)/side effects and non-pharmacologic comfort measures Outcome: Progressing   Problem: Health Behavior/Discharge Planning: Goal: Ability to manage health-related needs will improve Outcome: Progressing   Problem: Clinical Measurements: Goal: Will remain free from infection Outcome: Progressing   Problem: Nutrition: Goal: Adequate nutrition will be maintained Outcome: Progressing   Problem: Coping: Goal: Level of anxiety will decrease Outcome: Progressing   

## 2017-12-19 NOTE — Telephone Encounter (Signed)
Unable to address at this moment as pt just underwent hernia repair surgury and has a surgical attending for now until d/c  I will try to follow and make suggestion after pt d/c from hospital

## 2017-12-19 NOTE — Progress Notes (Signed)
Danna notified pt will be returning to room in 20 minutes

## 2017-12-19 NOTE — Transfer of Care (Signed)
Immediate Anesthesia Transfer of Care Note  Patient: Steven [name]  Procedure(s) Performed: HERNIA REPAIR INGUINAL RIGHT (Right Groin) INSERTION OF MESH (Right Groin) EXCISION OF RIGHT SCROTAL MASS (Right Groin)  Patient Location: PACU  Anesthesia Type:General  Level of Consciousness: awake, alert , oriented and patient cooperative  Airway & Oxygen Therapy: Patient Spontanous Breathing and Patient connected to face mask oxygen  Post-op Assessment: Report given to RN, Post -op Vital signs reviewed and stable and Patient moving all extremities  Post vital signs: Reviewed and stable  Last Vitals:  Vitals Value Taken Time  BP 135/77 12/19/2017  1:50 PM  Temp    Pulse 73 12/19/2017  1:51 PM  Resp 15 12/19/2017  1:51 PM  SpO2 100 % 12/19/2017  1:51 PM  Vitals shown include unvalidated device data.  Last Pain:  Vitals:   12/19/17 1101  TempSrc: Oral  PainSc:       Patients Stated Pain Goal: 0 (44/92/01 0071)  Complications: No apparent anesthesia complications

## 2017-12-19 NOTE — Telephone Encounter (Signed)
Copied from [place] [id number]. Topic: Quick Communication - See Telephone Encounter >> Dec 19, 2017  4:31 PM Vernona Rieger wrote: CRM for notification. See Telephone encounter for: 12/19/17.  Jeannine diabetes nurse at [facility] called to give some information to Dr Jenny Reichmann that the patient shared with her. He did give her permission to share this with Dr Jenny Reichmann. The patient said Dr Jenny Reichmann wanted him to start on insulin at home, but has not started it yet. He is currently homeless and living out of his car. He said he does not have a place to store the insulin. Please consider some other option until he finds a place to live. [phone] is his number.

## 2017-12-19 NOTE — Anesthesia Procedure Notes (Signed)
Procedure Name: Intubation Date/Time: 12/19/2017 12:40 PM Performed by: Mitzie Na, CRNA Pre-anesthesia Checklist: Patient identified, Emergency Drugs available, Suction available, Patient being monitored and Timeout performed Patient Re-evaluated:Patient Re-evaluated prior to induction Oxygen Delivery Method: Circle system utilized Preoxygenation: Pre-oxygenation with 100% oxygen Induction Type: IV induction Ventilation: Mask ventilation without difficulty Laryngoscope Size: Mac and 4 Grade View: Grade I Tube size: 7.5 mm Number of attempts: 1 Airway Equipment and Method: Stylet Placement Confirmation: ETT inserted through vocal cords under direct vision,  positive ETCO2 and breath sounds checked- equal and bilateral Secured at: 22 cm Tube secured with: Tape Dental Injury: Teeth and Oropharynx as per pre-operative assessment

## 2017-12-19 NOTE — Progress Notes (Signed)
AssistedDr. Gifford Shave with right, ultrasound guided, transabdominal plane block. Side rails up, monitors on throughout procedure. See vital signs in flow sheet. Tolerated Procedure well.

## 2017-12-19 NOTE — Anesthesia Postprocedure Evaluation (Signed)
Anesthesia Post Note  Patient: Raymont Andreoni  Procedure(s) Performed: HERNIA REPAIR INGUINAL RIGHT (Right Groin) INSERTION OF MESH (Right Groin) EXCISION OF RIGHT SCROTAL MASS (Right Groin)     Patient location during evaluation: PACU Anesthesia Type: General Level of consciousness: awake and alert Pain management: pain level controlled Vital Signs Assessment: post-procedure vital signs reviewed and stable Respiratory status: spontaneous breathing, nonlabored ventilation and respiratory function stable Cardiovascular status: blood pressure returned to baseline and stable Postop Assessment: no apparent nausea or vomiting Anesthetic complications: no    Last Vitals:  Vitals:   12/19/17 1451 12/19/17 1512  BP:  126/74  Pulse:  (!) 55  Resp:  16  Temp:  36.7 C  SpO2: 97% 100%    Last Pain:  Vitals:   12/19/17 1512  TempSrc: Oral  PainSc:                  Catalina Gravel

## 2017-12-19 NOTE — Anesthesia Preprocedure Evaluation (Addendum)
Anesthesia Evaluation  Patient identified by MRN, date of birth, ID band Patient awake    Reviewed: Allergy & Precautions, NPO status , Patient's Chart, lab work & pertinent test results  Airway Mallampati: II  TM Distance: >3 FB Neck ROM: Full    Dental  (+) Teeth Intact, Dental Advisory Given, Missing   Pulmonary former smoker,    Pulmonary exam normal breath sounds clear to auscultation       Cardiovascular hypertension, Pt. on medications (-) angina(-) CAD and (-) Past MI Normal cardiovascular exam Rhythm:Regular Rate:Normal     Neuro/Psych negative neurological ROS  negative psych ROS   GI/Hepatic Neg liver ROS, RIH   Endo/Other  diabetes, Type 2, Oral Hypoglycemic AgentsObesity   Renal/GU negative Renal ROS     Musculoskeletal  (+) Arthritis ,   Abdominal   Peds  Hematology  (+) Blood dyscrasia, anemia ,   Anesthesia Other Findings Day of surgery medications reviewed with the patient.  Reproductive/Obstetrics                            Anesthesia Physical Anesthesia Plan  ASA: II  Anesthesia Plan: General   Post-op Pain Management:  Regional for Post-op pain   Induction: Intravenous  PONV Risk Score and Plan: 3 and Midazolam, Dexamethasone, Ondansetron and Scopolamine patch - Pre-op  Airway Management Planned: Oral ETT  Additional Equipment:   Intra-op Plan:   Post-operative Plan: Extubation in OR  Informed Consent: I have reviewed the patients History and Physical, chart, labs and discussed the procedure including the risks, benefits and alternatives for the proposed anesthesia with the patient or authorized representative who has indicated his/her understanding and acceptance.   Dental advisory given  Plan Discussed with: CRNA  Anesthesia Plan Comments:        Anesthesia Quick Evaluation

## 2017-12-19 NOTE — Progress Notes (Signed)
Inpatient Diabetes Program Recommendations  AACE/ADA: New Consensus Statement on Inpatient Glycemic Control (2015)  Target Ranges:  Prepandial:   less than 140 mg/dL      Peak postprandial:   less than 180 mg/dL (1-2 hours)      Critically ill patients:  140 - 180 mg/dL   Results for HAVOC, SANLUIS (MRN [id number]) as of 12/19/2017 10:00  Ref. Range 12/18/2017 17:05 12/18/2017 23:00 12/19/2017 07:08  Glucose-Capillary Latest Ref Range: 70 - 99 mg/dL 70 208 (H) 195 (H)  3 units NOVOLOG    Results for DENZIL, MCEACHRON (MRN [id number]) as of 12/19/2017 10:00  Ref. Range 06/11/2017 15:57 12/10/2017 10:37  Hemoglobin A1C Latest Ref Range: 4.6 - 6.5 % 10.2 9.8 (H)     Admit with: Incarcerated right inguinal hernia  History: DM  Home DM Meds: Actos 30 mg daily       Glipizide 10 mg daily       Metformin 1000 mg BID  Current Insulin Orders: Novolog Moderate Correction Scale/ SSI (0-15 units) TID AC     Note patient met with his PCP, Dr. Cathlean Cower with [facility] on 12/10/17.  At that visit, Dr. Jenny Reichmann wanted to start patient on insulin at home.  Not sure that insulin was ever started prior to admission to this hospitalization.  Patient currently uninsured and will need affordable insulin at time of discharge.  Will try to speak with patient regarding plan for insulin for home.     --Will follow patient during hospitalization--  Wyn Quaker RN, MSN, CDE Diabetes Coordinator Inpatient Glycemic Control Team Team Pager: [phone] (8a-5p)

## 2017-12-19 NOTE — Progress Notes (Signed)
Results for [name], Steven [name] (MRN [id number]) as of 12/19/2017 16:27  Ref. Range 06/11/2017 15:57 12/10/2017 10:37  Hemoglobin A1C Latest Ref Range: 4.6 - 6.5 % 10.2 9.8 (H)    Met with pt today to discuss home DM medication.  Per records, patient saw his PCP Dr. Cathlean Cower with [facility] on 12/10/17.  At that visit, Dr. Jenny Reichmann wanted to start patient on insulin at home, however, per patient, he was never started on Insulin due to cost issues and storage issues.  Patient told me he is currently homeless and is living out of his car.  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  Eats all meals at fast food restaurants and diners.  Is taking his 3 oral DM meds regularly (gets meds for an affordable price at [facility]).  Does not have CBG meter.  Does not have an appropriate place to store CBG meter or insulin.  Discussed with pt that he needs access to a refrigerator to appropriately store insulin and that opened insulin vials can be stored at room temperature, but cannot be left in the car.  Pt told me he is actively trying to find a place to live.  Encouraged pt to purchase a CBG OTC at [facility] once he establishes a place to live.  Also encouraged pt to start insulin for home with his PCP once he establishes a place to live.  Patient gave me permission to contact his PCP's office to let them know that he is currently homeless and does not have access to appropriate storage for his insulin.  May want to delay starting insulin until pt finds a place to live.  Also discussed DM diet information with patient.  Encouraged patient to avoid beverages with sugar (regular soda, sweet tea, lemonade, fruit juice) and to consume mostly water.  Discussed what foods contain carbohydrates and how carbohydrates affect the body's blood sugar levels.  Encouraged patient to be careful with his portion sizes (especially grains, starchy vegetables, and fruits).  Explained to patient that men should have 60-75 grams of  carbohydrates per meal per day.  Discussed ways to try to make healthy choices when eating at fast food restaurants and diners.  Also encouraged pt to switch from Gatorade to either G2 drinks or better yet Power Ade zero if he desires an electrolyte drink.      --Will follow patient during hospitalization--  Wyn Quaker RN, MSN, CDE Diabetes Coordinator Inpatient Glycemic Control Team Team Pager: [phone] (8a-5p)

## 2017-12-19 NOTE — Anesthesia Procedure Notes (Signed)
Anesthesia Regional Block: TAP block   Pre-Anesthetic Checklist: ,, timeout performed, Correct Patient, Correct Site, Correct Laterality, Correct Procedure, Correct Position, site marked, Risks and benefits discussed,  Surgical consent,  Pre-op evaluation,  At surgeon's request and post-op pain management  Laterality: Right  Prep: chloraprep       Needles:  Injection technique: Single-shot  Needle Type: Echogenic Needle     Needle Length: 9cm  Needle Gauge: 21     Additional Needles:   Procedures:,,,, ultrasound used (permanent image in chart),,,,  Narrative:  Start time: 12/19/2017 11:47 AM End time: 12/19/2017 11:52 AM Injection made incrementally with aspirations every 5 mL.  Performed by: Personally  Anesthesiologist: Catalina Gravel, MD  Additional Notes: No pain on injection. No increased resistance to injection. Injection made in 5cc increments.  Good needle visualization.  Patient tolerated procedure well.

## 2017-12-19 NOTE — Op Note (Signed)
HERNIA REPAIR INGUINAL RIGHT, INSERTION OF MESH, EXCISION OF RIGHT SCROTAL MASS  Procedure Note  Steven [name] 12/18/2017 - 12/19/2017   Pre-op Diagnosis: right inguinal hernia     Post-op Diagnosis: right inguinal hernia, right scrotal mass  Procedure(s): HERNIA REPAIR INGUINAL RIGHT INSERTION OF MESH EXCISION OF RIGHT SCROTAL MASS (10 cm x 12 cm)  Surgeon(s): Coralie Keens, MD  Anesthesia: General  Staff:  Circulator: Barbee Shropshire, RN Physician Assistant: Kalman Drape, PA Scrub Person: [name], CST  Estimated Blood Loss: Minimal               Specimens: large subcutaneous scrotal mass sent to path  Indications: This is [name] 57 year old gentleman who presented with an incarcerated right inguinal hernia.  The hernia could be partly reduced but there was still [name] large mass at the inguinal canal at the very top of the scrotum.  The decision was made to proceed to the operating room  Findings: The patient was found to have [name] large indirect right inguinal hernia.  There was also [name] separate 10 cm x 12 cm subcutaneous mass at the very superior aspect of the scrotum coming up into the abdominal wall.  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  Procedure: The patient was brought to the operating room and identified as the correct patient.  He was placed upon the operating table and general anesthesia was induced.  His abdomen and scrotum were then prepped and draped in usual sterile fashion.  I made [name] longitudinal incision in the patient's right inguinal area with [name] scalpel.  I took this down through Scarpa's fascia with electrocautery.  I then opened the external oblique fascia toward the internal and external rings.  I easily controlled the testicular cord and structures with [name] Penrose drain.  The patient had [name] large thickened indirect hernia sac.  It became apparent that  the hernia had been reduced and there was still [name] large mass at the most superior aspect of the scrotum on the right side.  I evaluated this further.  It is almost appeared consistent with [name] large lipoma.  I was able to excise it in its entirety with the electrocautery.  Again, it was in the subcutaneous tissue and did not appear to enter the abdomen.  I was able to completely excise it and then sent to pathology for evaluation. Next I separated the thickened hernia sac from the cord structures.  I dissected the hernia sac down toward the base.  All contents were reduced.  I then tied off the base of the sac with [name] 2-0 silk suture.  I then excised the redundant sac.  Next [name] large piece of Prolene pro-grip mesh was brought to the field.  I placed it up against the pubic tubercle and brought around the cord structures and secured it to the inguinal floor.  I placed several Vicryl sutures tacking the mesh in place.  Wide coverage of the inguinal floor and good coverage of the internal ring appeared to be achieved.  I then closed the external oblique fascia over the top of this with [name] running 2-0 Vicryl suture.  I anesthetized the skin further with Marcaine.  Scarpa's fascia was then closed with interrupted 3-0 Vicryl sutures and the skin was closed with [name] running 4-0 Monocryl.  Dermabond was then applied.  The patient tolerated procedure well.  All  the counts were correct at the end of the procedure.  The patient was then extubated in the operating room and taken in [name] stable condition to the recovery room.          Steven [name]   Date: 12/19/2017  Time: 1:29 PM

## 2017-12-19 NOTE — Progress Notes (Signed)
Patient ID: Steven [name], male   DOB: 1961-02-10, 57 y.o.   MRN: [id number]   Pre Procedure note for inpatients:   Steven [name] has been scheduled for Procedure(s): HERNIA REPAIR INGUINAL RIGHT (Right) today. The various methods of treatment have been discussed with the patient. After consideration of the risks, benefits and treatment options the patient has consented to the planned procedure.   The patient has been seen and labs reviewed. There are no changes in the patient's condition to prevent proceeding with the planned procedure today.  Recent labs:  Lab Results  Component Value Date   WBC 7.8 12/18/2017   HGB 11.7 (L) 12/18/2017   HCT 36.9 (L) 12/18/2017   PLT 361 12/18/2017   GLUCOSE 224 (H) 12/10/2017   CHOL 187 12/10/2017   TRIG 178.0 (H) 12/10/2017   HDL 36.80 (L) 12/10/2017   LDLCALC 114 (H) 12/10/2017   ALT 23 12/10/2017   AST 13 12/10/2017   NA 136 12/10/2017   K 4.0 12/10/2017   CL 102 12/10/2017   CREATININE 0.93 12/18/2017   BUN 16 12/10/2017   CO2 25 12/10/2017   HGBA1C 9.8 (H) 12/10/2017   MICROALBUR 0.9 11/16/2016    Hadleigh Felber A, MD 12/19/2017 10:55 AM

## 2017-12-20 ENCOUNTER — Telehealth: Payer: Self-pay

## 2017-12-20 ENCOUNTER — Encounter (HOSPITAL_COMMUNITY): Payer: Self-pay | Admitting: Surgery

## 2017-12-20 ENCOUNTER — Other Ambulatory Visit: Payer: Self-pay

## 2017-12-20 LAB — BASIC METABOLIC PANEL
ANION GAP: 7 (ref 5–15)
BUN: 12 mg/dL (ref 6–20)
CALCIUM: 8.8 mg/dL — AB (ref 8.9–10.3)
CO2: 27 mmol/L (ref 22–32)
Chloride: 107 mmol/L (ref 98–111)
Creatinine, Ser: 0.88 mg/dL (ref 0.61–1.24)
Glucose, Bld: 128 mg/dL — ABNORMAL HIGH (ref 70–99)
Potassium: 4.1 mmol/L (ref 3.5–5.1)
Sodium: 141 mmol/L (ref 135–145)

## 2017-12-20 LAB — CBC
HCT: 36.1 % — ABNORMAL LOW (ref 39.0–52.0)
HEMOGLOBIN: 11.5 g/dL — AB (ref 13.0–17.0)
MCH: 25.2 pg — ABNORMAL LOW (ref 26.0–34.0)
MCHC: 31.9 g/dL (ref 30.0–36.0)
MCV: 79.2 fL (ref 78.0–100.0)
Platelets: 369 10*3/uL (ref 150–400)
RBC: 4.56 MIL/uL (ref 4.22–5.81)
RDW: 15.6 % — ABNORMAL HIGH (ref 11.5–15.5)
WBC: 9.1 10*3/uL (ref 4.0–10.5)

## 2017-12-20 LAB — GLUCOSE, CAPILLARY
GLUCOSE-CAPILLARY: 127 mg/dL — AB (ref 70–99)
GLUCOSE-CAPILLARY: 235 mg/dL — AB (ref 70–99)
GLUCOSE-CAPILLARY: 128 mg/dL — AB (ref 70–99)

## 2017-12-20 MED ORDER — IBUPROFEN 400 MG PO TABS
400.0000 mg | ORAL_TABLET | Freq: Four times a day (QID) | ORAL | Status: DC
  Administered 2017-12-20 – 2017-12-21 (×4): 400 mg via ORAL
  Filled 2017-12-20 (×5): qty 1

## 2017-12-20 MED ORDER — ACETAMINOPHEN 500 MG PO TABS
1000.0000 mg | ORAL_TABLET | Freq: Three times a day (TID) | ORAL | Status: DC
  Administered 2017-12-20 – 2017-12-21 (×2): 1000 mg via ORAL
  Filled 2017-12-20 (×3): qty 2

## 2017-12-20 NOTE — Care Management Note (Signed)
Case Management Note  Patient Details  Name: Steven [name] MRN: [id number] Date of Birth: 1960/11/07  Subjective/Objective:CM referral for med asst. Spoke to patient in rm about d/c plans-he plans to d/c home. 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 No further CM needs.                    Action/Plan: D/c home  Expected Discharge Date:  (unknown)               Expected Discharge Plan:  Home/Self Care  In-House Referral:     Discharge planning Services  CM Consult, Medication Assistance  Post Acute Care Choice:    Choice offered to:     DME Arranged:    DME Agency:     HH Arranged:    HH Agency:     Status of Service:  In process, will continue to follow  If discussed at Long Length of Stay Meetings, dates discussed:    Additional Comments:  Dessa Phi, RN 12/20/2017, 3:15 PM

## 2017-12-20 NOTE — Telephone Encounter (Signed)
Called patient back this morning and spoke with him regarding his knee xrays results and for him to see sports med is symptoms persisted. He requested that I have you call him today if you had a second to speak with him. I asked him a few times what it was in reference too but he said it was personal matter and he didn't want to discuss.

## 2017-12-20 NOTE — Discharge Instructions (Signed)
[facility] _______[facility] ° °UMBILICAL OR INGUINAL HERNIA REPAIR: POST OP INSTRUCTIONS ° °Always review your discharge instruction sheet given to you by the facility where your surgery was performed. °IF YOU HAVE DISABILITY OR FAMILY LEAVE FORMS, YOU MUST BRING THEM TO THE OFFICE FOR PROCESSING.   °DO NOT GIVE THEM TO YOUR DOCTOR. ° °1. A  prescription for pain medication may be given to you upon discharge.  Take your pain medication as prescribed, if needed.  If narcotic pain medicine is not needed, then you may take acetaminophen (Tylenol) or ibuprofen (Advil) as needed. °2. Take your usually prescribed medications unless otherwise directed. °If you need a refill on your pain medication, please contact your pharmacy.  They will contact our office to request authorization. Prescriptions will not be filled after 5 pm or on week-ends. °3. You should follow a light diet the first 24 hours after arrival home, such as soup and crackers, etc.  Be sure to include lots of fluids daily.  Resume your normal diet the day after surgery. °4.Most patients will experience some swelling and bruising around the umbilicus or in the groin and scrotum.  Ice packs and reclining will help.  Swelling and bruising can take several days to resolve.  °6. It is common to experience some constipation if taking pain medication after surgery.  Increasing fluid intake and taking a stool softener (such as Colace) will usually help or prevent this problem from occurring.  A mild laxative (Milk of Magnesia or Miralax) should be taken according to package directions if there are no bowel movements after 48 hours. °7. Unless discharge instructions indicate otherwise, you may remove your bandages 24-48 hours after surgery, and you may shower at that time.  You may have steri-strips (small skin tapes) in place directly over the incision.  These strips should be left on the skin for 7-10 days.  If your surgeon used skin glue on the  incision, you may shower in 24 hours.  The glue will flake off over the next 2-3 weeks.  Any sutures or staples will be removed at the office during your follow-up visit. °8. ACTIVITIES:  You may resume regular (light) daily activities beginning the next day--such as daily self-care, walking, climbing stairs--gradually increasing activities as tolerated.  You may have sexual intercourse when it is comfortable.  Refrain from any heavy lifting or straining until approved by your doctor. ° °a.You may drive when you are no longer taking prescription pain medication, you can comfortably wear a seatbelt, and you can safely maneuver your car and apply brakes. °b.RETURN TO WORK:   °_____________________________________________ ° °9.You should see your doctor in the office for a follow-up appointment approximately 2-3 weeks after your surgery.  Make sure that you call for this appointment within a day or two after you arrive home to insure a convenient appointment time. °10.OTHER INSTRUCTIONS: _________________________ °   _____________________________________ ° °WHEN TO CALL YOUR DOCTOR: °1. Fever over 101.0 °2. Inability to urinate °3. Nausea and/or vomiting °4. Extreme swelling or bruising °5. Continued bleeding from incision. °6. Increased pain, redness, or drainage from the incision ° °The clinic staff is available to answer your questions during regular business hours.  Please don’t hesitate to call and ask to speak to one of the nurses for clinical concerns.  If you have a medical emergency, go to the nearest emergency room or call 911.  A surgeon from [facility] is always on call at the hospital ° ° °  [address], [address], [place], [place]  [address] ? ° [address] [address], [place], [place]   [address] °[phone] ? [phone] ? FAX [phone] °Web site: [email or web address] °

## 2017-12-20 NOTE — Progress Notes (Signed)
[facility] Progress Note  1 Day Post-Op  Subjective: CC: scrotal pain Patient complaining of pain in R scrotum/testicle. States it is as painful as it was prior to surgery. Denies penile discharge or difficulty urinating. States he has had this problem for 17 years. Tolerating diet. Denies nausea, no flatus yet. Has not been walking yet.   Objective: Vital signs in last 24 hours: Temp:  [98.1 F (36.7 C)-99.2 F (37.3 C)] 99.2 F (37.3 C) (07/25 0725) Pulse Rate:  [47-82] 61 (07/25 0725) Resp:  [12-24] 16 (07/25 0725) BP: (98-145)/(55-89) 134/71 (07/25 0725) SpO2:  [92 %-100 %] 99 % (07/24 2311) Last BM Date: 12/19/17  Intake/Output from previous day: 07/24 0701 - 07/25 0700 In: 3681.3 [P.O.:720; I.V.:2961.3] Out: 2635 [Urine:2625; Blood:10] Intake/Output this shift: No intake/output data recorded.  PE: Gen:  Alert, NAD, pleasant Card:  Regular rate and rhythm, pedal pulses 2+ BL Pulm:  Normal effort, clear to auscultation bilaterally Abd: Soft, non-tender, non-distended, bowel sounds present, no HSM, incision C/D/I GU: R scrotum mildly edematous and TTP, both testes present, no penile discharge or edema Skin: warm and dry, no rashes  Psych: A&Ox3   Lab Results:  Recent Labs    12/17/17 2007 12/18/17 2045  WBC 9.0 7.8  HGB 11.2* 11.7*  HCT 36.5* 36.9*  PLT 371 361   BMET Recent Labs    12/18/17 2045  CREATININE 0.93   PT/INR No results for input(s): LABPROT, INR in the last 72 hours. CMP     Component Value Date/Time   NA 136 12/10/2017 1037   K 4.0 12/10/2017 1037   CL 102 12/10/2017 1037   CO2 25 12/10/2017 1037   GLUCOSE 224 (H) 12/10/2017 1037   BUN 16 12/10/2017 1037   CREATININE 0.93 12/18/2017 2045   CALCIUM 9.0 12/10/2017 1037   PROT 7.0 12/10/2017 1037   ALBUMIN 4.0 12/10/2017 1037   AST 13 12/10/2017 1037   ALT 23 12/10/2017 1037   ALKPHOS 51 12/10/2017 1037   BILITOT 0.4 12/10/2017 1037   GFRNONAA >60 12/18/2017 2045   GFRAA >60 12/18/2017 2045   Lipase  No results found for: LIPASE     Studies/Results: Dg Knee Complete 4 Views Left  Result Date: 12/18/2017 CLINICAL DATA:  Generalized knee pain for the past 2 weeks with no known injury. EXAM: LEFT KNEE - COMPLETE 4+ VIEW COMPARISON:  Left knee radiographs of Oct 16, 2010 FINDINGS: The bones are subjectively adequately mineralized. There is no acute or healing fracture. There is beaking of the tibial spines. A small spur arises from the inferior articular margin of the patella. No joint effusion is observed. The joint spaces are reasonably well-maintained. 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 No acute bony abnormality nor significant joint space narrowing. Electronically Signed   By: David  Martinique M.D.   On: 12/18/2017 10:45    Anti-infectives: Anti-infectives (From admission, onward)   Start     Dose/Rate Route Frequency Ordered Stop   12/19/17 1120  ceFAZolin (ANCEF) 2-4 GM/100ML-% IVPB    Note to Pharmacy:  Bridget Hartshorn   : cabinet override      12/19/17 1120 12/19/17 2329   12/19/17 0600  ceFAZolin (ANCEF) powder 2 g     2 g Other On call to O.R. 12/18/17 2024 12/19/17 1243       Assessment/Plan HTN - home meds DMII poorly controlled - SSI Gout - allopurinol  Incarcerated right inguinal hernia S/p  open R inguinal hernia repair with mesh and excision of R scrotal mass - POD#1 - ice/elevate scrotum - ambulate and get patient OOB - repeat CBC and BMET post-op   FEN: CM diet VTE: SCDs, lovenox ID: ancef periop  Plan: labs pending. Pt needs to ambulate. CM consult for medication assistance. Possible discharge later today vs tomorrow AM.   LOS: 2 days    Brigid Re , [facility] 12/20/2017, 8:50 AM Pager: [phone] Consults: [phone] Mon-Fri 7:00 am-4:30 pm Sat-Sun 7:00 am-11:30 am

## 2017-12-20 NOTE — Progress Notes (Signed)
Patient continues to complain of scrotal pain with mobility. Encouraged patient to keep scrotum elevated and apply ice per PA and RN recommendations. Patient also encouraged to ambulate.   RN has explained to patient that pain in scrotum was from the surgery, and this has been confirmed both by the PA and the surgical MD. Patient continues to states that "I had this pain before the hernia". RN continues to try to educate the patient that the mass could have been pressing on a nerve that is causing scrotal pain and swelling. RN also affirms to patient that swelling is the common inflammatory process after this type of surgery.   Patient continues to state "I want this to be scanned" and "im not leaving like this"  Medical team aware of patients concerns and will address with patient again tomorrow.

## 2017-12-20 NOTE — Telephone Encounter (Signed)
Patient expressed concern that his left knee is still bothering him; explained that while he is in the hospital recovering from surgery, we cannot start any new medications. He will let us know when he is discharged and can get him to see sports medicine. He expresses understanding.

## 2017-12-20 NOTE — Progress Notes (Signed)
RN rounded this evening and patient states that "my scrotum is more swollen, and the ice did it"  I assess the patient after he expressed his concerns, and his scrotum looked the same as it was during my AM assessment this morning. Patient states that "it is different, and tighter."  RN explained to the patient to purpose of the ice, and that swelling can increase and decease based on patient position, activity, and the inflammatory process.   Patient continues to say "the ice caused it, and my scrotum is more swollen, I don't want that ice"  I continued to try to educate the patient about the importance of mobility, and ice, medications that have been added to help with the inflammation, as well as the inflammatory process and risk factors after inguinal hernia surgery.   Patient is not accepting education from RN. I informed patient that he could discuss this with the oncoming RN again to see if he understood more clearly when she explains and tries to answer his questions/concerns.   RN assured patient that the MD/PA are aware of his complaints and his concerns about his scrotum.

## 2017-12-20 NOTE — Progress Notes (Signed)
Met with pt again today.  Encouraged pt to please call his PCP within 1 week of discharge to further discuss DM medication adjustments.  Pt not able to safely store insulin (currently homeless and living in car).  Called pt's PCP office yesterday to alert them to this.  Pt's PCP stated he would follow up with patient as well to make oral DM medication adjustment after d/c.  Encouraged pt again to purchase CBG meter OTC at [facility] once he establishes a permanent residence.     --Will follow patient during hospitalization--  Wyn Quaker RN, MSN, CDE Diabetes Coordinator Inpatient Glycemic Control Team Team Pager: [phone] (8a-5p)

## 2017-12-20 NOTE — Telephone Encounter (Signed)
Noted  

## 2017-12-21 DIAGNOSIS — R1909 Other intra-abdominal and pelvic swelling, mass and lump: Secondary | ICD-10-CM

## 2017-12-21 LAB — GLUCOSE, CAPILLARY
GLUCOSE-CAPILLARY: 162 mg/dL — AB (ref 70–99)
GLUCOSE-CAPILLARY: 169 mg/dL — AB (ref 70–99)
Glucose-Capillary: 210 mg/dL — ABNORMAL HIGH (ref 70–99)

## 2017-12-21 MED ORDER — POLYETHYLENE GLYCOL 3350 17 G PO PACK
17.0000 g | PACK | Freq: Every day | ORAL | Status: DC
  Administered 2017-12-21: 17 g via ORAL
  Filled 2017-12-21: qty 1

## 2017-12-21 MED ORDER — DOCUSATE SODIUM 100 MG PO CAPS
100.0000 mg | ORAL_CAPSULE | Freq: Every day | ORAL | 0 refills | Status: DC
Start: 2017-12-21 — End: 2019-12-26

## 2017-12-21 MED ORDER — DOCUSATE SODIUM 100 MG PO CAPS
100.0000 mg | ORAL_CAPSULE | Freq: Every day | ORAL | Status: DC
  Administered 2017-12-21: 100 mg via ORAL
  Filled 2017-12-21: qty 1

## 2017-12-21 MED ORDER — OXYCODONE HCL 5 MG PO TABS: 5.0000 mg | ORAL_TABLET | Freq: Four times a day (QID) | ORAL | 0 refills | Status: DC | PRN

## 2017-12-21 MED ORDER — POLYETHYLENE GLYCOL 3350 17 G PO PACK: 17.0000 g | PACK | Freq: Every day | ORAL | 0 refills | Status: DC

## 2017-12-21 NOTE — Care Management Note (Signed)
Case Management Note  Patient Details  Name: Steven [name] MRN: [id number] Date of Birth: 07/10/1960  Subjective/Objective: No further CM needs.  See prior CM note.                 Action/Plan:d/c home.   Expected Discharge Date:  12/21/17               Expected Discharge Plan:  Home/Self Care  In-House Referral:     Discharge planning Services  CM Consult, Medication Assistance  Post Acute Care Choice:    Choice offered to:     DME Arranged:    DME Agency:     HH Arranged:    HH Agency:     Status of Service:  Completed, signed off  If discussed at [employer] of Stay Meetings, dates discussed:    Additional Comments:  Dessa Phi, RN 12/21/2017, 12:02 PM

## 2017-12-21 NOTE — Progress Notes (Signed)
RN explained discharge papers and reinforced the resources case manager talked to him about.

## 2017-12-21 NOTE — Clinical Social Work Note (Signed)
Clinical Social Work Assessment  Patient Details  Name: Steven [name] MRN: [id number] Date of Birth: 1961/01/02  Date of referral:  12/21/17               Reason for consult:  Housing Concerns/Homelessness                Permission sought to share information with:    Permission granted to share information::  No  Name::        Agency::     Relationship::     Contact Information:     Housing/Transportation Living arrangements for the past 2 months:  No permanent address Source of Information:  Patient Patient Interpreter Needed:  None Criminal Activity/Legal Involvement Pertinent to Current Situation/Hospitalization:  No - Comment as needed Significant Relationships:  Other Family Members, Friend Lives with:  Self Do you feel safe going back to the place where you live?  Yes Need for family participation in patient care:  No (Coment)  Care giving concerns:  Patient reports that he is currently homeless and living out of his care. Patient is unemployed and does not have insurance. Patient appears well kept.    Social Worker assessment / plan:  LCSW consulted for homeless issues.   Patient admits that he has been living in his car for the past 6 months. Prior to that he reports that he was renting a house in [place], [place]. Currently patient reports that he is unemployed. 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   LCSW provided patient with resources for shelters and affordable senior housing.   PLAN: Patient is willing to follow up on resources at dc.   Employment status:  Unemployed Forensic scientist:  Self Pay (Medicaid Pending) PT Recommendations:  Not assessed at this time Information / Referral to community resources:     Patient/Family's Response to care:  Patient is thankful for homeless resources.   Patient/Family's Understanding of and Emotional Response to Diagnosis, Current Treatment, and Prognosis:  Patient reports that he has friends and family in  the area that he could probably stay with, but states " I would rather do it on my on".   Emotional Assessment Appearance:  Appears younger than stated age Attitude/Demeanor/Rapport:    Affect (typically observed):  Calm, Accepting, Pleasant Orientation:  Oriented to Self, Oriented to Place, Oriented to  Time, Oriented to Situation Alcohol / Substance use:  Alcohol Use(Self reported) Psych involvement (Current and /or in the community):  No (Comment)  Discharge Needs  Concerns to be addressed:  Homelessness Readmission within the last 30 days:  No Current discharge risk:  None Barriers to Discharge:  No Barriers Identified   Servando Snare, LCSW 12/21/2017, 2:25 PM

## 2017-12-21 NOTE — Discharge Summary (Signed)
[facility] Discharge Summary   Patient ID: Steven [name] MRN: [id number] DOB/AGE: 1961/04/20 57 y.o.  Admit date: 12/18/2017 Discharge date: 12/21/2017  Admitting Diagnosis: R inguinal hernia  Discharge Diagnosis Patient Active Problem List   Diagnosis Date Noted  . Mass of right inguinal region s/p excision 12/19/2017 12/21/2017  . Incarcerated right inguinal hernia s/p repair 12/19/2017 12/18/2017  . Peripheral edema 12/10/2017  . Wellness examination 06/11/2017  . 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 Chest wall pain 07/02/2015  . Essential hypertension 10/14/2014  . Uncontrolled diabetes mellitus ([place]) 10/14/2014    Consultants none  Imaging: No results found.  Procedures Dr. Ninfa Linden (12/19/17) - hernia repair inguinal right with insertion of mesh, excision of right scrotal mass (10x12cm)   HPI: Pt is a 57 yo AA male with a Hx of poorly controlled DM, HTN who presented to [facility] earlier today with pelvic pain. Patient states that he has had a known inguinal hernia for at least a few years. It will swell and cause pain intermittently, but typically pain resolves without intervention. Not related to activity or lifting. States that yesterday he started having lower abdominal pain and it would not resolve. Went to urgent care and they ordered a CT scan, which he came to the ED today to have done. EDP was able to partially reduce the right inguinal hernia, and initially patient felt somewhat better but then the swelling returned. Denies any nausea, vomiting, fever, chills. Not passing flatus but he did have a BM this morning. Patient also reports intermittent dysuria x3 months, most recently 1 month ago. No hematuria. No dysuria at this time.  No prior abdominal surgeries. Usually has mild pain with working. Pt works Architect. Pt does not smoke  Hospital Course:  Workup showed R inguinal hernia.  Patient was  admitted and underwent procedure listed above.  Tolerated procedure well and was transferred to the floor.  Diet was advanced as tolerated. Pathology discussed with pt. On POD#2, the patient was voiding well, tolerating diet, ambulating well, pain well controlled, vital signs stable, incisions c/d/i and felt stable for discharge home. Patient will follow up in our office as outlined below and knows to call with questions or concerns. Pt knows to follow up with his PCP regarding his DM medications.   Patient was discharged in good condition.  The [place] Substance controlled database was reviewed prior to prescribing narcotic pain medication to this patient.  Physical Exam: General:  Alert, NAD, pleasant, cooperative Cardio: RRR, S1 & S2 normal, no murmur, rubs, gallops Resp: Effort normal, lungs CTA bilaterally, no wheezes, rales, rhonchi Abd:  Soft, ND, normal bowel sounds, no tenderness GU: incision is well appearing without drainage or signs of infection. No swelling around incisions. Moderate scrotal swelling without erythema. Mild TTP of R teste.  Skin: warm and dry, no rashes noted  Allergies as of 12/21/2017   No Known Allergies     Medication List    STOP taking these medications   pioglitazone 30 MG tablet Commonly known as:  ACTOS     TAKE these medications   acetaminophen 500 MG tablet Commonly known as:  TYLENOL Take 1 tablet (500 mg total) by mouth every 6 (six) hours as needed.   allopurinol 100 MG tablet Commonly known as:  ZYLOPRIM Take 1 tablet (100 mg total) by mouth 2 (two) times daily.   blood glucose meter kit and supplies Kit Dispense based on patient and insurance  preference. Use up to four times daily as directed. (FOR ICD-9 250.00, 250.01).   docusate sodium 100 MG capsule Commonly known as:  COLACE Take 1 capsule (100 mg total) by mouth daily.   glipiZIDE 10 MG 24 hr tablet Commonly known as:  GLUCOTROL XL Take 1 tablet (10 mg total) by mouth  daily with breakfast.   glucose blood test strip Commonly known as:  ONETOUCH VERIO 1 each by Other route 4 (four) times daily -  with meals and at bedtime. Use to check blood sugars four times a day Dx E11.9   hydrochlorothiazide 25 MG tablet Commonly known as:  HYDRODIURIL 1/2 tab by mouth daily as needed for leg swelling   indomethacin 25 MG capsule Commonly known as:  INDOCIN Take 1 capsule (25 mg total) by mouth 2 (two) times daily as needed. What changed:  reasons to take this   lisinopril 20 MG tablet Commonly known as:  PRINIVIL,ZESTRIL TAKE 1 TABLET BY MOUTH EVERY DAY   lovastatin 40 MG tablet Commonly known as:  MEVACOR Take 1 tablet (40 mg total) by mouth at bedtime.   metFORMIN 1000 MG tablet Commonly known as:  GLUCOPHAGE take 1 tablet by mouth twice a day with food   ondansetron 4 MG disintegrating tablet Commonly known as:  ZOFRAN-ODT Take 1 tablet (4 mg total) by mouth every 8 (eight) hours as needed for nausea or vomiting.   ONETOUCH DELICA LANCETS 73X Misc Use to help check blood sugars four times a day Dx e11.9   ONETOUCH VERIO FLEX SYSTEM w/Device Kit 1 kit by Does not apply route daily. Use as directed to check blood sugar daily Dx E11.9   oxyCODONE 5 MG immediate release tablet Commonly known as:  Oxy IR/ROXICODONE Take 1 tablet (5 mg total) by mouth every 6 (six) hours as needed for moderate pain.   polyethylene glycol packet Commonly known as:  MIRALAX / GLYCOLAX Take 17 g by mouth daily.        Follow-up Information    Coralie Keens, MD. Go on 01/09/2018.   Specialty:  General Surgery Why:  Appointment scheduled for 1:30 PM. Please arrive 30 min prior to appointment time. Bring photo ID and insurance information.  Contact information: [address] [place] [address] [phone]        [name], MD. Schedule an appointment as soon as possible for a visit in 1 week(s).   Specialties:  Internal Medicine,  Radiology Why:  to discuss diabetes medications Contact information: [place] [place] [address] [phone]           Signed: [facility] 12/21/2017, 11:18 AM Pager: [phone] Consults: [phone] Mon-Fri 7:00 am-4:30 pm Sat-Sun 7:00 am-11:30 am

## 2017-12-24 NOTE — Telephone Encounter (Signed)
Ok for Steven [name] to contact pt if possible  1) ask - If he has been checking his sugars and know if currently they are more in the 100's or 200's or higher  2)  Ask if he has been taking the glucotrol and metformin (and stopped the actos) as per his recent d/c  If his sugars are in the 100;s he can stay on the 2 meds and call for further if his sugars become more than 200  If his sugars are in the 200's, let me know as we could restart the actos  I think we should hold on insulin at this point since he may not have storage for this (no refrigerator)

## 2017-12-28 ENCOUNTER — Telehealth: Payer: Self-pay | Admitting: Internal Medicine

## 2017-12-28 NOTE — Telephone Encounter (Signed)
Copied from [place] [id number]. Topic: General - Other >> Dec 28, 2017 12:50 PM Carolyn Stare wrote:  Pt call to say he has no where to keep insulin and the hospital told him to  reach out to the doctor to see if there is something else he can RX  [phone]

## 2017-12-28 NOTE — Telephone Encounter (Signed)
Patient advised.

## 2017-12-28 NOTE — Telephone Encounter (Signed)
Routing to dr Jenny Reichmann, I looked back at previous telephone encounter---patient does not have any way to check glucose---he has no way to store the equipment inside, he lives outside in the heat----please advise if you are sending a new medication or just have him continue with current tx, I will call patient back, thanks

## 2017-12-28 NOTE — Telephone Encounter (Signed)
Already addressed, see previous message

## 2017-12-28 NOTE — Telephone Encounter (Signed)
Routing back to dr john---patient does not have a refrigerator to store insulin in ----he is currently taking metformin and glipizide----what else do you want him to do, he's not going to be able to start insulin like you recommended (insulin was to be provided OTC by [facility])---please advise, I will call patient back, thanks

## 2017-12-28 NOTE — Telephone Encounter (Signed)
Even though he would have difficulty with checking sugars, He should be ok with taking all meds as prescribed, no changes today

## 2018-01-09 ENCOUNTER — Ambulatory Visit (HOSPITAL_COMMUNITY)
Admission: EM | Admit: 2018-01-09 | Discharge: 2018-01-09 | Disposition: A | Discharge status: Home / Self Care | Payer: Self-pay | Attending: Family Medicine | Admitting: Family Medicine

## 2018-01-09 ENCOUNTER — Encounter (HOSPITAL_COMMUNITY): Payer: Self-pay

## 2018-01-09 DIAGNOSIS — J0100 Acute maxillary sinusitis, unspecified: Secondary | ICD-10-CM

## 2018-01-09 MED ORDER — AMOXICILLIN-POT CLAVULANATE 875-125 MG PO TABS: 1.0000 | ORAL_TABLET | Freq: Two times a day (BID) | ORAL | 0 refills | Status: DC

## 2018-01-09 NOTE — ED Triage Notes (Signed)
3 weeks of sinus pressure and headache,

## 2018-01-10 ENCOUNTER — Other Ambulatory Visit: Payer: Self-pay

## 2018-01-10 ENCOUNTER — Ambulatory Visit (HOSPITAL_COMMUNITY)
Admission: EM | Admit: 2018-01-10 | Discharge: 2018-01-10 | Disposition: A | Discharge status: Home / Self Care | Payer: Self-pay | Attending: Family Medicine | Admitting: Family Medicine

## 2018-01-10 ENCOUNTER — Encounter (HOSPITAL_COMMUNITY): Payer: Self-pay

## 2018-01-10 DIAGNOSIS — J0100 Acute maxillary sinusitis, unspecified: Secondary | ICD-10-CM

## 2018-01-10 MED ORDER — IBUPROFEN 800 MG PO TABS: 800.0000 mg | ORAL_TABLET | Freq: Three times a day (TID) | ORAL | 0 refills | Status: DC

## 2018-01-10 MED ORDER — PSEUDOEPHEDRINE-GUAIFENESIN ER 60-600 MG PO TB12: 1.0000 | ORAL_TABLET | Freq: Two times a day (BID) | ORAL | 0 refills | Status: DC

## 2018-01-10 MED ORDER — TRIAMCINOLONE ACETONIDE 55 MCG/ACT NA AERO: 2.0000 | INHALATION_SPRAY | Freq: Every day | NASAL | 12 refills | Status: DC

## 2018-01-10 NOTE — Discharge Instructions (Addendum)
Need to increase fluid intake Use a humidifier if you have one I have prescribed prednisone and nasal spray.  Use twice a day until symptoms improve You may also use a saline (salt water) nasal spray.  This helps wash out the nose and sinuses Consider adding mucinex to loosen the mucous for easier drainage Take ibuprofen 3 times a day with food.  This is for pain.  Do not take with your indomethacin Get plenty of rest.  Off work tomorrow Expect improvement over the next 2 to 3 days

## 2018-01-10 NOTE — ED Provider Notes (Signed)
[place]    CSN: [id number] Arrival date & time: 01/10/18  1527     History   Chief Complaint Chief Complaint  Patient presents with  . Facial Pain    HPI Steven [name] is a 57 y.o. male.   HPI  Patient was seen  yesterday for acute sinus infection.  He was started on Augmentin.  He is here today because his face pain is increased.  He states that it hurts in his left cheek and above his left eye.  He states the pain is intense.  He has not taken any medicine for pain.  He is taking his Augmentin.  In addition he has heartburn.  He has been having this for about a week.  No nausea or vomiting.  Appetite is poor he is drinking fluids. Office note from yesterday is not available.  Past Medical History:  Diagnosis Date  . Chicken pox   . Diabetes mellitus without complication ([place])   . Hypertension     Patient Active Problem List   Diagnosis Date Noted  . Mass of right inguinal region s/p excision 12/19/2017 12/21/2017  . Incarcerated right inguinal hernia s/p repair 12/19/2017 12/18/2017  . Peripheral edema 12/10/2017  . Wellness examination 06/11/2017  . 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 Chest wall pain 07/02/2015  . Essential hypertension 10/14/2014  . Uncontrolled diabetes mellitus ([place]) 10/14/2014    Past Surgical History:  Procedure Laterality Date  . INGUINAL HERNIA REPAIR Right 12/19/2017   Procedure: HERNIA REPAIR INGUINAL RIGHT;  Surgeon: Coralie Keens, MD;  Location: [facility];  Service: General;  Laterality: Right;  . INSERTION OF MESH Right 12/19/2017   Procedure: INSERTION OF MESH;  Surgeon: Coralie Keens, MD;  Location: [facility];  Service: General;  Laterality: Right;  . MASS EXCISION Right 12/19/2017   Procedure: EXCISION OF RIGHT SCROTAL MASS;  Surgeon: Coralie Keens, MD;  Location: [facility];  Service: General;  Laterality: Right;  . TONSILLECTOMY         Home Medications    Prior to  Admission medications   Medication Sig Start Date End Date Taking? Authorizing Provider  acetaminophen (TYLENOL) 500 MG tablet Take 1 tablet (500 mg total) by mouth every 6 (six) hours as needed. 12/17/17   Zigmund Gottron, NP  allopurinol (ZYLOPRIM) 100 MG tablet Take 1 tablet (100 mg total) by mouth 2 (two) times daily. 12/10/17   [name], MD  amoxicillin-clavulanate (AUGMENTIN) 875-125 MG tablet Take 1 tablet by mouth every 12 (twelve) hours. 01/09/18   Vanessa Kick, MD  blood glucose meter kit and supplies KIT Dispense based on patient and insurance preference. Use up to four times daily as directed. (FOR ICD-9 250.00, 250.01). 07/08/15   Golden Circle, FNP  Blood Glucose Monitoring Suppl ([place]) w/Device KIT 1 kit by Does not apply route daily. Use as directed to check blood sugar daily Dx E11.9 10/08/15   Golden Circle, FNP  docusate sodium (COLACE) 100 MG capsule Take 1 capsule (100 mg total) by mouth daily. 12/21/17   Focht, Fraser Din, PA  glipiZIDE (GLUCOTROL XL) 10 MG 24 hr tablet Take 1 tablet (10 mg total) by mouth daily with breakfast. 06/11/17   [name], MD  glucose blood (ONETOUCH VERIO) test strip 1 each by Other route 4 (four) times daily -  with meals and at bedtime. Use to check blood sugars four times a day Dx  E11.9 10/08/15   Golden Circle, FNP  hydrochlorothiazide (HYDRODIURIL) 25 MG tablet 1/2 tab by mouth daily as needed for leg swelling 12/10/17   [name], MD  ibuprofen (ADVIL,MOTRIN) 800 MG tablet Take 1 tablet (800 mg total) by mouth 3 (three) times daily. 01/10/18   Raylene Everts, MD  indomethacin (INDOCIN) 25 MG capsule Take 1 capsule (25 mg total) by mouth 2 (two) times daily as needed. Patient taking differently: Take 25 mg by mouth 2 (two) times daily as needed for mild pain.  12/10/17   [name], MD  lisinopril (PRINIVIL,ZESTRIL) 20 MG tablet TAKE 1 TABLET BY MOUTH EVERY DAY 09/20/17   Lance Sell, NP    lovastatin (MEVACOR) 40 MG tablet Take 1 tablet (40 mg total) by mouth at bedtime. 12/10/17   [name], MD  metFORMIN (GLUCOPHAGE) 1000 MG tablet take 1 tablet by mouth twice a day with food 06/11/17   [name], MD  ondansetron (ZOFRAN-ODT) 4 MG disintegrating tablet Take 1 tablet (4 mg total) by mouth every 8 (eight) hours as needed for nausea or vomiting. 12/17/17   Zigmund Gottron, NP  [facility] DELICA LANCETS 58N MISC Use to help check blood sugars four times a day Dx e11.9 10/08/15   Golden Circle, FNP  oxyCODONE (OXY IR/ROXICODONE) 5 MG immediate release tablet Take 1 tablet (5 mg total) by mouth every 6 (six) hours as needed for moderate pain. 12/21/17   Focht, Fraser Din, PA  polyethylene glycol (MIRALAX / GLYCOLAX) packet Take 17 g by mouth daily. 12/21/17   Focht, Fraser Din, PA  pseudoephedrine-guaifenesin (MUCINEX D) 60-600 MG 12 hr tablet Take 1 tablet by mouth every 12 (twelve) hours. 01/10/18   Raylene Everts, MD  triamcinolone (NASACORT) 55 MCG/ACT AERO nasal inhaler Place 2 sprays into the nose daily. 01/10/18   Raylene Everts, MD    Family History Family History  Problem Relation Age of Onset  . Heart disease Mother   . Hypertension Mother   . Diabetes Mother   . Stroke Father   . Hypertension Father   . Stroke Brother     Social History Social History   Tobacco Use  . Smoking status: Former Smoker    Packs/day: 2.00    Years: 25.00    Pack years: 50.00    Types: Cigarettes  . Smokeless tobacco: Former Systems developer    Types: Chew  Substance Use Topics  . Alcohol use: Yes    Comment: occasionally  . Drug use: No     Allergies   Patient has no known allergies.   Review of Systems Review of Systems  Constitutional: Negative for chills and fever.  HENT: Positive for congestion, sinus pressure and sinus pain. Negative for ear pain and sore throat.   Eyes: Negative for pain and visual disturbance.  Respiratory: Negative for cough and shortness of  breath.   Cardiovascular: Negative for chest pain and palpitations.  Gastrointestinal: Negative for abdominal pain and vomiting.  Genitourinary: Negative for dysuria and hematuria.  Musculoskeletal: Negative for arthralgias and back pain.  Skin: Negative for color change and rash.  Neurological: Positive for headaches. Negative for seizures and syncope.  Psychiatric/Behavioral: Positive for decreased concentration and sleep disturbance.       Because of pain  All other systems reviewed and are negative.    Physical Exam Triage Vital Signs ED Triage Vitals  Enc Vitals Group     BP 01/10/18 1549 111/67  Pulse Rate 01/10/18 1549 65     Resp 01/10/18 1549 (!) 64     Temp 01/10/18 1549 98.1 F (36.7 C)     Temp src --      SpO2 01/10/18 1549 100 %     Weight 01/10/18 1550 225 lb (102.1 kg)     Height --      Head Circumference --      Peak Flow --      Pain Score 01/10/18 1550 10     Pain Loc --      Pain Edu? --      Excl. in [place]? --    No data found.  Updated Vital Signs BP 111/67   Pulse 65   Temp 98.1 F (36.7 C)   Resp (!) 64   Wt 102.1 kg   SpO2 100%   BMI 34.21 kg/m       Physical Exam  Constitutional: He appears well-developed and well-nourished. No distress.  HENT:  Head: Normocephalic and atraumatic.  Right Ear: External ear normal.  Left Ear: External ear normal.  Mouth/Throat: Oropharynx is clear and moist.  The left maxillary ethmoid and frontal sinuses are all very tender to touch.  No soft tissue swelling or erythema.  Mild posterior pharyngeal injection.  No exudate.  No adenopathy.  Eyes: Pupils are equal, round, and reactive to light. Conjunctivae are normal.  Neck: Normal range of motion.  Cardiovascular: Normal rate, regular rhythm and normal heart sounds.  Pulmonary/Chest: Effort normal and breath sounds normal. No respiratory distress.  Abdominal: Soft. He exhibits no distension.  Musculoskeletal: Normal range of motion. He exhibits no  edema.  Lymphadenopathy:    He has no cervical adenopathy.  Neurological: He is alert.  Skin: Skin is warm and dry.     UC Treatments / Results  Labs (all labs ordered are listed, but only abnormal results are displayed) Labs Reviewed - No data to display  EKG None  Radiology No results found.  Procedures Procedures (including critical care time)  Medications Ordered in UC Medications - No data to display  Initial Impression / Assessment and Plan / UC Course  I have reviewed the triage vital signs and the nursing notes.  Pertinent labs & imaging results that were available during my care of the patient were reviewed by me and considered in my medical decision making (see chart for details).     Discussed sinusitis.  In addition to the antibiotics he needs medication and methods to drain the purulence out of his sinuses to reduce the pressure and pain.  We discussed fluids, humidifier, Mucinex, nasal sprays, pain medication, and continued antibiotic use.  We discussed reasons for return. Final Clinical Impressions(s) / UC Diagnoses   Final diagnoses:  Acute non-recurrent maxillary sinusitis     Discharge Instructions     Need to increase fluid intake Use a humidifier if you have one I have prescribed prednisone and nasal spray.  Use twice a day until symptoms improve You may also use a saline (salt water) nasal spray.  This helps wash out the nose and sinuses Consider adding mucinex to loosen the mucous for easier drainage Take ibuprofen 3 times a day with food.  This is for pain.  Do not take with your indomethacin Get plenty of rest.  Off work tomorrow Expect improvement over the next 2 to 3 days   ED Prescriptions    Medication Sig Dispense Auth. Provider   triamcinolone (NASACORT) 55 MCG/ACT AERO  nasal inhaler Place 2 sprays into the nose daily. 1 Inhaler Raylene Everts, MD   ibuprofen (ADVIL,MOTRIN) 800 MG tablet Take 1 tablet (800 mg total) by mouth 3  (three) times daily. 21 tablet Raylene Everts, MD   pseudoephedrine-guaifenesin [facility] D) 60-600 MG 12 hr tablet Take 1 tablet by mouth every 12 (twelve) hours. 20 tablet Raylene Everts, MD     Controlled Substance Prescriptions [place] Controlled Substance Registry consulted? Not Applicable   Raylene Everts, MD 01/10/18 2105

## 2018-01-10 NOTE — ED Triage Notes (Signed)
3 weeks facial pain ( sinus pressure )

## 2018-01-14 NOTE — ED Provider Notes (Signed)
[name]   [id number] 01/09/18 Arrival Time: [place]:  1. Acute non-recurrent maxillary sinusitis     Meds ordered this encounter  Medications  . amoxicillin-clavulanate (AUGMENTIN) 875-125 MG tablet    Sig: Take 1 tablet by mouth every 12 (twelve) hours.    Dispense:  20 tablet    Refill:  0    Discussed typical duration of symptoms. OTC symptom care as needed. Ensure adequate fluid intake and rest. May f/u with PCP or here as needed.  Reviewed expectations re: course of current medical issues. Questions answered. Outlined signs and symptoms indicating need for more acute intervention. Patient verbalized understanding. After Visit Summary given.   SUBJECTIVE: History from: patient.  Steven [name] is a 57 y.o. male who presents with complaint of nasal congestion, post-nasal drainage, and sinus pain. Onset gradual, approximately 3 weeks ago. Respiratory symptoms: none Fever: no. Overall normal PO intake without n/v. OTC treatment: none reported. History of frequent sinus infections: no. Social History   Tobacco Use  Smoking Status Former Smoker  . Packs/day: 2.00  . Years: 25.00  . Pack years: 50.00  . Types: Cigarettes  Smokeless Tobacco Former Systems developer  . Types: Chew    ROS: As per HPI.   OBJECTIVE:  Vitals:   01/09/18 1909 01/09/18 1910  BP:  111/62  Pulse:  73  Resp:  18  Temp: 98.4 F (36.9 C)   SpO2:  100%  Weight:  102.1 kg     General appearance: alert; appears fatigued HEENT: nasal congestion; clear runny nose; throat irritation secondary to post-nasal drainage; bilateral maxillary tenderness to palpation; turbinates boggy Neck: supple without LAD Lungs: unlabored respirations, symmetrical air entry; cough: absent; no respiratory distress Skin: warm and dry Psychological: alert and cooperative; normal mood and affect  No Known Allergies  Past Medical History:  Diagnosis Date  . Chicken pox   . Diabetes  mellitus without complication ([place])   . Hypertension    Family History  Problem Relation Age of Onset  . Heart disease Mother   . Hypertension Mother   . Diabetes Mother   . Stroke Father   . Hypertension Father   . Stroke Brother    Social History   Socioeconomic History  . Marital status: Single    Spouse name: Not on file  . Number of children: 1  . Years of education: 53  . Highest education level: Not on file  Occupational History  . Occupation: Barista  . Financial resource strain: Not on file  . Food insecurity:    Worry: Not on file    Inability: Not on file  . Transportation needs:    Medical: Not on file    Non-medical: Not on file  Tobacco Use  . Smoking status: Former Smoker    Packs/day: 2.00    Years: 25.00    Pack years: 50.00    Types: Cigarettes  . Smokeless tobacco: Former Systems developer    Types: Chew  Substance and Sexual Activity  . Alcohol use: Yes    Comment: occasionally  . Drug use: No  . Sexual activity: Yes    Birth control/protection: Condom  Lifestyle  . Physical activity:    Days per week: Not on file    Minutes per session: Not on file  . Stress: Not on file  Relationships  . Social connections:    Talks on phone: Not on file    Gets together: Not on file  Attends religious service: Not on file    Active member of club or organization: Not on file    Attends meetings of clubs or organizations: Not on file    Relationship status: Not on file  . Intimate partner violence:    Fear of current or ex partner: Not on file    Emotionally abused: Not on file    Physically abused: Not on file    Forced sexual activity: Not on file  Other Topics Concern  . Not on file  Social History Narrative   Fun: Just working            Vanessa Kick, MD 01/14/18 1027

## 2018-01-15 ENCOUNTER — Other Ambulatory Visit: Payer: Self-pay | Admitting: Family Medicine

## 2018-01-15 ENCOUNTER — Ambulatory Visit (HOSPITAL_COMMUNITY)
Admission: EM | Admit: 2018-01-15 | Discharge: 2018-01-15 | Disposition: A | Discharge status: Home / Self Care | Payer: Self-pay | Attending: Family Medicine | Admitting: Family Medicine

## 2018-01-15 ENCOUNTER — Encounter (HOSPITAL_COMMUNITY): Payer: Self-pay | Admitting: Emergency Medicine

## 2018-01-15 DIAGNOSIS — J019 Acute sinusitis, unspecified: Secondary | ICD-10-CM

## 2018-01-15 DIAGNOSIS — Z760 Encounter for issue of repeat prescription: Secondary | ICD-10-CM

## 2018-01-15 DIAGNOSIS — G501 Atypical facial pain: Secondary | ICD-10-CM

## 2018-01-15 MED ORDER — AMOXICILLIN-POT CLAVULANATE 875-125 MG PO TABS: 1.0000 | ORAL_TABLET | Freq: Two times a day (BID) | ORAL | 0 refills | Status: DC

## 2018-01-15 MED ORDER — PSEUDOEPHEDRINE-GUAIFENESIN ER 60-600 MG PO TB12: 1.0000 | ORAL_TABLET | Freq: Two times a day (BID) | ORAL | 0 refills | Status: DC

## 2018-01-15 NOTE — ED Triage Notes (Signed)
PT is being treated for a sinus infection. PT ran out of prescribed mucinex and would like more. PT reports he is much improved.

## 2018-01-15 NOTE — ED Provider Notes (Signed)
[place]    CSN: [id number] Arrival date & time: 01/15/18  1844     History   Chief Complaint Chief Complaint  Patient presents with  . Facial Pain  . Medication Refill    HPI Steven [name] is a 57 y.o. male.   HPI  Patient was seen recently for maxillary sinusitis.  He is completing his Augmentin and his Mucinex.  His symptoms are much improved, however not completely gone.  He feels like he might need additional medicine to cleared up completely.  We discussed that he should stay on his Flonase.  He should continue the sinus wash with saline.  The Mucinex DM can be purchased over-the-counter.  His antibiotic will be need to be continued only if he has persistent bacterial infection/purulence in his sinuses.  He states when he leans over he still has some full sensation.  He is still having "phlegm" in the back of his throat.  Past Medical History:  Diagnosis Date  . Chicken pox   . Diabetes mellitus without complication ([place])   . Hypertension     Patient Active Problem List   Diagnosis Date Noted  . Mass of right inguinal region s/p excision 12/19/2017 12/21/2017  . Incarcerated right inguinal hernia s/p repair 12/19/2017 12/18/2017  . Peripheral edema 12/10/2017  . Wellness examination 06/11/2017  . 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 Chest wall pain 07/02/2015  . Essential hypertension 10/14/2014  . Uncontrolled diabetes mellitus ([place]) 10/14/2014    Past Surgical History:  Procedure Laterality Date  . INGUINAL HERNIA REPAIR Right 12/19/2017   Procedure: HERNIA REPAIR INGUINAL RIGHT;  Surgeon: Coralie Keens, MD;  Location: [facility];  Service: General;  Laterality: Right;  . INSERTION OF MESH Right 12/19/2017   Procedure: INSERTION OF MESH;  Surgeon: Coralie Keens, MD;  Location: [facility];  Service: General;  Laterality: Right;  . MASS EXCISION Right 12/19/2017   Procedure: EXCISION OF RIGHT SCROTAL MASS;   Surgeon: Coralie Keens, MD;  Location: [facility];  Service: General;  Laterality: Right;  . TONSILLECTOMY         Home Medications    Prior to Admission medications   Medication Sig Start Date End Date Taking? Authorizing Provider  acetaminophen (TYLENOL) 500 MG tablet Take 1 tablet (500 mg total) by mouth every 6 (six) hours as needed. 12/17/17   Zigmund Gottron, NP  allopurinol (ZYLOPRIM) 100 MG tablet Take 1 tablet (100 mg total) by mouth 2 (two) times daily. 12/10/17   [name], MD  amoxicillin-clavulanate (AUGMENTIN) 875-125 MG tablet Take 1 tablet by mouth every 12 (twelve) hours. 01/15/18   Raylene Everts, MD  blood glucose meter kit and supplies KIT Dispense based on patient and insurance preference. Use up to four times daily as directed. (FOR ICD-9 250.00, 250.01). 07/08/15   Golden Circle, FNP  Blood Glucose Monitoring Suppl ([place]) w/Device KIT 1 kit by Does not apply route daily. Use as directed to check blood sugar daily Dx E11.9 10/08/15   Golden Circle, FNP  docusate sodium (COLACE) 100 MG capsule Take 1 capsule (100 mg total) by mouth daily. 12/21/17   Focht, Fraser Din, PA  glipiZIDE (GLUCOTROL XL) 10 MG 24 hr tablet Take 1 tablet (10 mg total) by mouth daily with breakfast. 06/11/17   [name], MD  glucose blood [facility] VERIO) test strip 1 each by Other route 4 (four) times daily -  with meals and at bedtime. Use to check blood sugars four times a day Dx E11.9 10/08/15   Golden Circle, FNP  hydrochlorothiazide (HYDRODIURIL) 25 MG tablet 1/2 tab by mouth daily as needed for leg swelling 12/10/17   [name], MD  ibuprofen (ADVIL,MOTRIN) 800 MG tablet Take 1 tablet (800 mg total) by mouth 3 (three) times daily. 01/10/18   Raylene Everts, MD  indomethacin (INDOCIN) 25 MG capsule Take 1 capsule (25 mg total) by mouth 2 (two) times daily as needed. Patient taking differently: Take 25 mg by mouth 2 (two) times daily as needed for mild  pain.  12/10/17   [name], MD  lisinopril (PRINIVIL,ZESTRIL) 20 MG tablet TAKE 1 TABLET BY MOUTH EVERY DAY 09/20/17   Lance Sell, NP  lovastatin (MEVACOR) 40 MG tablet Take 1 tablet (40 mg total) by mouth at bedtime. 12/10/17   [name], MD  metFORMIN (GLUCOPHAGE) 1000 MG tablet take 1 tablet by mouth twice a day with food 06/11/17   [name], MD  ondansetron (ZOFRAN-ODT) 4 MG disintegrating tablet Take 1 tablet (4 mg total) by mouth every 8 (eight) hours as needed for nausea or vomiting. 12/17/17   Zigmund Gottron, NP  [facility] DELICA LANCETS 81J MISC Use to help check blood sugars four times a day Dx e11.9 10/08/15   Golden Circle, FNP  oxyCODONE (OXY IR/ROXICODONE) 5 MG immediate release tablet Take 1 tablet (5 mg total) by mouth every 6 (six) hours as needed for moderate pain. 12/21/17   Focht, Fraser Din, PA  polyethylene glycol (MIRALAX / GLYCOLAX) packet Take 17 g by mouth daily. 12/21/17   Focht, Fraser Din, PA  pseudoephedrine-guaifenesin (MUCINEX D) 60-600 MG 12 hr tablet Take 1 tablet by mouth every 12 (twelve) hours. 01/15/18   Raylene Everts, MD  triamcinolone (NASACORT) 55 MCG/ACT AERO nasal inhaler Place 2 sprays into the nose daily. 01/10/18   Raylene Everts, MD    Family History Family History  Problem Relation Age of Onset  . Heart disease Mother   . Hypertension Mother   . Diabetes Mother   . Stroke Father   . Hypertension Father   . Stroke Brother     Social History Social History   Tobacco Use  . Smoking status: Former Smoker    Packs/day: 2.00    Years: 25.00    Pack years: 50.00    Types: Cigarettes  . Smokeless tobacco: Former Systems developer    Types: Chew  Substance Use Topics  . Alcohol use: Yes    Comment: occasionally  . Drug use: No     Allergies   Patient has no known allergies.   Review of Systems Review of Systems  Constitutional: Negative for chills and fever.  HENT: Positive for postnasal drip and sinus pressure.  Negative for ear pain and sore throat.   Eyes: Negative for pain and visual disturbance.  Respiratory: Negative for cough and shortness of breath.   Cardiovascular: Negative for chest pain and palpitations.  Gastrointestinal: Negative for abdominal pain and vomiting.  Genitourinary: Negative for dysuria and hematuria.  Musculoskeletal: Negative for arthralgias and back pain.  Skin: Negative for color change and rash.  Neurological: Negative for seizures, syncope and headaches.  All other systems reviewed and are negative.    Physical Exam Triage Vital Signs ED Triage Vitals  Enc Vitals Group     BP 01/15/18 1931 110/74     Pulse Rate 01/15/18  1931 64     Resp 01/15/18 1931 16     Temp 01/15/18 1931 97.6 F (36.4 C)     Temp Source 01/15/18 1931 Oral     SpO2 01/15/18 1931 97 %     Weight --      Height --      Head Circumference --      Peak Flow --      Pain Score 01/15/18 1932 2     Pain Loc --      Pain Edu? --      Excl. in [place]? --    No data found.  Updated Vital Signs BP 110/74   Pulse 64   Temp 97.6 F (36.4 C) (Oral)   Resp 16   SpO2 97%   Visual Acuity Right Eye Distance:   Left Eye Distance:   Bilateral Distance:    Right Eye Near:   Left Eye Near:    Bilateral Near:     Physical Exam  Constitutional: He appears well-developed and well-nourished. No distress.  HENT:  Head: Normocephalic and atraumatic.  Right Ear: External ear normal.  Left Ear: External ear normal.  Mouth/Throat: Oropharynx is clear and moist.  Posterior pharynx is clear.  Sinuses are not tender.  Eyes: Pupils are equal, round, and reactive to light. Conjunctivae are normal.  Neck: Normal range of motion.  Cardiovascular: Normal rate, regular rhythm and normal heart sounds.  Pulmonary/Chest: Effort normal and breath sounds normal. No respiratory distress.  Abdominal: Soft. He exhibits no distension.  Musculoskeletal: Normal range of motion. He exhibits no edema.    Lymphadenopathy:    He has no cervical adenopathy.  Neurological: He is alert.  Skin: Skin is warm and dry.     UC Treatments / Results  Labs (all labs ordered are listed, but only abnormal results are displayed) Labs Reviewed - No data to display  EKG None  Radiology No results found.  Procedures Procedures (including critical care time)  Medications Ordered in UC Medications - No data to display  Initial Impression / Assessment and Plan / UC Course  I have reviewed the triage vital signs and the nursing notes.  Pertinent labs & imaging results that were available during my care of the patient were reviewed by me and considered in my medical decision making (see chart for details).     Discussed that sinusitis is sometimes difficult to treat.  He has persistent symptoms more likely from allergies.  If he does have recurrent sinusitis, he may need another course of antibiotics and Mucinex.  I did refill his medicine to use if at the end of the course of antibiotics he still has pressure purulent discharge.  He is reminded to follow-up with ENT for persistent symptoms Final Clinical Impressions(s) / UC Diagnoses   Final diagnoses:  Medication refill     Discharge Instructions     You may use the sinus rinse we discussed Continue your sinus infection treatment until you have no more pressure or pain in your sinuses (cheeks) I refilled your medications Return as needed   ED Prescriptions    Medication Sig Dispense Auth. Provider   amoxicillin-clavulanate (AUGMENTIN) 875-125 MG tablet Take 1 tablet by mouth every 12 (twelve) hours. 20 tablet Raylene Everts, MD   pseudoephedrine-guaifenesin [facility] D) 60-600 MG 12 hr tablet Take 1 tablet by mouth every 12 (twelve) hours. 20 tablet Raylene Everts, MD     Controlled Substance Prescriptions  Controlled Substance Registry  consulted? No   Raylene Everts, MD 01/15/18 2129

## 2018-01-15 NOTE — Discharge Instructions (Signed)
You may use the sinus rinse we discussed Continue your sinus infection treatment until you have no more pressure or pain in your sinuses (cheeks) I refilled your medications Return as needed

## 2018-01-16 ENCOUNTER — Other Ambulatory Visit: Payer: Self-pay | Admitting: Nurse Practitioner

## 2018-01-16 DIAGNOSIS — I10 Essential (primary) hypertension: Secondary | ICD-10-CM

## 2018-01-21 ENCOUNTER — Other Ambulatory Visit: Payer: Self-pay | Admitting: Internal Medicine

## 2018-01-21 DIAGNOSIS — M25571 Pain in right ankle and joints of right foot: Secondary | ICD-10-CM

## 2018-01-21 DIAGNOSIS — M25471 Effusion, right ankle: Secondary | ICD-10-CM

## 2018-01-21 DIAGNOSIS — M109 Gout, unspecified: Secondary | ICD-10-CM

## 2018-01-30 ENCOUNTER — Encounter (HOSPITAL_COMMUNITY): Payer: Self-pay | Admitting: Emergency Medicine

## 2018-01-30 ENCOUNTER — Other Ambulatory Visit: Payer: Self-pay

## 2018-01-30 ENCOUNTER — Ambulatory Visit (HOSPITAL_COMMUNITY)
Admission: EM | Admit: 2018-01-30 | Discharge: 2018-01-30 | Disposition: A | Discharge status: Home / Self Care | Payer: Self-pay | Attending: Family Medicine | Admitting: Family Medicine

## 2018-01-30 DIAGNOSIS — Z87891 Personal history of nicotine dependence: Secondary | ICD-10-CM | POA: Insufficient documentation

## 2018-01-30 DIAGNOSIS — Z7984 Long term (current) use of oral hypoglycemic drugs: Secondary | ICD-10-CM | POA: Insufficient documentation

## 2018-01-30 DIAGNOSIS — Z79899 Other long term (current) drug therapy: Secondary | ICD-10-CM | POA: Insufficient documentation

## 2018-01-30 DIAGNOSIS — I10 Essential (primary) hypertension: Secondary | ICD-10-CM | POA: Insufficient documentation

## 2018-01-30 DIAGNOSIS — R1084 Generalized abdominal pain: Secondary | ICD-10-CM | POA: Insufficient documentation

## 2018-01-30 DIAGNOSIS — E1165 Type 2 diabetes mellitus with hyperglycemia: Secondary | ICD-10-CM | POA: Insufficient documentation

## 2018-01-30 DIAGNOSIS — K4030 Unilateral inguinal hernia, with obstruction, without gangrene, not specified as recurrent: Secondary | ICD-10-CM | POA: Insufficient documentation

## 2018-01-30 LAB — CBC
HCT: 33 % — ABNORMAL LOW (ref 39.0–52.0)
Hemoglobin: 10.2 g/dL — ABNORMAL LOW (ref 13.0–17.0)
MCH: 24.3 pg — ABNORMAL LOW (ref 26.0–34.0)
MCHC: 30.9 g/dL (ref 30.0–36.0)
MCV: 78.8 fL (ref 78.0–100.0)
Platelets: 379 10*3/uL (ref 150–400)
RBC: 4.19 MIL/uL — ABNORMAL LOW (ref 4.22–5.81)
RDW: 15.4 % (ref 11.5–15.5)
WBC: 7.2 10*3/uL (ref 4.0–10.5)

## 2018-01-30 LAB — COMPREHENSIVE METABOLIC PANEL
ALT: 30 U/L (ref 0–44)
AST: 15 U/L (ref 15–41)
Albumin: 3.4 g/dL — ABNORMAL LOW (ref 3.5–5.0)
Alkaline Phosphatase: 68 U/L (ref 38–126)
Anion gap: 10 (ref 5–15)
BILIRUBIN TOTAL: 0.5 mg/dL (ref 0.3–1.2)
BUN: 20 mg/dL (ref 6–20)
CO2: 24 mmol/L (ref 22–32)
CREATININE: 1.15 mg/dL (ref 0.61–1.24)
Calcium: 9.5 mg/dL (ref 8.9–10.3)
Chloride: 104 mmol/L (ref 98–111)
GFR calc Af Amer: >60 mL/min (ref >60)
GLUCOSE: 111 mg/dL — AB (ref 70–99)
Potassium: 3.8 mmol/L (ref 3.5–5.1)
Sodium: 138 mmol/L (ref 135–145)
TOTAL PROTEIN: 6.9 g/dL (ref 6.5–8.1)

## 2018-01-30 LAB — HEMOGLOBIN A1C
Hgb A1c MFr Bld: 8.7 % — ABNORMAL HIGH (ref 4.8–5.6)
Mean Plasma Glucose: 202.99 mg/dL

## 2018-01-30 LAB — LIPASE, BLOOD: Lipase: 35 U/L (ref 11–51)

## 2018-01-30 MED ORDER — METOCLOPRAMIDE HCL 5 MG PO TABS: 5.0000 mg | ORAL_TABLET | Freq: Three times a day (TID) | ORAL | 0 refills | Status: DC

## 2018-01-30 NOTE — Discharge Instructions (Signed)
Eat a well balanced diet - low in carbohydrates and sugars Drink plenty of water I have done labs and will call you on Friday Take the reglan before eating This is for diabetic stomach problems You should call your PCP to be seen next week

## 2018-01-30 NOTE — ED Triage Notes (Signed)
Onset one week ago of feeling tired, pressure behind eyes, poor appetite.  Denies vomiting, denies diarrhea

## 2018-01-30 NOTE — ED Provider Notes (Signed)
[place]    CSN: [id number] Arrival date & time: 01/30/18  1938     History   Chief Complaint Chief Complaint  Patient presents with  . Abdominal Pain    HPI Steven [name] is a 57 y.o. male.   HPI  This is a third time where seen this patient this month.  He has been treated for an upper respiratory and sinus infection.  He still complains of pressure behind his eyes.  He completed 2 courses of antibiotics.  His other complaint is that he has a "burning" in his stomach.  Decreased appetite.  He is drinking plenty of water.  He is eating, but he gets full easily.  He feels tired.  He is a poorly controlled diabetic who does not watch his diet, he does not take his sugars.  His last hemoglobin A1c in July was almost 10.  He does not think his sugars been high.  He has been taking his medications as prescribed.  He states he has not been feeling as well since I stopped his indomethacin.  I stop the indomethacin temporarily while he took ibuprofen for pain.  I told him he can go back on his indomethacin at any time.  I do not think this is what is causing his stomach to burn, however,.  He states he used to have GERD but does not have this anymore.  He is not having heartburn.  He is never had ulcers.  He is not taking any GI medications.  Change in his bowels.  No blood in his bowels.  No diarrhea.  I worry with 2 courses of Augmentin.  He states he tolerated this without difficulty.   Past Medical History:  Diagnosis Date  . Chicken pox   . Diabetes mellitus without complication ([place])   . Hypertension     Patient Active Problem List   Diagnosis Date Noted  . Mass of right inguinal region s/p excision 12/19/2017 12/21/2017  . Incarcerated right inguinal hernia s/p repair 12/19/2017 12/18/2017  . Peripheral edema 12/10/2017  . Wellness examination 06/11/2017  . 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 Chest wall pain 07/02/2015  .  Essential hypertension 10/14/2014  . Uncontrolled diabetes mellitus ([place]) 10/14/2014    Past Surgical History:  Procedure Laterality Date  . INGUINAL HERNIA REPAIR Right 12/19/2017   Procedure: HERNIA REPAIR INGUINAL RIGHT;  Surgeon: Coralie Keens, MD;  Location: [facility];  Service: General;  Laterality: Right;  . INSERTION OF MESH Right 12/19/2017   Procedure: INSERTION OF MESH;  Surgeon: Coralie Keens, MD;  Location: [facility];  Service: General;  Laterality: Right;  . MASS EXCISION Right 12/19/2017   Procedure: EXCISION OF RIGHT SCROTAL MASS;  Surgeon: Coralie Keens, MD;  Location: [facility];  Service: General;  Laterality: Right;  . TONSILLECTOMY         Home Medications    Prior to Admission medications   Medication Sig Start Date End Date Taking? Authorizing Provider  acetaminophen (TYLENOL) 500 MG tablet Take 1 tablet (500 mg total) by mouth every 6 (six) hours as needed. 12/17/17   Zigmund Gottron, NP  allopurinol (ZYLOPRIM) 100 MG tablet Take 1 tablet (100 mg total) by mouth 2 (two) times daily. 12/10/17   [name], MD  amoxicillin-clavulanate (AUGMENTIN) 875-125 MG tablet Take 1 tablet by mouth every 12 (twelve) hours. 01/15/18   Raylene Everts, MD  blood glucose meter kit and supplies KIT  Dispense based on patient and insurance preference. Use up to four times daily as directed. (FOR ICD-9 250.00, 250.01). 07/08/15   Golden Circle, FNP  Blood Glucose Monitoring Suppl ([place]) w/Device KIT 1 kit by Does not apply route daily. Use as directed to check blood sugar daily Dx E11.9 10/08/15   Golden Circle, FNP  docusate sodium (COLACE) 100 MG capsule Take 1 capsule (100 mg total) by mouth daily. 12/21/17   Focht, Fraser Din, PA  glipiZIDE (GLUCOTROL XL) 10 MG 24 hr tablet Take 1 tablet (10 mg total) by mouth daily with breakfast. 06/11/17   [name], MD  glucose blood (ONETOUCH VERIO) test strip 1 each by Other route 4 (four) times daily -  with  meals and at bedtime. Use to check blood sugars four times a day Dx E11.9 10/08/15   Golden Circle, FNP  hydrochlorothiazide (HYDRODIURIL) 25 MG tablet 1/2 tab by mouth daily as needed for leg swelling 12/10/17   [name], MD  ibuprofen (ADVIL,MOTRIN) 800 MG tablet Take 1 tablet (800 mg total) by mouth 3 (three) times daily. 01/10/18   Raylene Everts, MD  indomethacin (INDOCIN) 25 MG capsule TAKE 1 CAPSULE (25 MG TOTAL) BY MOUTH 2 (TWO) TIMES DAILY AS NEEDED. 01/22/18   [name], MD  lisinopril (PRINIVIL,ZESTRIL) 20 MG tablet TAKE 1 TABLET BY MOUTH EVERY DAY 01/16/18   [name], MD  lovastatin (MEVACOR) 40 MG tablet Take 1 tablet (40 mg total) by mouth at bedtime. 12/10/17   [name], MD  metFORMIN (GLUCOPHAGE) 1000 MG tablet take 1 tablet by mouth twice a day with food 06/11/17   [name], MD  metoCLOPramide (REGLAN) 5 MG tablet Take 1 tablet (5 mg total) by mouth 4 (four) times daily -  before meals and at bedtime. 01/30/18   Raylene Everts, MD  ondansetron (ZOFRAN-ODT) 4 MG disintegrating tablet Take 1 tablet (4 mg total) by mouth every 8 (eight) hours as needed for nausea or vomiting. 12/17/17   Zigmund Gottron, NP  [facility] DELICA LANCETS 59D MISC Use to help check blood sugars four times a day Dx e11.9 10/08/15   Golden Circle, FNP  oxyCODONE (OXY IR/ROXICODONE) 5 MG immediate release tablet Take 1 tablet (5 mg total) by mouth every 6 (six) hours as needed for moderate pain. 12/21/17   Focht, Fraser Din, PA  polyethylene glycol (MIRALAX / GLYCOLAX) packet Take 17 g by mouth daily. 12/21/17   Focht, Fraser Din, PA  pseudoephedrine-guaifenesin (MUCINEX D) 60-600 MG 12 hr tablet Take 1 tablet by mouth every 12 (twelve) hours. 01/15/18   Raylene Everts, MD  triamcinolone (NASACORT) 55 MCG/ACT AERO nasal inhaler Place 2 sprays into the nose daily. 01/10/18   Raylene Everts, MD    Family History Family History  Problem Relation Age of Onset  . Heart disease  Mother   . Hypertension Mother   . Diabetes Mother   . Stroke Father   . Hypertension Father   . Stroke Brother     Social History Social History   Tobacco Use  . Smoking status: Former Smoker    Packs/day: 2.00    Years: 25.00    Pack years: 50.00    Types: Cigarettes  . Smokeless tobacco: Former Systems developer    Types: Chew  Substance Use Topics  . Alcohol use: Yes    Comment: occasionally  . Drug use: No  Allergies   Patient has no known allergies.   Review of Systems Review of Systems  Constitutional: Positive for appetite change and fatigue. Negative for chills and fever.  HENT: Positive for sinus pressure. Negative for ear pain and sore throat.   Eyes: Negative for pain and visual disturbance.  Respiratory: Negative for cough and shortness of breath.   Cardiovascular: Negative for chest pain and palpitations.  Gastrointestinal: Negative for abdominal distention, abdominal pain, blood in stool, constipation, diarrhea, nausea and vomiting.       Burning in stomach  Genitourinary: Negative for dysuria, frequency and hematuria.       Denies urinary frequency  Musculoskeletal: Positive for arthralgias and gait problem. Negative for back pain.       Right knee pain  Skin: Negative for color change and rash.  Neurological: Negative for seizures, syncope and headaches.  Psychiatric/Behavioral: Negative for dysphoric mood. The patient is not nervous/anxious.   All other systems reviewed and are negative.    Physical Exam Triage Vital Signs ED Triage Vitals  Enc Vitals Group     BP 01/30/18 2008 118/70     Pulse Rate 01/30/18 2008 77     Resp 01/30/18 2008 18     Temp 01/30/18 2008 98.4 F (36.9 C)     Temp Source 01/30/18 2008 Oral     SpO2 01/30/18 2008 100 %     Weight --      Height --      Head Circumference --      Peak Flow --      Pain Score 01/30/18 2007 0     Pain Loc --      Pain Edu? --      Excl. in [place]? --    No data found.  Updated Vital  Signs BP 118/70 (BP Location: Right Arm)   Pulse 77   Temp 98.4 F (36.9 C) (Oral)   Resp 18   SpO2 100%       Physical Exam  Constitutional: He appears well-developed and well-nourished. No distress.  Appears tired.  Small steps, slow gait.  HENT:  Head: Normocephalic and atraumatic.  Mouth/Throat: Oropharynx is clear and moist. No oropharyngeal exudate.  No sinus tenderness  Eyes: Pupils are equal, round, and reactive to light. Conjunctivae and EOM are normal.  Neck: Normal range of motion.  Cardiovascular: Normal rate, regular rhythm and normal heart sounds.  Pulmonary/Chest: Effort normal and breath sounds normal. No respiratory distress. He has no rales.  Abdominal: Soft. Normal appearance. He exhibits no distension and no abdominal bruit. Bowel sounds are increased. There is no hepatosplenomegaly. There is no tenderness.  Musculoskeletal: Normal range of motion. He exhibits no edema.  Neurological: He is alert.  Skin: Skin is warm and dry.  Psychiatric: He has a normal mood and affect. His behavior is normal.  Vitals reviewed.    UC Treatments / Results  Labs (all labs ordered are listed, but only abnormal results are displayed) Labs Reviewed  COMPREHENSIVE METABOLIC PANEL  CBC  HEMOGLOBIN A1C  LIPASE, BLOOD    EKG None  Radiology No results found.  Procedures Procedures (including critical care time)  Medications Ordered in UC Medications - No data to display  Initial Impression / Assessment and Plan / UC Course  I have reviewed the triage vital signs and the nursing notes.  Pertinent labs & imaging results that were available during my care of the patient were reviewed by me and considered in my medical  decision making (see chart for details).     I am uncertain why the patient has fatigue and abdominal burning.  I do not think it is related to a sinus infection.  I do worry about his uncontrolled diabetes.  He agrees to have lab work done today, I  will send a copy of it to his primary care doctor.  He needs to see his primary care doctor next week.  Because he has early satiety and stomach burning, decreased appetite I will give him a short course of Reglan.  This is to see if it will help him.  We discussed diabetic gastroparesis. Final Clinical Impressions(s) / UC Diagnoses   Final diagnoses:  Generalized abdominal pain     Discharge Instructions     Eat a well balanced diet - low in carbohydrates and sugars Drink plenty of water I have done labs and will call you on Friday Take the reglan before eating This is for diabetic stomach problems You should call your PCP to be seen next week   ED Prescriptions    Medication Sig Dispense Auth. Provider   metoCLOPramide (REGLAN) 5 MG tablet Take 1 tablet (5 mg total) by mouth 4 (four) times daily -  before meals and at bedtime. 40 tablet Raylene Everts, MD     Controlled Substance Prescriptions [place] Controlled Substance Registry consulted? Not Applicable   Raylene Everts, MD 01/30/18 2046

## 2018-02-01 ENCOUNTER — Encounter (HOSPITAL_COMMUNITY): Payer: Self-pay | Admitting: Family Medicine

## 2018-03-14 NOTE — Progress Notes (Signed)
Sender Rueb - 57 y.o. male MRN [id number]  Date of birth: Feb 04, 1961  SUBJECTIVE:  Including CC & ROS.  No chief complaint on file.   Steven [name] is a 57 y.o. male that is presenting with left knee pain and a ganglion cyst on the left dorsal wrist.  Left knee pain is been present for 2 to 3 months.  The pain is localized to the medial anterior aspect of the knee.  Denies any mechanical symptoms.  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  He denies any trauma to the knee.  Denies any medication use.  Feels like the pain is staying the same.  The pain is moderate in severity.  Denies any swelling  Has a ganglion cyst on his left dorsal wrist.  Has been there for a few weeks.  Denies any significant pain.  Does not endorse getting larger.  He is left-handed.  Has not done anything for it.  He also has a complaint in his right axilla.  He has a cyst in there that drains from time to time.  It varies in size.  The drainage does smell.  He denies any trauma to the area.  Denies any redness or fevers.  Has never had anything drained from it previously.  He feels like it does not ever completely go away.  He has been dealing with this for some time.  It is localized to this area.  Independent review of the left knee x-ray from 7/23 shows no significant narrowing of the medial lateral joint line.   Review of Systems  Constitutional: Negative for fever.  HENT: Negative for congestion.   Respiratory: Negative for cough.   Cardiovascular: Negative for chest pain.  Gastrointestinal: Negative for abdominal pain.  Musculoskeletal: Negative for gait problem.  Skin: Negative for color change.  Hematological: Negative for adenopathy.  Psychiatric/Behavioral: Negative for agitation.    HISTORY: Past Medical, Surgical, Social, and Family History Reviewed & Updated per EMR.   Pertinent Historical Findings include:  Past Medical History:  Diagnosis Date  . Chicken pox   . Diabetes mellitus without  complication ([place])   . Hypertension     Past Surgical History:  Procedure Laterality Date  . INGUINAL HERNIA REPAIR Right 12/19/2017   Procedure: HERNIA REPAIR INGUINAL RIGHT;  Surgeon: Coralie Keens, MD;  Location: [facility];  Service: General;  Laterality: Right;  . INSERTION OF MESH Right 12/19/2017   Procedure: INSERTION OF MESH;  Surgeon: Coralie Keens, MD;  Location: [facility];  Service: General;  Laterality: Right;  . MASS EXCISION Right 12/19/2017   Procedure: EXCISION OF RIGHT SCROTAL MASS;  Surgeon: Coralie Keens, MD;  Location: [facility];  Service: General;  Laterality: Right;  . TONSILLECTOMY      No Known Allergies  Family History  Problem Relation Age of Onset  . Heart disease Mother   . Hypertension Mother   . Diabetes Mother   . Stroke Father   . Hypertension Father   . Stroke Brother      Social History   Socioeconomic History  . Marital status: Single    Spouse name: Not on file  . Number of children: 1  . Years of education: 33  . Highest education level: Not on file  Occupational History  . Occupation: Barista  . Financial resource strain: Not on file  . Food insecurity:    Worry: Not on file    Inability: Not on file  . Transportation needs:  Medical: Not on file    Non-medical: Not on file  Tobacco Use  . Smoking status: Former Smoker    Packs/day: 2.00    Years: 25.00    Pack years: 50.00    Types: Cigarettes  . Smokeless tobacco: Former Systems developer    Types: Chew  Substance and Sexual Activity  . Alcohol use: Yes    Comment: occasionally  . Drug use: No  . Sexual activity: Yes    Birth control/protection: Condom  Lifestyle  . Physical activity:    Days per week: Not on file    Minutes per session: Not on file  . Stress: Not on file  Relationships  . Social connections:    Talks on phone: Not on file    Gets together: Not on file    Attends religious service: Not on file    Active member of club or  organization: Not on file    Attends meetings of clubs or organizations: Not on file    Relationship status: Not on file  . Intimate partner violence:    Fear of current or ex partner: Not on file    Emotionally abused: Not on file    Physically abused: Not on file    Forced sexual activity: Not on file  Other Topics Concern  . Not on file  Social History Narrative   Fun: Just working     PHYSICAL EXAM:  VS: BP 122/74   Pulse 62   Resp 16   Wt 216 lb (98 kg)   SpO2 99%   BMI 32.84 kg/m  Physical Exam Gen: NAD, alert, cooperative with exam, well-appearing ENT: normal lips, normal nasal mucosa,  Eye: normal EOM, normal conjunctiva and lids CV:  no edema, +2 pedal pulses   Resp: no accessory muscle use, non-labored,  Skin: no rashes, no areas of induration  Neuro: normal tone, normal sensation to touch Psych:  normal insight, alert and oriented MSK:  Left knee: No obvious effusion. No tenderness to palpation of the quad or patellar tendon. No tenderness palpation of the medial lateral joint line. Normal range of motion. Normal strength resistance. No instability. Negative McMurray's test. Left wrist: Soft mobile cyst on the dorsal aspect. Normal range of motion. No tenderness to palpation Right axilla: No active draining appreciated. No overlying redness. Firm area upon palpation no fluctuance. Neurovascular intact   Aspiration/Injection Procedure Note Steven [name] 04/04/1961  Procedure: Aspiration Indications: left ganglion cyst   Procedure Details Consent: Risks of procedure as well as the alternatives and risks of each were explained to the (patient/caregiver).  Consent for procedure obtained. Time Out: Verified patient identification, verified procedure, site/side was marked, verified correct patient position, special equipment/implants available, medications/allergies/relevent history reviewed, required imaging and test results available.  Performed.   The area was cleaned with iodine and alcohol swabs.    The left wrist dorsal ganglion cyst was aspirated with an 18-gauge inch and half needle.  A gelatinous clear fluid was drained.  Amount of Fluid Aspirated: minimal amount Character of Fluid: clear and gelatinous Fluid was sent for:n/a A sterile dressing was applied.  Patient did tolerate procedure well.     ASSESSMENT & PLAN:   Sebaceous cyst Appears to be a cyst in his right axilla. No signs of infection.  - counseled on supportive car - will monitor for now  - if no improvement can drain or send to surgery for removal.   Acute pain of left knee Does not have a significant  amount of degenerative changes on x-ray.  Could be more patellofemoral in nature.  Less likely for degenerative meniscal problem. -Pennsaid provided. -Counseled on home exercise therapy. -Counseled on supportive care. -If no improvement can consider physical therapy or injection.  Ganglion cyst Cyst appearing on the dorsal aspect of his wrist. -Aspiration today. -Can consider aspiration again or surgery if no improvement.

## 2018-03-15 ENCOUNTER — Ambulatory Visit (INDEPENDENT_AMBULATORY_CARE_PROVIDER_SITE_OTHER): Payer: Self-pay | Admitting: Family Medicine

## 2018-03-15 VITALS — BP 122/74 | HR 62 | Resp 16 | Wt 216.0 lb

## 2018-03-15 DIAGNOSIS — M25562 Pain in left knee: Secondary | ICD-10-CM | POA: Insufficient documentation

## 2018-03-15 DIAGNOSIS — Z23 Encounter for immunization: Secondary | ICD-10-CM

## 2018-03-15 DIAGNOSIS — M6740 Ganglion, unspecified site: Secondary | ICD-10-CM | POA: Insufficient documentation

## 2018-03-15 DIAGNOSIS — L723 Sebaceous cyst: Secondary | ICD-10-CM

## 2018-03-15 MED ORDER — DICLOFENAC SODIUM 2 % TD SOLN: 1.0000 "application " | Freq: Two times a day (BID) | TRANSDERMAL | 3 refills | Status: DC

## 2018-03-15 NOTE — Assessment & Plan Note (Signed)
Appears to be a cyst in his right axilla. No signs of infection.  - counseled on supportive car - will monitor for now  - if no improvement can drain or send to surgery for removal.

## 2018-03-15 NOTE — Assessment & Plan Note (Signed)
Cyst appearing on the dorsal aspect of his wrist. -Aspiration today. -Can consider aspiration again or surgery if no improvement.

## 2018-03-15 NOTE — Assessment & Plan Note (Signed)
Does not have a significant amount of degenerative changes on x-ray.  Could be more patellofemoral in nature.  Less likely for degenerative meniscal problem. -Pennsaid provided. -Counseled on home exercise therapy. -Counseled on supportive care. -If no improvement can consider physical therapy or injection.

## 2018-03-15 NOTE — Patient Instructions (Addendum)
Nice to meet you  Please try the pennsaid on your knee  Please try ice and compression while working.  Please try the exercises  Please follow up with me if your symptoms don't improve after 3-4 weeks.

## 2018-03-24 ENCOUNTER — Other Ambulatory Visit: Payer: Self-pay | Admitting: Internal Medicine

## 2018-03-24 DIAGNOSIS — M25471 Effusion, right ankle: Secondary | ICD-10-CM

## 2018-03-24 DIAGNOSIS — M109 Gout, unspecified: Secondary | ICD-10-CM

## 2018-03-24 DIAGNOSIS — M25571 Pain in right ankle and joints of right foot: Secondary | ICD-10-CM

## 2018-04-01 ENCOUNTER — Other Ambulatory Visit (INDEPENDENT_AMBULATORY_CARE_PROVIDER_SITE_OTHER): Payer: Self-pay

## 2018-04-01 ENCOUNTER — Encounter: Payer: Self-pay | Admitting: Internal Medicine

## 2018-04-01 ENCOUNTER — Ambulatory Visit (INDEPENDENT_AMBULATORY_CARE_PROVIDER_SITE_OTHER): Payer: Self-pay | Admitting: Internal Medicine

## 2018-04-01 VITALS — BP 142/90 | HR 67 | Temp 97.9°F | Ht 68.0 in | Wt 215.0 lb

## 2018-04-01 DIAGNOSIS — E611 Iron deficiency: Secondary | ICD-10-CM

## 2018-04-01 DIAGNOSIS — I10 Essential (primary) hypertension: Secondary | ICD-10-CM

## 2018-04-01 DIAGNOSIS — M542 Cervicalgia: Secondary | ICD-10-CM | POA: Insufficient documentation

## 2018-04-01 DIAGNOSIS — M549 Dorsalgia, unspecified: Secondary | ICD-10-CM

## 2018-04-01 DIAGNOSIS — N320 Bladder-neck obstruction: Secondary | ICD-10-CM

## 2018-04-01 DIAGNOSIS — E538 Deficiency of other specified B group vitamins: Secondary | ICD-10-CM

## 2018-04-01 DIAGNOSIS — D649 Anemia, unspecified: Secondary | ICD-10-CM | POA: Insufficient documentation

## 2018-04-01 DIAGNOSIS — E1165 Type 2 diabetes mellitus with hyperglycemia: Secondary | ICD-10-CM

## 2018-04-01 LAB — CBC WITH DIFFERENTIAL/PLATELET
Basophils Absolute: 0 10*3/uL (ref 0.0–0.1)
Basophils Relative: 0.6 % (ref 0.0–3.0)
EOS PCT: 2.3 % (ref 0.0–5.0)
Eosinophils Absolute: 0.2 10*3/uL (ref 0.0–0.7)
HCT: 36.3 % — ABNORMAL LOW (ref 39.0–52.0)
HEMOGLOBIN: 11.7 g/dL — AB (ref 13.0–17.0)
Lymphocytes Relative: 34.7 % (ref 12.0–46.0)
Lymphs Abs: 2.4 10*3/uL (ref 0.7–4.0)
MCHC: 32.2 g/dL (ref 30.0–36.0)
MCV: 77.9 fl — ABNORMAL LOW (ref 78.0–100.0)
MONOS PCT: 7.7 % (ref 3.0–12.0)
Monocytes Absolute: 0.5 10*3/uL (ref 0.1–1.0)
Neutro Abs: 3.8 10*3/uL (ref 1.4–7.7)
Neutrophils Relative %: 54.7 % (ref 43.0–77.0)
Platelets: 333 10*3/uL (ref 150.0–400.0)
RBC: 4.65 Mil/uL (ref 4.22–5.81)
RDW: 17.6 % — ABNORMAL HIGH (ref 11.5–15.5)
WBC: 7 10*3/uL (ref 4.0–10.5)

## 2018-04-01 LAB — IBC PANEL
Iron: 47 ug/dL (ref 42–165)
Saturation Ratios: 10.5 % — ABNORMAL LOW (ref 20.0–50.0)
Transferrin: 321 mg/dL (ref 212.0–360.0)

## 2018-04-01 LAB — VITAMIN B12: VITAMIN B 12: 360 pg/mL (ref 211–911)

## 2018-04-01 LAB — PSA: PSA: 0.81 ng/mL (ref 0.10–4.00)

## 2018-04-01 MED ORDER — KETOROLAC TROMETHAMINE 30 MG/ML IJ SOLN
30.0000 mg | Freq: Once | INTRAMUSCULAR | Status: AC
  Administered 2018-04-01: 30 mg via INTRAMUSCULAR

## 2018-04-01 MED ORDER — CYCLOBENZAPRINE HCL 5 MG PO TABS: 5.0000 mg | ORAL_TABLET | Freq: Three times a day (TID) | ORAL | 1 refills | Status: DC | PRN

## 2018-04-01 MED ORDER — TRAMADOL HCL 50 MG PO TABS: 50.0000 mg | ORAL_TABLET | Freq: Four times a day (QID) | ORAL | 0 refills | Status: DC | PRN

## 2018-04-01 NOTE — Assessment & Plan Note (Signed)
C/w right trapezoid strain - for tramadol prn, flexeril prn,  to f/u any worsening symptoms or concerns

## 2018-04-01 NOTE — Progress Notes (Signed)
Subjective:    Patient ID: Steven [name], male    DOB: 1960/11/05, 57 y.o.   MRN: [id number]  HPI    Here to f/u; overall doing ok,  Pt denies chest pain, increasing sob or doe, wheezing, orthopnea, PND, increased LE swelling, palpitations, dizziness or syncope.  Pt denies new neurological symptoms such as new headache, or facial or extremity weakness or numbness.  Pt denies polydipsia, polyuria, or low sugar episode.  Pt states overall good compliance with meds, mostly trying to follow appropriate diet, with wt overall stable,  but little exercise however.  No overt bleeding.  Denies urinary symptoms such as dysuria, frequency, urgency, flank pain, hematuria or n/v, fever, chills.  Also has mod to severe recurrent right upper back pain without shoulder pain or worsening, and no RUE extremity radicular symptoms, better with OTC topical pain patch, heat no help, 5 days, intermittent, started when he woke up one day Past Medical History:  Diagnosis Date  . Chicken pox   . Diabetes mellitus without complication ([place])   . Hypertension    Past Surgical History:  Procedure Laterality Date  . INGUINAL HERNIA REPAIR Right 12/19/2017   Procedure: HERNIA REPAIR INGUINAL RIGHT;  Surgeon: Coralie Keens, MD;  Location: [facility];  Service: General;  Laterality: Right;  . INSERTION OF MESH Right 12/19/2017   Procedure: INSERTION OF MESH;  Surgeon: Coralie Keens, MD;  Location: [facility];  Service: General;  Laterality: Right;  . MASS EXCISION Right 12/19/2017   Procedure: EXCISION OF RIGHT SCROTAL MASS;  Surgeon: Coralie Keens, MD;  Location: [facility];  Service: General;  Laterality: Right;  . TONSILLECTOMY      reports that he has quit smoking. His smoking use included cigarettes. He has a 50.00 pack-year smoking history. He has quit using smokeless tobacco.  His smokeless tobacco use included chew. He reports that he drinks alcohol. He reports that he does not use drugs. family history includes Diabetes  in his mother; Heart disease in his mother; Hypertension in his father and mother; Stroke in his brother and father. No Known Allergies Current Outpatient Medications on File Prior to Visit  Medication Sig Dispense Refill  . acetaminophen (TYLENOL) 500 MG tablet Take 1 tablet (500 mg total) by mouth every 6 (six) hours as needed. 30 tablet 0  . allopurinol (ZYLOPRIM) 100 MG tablet Take 1 tablet (100 mg total) by mouth 2 (two) times daily. 180 tablet 3  . blood glucose meter kit and supplies KIT Dispense based on patient and insurance preference. Use up to four times daily as directed. (FOR ICD-9 250.00, 250.01). 1 each 0  . Blood Glucose Monitoring Suppl ([place]) w/Device KIT 1 kit by Does not apply route daily. Use as directed to check blood sugar daily Dx E11.9 1 kit 0  . Diclofenac Sodium (PENNSAID) 2 % SOLN Place 1 application onto the skin 2 (two) times daily. 1 Bottle 3  . docusate sodium (COLACE) 100 MG capsule Take 1 capsule (100 mg total) by mouth daily. 10 capsule 0  . glipiZIDE (GLUCOTROL XL) 10 MG 24 hr tablet Take 1 tablet (10 mg total) by mouth daily with breakfast. 90 tablet 3  . glucose blood (ONETOUCH VERIO) test strip 1 each by Other route 4 (four) times daily -  with meals and at bedtime. Use to check blood sugars four times a day Dx E11.9 125 each 11  . hydrochlorothiazide (HYDRODIURIL) 25 MG tablet 1/2 tab by mouth daily as needed  for leg swelling 90 tablet 3  . indomethacin (INDOCIN) 25 MG capsule TAKE 1 CAPSULE (25 MG TOTAL) BY MOUTH 2 (TWO) TIMES DAILY AS NEEDED. 30 capsule 2  . lisinopril (PRINIVIL,ZESTRIL) 20 MG tablet TAKE 1 TABLET BY MOUTH EVERY DAY 90 tablet 0  . lovastatin (MEVACOR) 40 MG tablet Take 1 tablet (40 mg total) by mouth at bedtime. 90 tablet 3  . metFORMIN (GLUCOPHAGE) 1000 MG tablet take 1 tablet by mouth twice a day with food 180 tablet 3  . ONETOUCH DELICA LANCETS 24E MISC Use to help check blood sugars four times a day Dx e11.9 200  each 1  . oxyCODONE (OXY IR/ROXICODONE) 5 MG immediate release tablet Take 1 tablet (5 mg total) by mouth every 6 (six) hours as needed for moderate pain. 25 tablet 0  . triamcinolone (NASACORT) 55 MCG/ACT AERO nasal inhaler Place 2 sprays into the nose daily. 1 Inhaler 12   No current facility-administered medications on file prior to visit.    Review of Systems  Constitutional: Negative for other unusual diaphoresis or sweats HENT: Negative for ear discharge or swelling Eyes: Negative for other worsening visual disturbances Respiratory: Negative for stridor or other swelling  Gastrointestinal: Negative for worsening distension or other blood Genitourinary: Negative for retention or other urinary change Musculoskeletal: Negative for other MSK pain or swelling Skin: Negative for color change or other new lesions Neurological: Negative for worsening tremors and other numbness  Psychiatric/Behavioral: Negative for worsening agitation or other fatigue All other system neg per pt    Objective:   Physical Exam BP (!) 142/90   Pulse 67   Temp 97.9 F (36.6 C) (Oral)   Ht _0  (1.727 m)   Wt 215 lb (97.5 kg)   SpO2 98%   BMI 32.69 kg/m  VS noted,  Constitutional: Pt appears in NAD HENT: Head: NCAT.  Right Ear: External ear normal.  Left Ear: External ear normal.  Eyes: . Pupils are equal, round, and reactive to light. Conjunctivae and EOM are normal Nose: without d/c or deformity Neck: Neck supple. Gross normal ROM Cardiovascular: Normal rate and regular rhythm.   Pulmonary/Chest: Effort normal and breath sounds without rales or wheezing.  Abd:  Soft, NT, ND, + BS, no organomegaly Tender right trapezoid ridge without rash or swelling Neurological: Pt is alert. At baseline orientation, motor grossly intact Skin: Skin is warm. No rashes, other new lesions, no LE edema Psychiatric: Pt behavior is normal without agitation  No other exam findings Lab Results  Component Value Date     WBC 7.0 04/01/2018   HGB 11.7 (L) 04/01/2018   HCT 36.3 (L) 04/01/2018   PLT 333.0 04/01/2018   GLUCOSE 111 (H) 01/30/2018   CHOL 187 12/10/2017   TRIG 178.0 (H) 12/10/2017   HDL 36.80 (L) 12/10/2017   LDLCALC 114 (H) 12/10/2017   ALT 30 01/30/2018   AST 15 01/30/2018   NA 138 01/30/2018   K 3.8 01/30/2018   CL 104 01/30/2018   CREATININE 1.15 01/30/2018   BUN 20 01/30/2018   CO2 24 01/30/2018   PSA 0.81 04/01/2018   HGBA1C 8.7 (H) 01/30/2018   MICROALBUR 0.9 11/16/2016       Assessment & Plan:

## 2018-04-01 NOTE — Assessment & Plan Note (Signed)
stable overall by history and exam, recent data reviewed with pt, and pt to continue medical treatment as before,  to f/u any worsening symptoms or concerns  

## 2018-04-01 NOTE — Assessment & Plan Note (Signed)
Recent mild, no overt bleeding, to check iron/b12 but suspect mild anemia of chronic dz

## 2018-04-01 NOTE — Patient Instructions (Signed)
You had the pain shot (toradol) today  Please take all new medication as prescribed - the tramadol, and muscle relaxer as needed  Please consider seeing Sports Medicine in this office for trigger point injection if not improved in 3-5 days  Please continue all other medications as before, and refills have been done if requested.  Please have the pharmacy call with any other refills you may need.  Please continue your efforts at being more active, low cholesterol diet, and weight control.  You are otherwise up to date with prevention measures today.  Please keep your appointments with your specialists as you may have planned  Please go to the LAB in the Basement (turn left off the elevator) for the tests to be done today  You will be contacted by phone if any changes need to be made immediately.  Otherwise, you will receive a letter about your results with an explanation, but please check with MyChart first.  Please remember to sign up for MyChart if you have not done so, as this will be important to you in the future with finding out test results, communicating by private email, and scheduling acute appointments online when needed.  Please return in 1 month, or sooner if needed

## 2018-04-02 ENCOUNTER — Telehealth: Payer: Self-pay | Admitting: Internal Medicine

## 2018-04-02 NOTE — Telephone Encounter (Signed)
Copied from [place] [id number]. Topic: General - Other >> Apr 02, 2018  7:09 AM Lennox Solders wrote: Reason for CRM:pt saw dr Jenny Reichmann yesterday and was prescribed tramadol and flexeril yesterday. Pt is calling this morning to report the medication is not working for his  Back/shoulder pain. Please advice. [facility]

## 2018-04-02 NOTE — Telephone Encounter (Signed)
I would definitely see Sports medicine in this office, as I would not be able to give other medications any stronger than this

## 2018-04-02 NOTE — Telephone Encounter (Signed)
Please advise 

## 2018-04-02 NOTE — Telephone Encounter (Signed)
Patient calling and states that he has not heard anything back from Dr Jenny Reichmann yet. Advised that he has been in rooms with patient's and has not had a chance to advise yet. Patient states that the medication has not helped at all. States that he is still hollering out in pain. Would like to know what else could be done? States that if he has not heard anything a little closer to 5, he was coming up to the office. Advised him to wait and see what Dr Jenny Reichmann would like for him to do.

## 2018-04-03 MED ORDER — TRAMADOL HCL 50 MG PO TABS: 50.0000 mg | ORAL_TABLET | Freq: Four times a day (QID) | ORAL | 1 refills | Status: DC | PRN

## 2018-04-03 MED ORDER — CYCLOBENZAPRINE HCL 5 MG PO TABS: 5.0000 mg | ORAL_TABLET | Freq: Three times a day (TID) | ORAL | 1 refills | Status: DC | PRN

## 2018-04-03 NOTE — Telephone Encounter (Signed)
Done erx - refills for ultram and flexeril

## 2018-04-03 NOTE — Telephone Encounter (Signed)
Pt called back and scheduled with Dr. Raeford Razor on 11/15.  Pt states he doesn't have enough medication to get him through to that appointment.  Pt wants to know what he needs to do in the interim.

## 2018-04-03 NOTE — Addendum Note (Signed)
Addended by: [name] on: 04/03/2018 12:14 PM   Modules accepted: Orders

## 2018-04-08 ENCOUNTER — Other Ambulatory Visit: Payer: Self-pay | Admitting: Internal Medicine

## 2018-04-08 DIAGNOSIS — I10 Essential (primary) hypertension: Secondary | ICD-10-CM

## 2018-04-12 ENCOUNTER — Ambulatory Visit (INDEPENDENT_AMBULATORY_CARE_PROVIDER_SITE_OTHER): Payer: Self-pay | Admitting: Family Medicine

## 2018-04-12 ENCOUNTER — Encounter: Payer: Self-pay | Admitting: Family Medicine

## 2018-04-12 DIAGNOSIS — M542 Cervicalgia: Secondary | ICD-10-CM

## 2018-04-12 NOTE — Assessment & Plan Note (Signed)
Pain seems to be more myofascial in nature.  Likely related to his posture as he is very chest concave.  Does not appear to be associated with rotator cuff. -Trigger point injections today. -Counseled on home exercise therapy and supportive care. -If no improvement can consider subacromial injection or imaging of the neck.  Could consider physical therapy.

## 2018-04-12 NOTE — Patient Instructions (Addendum)
Nice to see you  Please try the exercises  Please try getting a massage  Please try heat on the area  Please see me back in 2-3 weeks if your pain isn't improved.

## 2018-04-12 NOTE — Progress Notes (Signed)
Steven [name] - 57 y.o. male MRN [id number]  Date of birth: 23-Aug-1960  SUBJECTIVE:  Including CC & ROS.  Chief Complaint  Patient presents with  . Back Pain  . Shoulder Pain   I Steven [name] am serving as a Education administrator for Dr. Clearance Coots.  Steven [name] is a 57 y.o. male that is presenting for right trapezius pain. Going on for about a month. Shoulder pain from the neck to the shoulder blade on the right. States the "shoulder doesn't want to do anything". Neck ROM is limited.  Pain is severe when he is not taking his medications.  Denies any radicular symptoms.  Denies any inciting event.  No prior history of shoulder surgery or neck surgery.  Feels like the pain is staying the same.  Any movement seems to make it worse.  He is right-handed.  He works in Architect.  Independent review of his shoulder x-ray from 2014 shows normal exam   Review of Systems  Constitutional: Negative for fever.  HENT: Negative for congestion.   Respiratory: Negative for cough.   Cardiovascular: Negative for chest pain.  Gastrointestinal: Negative for abdominal pain.  Musculoskeletal: Positive for back pain.  Skin: Negative for color change.  Neurological: Negative for weakness.  Hematological: Negative for adenopathy.  Psychiatric/Behavioral: Negative for agitation.    HISTORY: Past Medical, Surgical, Social, and Family History Reviewed & Updated per EMR.   Pertinent Historical Findings include:  Past Medical History:  Diagnosis Date  . Chicken pox   . Diabetes mellitus without complication ([place])   . Hypertension     Past Surgical History:  Procedure Laterality Date  . INGUINAL HERNIA REPAIR Right 12/19/2017   Procedure: HERNIA REPAIR INGUINAL RIGHT;  Surgeon: Coralie Keens, MD;  Location: [facility];  Service: General;  Laterality: Right;  . INSERTION OF MESH Right 12/19/2017   Procedure: INSERTION OF MESH;  Surgeon: Coralie Keens, MD;  Location: [facility];  Service: General;  Laterality:  Right;  . MASS EXCISION Right 12/19/2017   Procedure: EXCISION OF RIGHT SCROTAL MASS;  Surgeon: Coralie Keens, MD;  Location: [facility];  Service: General;  Laterality: Right;  . TONSILLECTOMY      No Known Allergies  Family History  Problem Relation Age of Onset  . Heart disease Mother   . Hypertension Mother   . Diabetes Mother   . Stroke Father   . Hypertension Father   . Stroke Brother      Social History   Socioeconomic History  . Marital status: Single    Spouse name: Not on file  . Number of children: 1  . Years of education: 41  . Highest education level: Not on file  Occupational History  . Occupation: Barista  . Financial resource strain: Not on file  . Food insecurity:    Worry: Not on file    Inability: Not on file  . Transportation needs:    Medical: Not on file    Non-medical: Not on file  Tobacco Use  . Smoking status: Former Smoker    Packs/day: 2.00    Years: 25.00    Pack years: 50.00    Types: Cigarettes  . Smokeless tobacco: Former Systems developer    Types: Chew  Substance and Sexual Activity  . Alcohol use: Yes    Comment: occasionally  . Drug use: No  . Sexual activity: Yes    Birth control/protection: Condom  Lifestyle  . Physical activity:    Days per week: Not  on file    Minutes per session: Not on file  . Stress: Not on file  Relationships  . Social connections:    Talks on phone: Not on file    Gets together: Not on file    Attends religious service: Not on file    Active member of club or organization: Not on file    Attends meetings of clubs or organizations: Not on file    Relationship status: Not on file  . Intimate partner violence:    Fear of current or ex partner: Not on file    Emotionally abused: Not on file    Physically abused: Not on file    Forced sexual activity: Not on file  Other Topics Concern  . Not on file  Social History Narrative   Fun: Just working     PHYSICAL EXAM:  VS: BP 124/76  (BP Location: Left Arm, Patient Position: Sitting)   Pulse 73   Ht 5\' 8"  (1.727 m)   Wt 213 lb (96.6 kg)   SpO2 94%   BMI 32.39 kg/m  Physical Exam Gen: NAD, alert, cooperative with exam, well-appearing ENT: normal lips, normal nasal mucosa,  Eye: normal EOM, normal conjunctiva and lids CV:  no edema, +2 pedal pulses   Resp: no accessory muscle use, non-labored,  Skin: no rashes, no areas of induration  Neuro: normal tone, normal sensation to touch Psych:  normal insight, alert and oriented MSK:  Neck: No tenderness to palpation of the midline cervical spine. Some tenderness to palpation over the right trapezius Normal strength resistance with shrug. Normal right shoulder range of motion. Normal external rotation. No pain with empty can testing or Hawkins testing. Normal grip strength. Neurovascularly intact   Aspiration/Injection Procedure Note Steven [name] [birth year]  Procedure: Injection Indications: Right trapezius pain with trigger points  Procedure Details Consent: Risks of procedure as well as the alternatives and risks of each were explained to the (patient/caregiver).  Consent for procedure obtained. Time Out: Verified patient identification, verified procedure, site/side was marked, verified correct patient position, special equipment/implants available, medications/allergies/relevent history reviewed, required imaging and test results available.  Performed.  The area was cleaned with iodine and alcohol swabs.    The right trapezius trigger points was injected using 1 cc's of 40 mg Depomedrol and 4 cc's of 0.5% bupivacaine with a 25 1" needle.  A sterile dressing was applied.  Patient did tolerate procedure well.       ASSESSMENT & PLAN:   Neck pain Pain seems to be more myofascial in nature.  Likely related to his posture as he is very chest concave.  Does not appear to be associated with rotator cuff. -Trigger point injections today. -Counseled on home  exercise therapy and supportive care. -If no improvement can consider subacromial injection or imaging of the neck.  Could consider physical therapy.   The above documentation has been reviewed and is accurate and complete. Clearance Coots, MD 04/12/2018, 11:12 AM>

## 2018-04-30 ENCOUNTER — Other Ambulatory Visit (INDEPENDENT_AMBULATORY_CARE_PROVIDER_SITE_OTHER): Payer: Self-pay

## 2018-04-30 ENCOUNTER — Encounter: Payer: Self-pay | Admitting: Internal Medicine

## 2018-04-30 ENCOUNTER — Ambulatory Visit (INDEPENDENT_AMBULATORY_CARE_PROVIDER_SITE_OTHER): Payer: Self-pay | Admitting: Internal Medicine

## 2018-04-30 VITALS — BP 144/92 | HR 64 | Temp 97.7°F | Ht 68.0 in | Wt 213.0 lb

## 2018-04-30 DIAGNOSIS — E1165 Type 2 diabetes mellitus with hyperglycemia: Secondary | ICD-10-CM

## 2018-04-30 DIAGNOSIS — I10 Essential (primary) hypertension: Secondary | ICD-10-CM

## 2018-04-30 DIAGNOSIS — M542 Cervicalgia: Secondary | ICD-10-CM

## 2018-04-30 LAB — HEPATIC FUNCTION PANEL
ALBUMIN: 4.3 g/dL (ref 3.5–5.2)
ALK PHOS: 57 U/L (ref 39–117)
ALT: 34 U/L (ref 0–53)
AST: 14 U/L (ref 0–37)
Bilirubin, Direct: 0 mg/dL (ref 0.0–0.3)
Total Bilirubin: 0.3 mg/dL (ref 0.2–1.2)
Total Protein: 7.3 g/dL (ref 6.0–8.3)

## 2018-04-30 LAB — BASIC METABOLIC PANEL
BUN: 17 mg/dL (ref 6–23)
CO2: 24 meq/L (ref 19–32)
Calcium: 9.1 mg/dL (ref 8.4–10.5)
Chloride: 100 mEq/L (ref 96–112)
Creatinine, Ser: 0.86 mg/dL (ref 0.40–1.50)
GFR: 116.73 mL/min (ref >60.00)
Glucose, Bld: 271 mg/dL — ABNORMAL HIGH (ref 70–99)
POTASSIUM: 4.3 meq/L (ref 3.5–5.1)
Sodium: 133 mEq/L — ABNORMAL LOW (ref 135–145)

## 2018-04-30 LAB — LIPID PANEL
CHOL/HDL RATIO: 3
Cholesterol: 140 mg/dL (ref 0–200)
HDL: 40.8 mg/dL (ref >39.00)
LDL CALC: 73 mg/dL (ref 0–99)
NonHDL: 99.06
Triglycerides: 131 mg/dL (ref 0.0–149.0)
VLDL: 26.2 mg/dL (ref 0.0–40.0)

## 2018-04-30 LAB — HEMOGLOBIN A1C: Hgb A1c MFr Bld: 9.5 % — ABNORMAL HIGH (ref 4.6–6.5)

## 2018-04-30 MED ORDER — GABAPENTIN 100 MG PO CAPS: 100.0000 mg | ORAL_CAPSULE | Freq: Three times a day (TID) | ORAL | 3 refills | Status: DC

## 2018-04-30 MED ORDER — TRAMADOL HCL 50 MG PO TABS: 50.0000 mg | ORAL_TABLET | Freq: Four times a day (QID) | ORAL | 2 refills | Status: DC | PRN

## 2018-04-30 MED ORDER — KETOROLAC TROMETHAMINE 30 MG/ML IJ SOLN
30.0000 mg | Freq: Once | INTRAMUSCULAR | Status: AC
  Administered 2018-04-30: 30 mg via INTRAMUSCULAR

## 2018-04-30 NOTE — Assessment & Plan Note (Signed)
Persistent despite conservative tx, for MRI c spine, toradol 30 mg IM x 1 today, gabapentin 100 tid trial, tramadol prn, and f/u sports medicine later this wk as planned

## 2018-04-30 NOTE — Assessment & Plan Note (Signed)
Mild elevated, pt declines increased med tx today, due to pain

## 2018-04-30 NOTE — Progress Notes (Signed)
Subjective:    Patient ID: Steven [name], male    DOB: [birth year], 57 y.o.   MRN: [id number]  HPI  Here to f/u; overall doing ok,  Pt denies chest pain, increasing sob or doe, wheezing, orthopnea, PND, increased LE swelling, palpitations, dizziness or syncope.  Pt denies new neurological symptoms such as new headache, or facial or extremity weakness or numbness.  Pt denies polydipsia, polyuria, or low sugar episode.  Pt states overall good compliance with meds, mostly trying to follow appropriate diet, with wt overall stable,  but little exercise however. BP Readings from Last 3 Encounters:  04/30/18 (!) 144/92  04/12/18 124/76  04/01/18 (!) 142/90  Also has persistent mod to now severe worsening right neck and upper back oain with radiation to the right shoulder area, burning, at least 2 mo, not better so far, did see sports med but trigger point inj did not seem to help Past Medical History:  Diagnosis Date  . Chicken pox   . Diabetes mellitus without complication ([place])   . Hypertension    Past Surgical History:  Procedure Laterality Date  . INGUINAL HERNIA REPAIR Right 12/19/2017   Procedure: HERNIA REPAIR INGUINAL RIGHT;  Surgeon: Coralie Keens, MD;  Location: [facility];  Service: General;  Laterality: Right;  . INSERTION OF MESH Right 12/19/2017   Procedure: INSERTION OF MESH;  Surgeon: Coralie Keens, MD;  Location: [facility];  Service: General;  Laterality: Right;  . MASS EXCISION Right 12/19/2017   Procedure: EXCISION OF RIGHT SCROTAL MASS;  Surgeon: Coralie Keens, MD;  Location: [facility];  Service: General;  Laterality: Right;  . TONSILLECTOMY      reports that he has quit smoking. His smoking use included cigarettes. He has a 50.00 pack-year smoking history. He has quit using smokeless tobacco.  His smokeless tobacco use included chew. He reports that he drinks alcohol. He reports that he does not use drugs. family history includes Diabetes in his mother; Heart disease in his  mother; Hypertension in his father and mother; Stroke in his brother and father. No Known Allergies Current Outpatient Medications on File Prior to Visit  Medication Sig Dispense Refill  . acetaminophen (TYLENOL) 500 MG tablet Take 1 tablet (500 mg total) by mouth every 6 (six) hours as needed. 30 tablet 0  . allopurinol (ZYLOPRIM) 100 MG tablet Take 1 tablet (100 mg total) by mouth 2 (two) times daily. 180 tablet 3  . blood glucose meter kit and supplies KIT Dispense based on patient and insurance preference. Use up to four times daily as directed. (FOR ICD-9 250.00, 250.01). 1 each 0  . Blood Glucose Monitoring Suppl ([place]) w/Device KIT 1 kit by Does not apply route daily. Use as directed to check blood sugar daily Dx E11.9 1 kit 0  . cyclobenzaprine (FLEXERIL) 5 MG tablet Take 1 tablet (5 mg total) by mouth 3 (three) times daily as needed for muscle spasms. 30 tablet 1  . Diclofenac Sodium (PENNSAID) 2 % SOLN Place 1 application onto the skin 2 (two) times daily. 1 Bottle 3  . docusate sodium (COLACE) 100 MG capsule Take 1 capsule (100 mg total) by mouth daily. 10 capsule 0  . glipiZIDE (GLUCOTROL XL) 10 MG 24 hr tablet Take 1 tablet (10 mg total) by mouth daily with breakfast. 90 tablet 3  . glucose blood (ONETOUCH VERIO) test strip 1 each by Other route 4 (four) times daily -  with meals and at bedtime. Use to check  blood sugars four times a day Dx E11.9 125 each 11  . hydrochlorothiazide (HYDRODIURIL) 25 MG tablet 1/2 tab by mouth daily as needed for leg swelling 90 tablet 3  . indomethacin (INDOCIN) 25 MG capsule TAKE 1 CAPSULE (25 MG TOTAL) BY MOUTH 2 (TWO) TIMES DAILY AS NEEDED. 30 capsule 2  . lisinopril (PRINIVIL,ZESTRIL) 20 MG tablet TAKE 1 TABLET BY MOUTH EVERY DAY 90 tablet 0  . lovastatin (MEVACOR) 40 MG tablet Take 1 tablet (40 mg total) by mouth at bedtime. 90 tablet 3  . metFORMIN (GLUCOPHAGE) 1000 MG tablet take 1 tablet by mouth twice a day with food 180  tablet 3  . ONETOUCH DELICA LANCETS 13Y MISC Use to help check blood sugars four times a day Dx e11.9 200 each 1  . oxyCODONE (OXY IR/ROXICODONE) 5 MG immediate release tablet Take 1 tablet (5 mg total) by mouth every 6 (six) hours as needed for moderate pain. 25 tablet 0  . triamcinolone (NASACORT) 55 MCG/ACT AERO nasal inhaler Place 2 sprays into the nose daily. 1 Inhaler 12   No current facility-administered medications on file prior to visit.    Review of Systems  Constitutional: Negative for other unusual diaphoresis or sweats HENT: Negative for ear discharge or swelling Eyes: Negative for other worsening visual disturbances Respiratory: Negative for stridor or other swelling  Gastrointestinal: Negative for worsening distension or other blood Genitourinary: Negative for retention or other urinary change Musculoskeletal: Negative for other MSK pain or swelling Skin: Negative for color change or other new lesions Neurological: Negative for worsening tremors and other numbness  Psychiatric/Behavioral: Negative for worsening agitation or other fatigue ALl other system neg per pt    Objective:   Physical Exam BP (!) 144/92   Pulse 64   Temp 97.7 F (36.5 C) (Oral)   Ht '5\' 8"'  (1.727 m)   Wt 213 lb (96.6 kg)   SpO2 95%   BMI 32.39 kg/m  VS noted,  Constitutional: Pt appears in NAD HENT: Head: NCAT.  Right Ear: External ear normal.  Left Ear: External ear normal.  Eyes: . Pupils are equal, round, and reactive to light. Conjunctivae and EOM are normal Nose: without d/c or deformity Neck: Neck supple. Gross normal ROM Cardiovascular: Normal rate and regular rhythm.   Pulmonary/Chest: Effort normal and breath sounds without rales or wheezing.  Abd:  Soft, NT, ND, + BS, no organomegaly Neurological: Pt is alert. At baseline orientation, motor grossly intact Skin: Skin is warm. No rashes, other new lesions, no LE edema Psychiatric: Pt behavior is normal without agitation  No  other exam findings Lab Results  Component Value Date   WBC 7.0 04/01/2018   HGB 11.7 (L) 04/01/2018   HCT 36.3 (L) 04/01/2018   PLT 333.0 04/01/2018   GLUCOSE 271 (H) 04/30/2018   CHOL 140 04/30/2018   TRIG 131.0 04/30/2018   HDL 40.80 04/30/2018   LDLCALC 73 04/30/2018   ALT 34 04/30/2018   AST 14 04/30/2018   NA 133 (L) 04/30/2018   K 4.3 04/30/2018   CL 100 04/30/2018   CREATININE 0.86 04/30/2018   BUN 17 04/30/2018   CO2 24 04/30/2018   PSA 0.81 04/01/2018   HGBA1C 9.5 (H) 04/30/2018   MICROALBUR 0.9 11/16/2016       Assessment & Plan:

## 2018-04-30 NOTE — Assessment & Plan Note (Signed)
stable overall by history and exam, recent data reviewed with pt, and pt to continue medical treatment as before,  to f/u any worsening symptoms or concerns, for f/u labs 

## 2018-04-30 NOTE — Patient Instructions (Signed)
You had the pain shot today (toradol 30 mg)  Please take all new medication as prescribed - the gabapentin 100 mg three times per day  Please continue all other medications as before, and refills have been done if requested - the tramadol  Please have the pharmacy call with any other refills you may need.  Please continue your efforts at being more active, low cholesterol diet, and weight control.  Please keep your appointments with your specialists as you may have planned - Dr Tamala Julian on Friday  You will be contacted regarding the referral for: MRI for the neck  Please go to the LAB in the Basement (turn left off the elevator) for the tests to be done today  You will be contacted by phone if any changes need to be made immediately.  Otherwise, you will receive a letter about your results with an explanation, but please check with MyChart first.  Please remember to sign up for MyChart if you have not done so, as this will be important to you in the future with finding out test results, communicating by private email, and scheduling acute appointments online when needed.  Please return in 6 months, or sooner if needed

## 2018-05-01 ENCOUNTER — Telehealth: Payer: Self-pay

## 2018-05-01 ENCOUNTER — Other Ambulatory Visit: Payer: Self-pay | Admitting: Internal Medicine

## 2018-05-01 ENCOUNTER — Encounter: Payer: Self-pay | Admitting: Internal Medicine

## 2018-05-01 MED ORDER — EMPAGLIFLOZIN 25 MG PO TABS: 25.0000 mg | ORAL_TABLET | Freq: Every day | ORAL | 3 refills | Status: DC

## 2018-05-01 NOTE — Telephone Encounter (Signed)
-----   Message from [name], MD sent at 05/01/2018  9:04 AM EST ----- Letter sent, cont same tx except  The test results show that your current treatment is OK, except the a1c is still too high, even with taking the glucotrol and metformin as you do.  We need to add Jardiance 25 mg per day if ok with the insurance (or have the pharmacy let us know of a similar medication that would be covered).  I will send the prescription, and you should hear from the office as well     Lional Icenogle to please inform pt, I will do rx

## 2018-05-01 NOTE — Telephone Encounter (Signed)
Called pt, LVM.   CRM created.  

## 2018-05-03 ENCOUNTER — Other Ambulatory Visit: Payer: Self-pay | Admitting: Internal Medicine

## 2018-05-03 ENCOUNTER — Encounter: Payer: Self-pay | Admitting: Family Medicine

## 2018-05-03 ENCOUNTER — Ambulatory Visit (INDEPENDENT_AMBULATORY_CARE_PROVIDER_SITE_OTHER): Payer: Self-pay | Admitting: Family Medicine

## 2018-05-03 DIAGNOSIS — M25471 Effusion, right ankle: Secondary | ICD-10-CM

## 2018-05-03 DIAGNOSIS — M109 Gout, unspecified: Secondary | ICD-10-CM

## 2018-05-03 DIAGNOSIS — M25571 Pain in right ankle and joints of right foot: Secondary | ICD-10-CM

## 2018-05-03 DIAGNOSIS — M542 Cervicalgia: Secondary | ICD-10-CM

## 2018-05-03 NOTE — Patient Instructions (Signed)
Good to see you  Please try the exercises  Please try heat on the area  We'll see what the MRI shows.

## 2018-05-03 NOTE — Progress Notes (Signed)
Steven [name] - 57 y.o. male MRN [id number]  Date of birth: 1960-10-29  SUBJECTIVE:  Including CC & ROS.  No chief complaint on file.   Steven [name] is a 57 y.o. male that is presenting with ongoing trapezius pain.  He has an MRI scheduled for the cervical spine and been started on gabapentin.  He also has tramadol to take as needed.  He had some improvement with the previous trigger point injections but the pain has returned.  He reports intermittent radicular symptoms.  Having pain over the medial border of the right scapula as well as the central trapezius.  Pain is sharp and stabbing in nature.  Pain is moderate in severity.  Pain seems to be worse when he is working.    Review of Systems  Constitutional: Negative for fever.  HENT: Negative for congestion.   Respiratory: Negative for cough.   Cardiovascular: Negative for chest pain.  Gastrointestinal: Negative for abdominal pain.  Musculoskeletal: Positive for neck pain.  Skin: Negative for color change.  Neurological: Negative for weakness.  Hematological: Negative for adenopathy.  Psychiatric/Behavioral: Negative for agitation.    HISTORY: Past Medical, Surgical, Social, and Family History Reviewed & Updated per EMR.   Pertinent Historical Findings include:  Past Medical History:  Diagnosis Date  . Chicken pox   . Diabetes mellitus without complication ([place])   . Hypertension     Past Surgical History:  Procedure Laterality Date  . INGUINAL HERNIA REPAIR Right 12/19/2017   Procedure: HERNIA REPAIR INGUINAL RIGHT;  Surgeon: Coralie Keens, MD;  Location: [facility];  Service: General;  Laterality: Right;  . INSERTION OF MESH Right 12/19/2017   Procedure: INSERTION OF MESH;  Surgeon: Coralie Keens, MD;  Location: [facility];  Service: General;  Laterality: Right;  . MASS EXCISION Right 12/19/2017   Procedure: EXCISION OF RIGHT SCROTAL MASS;  Surgeon: Coralie Keens, MD;  Location: [facility];  Service: General;  Laterality:  Right;  . TONSILLECTOMY      No Known Allergies  Family History  Problem Relation Age of Onset  . Heart disease Mother   . Hypertension Mother   . Diabetes Mother   . Stroke Father   . Hypertension Father   . Stroke Brother      Social History   Socioeconomic History  . Marital status: Single    Spouse name: Not on file  . Number of children: 1  . Years of education: 39  . Highest education level: Not on file  Occupational History  . Occupation: Barista  . Financial resource strain: Not on file  . Food insecurity:    Worry: Not on file    Inability: Not on file  . Transportation needs:    Medical: Not on file    Non-medical: Not on file  Tobacco Use  . Smoking status: Former Smoker    Packs/day: 2.00    Years: 25.00    Pack years: 50.00    Types: Cigarettes  . Smokeless tobacco: Former Systems developer    Types: Chew  Substance and Sexual Activity  . Alcohol use: Yes    Comment: occasionally  . Drug use: No  . Sexual activity: Yes    Birth control/protection: Condom  Lifestyle  . Physical activity:    Days per week: Not on file    Minutes per session: Not on file  . Stress: Not on file  Relationships  . Social connections:    Talks on phone: Not on file  Gets together: Not on file    Attends religious service: Not on file    Active member of club or organization: Not on file    Attends meetings of clubs or organizations: Not on file    Relationship status: Not on file  . Intimate partner violence:    Fear of current or ex partner: Not on file    Emotionally abused: Not on file    Physically abused: Not on file    Forced sexual activity: Not on file  Other Topics Concern  . Not on file  Social History Narrative   Fun: Just working     PHYSICAL EXAM:  VS: BP 120/78   Pulse 68   Resp 16   Wt 213 lb (96.6 kg)   SpO2 98%   BMI 32.39 kg/m  Physical Exam Gen: NAD, alert, cooperative with exam, well-appearing ENT: normal lips,  normal nasal mucosa,  Eye: normal EOM, normal conjunctiva and lids CV:  no edema, +2 pedal pulses   Resp: no accessory muscle use, non-labored,  Skin: no rashes, no areas of induration  Neuro: normal tone, normal sensation to touch Psych:  normal insight, alert and oriented MSK:  Neck: No tenderness to palpation over the cervical midline or thoracic midline spine. Tenderness to palpation over the right trapezius as well as the rhomboids and the medial border of the scapula. No scapular winging. Normal strength resistance with shrug. Normal range of motion the right shoulder. No signs of atrophy. Normal grip strength. Neurovascular intact   Aspiration/Injection Procedure Note Steven [name] June 01, 1960  Procedure: Injection Indications: Right trapezius pain  Procedure Details Consent: Risks of procedure as well as the alternatives and risks of each were explained to the (patient/caregiver).  Consent for procedure obtained. Time Out: Verified patient identification, verified procedure, site/side was marked, verified correct patient position, special equipment/implants available, medications/allergies/relevent history reviewed, required imaging and test results available.  Performed.  The area was cleaned with iodine and alcohol swabs.    The right trapezius and rhomboid trigger points was injected using 6 cc's of 0.5% bupivacaine with a 25 1" needle.   A sterile dressing was applied.  Patient did tolerate procedure well.       ASSESSMENT & PLAN:   Neck pain Seems to have different trigger points as well as radicular symptoms in the right arm.  He has an MRI scheduled -Provided trigger point injections today without steroid. -Counseled on home exercise therapy and supportive care. -Can consider physical therapy and would consider x-ray

## 2018-05-06 NOTE — Assessment & Plan Note (Signed)
Seems to have different trigger points as well as radicular symptoms in the right arm.  He has an MRI scheduled -Provided trigger point injections today without steroid. -Counseled on home exercise therapy and supportive care. -Can consider physical therapy and would consider x-ray

## 2018-05-14 ENCOUNTER — Encounter: Payer: Self-pay | Admitting: Internal Medicine

## 2018-05-17 ENCOUNTER — Ambulatory Visit (INDEPENDENT_AMBULATORY_CARE_PROVIDER_SITE_OTHER): Payer: Self-pay | Admitting: Family Medicine

## 2018-05-17 ENCOUNTER — Encounter: Payer: Self-pay | Admitting: Family Medicine

## 2018-05-17 ENCOUNTER — Ambulatory Visit (INDEPENDENT_AMBULATORY_CARE_PROVIDER_SITE_OTHER)
Admission: RE | Admit: 2018-05-17 | Discharge: 2018-05-17 | Disposition: A | Discharge status: Home / Self Care | Payer: Self-pay | Source: Ambulatory Visit | Attending: Family Medicine | Admitting: Family Medicine

## 2018-05-17 ENCOUNTER — Other Ambulatory Visit: Payer: Self-pay

## 2018-05-17 VITALS — BP 134/80 | HR 62 | Resp 16 | Wt 215.0 lb

## 2018-05-17 DIAGNOSIS — M542 Cervicalgia: Secondary | ICD-10-CM

## 2018-05-17 MED ORDER — GABAPENTIN 100 MG PO CAPS: 100.0000 mg | ORAL_CAPSULE | Freq: Three times a day (TID) | ORAL | 3 refills | Status: DC

## 2018-05-17 NOTE — Assessment & Plan Note (Signed)
Pain is intermittent and ongoing at this time.  Still seems to be myofascial in nature. -X-ray today. -Gabapentin. -He was unable to get the cervical MRI.  Reports he needs to have it done at [facility].

## 2018-05-17 NOTE — Patient Instructions (Signed)
Good to see you  Please continue the exercises  Please try heat on the area  I will call about the results from today  Please see me back in in 4-6 weeks.

## 2018-05-17 NOTE — Progress Notes (Signed)
Steven [name] - 57 y.o. male MRN [id number]  Date of birth: [birth year]  SUBJECTIVE:  Including CC & ROS.  No chief complaint on file.   Steven [name] is a 57 y.o. male that is following up for his ongoing right trapezius pain.  He has received a steroid trigger point injection and injections with just anesthetic.  He reports some improvement with these injections but the pain seems to return.  He denies any radicular symptoms today.  The pain seems to be localized periscapular nature.  He reports the pain can occur with no inciting event.  The pain is throbbing.  He has not started the gabapentin.  He has tried the rub on medicine.  He has tried some of the over the counter medications with little improvement.  Weather seems to affect his pain as well..    Review of Systems  Constitutional: Negative for fever.  HENT: Negative for congestion.   Respiratory: Negative for cough.   Cardiovascular: Negative for chest pain.  Gastrointestinal: Negative for abdominal pain.  Musculoskeletal: Positive for back pain.  Skin: Negative for color change.  Neurological: Negative for weakness.  Hematological: Negative for adenopathy.  Psychiatric/Behavioral: Negative for agitation.    HISTORY: Past Medical, Surgical, Social, and Family History Reviewed & Updated per EMR.   Pertinent Historical Findings include:  Past Medical History:  Diagnosis Date  . Chicken pox   . Diabetes mellitus without complication ([place])   . Hypertension     Past Surgical History:  Procedure Laterality Date  . INGUINAL HERNIA REPAIR Right 12/19/2017   Procedure: HERNIA REPAIR INGUINAL RIGHT;  Surgeon: Coralie Keens, MD;  Location: [facility];  Service: General;  Laterality: Right;  . INSERTION OF MESH Right 12/19/2017   Procedure: INSERTION OF MESH;  Surgeon: Coralie Keens, MD;  Location: [facility];  Service: General;  Laterality: Right;  . MASS EXCISION Right 12/19/2017   Procedure: EXCISION OF RIGHT SCROTAL MASS;   Surgeon: Coralie Keens, MD;  Location: [facility];  Service: General;  Laterality: Right;  . TONSILLECTOMY      No Known Allergies  Family History  Problem Relation Age of Onset  . Heart disease Mother   . Hypertension Mother   . Diabetes Mother   . Stroke Father   . Hypertension Father   . Stroke Brother      Social History   Socioeconomic History  . Marital status: Single    Spouse name: Not on file  . Number of children: 1  . Years of education: 63  . Highest education level: Not on file  Occupational History  . Occupation: Barista  . Financial resource strain: Not on file  . Food insecurity:    Worry: Not on file    Inability: Not on file  . Transportation needs:    Medical: Not on file    Non-medical: Not on file  Tobacco Use  . Smoking status: Former Smoker    Packs/day: 2.00    Years: 25.00    Pack years: 50.00    Types: Cigarettes  . Smokeless tobacco: Former Systems developer    Types: Chew  Substance and Sexual Activity  . Alcohol use: Yes    Comment: occasionally  . Drug use: No  . Sexual activity: Yes    Birth control/protection: Condom  Lifestyle  . Physical activity:    Days per week: Not on file    Minutes per session: Not on file  . Stress: Not on file  Relationships  .  Social connections:    Talks on phone: Not on file    Gets together: Not on file    Attends religious service: Not on file    Active member of club or organization: Not on file    Attends meetings of clubs or organizations: Not on file    Relationship status: Not on file  . Intimate partner violence:    Fear of current or ex partner: Not on file    Emotionally abused: Not on file    Physically abused: Not on file    Forced sexual activity: Not on file  Other Topics Concern  . Not on file  Social History Narrative   Fun: Just working     PHYSICAL EXAM:  VS: BP 134/80   Pulse 62   Resp 16   Wt 215 lb (97.5 kg)   SpO2 98%   BMI 32.69 kg/m  Physical  Exam Gen: NAD, alert, cooperative with exam, well-appearing ENT: normal lips, normal nasal mucosa,  Eye: normal EOM, normal conjunctiva and lids CV:  no edema, +2 pedal pulses   Resp: no accessory muscle use, non-labored,  Skin: no rashes, no areas of induration  Neuro: normal tone, normal sensation to touch Psych:  normal insight, alert and oriented MSK:  Back/neck:  Normal neck range of motion. No tenderness to palpation over the midline cervical or thoracic spine. No scapular winging. Normal shoulder range of motion. Some tenderness palpation of the right trapezius. Normal grip strength. No signs of atrophy. Neurovascular intact     ASSESSMENT & PLAN:   Neck pain Pain is intermittent and ongoing at this time.  Still seems to be myofascial in nature. -X-ray today. -Gabapentin. -He was unable to get the cervical MRI.  Reports he needs to have it done at [facility].

## 2018-05-20 ENCOUNTER — Telehealth: Payer: Self-pay | Admitting: Family Medicine

## 2018-05-20 DIAGNOSIS — M542 Cervicalgia: Secondary | ICD-10-CM

## 2018-05-20 DIAGNOSIS — M5030 Other cervical disc degeneration, unspecified cervical region: Secondary | ICD-10-CM

## 2018-05-20 NOTE — Telephone Encounter (Signed)
Spoke with patient about xray. Showing different possible causes as to the pain he is experiencing.   Rosemarie Ax, MD [facility] Primary Care & Sports Medicine 05/20/2018, 8:17 AM

## 2018-06-01 ENCOUNTER — Ambulatory Visit (HOSPITAL_COMMUNITY): Admission: RE | Admit: 2018-06-01 | Payer: Self-pay | Source: Ambulatory Visit

## 2018-06-10 ENCOUNTER — Other Ambulatory Visit: Payer: Self-pay | Admitting: Internal Medicine

## 2018-06-10 DIAGNOSIS — M109 Gout, unspecified: Secondary | ICD-10-CM

## 2018-06-10 DIAGNOSIS — M25571 Pain in right ankle and joints of right foot: Secondary | ICD-10-CM

## 2018-06-10 DIAGNOSIS — M25471 Effusion, right ankle: Secondary | ICD-10-CM

## 2018-06-12 ENCOUNTER — Ambulatory Visit: Payer: Self-pay | Admitting: Internal Medicine

## 2018-06-13 ENCOUNTER — Telehealth: Payer: Self-pay | Admitting: Family Medicine

## 2018-06-13 NOTE — Telephone Encounter (Signed)
Copied from [place] [id number]. Topic: General - Other >> Jun 12, 2018  2:27 PM Antonieta Iba C wrote: Reason for CRM: pt is calling in to update provider. Pt says that he had imaging scheduled but it was cancelled due to the location. Pt says that no one has ever reached out to him to reschedule. Pt says that he would like to have both knees imaged also. Pt says that he has had one of his knees imaged but he never received results however pt states that his knees are in pain. Pt would like to have order updated to include both knee also.   Please assist.   Spoke with patient about his MRI. It appears he has been approved and should be able to schedule the MRi at [facility]. He needs to follow up if he wants to discuss his knee problems.   Rosemarie Ax, MD [facility] Primary Care & Sports Medicine 06/13/2018, 9:16 AM

## 2018-06-18 NOTE — Telephone Encounter (Addendum)
Pt saw Jodi Mourning and she ordered an Xray of his knees 11/2017.  Pt is going to have MRI /spine on 06/24/2018 and wants to have both knees done as well at the same time. Mickel Baas had stated if his pain in knees continued, he should see Sports medicince. Pt saw Dr Dartha Lodge, but his knee issue was never addressed. Pt wants someone to order additional imaging on his knees. After pt hung from call with pt after further review, I see Dr Raeford Razor advising pt making a follow up appt. I called the pt back, NA, left VM to call and schedule appt with Dr Raeford Razor to discuss knee issues.

## 2018-06-18 NOTE — Telephone Encounter (Signed)
FYI Dr. Raeford Razor

## 2018-06-20 ENCOUNTER — Other Ambulatory Visit: Payer: Self-pay | Admitting: Internal Medicine

## 2018-06-20 MED ORDER — HYDROCHLOROTHIAZIDE 12.5 MG PO CAPS: 12.5000 mg | ORAL_CAPSULE | Freq: Every day | ORAL | 3 refills | Status: DC

## 2018-06-24 ENCOUNTER — Ambulatory Visit (HOSPITAL_COMMUNITY)
Admission: RE | Admit: 2018-06-24 | Discharge: 2018-06-24 | Disposition: A | Discharge status: Home / Self Care | Payer: Self-pay | Source: Ambulatory Visit | Attending: Family Medicine | Admitting: Family Medicine

## 2018-06-24 DIAGNOSIS — M5030 Other cervical disc degeneration, unspecified cervical region: Secondary | ICD-10-CM | POA: Insufficient documentation

## 2018-06-24 DIAGNOSIS — M542 Cervicalgia: Secondary | ICD-10-CM | POA: Insufficient documentation

## 2018-06-25 ENCOUNTER — Telehealth: Payer: Self-pay | Admitting: Family Medicine

## 2018-06-25 NOTE — Telephone Encounter (Signed)
Spoke with patient about results. Can consider referral to neurosurg or PT.   Rosemarie Ax, MD [facility] Primary Care & Sports Medicine 06/25/2018, 12:24 PM

## 2018-07-03 ENCOUNTER — Other Ambulatory Visit: Payer: Self-pay | Admitting: Internal Medicine

## 2018-08-02 ENCOUNTER — Other Ambulatory Visit: Payer: Self-pay | Admitting: *Deleted

## 2018-08-02 MED ORDER — GABAPENTIN 100 MG PO CAPS: 100.0000 mg | ORAL_CAPSULE | Freq: Three times a day (TID) | ORAL | 1 refills | Status: DC

## 2018-08-02 NOTE — Telephone Encounter (Signed)
rx sent into pharmacy

## 2018-08-06 ENCOUNTER — Other Ambulatory Visit: Payer: Self-pay | Admitting: Internal Medicine

## 2018-08-06 DIAGNOSIS — IMO0001 Reserved for inherently not codable concepts without codable children: Secondary | ICD-10-CM

## 2018-08-06 DIAGNOSIS — E1165 Type 2 diabetes mellitus with hyperglycemia: Principal | ICD-10-CM

## 2018-08-20 ENCOUNTER — Other Ambulatory Visit: Payer: Self-pay | Admitting: Internal Medicine

## 2018-08-20 DIAGNOSIS — I10 Essential (primary) hypertension: Secondary | ICD-10-CM

## 2018-09-08 ENCOUNTER — Other Ambulatory Visit: Payer: Self-pay | Admitting: Internal Medicine

## 2018-09-08 DIAGNOSIS — M25471 Effusion, right ankle: Secondary | ICD-10-CM

## 2018-09-08 DIAGNOSIS — M25571 Pain in right ankle and joints of right foot: Secondary | ICD-10-CM

## 2018-09-08 DIAGNOSIS — M109 Gout, unspecified: Secondary | ICD-10-CM

## 2018-09-17 ENCOUNTER — Other Ambulatory Visit: Payer: Self-pay | Admitting: Internal Medicine

## 2018-09-17 NOTE — Telephone Encounter (Signed)
Done erx 

## 2018-10-18 ENCOUNTER — Ambulatory Visit (INDEPENDENT_AMBULATORY_CARE_PROVIDER_SITE_OTHER): Payer: Self-pay | Admitting: Internal Medicine

## 2018-10-18 DIAGNOSIS — G4710 Hypersomnia, unspecified: Secondary | ICD-10-CM

## 2018-10-18 DIAGNOSIS — E1165 Type 2 diabetes mellitus with hyperglycemia: Secondary | ICD-10-CM

## 2018-10-18 DIAGNOSIS — I10 Essential (primary) hypertension: Secondary | ICD-10-CM

## 2018-10-18 DIAGNOSIS — K219 Gastro-esophageal reflux disease without esophagitis: Secondary | ICD-10-CM | POA: Insufficient documentation

## 2018-10-18 MED ORDER — PANTOPRAZOLE SODIUM 40 MG PO TBEC: 40.0000 mg | DELAYED_RELEASE_TABLET | Freq: Every day | ORAL | 11 refills | Status: DC

## 2018-10-18 NOTE — Patient Instructions (Signed)
Please take all new medication as prescribed - the protonix for the stomach burning  Please continue all other medications as before, and refills have been done if requested.  Please have the pharmacy call with any other refills you may need.  Please continue your efforts at being more active, low cholesterol diet, and weight control.  Please keep your appointments with your specialists as you may have planned

## 2018-10-18 NOTE — Progress Notes (Signed)
Patient ID: Steven [name], male   DOB: [birth year], 58 y.o.   MRN: [id number]  Virtual Visit via Video Note  I connected with [name] on 10/18/18 at 11:00 AM EDT by a video enabled telemedicine application and verified that I am speaking with the correct person using two identifiers.  Location: Patient: outside his home walking about outside with his cellphone Provider: at office   I discussed the limitations of evaluation and management by telemedicine and the availability of in person appointments. The patient expressed understanding and agreed to proceed.  History of Present Illness: Here to f/u; overall doing ok,  Pt denies chest pain, increasing sob or doe, wheezing, orthopnea, PND, increased LE swelling, palpitations, dizziness or syncope.  Pt denies new neurological symptoms such as new headache, or facial or extremity weakness or numbness.  Pt denies polydipsia, polyuria, or low sugar episode.  Pt states overall good compliance with meds, mostly trying to follow appropriate diet, with wt overall stable,  but little exercise however.  Has had mild worsening reflux, but no abd pain, dysphagia, n/v, bowel change or blood.  Also mentions an occasional episodes of heavy sleepiness in the am such that he has to immediately take a short nap on awakening several times per wk, but not every day Past Medical History:  Diagnosis Date  . Chicken pox   . Diabetes mellitus without complication ([place])   . Hypertension    Past Surgical History:  Procedure Laterality Date  . INGUINAL HERNIA REPAIR Right 12/19/2017   Procedure: HERNIA REPAIR INGUINAL RIGHT;  Surgeon: Coralie Keens, MD;  Location: [facility];  Service: General;  Laterality: Right;  . INSERTION OF MESH Right 12/19/2017   Procedure: INSERTION OF MESH;  Surgeon: Coralie Keens, MD;  Location: [facility];  Service: General;  Laterality: Right;  . MASS EXCISION Right 12/19/2017   Procedure: EXCISION OF RIGHT SCROTAL MASS;  Surgeon:  Coralie Keens, MD;  Location: [facility];  Service: General;  Laterality: Right;  . TONSILLECTOMY      reports that he has quit smoking. His smoking use included cigarettes. He has a 50.00 pack-year smoking history. He has quit using smokeless tobacco.  His smokeless tobacco use included chew. He reports current alcohol use. He reports that he does not use drugs. family history includes Diabetes in his mother; Heart disease in his mother; Hypertension in his father and mother; Stroke in his brother and father. No Known Allergies Current Outpatient Medications on File Prior to Visit  Medication Sig Dispense Refill  . acetaminophen (TYLENOL) 500 MG tablet Take 1 tablet (500 mg total) by mouth every 6 (six) hours as needed. 30 tablet 0  . allopurinol (ZYLOPRIM) 100 MG tablet Take 1 tablet (100 mg total) by mouth 2 (two) times daily. 180 tablet 3  . blood glucose meter kit and supplies KIT Dispense based on patient and insurance preference. Use up to four times daily as directed. (FOR ICD-9 250.00, 250.01). 1 each 0  . Blood Glucose Monitoring Suppl ([place]) w/Device KIT 1 kit by Does not apply route daily. Use as directed to check blood sugar daily Dx E11.9 1 kit 0  . cyclobenzaprine (FLEXERIL) 5 MG tablet TAKE 1 TABLET BY MOUTH THREE TIMES A DAY AS NEEDED FOR MUSCLE SPASMS 30 tablet 5  . Diclofenac Sodium (PENNSAID) 2 % SOLN Place 1 application onto the skin 2 (two) times daily. 1 Bottle 3  . docusate sodium (COLACE) 100 MG capsule Take 1 capsule (100 mg  total) by mouth daily. 10 capsule 0  . empagliflozin (JARDIANCE) 25 MG TABS tablet Take 25 mg by mouth daily. 90 tablet 3  . gabapentin (NEURONTIN) 100 MG capsule Take 1 capsule (100 mg total) by mouth 3 (three) times daily. 270 capsule 1  . glipiZIDE (GLUCOTROL XL) 10 MG 24 hr tablet TAKE 1 TABLET (10 MG TOTAL) BY MOUTH DAILY WITH BREAKFAST. 90 tablet 3  . glucose blood (ONETOUCH VERIO) test strip 1 each by Other route 4 (four)  times daily -  with meals and at bedtime. Use to check blood sugars four times a day Dx E11.9 125 each 11  . hydrochlorothiazide (MICROZIDE) 12.5 MG capsule Take 1 capsule (12.5 mg total) by mouth daily. 90 capsule 3  . indomethacin (INDOCIN) 25 MG capsule TAKE 1 CAPSULE (25 MG TOTAL) BY MOUTH 2 (TWO) TIMES DAILY AS NEEDED. 90 capsule 1  . lisinopril (PRINIVIL,ZESTRIL) 20 MG tablet TAKE 1 TABLET BY MOUTH EVERY DAY 90 tablet 0  . lovastatin (MEVACOR) 40 MG tablet Take 1 tablet (40 mg total) by mouth at bedtime. 90 tablet 3  . metFORMIN (GLUCOPHAGE) 1000 MG tablet TAKE 1 TABLET BY MOUTH TWICE A DAY WITH FOOD 180 tablet 3  . ONETOUCH DELICA LANCETS 02X MISC Use to help check blood sugars four times a day Dx e11.9 200 each 1  . oxyCODONE (OXY IR/ROXICODONE) 5 MG immediate release tablet Take 1 tablet (5 mg total) by mouth every 6 (six) hours as needed for moderate pain. 25 tablet 0  . traMADol (ULTRAM) 50 MG tablet TAKE 1 TABLET (50 MG TOTAL) BY MOUTH EVERY 6 (SIX) HOURS AS NEEDED. 60 tablet 2  . triamcinolone (NASACORT) 55 MCG/ACT AERO nasal inhaler Place 2 sprays into the nose daily. 1 Inhaler 12   No current facility-administered medications on file prior to visit.     Observations/Objective: Alert, NAD, appropriate mood and affect, resps normal, cn 2-12 intact, moves all 4s, no visible rash or swelling Lab Results  Component Value Date   WBC 7.0 04/01/2018   HGB 11.7 (L) 04/01/2018   HCT 36.3 (L) 04/01/2018   PLT 333.0 04/01/2018   GLUCOSE 271 (H) 04/30/2018   CHOL 140 04/30/2018   TRIG 131.0 04/30/2018   HDL 40.80 04/30/2018   LDLCALC 73 04/30/2018   ALT 34 04/30/2018   AST 14 04/30/2018   NA 133 (L) 04/30/2018   K 4.3 04/30/2018   CL 100 04/30/2018   CREATININE 0.86 04/30/2018   BUN 17 04/30/2018   CO2 24 04/30/2018   PSA 0.81 04/01/2018   HGBA1C 9.5 (H) 04/30/2018   MICROALBUR 0.9 11/16/2016   Assessment and Plan: See notes  Follow Up Instructions: See notes   I  discussed the assessment and treatment plan with the patient. The patient was provided an opportunity to ask questions and all were answered. The patient agreed with the plan and demonstrated an understanding of the instructions.   The patient was advised to call back or seek an in-person evaluation if the symptoms worsen or if the condition fails to improve   Cathlean Cower, MD

## 2018-10-19 ENCOUNTER — Encounter: Payer: Self-pay | Admitting: Internal Medicine

## 2018-10-19 DIAGNOSIS — G4710 Hypersomnia, unspecified: Secondary | ICD-10-CM | POA: Insufficient documentation

## 2018-10-19 NOTE — Assessment & Plan Note (Signed)
[place] for protonix asd,  to f/u any worsening symptoms or concerns

## 2018-10-19 NOTE — Assessment & Plan Note (Signed)
Suggestive of osa, declines pulm referral for now

## 2018-10-19 NOTE — Assessment & Plan Note (Signed)
Pt encourage to f/u bp at home with goal BP < 140/90

## 2018-10-19 NOTE — Assessment & Plan Note (Signed)
Lab Results  Component Value Date   HGBA1C 9.5 (H) 04/30/2018  stable overall by history and exam, recent data reviewed with pt, and pt to continue medical treatment as before,  to f/u any worsening symptoms or concerns, to fu with labs in office

## 2018-10-31 ENCOUNTER — Encounter: Payer: Self-pay | Admitting: Internal Medicine

## 2018-10-31 ENCOUNTER — Ambulatory Visit (INDEPENDENT_AMBULATORY_CARE_PROVIDER_SITE_OTHER): Payer: Self-pay | Admitting: Internal Medicine

## 2018-10-31 DIAGNOSIS — N320 Bladder-neck obstruction: Secondary | ICD-10-CM

## 2018-10-31 DIAGNOSIS — I10 Essential (primary) hypertension: Secondary | ICD-10-CM

## 2018-10-31 DIAGNOSIS — E538 Deficiency of other specified B group vitamins: Secondary | ICD-10-CM

## 2018-10-31 DIAGNOSIS — E1165 Type 2 diabetes mellitus with hyperglycemia: Secondary | ICD-10-CM

## 2018-10-31 DIAGNOSIS — E559 Vitamin D deficiency, unspecified: Secondary | ICD-10-CM

## 2018-10-31 DIAGNOSIS — D649 Anemia, unspecified: Secondary | ICD-10-CM

## 2018-10-31 DIAGNOSIS — G4710 Hypersomnia, unspecified: Secondary | ICD-10-CM

## 2018-10-31 DIAGNOSIS — E611 Iron deficiency: Secondary | ICD-10-CM

## 2018-10-31 NOTE — Patient Instructions (Signed)
Please continue all other medications as before, and refills have been done if requested.  Please have the pharmacy call with any other refills you may need.  Please continue your efforts at being more active, low cholesterol diet, and weight control.  You are otherwise up to date with prevention measures today.  Please keep your appointments with your specialists as you may have planned  You will be contacted regarding the referral for: pulmonary  Please go to the LAB in the Basement (turn left off the elevator) for the tests to be done   You will be contacted by phone if any changes need to be made immediately.  Otherwise, you will receive a letter about your results with an explanation, but please check with MyChart first.  Please remember to sign up for MyChart if you have not done so, as this will be important to you in the future with finding out test results, communicating by private email, and scheduling acute appointments online when needed.  Please return in 6 months, or sooner if needed, with labs testing done 3-5 days before

## 2018-10-31 NOTE — Assessment & Plan Note (Signed)
OK for pulm referral

## 2018-10-31 NOTE — Assessment & Plan Note (Signed)
Encouraged to check BP on a regular basis, with goal < 140/90

## 2018-10-31 NOTE — Assessment & Plan Note (Signed)
No overt bleeding, ok for cbc with labs 

## 2018-10-31 NOTE — Progress Notes (Signed)
Patient ID: Steven [name], male   DOB: 1961/05/25, 58 y.o.   MRN: [id number]  Virtual Visit via Video Note  I connected with [name] on 10/31/18 at  9:00 AM EDT by a video enabled telemedicine application and verified that I am speaking with the correct person using two identifiers.  Location: Patient: in his car Provider: at office   I discussed the limitations of evaluation and management by telemedicine and the availability of in person appointments. The patient expressed understanding and agreed to proceed.  History of Present Illness: Here to f/u; overall doing ok,  Pt denies chest pain, increasing sob or doe, wheezing, orthopnea, PND, increased LE swelling, palpitations, dizziness or syncope.  Pt denies new neurological symptoms such as new headache, or facial or extremity weakness or numbness.  Pt denies polydipsia, polyuria, or low sugar episode.  Pt states overall good compliance with meds, mostly trying to follow appropriate diet, with wt overall stable,  but little exercise however. Wt stable per pt, and BP Ok per pt but neither checked recently.  No new complaints.  Declines colonoscopy or optho for now due to no insurance and plans to apply for [facility].  Does still have daytime hypersomnolence, and is ok this time with referral to pulm to r/o OSA Past Medical History:  Diagnosis Date  . Chicken pox   . Diabetes mellitus without complication ([place])   . Hypertension    Past Surgical History:  Procedure Laterality Date  . INGUINAL HERNIA REPAIR Right 12/19/2017   Procedure: HERNIA REPAIR INGUINAL RIGHT;  Surgeon: Coralie Keens, MD;  Location: [facility];  Service: General;  Laterality: Right;  . INSERTION OF MESH Right 12/19/2017   Procedure: INSERTION OF MESH;  Surgeon: Coralie Keens, MD;  Location: [facility];  Service: General;  Laterality: Right;  . MASS EXCISION Right 12/19/2017   Procedure: EXCISION OF RIGHT SCROTAL MASS;  Surgeon: Coralie Keens, MD;   Location: [facility];  Service: General;  Laterality: Right;  . TONSILLECTOMY      reports that he has quit smoking. His smoking use included cigarettes. He has a 50.00 pack-year smoking history. He has quit using smokeless tobacco.  His smokeless tobacco use included chew. He reports current alcohol use. He reports that he does not use drugs. family history includes Diabetes in his mother; Heart disease in his mother; Hypertension in his father and mother; Stroke in his brother and father. No Known Allergies' Current Outpatient Medications on File Prior to Visit  Medication Sig Dispense Refill  . acetaminophen (TYLENOL) 500 MG tablet Take 1 tablet (500 mg total) by mouth every 6 (six) hours as needed. 30 tablet 0  . allopurinol (ZYLOPRIM) 100 MG tablet Take 1 tablet (100 mg total) by mouth 2 (two) times daily. 180 tablet 3  . blood glucose meter kit and supplies KIT Dispense based on patient and insurance preference. Use up to four times daily as directed. (FOR ICD-9 250.00, 250.01). 1 each 0  . Blood Glucose Monitoring Suppl ([place]) w/Device KIT 1 kit by Does not apply route daily. Use as directed to check blood sugar daily Dx E11.9 1 kit 0  . cyclobenzaprine (FLEXERIL) 5 MG tablet TAKE 1 TABLET BY MOUTH THREE TIMES A DAY AS NEEDED FOR MUSCLE SPASMS 30 tablet 5  . Diclofenac Sodium (PENNSAID) 2 % SOLN Place 1 application onto the skin 2 (two) times daily. 1 Bottle 3  . docusate sodium (COLACE) 100 MG capsule Take 1 capsule (100  mg total) by mouth daily. 10 capsule 0  . empagliflozin (JARDIANCE) 25 MG TABS tablet Take 25 mg by mouth daily. 90 tablet 3  . gabapentin (NEURONTIN) 100 MG capsule Take 1 capsule (100 mg total) by mouth 3 (three) times daily. 270 capsule 1  . glipiZIDE (GLUCOTROL XL) 10 MG 24 hr tablet TAKE 1 TABLET (10 MG TOTAL) BY MOUTH DAILY WITH BREAKFAST. 90 tablet 3  . glucose blood (ONETOUCH VERIO) test strip 1 each by Other route 4 (four) times daily -  with  meals and at bedtime. Use to check blood sugars four times a day Dx E11.9 125 each 11  . hydrochlorothiazide (MICROZIDE) 12.5 MG capsule Take 1 capsule (12.5 mg total) by mouth daily. 90 capsule 3  . indomethacin (INDOCIN) 25 MG capsule TAKE 1 CAPSULE (25 MG TOTAL) BY MOUTH 2 (TWO) TIMES DAILY AS NEEDED. 90 capsule 1  . lisinopril (PRINIVIL,ZESTRIL) 20 MG tablet TAKE 1 TABLET BY MOUTH EVERY DAY 90 tablet 0  . lovastatin (MEVACOR) 40 MG tablet Take 1 tablet (40 mg total) by mouth at bedtime. 90 tablet 3  . metFORMIN (GLUCOPHAGE) 1000 MG tablet TAKE 1 TABLET BY MOUTH TWICE A DAY WITH FOOD 180 tablet 3  . ONETOUCH DELICA LANCETS 04U MISC Use to help check blood sugars four times a day Dx e11.9 200 each 1  . oxyCODONE (OXY IR/ROXICODONE) 5 MG immediate release tablet Take 1 tablet (5 mg total) by mouth every 6 (six) hours as needed for moderate pain. 25 tablet 0  . pantoprazole (PROTONIX) 40 MG tablet Take 1 tablet (40 mg total) by mouth daily. 30 tablet 11  . traMADol (ULTRAM) 50 MG tablet TAKE 1 TABLET (50 MG TOTAL) BY MOUTH EVERY 6 (SIX) HOURS AS NEEDED. 60 tablet 2  . triamcinolone (NASACORT) 55 MCG/ACT AERO nasal inhaler Place 2 sprays into the nose daily. 1 Inhaler 12   No current facility-administered medications on file prior to visit.     Observations/Objective: Alert, NAD, appropriate mood and affect, resps normal, cn 2-12 intact, moves all 4s, no visible rash or swelling Lab Results  Component Value Date   WBC 7.0 04/01/2018   HGB 11.7 (L) 04/01/2018   HCT 36.3 (L) 04/01/2018   PLT 333.0 04/01/2018   GLUCOSE 271 (H) 04/30/2018   CHOL 140 04/30/2018   TRIG 131.0 04/30/2018   HDL 40.80 04/30/2018   LDLCALC 73 04/30/2018   ALT 34 04/30/2018   AST 14 04/30/2018   NA 133 (L) 04/30/2018   K 4.3 04/30/2018   CL 100 04/30/2018   CREATININE 0.86 04/30/2018   BUN 17 04/30/2018   CO2 24 04/30/2018   PSA 0.81 04/01/2018   HGBA1C 9.5 (H) 04/30/2018   MICROALBUR 0.9 11/16/2016    Assessment and Plan: See notes  Follow Up Instructions: See notes   I discussed the assessment and treatment plan with the patient. The patient was provided an opportunity to ask questions and all were answered. The patient agreed with the plan and demonstrated an understanding of the instructions.   The patient was advised to call back or seek an in-person evaluation if the symptoms worsen or if the condition fails to improve as anticipated.   Cathlean Cower, MD

## 2018-10-31 NOTE — Assessment & Plan Note (Signed)
stable overall by history and exam, recent data reviewed with pt, and pt to continue medical treatment as before,  to f/u any worsening symptoms or concerns, for lab 

## 2018-11-01 ENCOUNTER — Other Ambulatory Visit (INDEPENDENT_AMBULATORY_CARE_PROVIDER_SITE_OTHER): Payer: Self-pay

## 2018-11-01 DIAGNOSIS — N320 Bladder-neck obstruction: Secondary | ICD-10-CM

## 2018-11-01 DIAGNOSIS — E559 Vitamin D deficiency, unspecified: Secondary | ICD-10-CM

## 2018-11-01 DIAGNOSIS — E1165 Type 2 diabetes mellitus with hyperglycemia: Secondary | ICD-10-CM

## 2018-11-01 DIAGNOSIS — E538 Deficiency of other specified B group vitamins: Secondary | ICD-10-CM

## 2018-11-01 DIAGNOSIS — E611 Iron deficiency: Secondary | ICD-10-CM

## 2018-11-01 LAB — URINALYSIS, ROUTINE W REFLEX MICROSCOPIC
Bilirubin Urine: NEGATIVE
Hgb urine dipstick: NEGATIVE
Ketones, ur: NEGATIVE
Leukocytes,Ua: NEGATIVE
Nitrite: NEGATIVE
RBC / HPF: NONE SEEN (ref 0–?)
Specific Gravity, Urine: 1.025 (ref 1.000–1.030)
Total Protein, Urine: NEGATIVE
Urine Glucose: NEGATIVE
Urobilinogen, UA: 0.2 (ref 0.0–1.0)
WBC, UA: NONE SEEN (ref 0–?)
pH: 5.5 (ref 5.0–8.0)

## 2018-11-01 LAB — MICROALBUMIN / CREATININE URINE RATIO
Creatinine,U: 177.4 mg/dL
Microalb Creat Ratio: 0.7 mg/g (ref 0.0–30.0)
Microalb, Ur: 1.2 mg/dL (ref 0.0–1.9)

## 2018-11-01 LAB — CBC WITH DIFFERENTIAL/PLATELET
Basophils Absolute: 0 10*3/uL (ref 0.0–0.1)
Basophils Relative: 0.3 % (ref 0.0–3.0)
Eosinophils Absolute: 0.2 10*3/uL (ref 0.0–0.7)
Eosinophils Relative: 2.9 % (ref 0.0–5.0)
HCT: 35.1 % — ABNORMAL LOW (ref 39.0–52.0)
Hemoglobin: 11.4 g/dL — ABNORMAL LOW (ref 13.0–17.0)
Lymphocytes Relative: 35 % (ref 12.0–46.0)
Lymphs Abs: 2.5 10*3/uL (ref 0.7–4.0)
MCHC: 32.4 g/dL (ref 30.0–36.0)
MCV: 78.5 fl (ref 78.0–100.0)
Monocytes Absolute: 0.5 10*3/uL (ref 0.1–1.0)
Monocytes Relative: 7.4 % (ref 3.0–12.0)
Neutro Abs: 4 10*3/uL (ref 1.4–7.7)
Neutrophils Relative %: 54.4 % (ref 43.0–77.0)
Platelets: 337 10*3/uL (ref 150.0–400.0)
RBC: 4.47 Mil/uL (ref 4.22–5.81)
RDW: 16.2 % — ABNORMAL HIGH (ref 11.5–15.5)
WBC: 7.3 10*3/uL (ref 4.0–10.5)

## 2018-11-01 LAB — LIPID PANEL
Cholesterol: 127 mg/dL (ref 0–200)
HDL: 38.3 mg/dL — ABNORMAL LOW (ref >39.00)
LDL Cholesterol: 72 mg/dL (ref 0–99)
NonHDL: 88.55
Total CHOL/HDL Ratio: 3
Triglycerides: 81 mg/dL (ref 0.0–149.0)
VLDL: 16.2 mg/dL (ref 0.0–40.0)

## 2018-11-01 LAB — HEPATIC FUNCTION PANEL
ALT: 38 U/L (ref 0–53)
AST: 20 U/L (ref 0–37)
Albumin: 4.3 g/dL (ref 3.5–5.2)
Alkaline Phosphatase: 51 U/L (ref 39–117)
Bilirubin, Direct: 0.1 mg/dL (ref 0.0–0.3)
Total Bilirubin: 0.3 mg/dL (ref 0.2–1.2)
Total Protein: 6.6 g/dL (ref 6.0–8.3)

## 2018-11-01 LAB — BASIC METABOLIC PANEL
BUN: 18 mg/dL (ref 6–23)
CO2: 22 mEq/L (ref 19–32)
Calcium: 8.9 mg/dL (ref 8.4–10.5)
Chloride: 101 mEq/L (ref 96–112)
Creatinine, Ser: 0.92 mg/dL (ref 0.40–1.50)
GFR: 101.43 mL/min (ref >60.00)
Glucose, Bld: 202 mg/dL — ABNORMAL HIGH (ref 70–99)
Potassium: 3.9 mEq/L (ref 3.5–5.1)
Sodium: 134 mEq/L — ABNORMAL LOW (ref 135–145)

## 2018-11-01 LAB — VITAMIN B12: Vitamin B-12: 456 pg/mL (ref 211–911)

## 2018-11-01 LAB — HEMOGLOBIN A1C: Hgb A1c MFr Bld: 9.3 % — ABNORMAL HIGH (ref 4.6–6.5)

## 2018-11-01 LAB — IBC PANEL
Iron: 34 ug/dL — ABNORMAL LOW (ref 42–165)
Saturation Ratios: 9.2 % — ABNORMAL LOW (ref 20.0–50.0)
Transferrin: 265 mg/dL (ref 212.0–360.0)

## 2018-11-01 LAB — TSH: TSH: 1.67 u[IU]/mL (ref 0.35–4.50)

## 2018-11-01 LAB — PSA: PSA: 0.64 ng/mL (ref 0.10–4.00)

## 2018-11-01 LAB — VITAMIN D 25 HYDROXY (VIT D DEFICIENCY, FRACTURES): VITD: 19.3 ng/mL — ABNORMAL LOW (ref 30.00–100.00)

## 2018-11-03 ENCOUNTER — Other Ambulatory Visit: Payer: Self-pay | Admitting: Internal Medicine

## 2018-11-03 ENCOUNTER — Encounter: Payer: Self-pay | Admitting: Internal Medicine

## 2018-11-03 DIAGNOSIS — D509 Iron deficiency anemia, unspecified: Secondary | ICD-10-CM

## 2018-11-03 MED ORDER — VITAMIN D (ERGOCALCIFEROL) 1.25 MG (50000 UNIT) PO CAPS: 50000.0000 [IU] | ORAL_CAPSULE | ORAL | 0 refills | Status: DC

## 2018-11-03 MED ORDER — INSULIN GLARGINE 100 UNITS/ML SOLOSTAR PEN: 10.0000 [IU] | PEN_INJECTOR | Freq: Every day | SUBCUTANEOUS | 11 refills | Status: DC

## 2018-11-03 MED ORDER — POLYSACCHARIDE IRON COMPLEX 150 MG PO CAPS: 150.0000 mg | ORAL_CAPSULE | Freq: Every day | ORAL | 1 refills | Status: DC

## 2018-11-03 MED ORDER — INSULIN GLARGINE 100 UNIT/ML SOLOSTAR PEN: 10.0000 [IU] | PEN_INJECTOR | Freq: Every day | SUBCUTANEOUS | 11 refills | Status: DC

## 2018-11-04 ENCOUNTER — Other Ambulatory Visit: Payer: Self-pay | Admitting: Internal Medicine

## 2018-11-04 MED ORDER — GLIPIZIDE ER 10 MG PO TB24: 10.0000 mg | ORAL_TABLET | Freq: Two times a day (BID) | ORAL | 3 refills | Status: DC

## 2018-11-19 ENCOUNTER — Other Ambulatory Visit: Payer: Self-pay | Admitting: Internal Medicine

## 2018-11-19 ENCOUNTER — Ambulatory Visit: Payer: Self-pay | Admitting: Nurse Practitioner

## 2018-11-19 DIAGNOSIS — I10 Essential (primary) hypertension: Secondary | ICD-10-CM

## 2018-11-22 ENCOUNTER — Other Ambulatory Visit: Payer: Self-pay | Admitting: Internal Medicine

## 2019-01-05 ENCOUNTER — Other Ambulatory Visit: Payer: Self-pay | Admitting: Internal Medicine

## 2019-01-05 DIAGNOSIS — M109 Gout, unspecified: Secondary | ICD-10-CM

## 2019-01-22 ENCOUNTER — Other Ambulatory Visit: Payer: Self-pay | Admitting: Internal Medicine

## 2019-01-29 ENCOUNTER — Other Ambulatory Visit: Payer: Self-pay | Admitting: Internal Medicine

## 2019-01-29 NOTE — Telephone Encounter (Signed)
Vitamin D, Ergocalciferol, (DRISDOL) 1.25 MG (50000 UT) CAPS capsule  Send to [facility]

## 2019-01-29 NOTE — Telephone Encounter (Signed)
Requested medication (s) are due for refill today: yes  Requested medication (s) are on the active medication list: yes  Last refill:  11/03/2018  Future visit scheduled: yes  Notes to clinic:  Review for refill   Requested Prescriptions  Pending Prescriptions Disp Refills   Vitamin D, Ergocalciferol, (DRISDOL) 1.25 MG (50000 UT) CAPS capsule 12 capsule 0    Sig: Take 1 capsule (50,000 Units total) by mouth every 7 (seven) days.     Endocrinology:  Vitamins - Vitamin D Supplementation Failed - 01/29/2019  2:27 PM      Failed - 50,000 IU strengths are not delegated      Failed - Phosphate in normal range and within 360 days    No results found for: PHOS       Failed - Vitamin D in normal range and within 360 days    VITD  Date Value Ref Range Status  11/01/2018 19.30 (L) 30.00 - 100.00 ng/mL Final         Passed - Ca in normal range and within 360 days    Calcium  Date Value Ref Range Status  11/01/2018 8.9 8.4 - 10.5 mg/dL Final         Passed - Valid encounter within last 12 months    Recent Outpatient Visits          3 months ago Uncontrolled type 2 diabetes mellitus with hyperglycemia [facility])   [facility] -Georges Mouse, MD   3 months ago Uncontrolled type 2 diabetes mellitus with hyperglycemia [facility])   [facility] -Georges Mouse, MD   8 months ago Neck pain   [place], Enid Baas, MD   9 months ago Neck pain   [place], Enid Baas, MD   9 months ago Neck pain   [facility] -Georges Mouse, MD      Future Appointments            In 3 months Jenny Reichmann, Hunt Oris, MD [place], [facility]

## 2019-01-30 NOTE — Telephone Encounter (Signed)
Pt needs to start OTC vit d 2000units daily. The prescription is only for the initial start of therapy.

## 2019-01-31 NOTE — Telephone Encounter (Signed)
Informed pt .

## 2019-03-31 ENCOUNTER — Other Ambulatory Visit: Payer: Self-pay | Admitting: Internal Medicine

## 2019-03-31 NOTE — Telephone Encounter (Signed)
[place] Controlled Database Checked Last filled: 01/29/19 # 60 LOV w/you:10/31/18 Next appt w/you: 05/08/19

## 2019-03-31 NOTE — Telephone Encounter (Signed)
Done erx 

## 2019-05-08 ENCOUNTER — Ambulatory Visit: Payer: Self-pay | Admitting: Internal Medicine

## 2019-05-17 ENCOUNTER — Other Ambulatory Visit: Payer: Self-pay | Admitting: Internal Medicine

## 2019-06-28 ENCOUNTER — Other Ambulatory Visit: Payer: Self-pay | Admitting: Family Medicine

## 2019-06-28 ENCOUNTER — Other Ambulatory Visit: Payer: Self-pay | Admitting: Internal Medicine

## 2019-06-28 DIAGNOSIS — I10 Essential (primary) hypertension: Secondary | ICD-10-CM

## 2019-06-28 NOTE — Telephone Encounter (Signed)
Please refill as per office routine med refill policy (all routine meds refilled for 3 mo or monthly per pt preference up to one year from last visit, then month to month grace period for 3 mo, then further med refills will have to be denied)  

## 2019-07-04 ENCOUNTER — Encounter: Payer: Self-pay | Admitting: Family Medicine

## 2019-07-04 ENCOUNTER — Ambulatory Visit (INDEPENDENT_AMBULATORY_CARE_PROVIDER_SITE_OTHER): Payer: Self-pay | Admitting: Family Medicine

## 2019-07-04 ENCOUNTER — Other Ambulatory Visit: Payer: Self-pay

## 2019-07-04 DIAGNOSIS — M542 Cervicalgia: Secondary | ICD-10-CM

## 2019-07-04 MED ORDER — GABAPENTIN 100 MG PO CAPS: 200.0000 mg | ORAL_CAPSULE | Freq: Every day | ORAL | 3 refills | Status: DC

## 2019-07-04 NOTE — Patient Instructions (Signed)
Good to see you pennsaid pinkie amount topically 2 times daily as needed.  Vitamin D 2000 IU daily  If pennsaid doesn't work see me for injection

## 2019-07-04 NOTE — Progress Notes (Signed)
[place] [address] [place] Phone: [phone] Subjective:   I Steven [name] am serving as a Education administrator for Dr. Hulan Saas.  This visit occurred during the SARS-CoV-2 public health emergency.  Safety protocols were in place, including screening questions prior to the visit, additional usage of staff PPE, and extensive cleaning of exam room while observing appropriate contact time as indicated for disinfecting solutions.   I'm seeing this patient by the request  of:  Steven Borg, MD  CC: Neck pain follow-up  [id number]  Byrant [name] is a 59 y.o. male coming in with complaint of neck pain. Patient is here for a gabapentin refill. Patient states he is doing well. Bilateral knee pain. Patient states he is on his knees all day every day.      Patient is known to the provider and was having neck pain that was not responding to conservative therapy and sent for an MRI in June 24, 2018.  Independently visualized by me.  Patient was found to have some upper thoracic scoliosis noted.  Severe bilateral foraminal narrowing at C3-C4 and C4-C5  Past Medical History:  Diagnosis Date  . Chicken pox   . Diabetes mellitus without complication ([place])   . Hypertension    Past Surgical History:  Procedure Laterality Date  . INGUINAL HERNIA REPAIR Right 12/19/2017   Procedure: HERNIA REPAIR INGUINAL RIGHT;  Surgeon: Coralie Keens, MD;  [facility]: [facility];  Service: General;  Laterality: Right;  . INSERTION OF MESH Right 12/19/2017   Procedure: INSERTION OF MESH;  Surgeon: Coralie Keens, MD;  Location: [facility];  Service: General;  Laterality: Right;  . MASS EXCISION Right 12/19/2017   Procedure: EXCISION OF RIGHT SCROTAL MASS;  Surgeon: Coralie Keens, MD;  Location: [facility];  Service: General;  Laterality: Right;  . TONSILLECTOMY     Social History   Socioeconomic History  . Marital status: Single    Spouse name: Not on file  .  Number of children: 1  . Years of education: 64  . Highest education level: Not on file  Occupational History  . Occupation: Architect  Tobacco Use  . Smoking status: Former Smoker    Packs/day: 2.00    Years: 25.00    Pack years: 50.00    Types: Cigarettes  . Smokeless tobacco: Former Systems developer    Types: Chew  Substance and Sexual Activity  . Alcohol use: Yes    Comment: occasionally  . Drug use: No  . Sexual activity: Yes    Birth control/protection: Condom  Other Topics Concern  . Not on file  Social History Narrative   Fun: Just working   Social Determinants of Radio broadcast assistant Strain:   . Difficulty of Paying Living Expenses: Not on file  Food Insecurity:   . Worried About Charity fundraiser in the Last Year: Not on file  . Ran Out of Food in the Last Year: Not on file  Transportation Needs:   . Lack of Transportation (Medical): Not on file  . Lack of Transportation (Non-Medical): Not on file  Physical Activity:   . Days of Exercise per Week: Not on file  . Minutes of Exercise per Session: Not on file  Stress:   . Feeling of Stress : Not on file  Social Connections:   . Frequency of Communication with Friends and Family: Not on file  . Frequency of Social Gatherings with Friends and Family: Not on file  .  Attends Religious Services: Not on file  . Active Member of Clubs or Organizations: Not on file  . Attends Archivist Meetings: Not on file  . Marital Status: Not on file   No Known Allergies Family History  Problem Relation Age of Onset  . Heart disease Mother   . Hypertension Mother   . Diabetes Mother   . Stroke Father   . Hypertension Father   . Stroke Brother     Current Outpatient Medications (Endocrine & Metabolic):  .  empagliflozin (JARDIANCE) 25 MG TABS tablet, Take 25 mg by mouth daily. Marland Kitchen  glipiZIDE (GLUCOTROL XL) 10 MG 24 hr tablet, Take 1 tablet (10 mg total) by mouth 2 (two) times a day. .  Insulin Glargine (LANTUS  SOLOSTAR) 100 UNIT/ML Solostar Pen, Inject 10 Units into the skin daily. .  metFORMIN (GLUCOPHAGE) 1000 MG tablet, TAKE 1 TABLET BY MOUTH TWICE A DAY WITH FOOD  Current Outpatient Medications (Cardiovascular):  .  hydrochlorothiazide (MICROZIDE) 12.5 MG capsule, Take 1 capsule (12.5 mg total) by mouth daily. Annual appt due in June must see provider for future refills .  lisinopril (ZESTRIL) 20 MG tablet, TAKE 1 TABLET BY MOUTH EVERY DAY .  lovastatin (MEVACOR) 40 MG tablet, TAKE 1 TABLET BY MOUTH EVERYDAY AT BEDTIME  Current Outpatient Medications (Respiratory):  .  triamcinolone (NASACORT) 55 MCG/ACT AERO nasal inhaler, Place 2 sprays into the nose daily.  Current Outpatient Medications (Analgesics):  .  acetaminophen (TYLENOL) 500 MG tablet, Take 1 tablet (500 mg total) by mouth every 6 (six) hours as needed. Marland Kitchen  allopurinol (ZYLOPRIM) 100 MG tablet, TAKE 1 TABLET BY MOUTH TWICE A DAY .  indomethacin (INDOCIN) 25 MG capsule, TAKE 1 CAPSULE (25 MG TOTAL) BY MOUTH 2 (TWO) TIMES DAILY AS NEEDED. Marland Kitchen  oxyCODONE (OXY IR/ROXICODONE) 5 MG immediate release tablet, Take 1 tablet (5 mg total) by mouth every 6 (six) hours as needed for moderate pain. .  traMADol (ULTRAM) 50 MG tablet, TAKE 1 TABLET (50 MG TOTAL) BY MOUTH EVERY 6 (SIX) HOURS AS NEEDED.  Current Outpatient Medications (Hematological):  .  iron polysaccharides (FERREX 150) 150 MG capsule, Take 1 capsule (150 mg total) by mouth daily. Annual appt due in June must see provider for future refills  Current Outpatient Medications (Other):  .  blood glucose meter kit and supplies KIT, Dispense based on patient and insurance preference. Use up to four times daily as directed. (FOR ICD-9 250.00, 250.01). .  Blood Glucose Monitoring Suppl (ONETOUCH VERIO FLEX SYSTEM) w/Device KIT, 1 kit by Does not apply route daily. Use as directed to check blood sugar daily Dx E11.9 .  cyclobenzaprine (FLEXERIL) 5 MG tablet, TAKE 1 TABLET BY MOUTH THREE TIMES A  DAY AS NEEDED FOR MUSCLE SPASMS .  Diclofenac Sodium (PENNSAID) 2 % SOLN, Place 1 application onto the skin 2 (two) times daily. Marland Kitchen  docusate sodium (COLACE) 100 MG capsule, Take 1 capsule (100 mg total) by mouth daily. Marland Kitchen  gabapentin (NEURONTIN) 100 MG capsule, Take 1 capsule (100 mg total) by mouth 3 (three) times daily. Marland Kitchen  glucose blood (ONETOUCH VERIO) test strip, 1 each by Other route 4 (four) times daily -  with meals and at bedtime. Use to check blood sugars four times a day Dx E11.9 .  ONETOUCH DELICA LANCETS 00B MISC, Use to help check blood sugars four times a day Dx e11.9 .  pantoprazole (PROTONIX) 40 MG tablet, Take 1 tablet (40 mg total) by mouth daily. Marland Kitchen  Vitamin D, Ergocalciferol, (DRISDOL) 1.25 MG (50000 UT) CAPS capsule, Take 1 capsule (50,000 Units total) by mouth every 7 (seven) days. Marland Kitchen  gabapentin (NEURONTIN) 100 MG capsule, Take 2 capsules (200 mg total) by mouth at bedtime.   Reviewed prior external information including notes and imaging from  primary care provider As well as notes that were available from care everywhere and other healthcare systems.  Past medical history, social, surgical and family history all reviewed in electronic medical record.  No pertanent information unless stated regarding to the chief complaint.   Review of Systems:  No headache, visual changes, nausea, vomiting, diarrhea, constipation, dizziness, abdominal pain, skin rash, fevers, chills, night sweats, weight loss, swollen lymph nodes, body aches, joint swelling, chest pain, shortness of breath, mood changes. POSITIVE muscle aches  Objective  Blood pressure 130/80, pulse 64, height '5\' 8"'  (1.727 m), weight 228 lb (103.4 kg), SpO2 98 %.   General: No apparent distress alert and oriented x3 mood and affect normal, dressed appropriately.  HEENT: Pupils equal, extraocular movements intact  Respiratory: Patient's speak in full sentences and does not appear short of breath  Cardiovascular: No  lower extremity edema, non tender, no erythema  Skin: Warm dry intact with no signs of infection or rash on extremities or on axial skeleton.  Abdomen: Soft nontender  Neuro: Cranial nerves II through XII are intact, neurovascularly intact in all extremities with 2+ DTRs and 2+ pulses.  Lymph: No lymphadenopathy of posterior or anterior cervical chain or axillae bilaterally.  Gait normal with good balance and coordination.  MSK: Arthritic changes of multiple joints Neck: Inspection loss of lordosis. No palpable stepoffs. Negative Spurling's maneuver. Limited range of motion in sidebending and extension with some mild crepitus Grip strength and sensation normal in bilateral hands Strength good C4 to T1 distribution No sensory change to C4 to T1 Negative Hoffman sign bilaterally Reflexes normal Tightness in the trapezius     Impression and Recommendations:     This case required medical decision making of moderate complexity. The above documentation has been reviewed and is accurate and complete Lyndal Pulley, DO       Note: This dictation was prepared with Dragon dictation along with smaller phrase technology. Any transcriptional errors that result from this process are unintentional.

## 2019-07-04 NOTE — Assessment & Plan Note (Signed)
Degenerative disc disease with some mild cervical radiculopathy.  Patient has responded well to the gabapentin.  Refill given again today.  Patient seems to be doing relatively well otherwise.  As long as patient continues to do well can follow-up with me as needed.  Discussed some of his other issues including arthritic changes and can come back at a later date if he would like further treatment

## 2019-09-11 ENCOUNTER — Other Ambulatory Visit: Payer: Self-pay | Admitting: Internal Medicine

## 2019-09-11 DIAGNOSIS — I10 Essential (primary) hypertension: Secondary | ICD-10-CM

## 2019-09-15 ENCOUNTER — Other Ambulatory Visit: Payer: Self-pay | Admitting: Internal Medicine

## 2019-09-15 DIAGNOSIS — I10 Essential (primary) hypertension: Secondary | ICD-10-CM

## 2019-09-15 NOTE — Telephone Encounter (Signed)
Please refill as per office routine med refill policy (all routine meds refilled for 3 mo or monthly per pt preference up to one year from last visit, then month to month grace period for 3 mo, then further med refills will have to be denied)  

## 2019-10-14 ENCOUNTER — Other Ambulatory Visit: Payer: Self-pay | Admitting: Internal Medicine

## 2019-10-14 DIAGNOSIS — E1165 Type 2 diabetes mellitus with hyperglycemia: Secondary | ICD-10-CM

## 2019-10-14 NOTE — Telephone Encounter (Signed)
Please refill as per office routine med refill policy (all routine meds refilled for 3 mo or monthly per pt preference up to one year from last visit, then month to month grace period for 3 mo, then further med refills will have to be denied)  

## 2019-10-30 ENCOUNTER — Other Ambulatory Visit: Payer: Self-pay | Admitting: Internal Medicine

## 2019-10-30 DIAGNOSIS — E1165 Type 2 diabetes mellitus with hyperglycemia: Secondary | ICD-10-CM

## 2019-11-06 ENCOUNTER — Ambulatory Visit (INDEPENDENT_AMBULATORY_CARE_PROVIDER_SITE_OTHER): Payer: Self-pay | Admitting: Internal Medicine

## 2019-11-06 ENCOUNTER — Other Ambulatory Visit: Payer: Self-pay

## 2019-11-06 ENCOUNTER — Encounter: Payer: Self-pay | Admitting: Internal Medicine

## 2019-11-06 VITALS — BP 118/60 | HR 65 | Temp 98.0°F | Ht 68.0 in | Wt 220.0 lb

## 2019-11-06 DIAGNOSIS — E559 Vitamin D deficiency, unspecified: Secondary | ICD-10-CM

## 2019-11-06 DIAGNOSIS — E1165 Type 2 diabetes mellitus with hyperglycemia: Secondary | ICD-10-CM

## 2019-11-06 DIAGNOSIS — D509 Iron deficiency anemia, unspecified: Secondary | ICD-10-CM

## 2019-11-06 DIAGNOSIS — D649 Anemia, unspecified: Secondary | ICD-10-CM

## 2019-11-06 DIAGNOSIS — I10 Essential (primary) hypertension: Secondary | ICD-10-CM

## 2019-11-06 DIAGNOSIS — Z125 Encounter for screening for malignant neoplasm of prostate: Secondary | ICD-10-CM

## 2019-11-06 LAB — TSH: TSH: 1.48 u[IU]/mL (ref 0.35–4.50)

## 2019-11-06 LAB — BASIC METABOLIC PANEL
BUN: 18 mg/dL (ref 6–23)
CO2: 25 mEq/L (ref 19–32)
Calcium: 9.3 mg/dL (ref 8.4–10.5)
Chloride: 98 mEq/L (ref 96–112)
Creatinine, Ser: 1.08 mg/dL (ref 0.40–1.50)
GFR: 84.01 mL/min (ref >60.00)
Glucose, Bld: 313 mg/dL — ABNORMAL HIGH (ref 70–99)
Potassium: 4.2 mEq/L (ref 3.5–5.1)
Sodium: 132 mEq/L — ABNORMAL LOW (ref 135–145)

## 2019-11-06 LAB — URINALYSIS, ROUTINE W REFLEX MICROSCOPIC
Bilirubin Urine: NEGATIVE
Hgb urine dipstick: NEGATIVE
Ketones, ur: NEGATIVE
Leukocytes,Ua: NEGATIVE
Nitrite: NEGATIVE
Specific Gravity, Urine: 1.025 (ref 1.000–1.030)
Total Protein, Urine: NEGATIVE
Urine Glucose: >=1000 — AB
Urobilinogen, UA: 0.2 (ref 0.0–1.0)
pH: 5.5 (ref 5.0–8.0)

## 2019-11-06 LAB — HEPATIC FUNCTION PANEL
ALT: 23 U/L (ref 0–53)
AST: 16 U/L (ref 0–37)
Albumin: 4.2 g/dL (ref 3.5–5.2)
Alkaline Phosphatase: 55 U/L (ref 39–117)
Bilirubin, Direct: 0.1 mg/dL (ref 0.0–0.3)
Total Bilirubin: 0.4 mg/dL (ref 0.2–1.2)
Total Protein: 6.4 g/dL (ref 6.0–8.3)

## 2019-11-06 LAB — FERRITIN: Ferritin: 82.2 ng/mL (ref 22.0–322.0)

## 2019-11-06 LAB — CBC WITH DIFFERENTIAL/PLATELET
Basophils Absolute: 0 10*3/uL (ref 0.0–0.1)
Basophils Relative: 0.6 % (ref 0.0–3.0)
Eosinophils Absolute: 0.2 10*3/uL (ref 0.0–0.7)
Eosinophils Relative: 2.3 % (ref 0.0–5.0)
HCT: 35.8 % — ABNORMAL LOW (ref 39.0–52.0)
Hemoglobin: 11.5 g/dL — ABNORMAL LOW (ref 13.0–17.0)
Lymphocytes Relative: 38.4 % (ref 12.0–46.0)
Lymphs Abs: 2.5 10*3/uL (ref 0.7–4.0)
MCHC: 32 g/dL (ref 30.0–36.0)
MCV: 78.1 fl (ref 78.0–100.0)
Monocytes Absolute: 0.5 10*3/uL (ref 0.1–1.0)
Monocytes Relative: 7.6 % (ref 3.0–12.0)
Neutro Abs: 3.3 10*3/uL (ref 1.4–7.7)
Neutrophils Relative %: 51.1 % (ref 43.0–77.0)
Platelets: 290 10*3/uL (ref 150.0–400.0)
RBC: 4.58 Mil/uL (ref 4.22–5.81)
RDW: 16.1 % — ABNORMAL HIGH (ref 11.5–15.5)
WBC: 6.5 10*3/uL (ref 4.0–10.5)

## 2019-11-06 LAB — LIPID PANEL
Cholesterol: 124 mg/dL (ref 0–200)
HDL: 32.8 mg/dL — ABNORMAL LOW (ref >39.00)
LDL Cholesterol: 58 mg/dL (ref 0–99)
NonHDL: 91.64
Total CHOL/HDL Ratio: 4
Triglycerides: 170 mg/dL — ABNORMAL HIGH (ref 0.0–149.0)
VLDL: 34 mg/dL (ref 0.0–40.0)

## 2019-11-06 LAB — MICROALBUMIN / CREATININE URINE RATIO
Creatinine,U: 86 mg/dL
Microalb Creat Ratio: 0.8 mg/g (ref 0.0–30.0)
Microalb, Ur: <0.7 mg/dL (ref 0.0–1.9)

## 2019-11-06 LAB — VITAMIN D 25 HYDROXY (VIT D DEFICIENCY, FRACTURES): VITD: 27.33 ng/mL — ABNORMAL LOW (ref 30.00–100.00)

## 2019-11-06 LAB — IBC PANEL
Iron: 46 ug/dL (ref 42–165)
Saturation Ratios: 11.4 % — ABNORMAL LOW (ref 20.0–50.0)
Transferrin: 287 mg/dL (ref 212.0–360.0)

## 2019-11-06 LAB — PSA: PSA: 0.52 ng/mL (ref 0.10–4.00)

## 2019-11-06 NOTE — Patient Instructions (Signed)

## 2019-11-06 NOTE — Progress Notes (Signed)
Subjective:    Patient ID: Steven [name], male    DOB: 01/06/1961, 59 y.o.   MRN: [id number]  HPI  Here to f/u; overall doing ok,  Pt denies chest pain, increasing sob or doe, wheezing, orthopnea, PND, increased LE swelling, palpitations, dizziness or syncope.  Pt denies new neurological symptoms such as new headache, or facial or extremity weakness or numbness.  Pt denies polydipsia, polyuria, or low sugar episode.  Pt states overall good compliance with meds, mostly trying to follow appropriate diet, with wt overall stable,  but little exercise however. Did not see Gi due to cost Past Medical History:  Diagnosis Date  . Chicken pox   . Diabetes mellitus without complication ([place])   . Hypertension    Past Surgical History:  Procedure Laterality Date  . INGUINAL HERNIA REPAIR Right 12/19/2017   Procedure: HERNIA REPAIR INGUINAL RIGHT;  Surgeon: Coralie Keens, MD;  Location: [facility];  Service: General;  Laterality: Right;  . INSERTION OF MESH Right 12/19/2017   Procedure: INSERTION OF MESH;  Surgeon: Coralie Keens, MD;  Location: [facility];  Service: General;  Laterality: Right;  . MASS EXCISION Right 12/19/2017   Procedure: EXCISION OF RIGHT SCROTAL MASS;  Surgeon: Coralie Keens, MD;  Location: [facility];  Service: General;  Laterality: Right;  . TONSILLECTOMY      reports that he has quit smoking. His smoking use included cigarettes. He has a 50.00 pack-year smoking history. He has quit using smokeless tobacco.  His smokeless tobacco use included chew. He reports current alcohol use. He reports that he does not use drugs. family history includes Diabetes in his mother; Heart disease in his mother; Hypertension in his father and mother; Stroke in his brother and father. No Known Allergies Current Outpatient Medications on File Prior to Visit  Medication Sig Dispense Refill  . acetaminophen (TYLENOL) 500 MG tablet Take 1 tablet (500 mg total) by mouth every 6 (six) hours as needed. 30  tablet 0  . blood glucose meter kit and supplies KIT Dispense based on patient and insurance preference. Use up to four times daily as directed. (FOR ICD-9 250.00, 250.01). 1 each 0  . Blood Glucose Monitoring Suppl ([place]) w/Device KIT 1 kit by Does not apply route daily. Use as directed to check blood sugar daily Dx E11.9 1 kit 0  . cyclobenzaprine (FLEXERIL) 5 MG tablet TAKE 1 TABLET BY MOUTH THREE TIMES A DAY AS NEEDED FOR MUSCLE SPASMS 30 tablet 5  . Diclofenac Sodium (PENNSAID) 2 % SOLN Place 1 application onto the skin 2 (two) times daily. 1 Bottle 3  . docusate sodium (COLACE) 100 MG capsule Take 1 capsule (100 mg total) by mouth daily. 10 capsule 0  . gabapentin (NEURONTIN) 100 MG capsule Take 1 capsule (100 mg total) by mouth 3 (three) times daily. 270 capsule 1  . gabapentin (NEURONTIN) 100 MG capsule Take 2 capsules (200 mg total) by mouth at bedtime. 180 capsule 3  . glipiZIDE (GLUCOTROL XL) 10 MG 24 hr tablet Take 1 tablet (10 mg total) by mouth 2 (two) times a day. 180 tablet 3  . glucose blood (ONETOUCH VERIO) test strip 1 each by Other route 4 (four) times daily -  with meals and at bedtime. Use to check blood sugars four times a day Dx E11.9 125 each 11  . indomethacin (INDOCIN) 25 MG capsule TAKE 1 CAPSULE (25 MG TOTAL) BY MOUTH 2 (TWO) TIMES DAILY AS NEEDED. 90 capsule 1  .  iron polysaccharides (FERREX 150) 150 MG capsule Take 1 capsule (150 mg total) by mouth daily. Annual appt due in June must see provider for future refills 90 capsule 1  . ONETOUCH DELICA LANCETS 09T MISC Use to help check blood sugars four times a day Dx e11.9 200 each 1  . oxyCODONE (OXY IR/ROXICODONE) 5 MG immediate release tablet Take 1 tablet (5 mg total) by mouth every 6 (six) hours as needed for moderate pain. 25 tablet 0  . traMADol (ULTRAM) 50 MG tablet TAKE 1 TABLET (50 MG TOTAL) BY MOUTH EVERY 6 (SIX) HOURS AS NEEDED. 60 tablet 2  . triamcinolone (NASACORT) 55 MCG/ACT AERO nasal  inhaler Place 2 sprays into the nose daily. 1 Inhaler 12   No current facility-administered medications on file prior to visit.   Review of Systems All otherwise neg per pt    Objective:   Physical Exam BP 118/60 (BP Location: Left Arm, Patient Position: Sitting, Cuff Size: Large)   Pulse 65   Temp 98 F (36.7 C) (Oral)   Ht _0  (1.727 m)   Wt 220 lb (99.8 kg)   SpO2 97%   BMI 33.45 kg/m  VS noted,  Constitutional: Pt appears in NAD HENT: Head: NCAT.  Right Ear: External ear normal.  Left Ear: External ear normal.  Eyes: . Pupils are equal, round, and reactive to light. Conjunctivae and EOM are normal Nose: without d/c or deformity Neck: Neck supple. Gross normal ROM Cardiovascular: Normal rate and regular rhythm.   Pulmonary/Chest: Effort normal and breath sounds without rales or wheezing.  Abd:  Soft, NT, ND, + BS, no organomegaly Neurological: Pt is alert. At baseline orientation, motor grossly intact Skin: Skin is warm. No rashes, other new lesions, no LE edema Psychiatric: Pt behavior is normal without agitation  All otherwise neg per pt Lab Results  Component Value Date   WBC 6.5 11/06/2019   HGB 11.5 (L) 11/06/2019   HCT 35.8 (L) 11/06/2019   PLT 290.0 11/06/2019   GLUCOSE 313 (H) 11/06/2019   CHOL 124 11/06/2019   TRIG 170.0 (H) 11/06/2019   HDL 32.80 (L) 11/06/2019   LDLCALC 58 11/06/2019   ALT 23 11/06/2019   AST 16 11/06/2019   NA 132 (L) 11/06/2019   K 4.2 11/06/2019   CL 98 11/06/2019   CREATININE 1.08 11/06/2019   BUN 18 11/06/2019   CO2 25 11/06/2019   TSH 1.48 11/06/2019   PSA 0.52 11/06/2019   HGBA1C 12.1 (H) 11/06/2019   MICROALBUR <0.7 11/06/2019      Assessment & Plan:

## 2019-11-07 LAB — HEMOGLOBIN A1C: Hgb A1c MFr Bld: 12.1 % — ABNORMAL HIGH (ref 4.6–6.5)

## 2019-11-08 ENCOUNTER — Encounter: Payer: Self-pay | Admitting: Internal Medicine

## 2019-11-08 ENCOUNTER — Other Ambulatory Visit: Payer: Self-pay | Admitting: Internal Medicine

## 2019-11-08 DIAGNOSIS — I10 Essential (primary) hypertension: Secondary | ICD-10-CM

## 2019-11-08 DIAGNOSIS — M109 Gout, unspecified: Secondary | ICD-10-CM

## 2019-11-08 DIAGNOSIS — E1165 Type 2 diabetes mellitus with hyperglycemia: Secondary | ICD-10-CM

## 2019-11-08 MED ORDER — METFORMIN HCL 1000 MG PO TABS: ORAL_TABLET | ORAL | 3 refills | Status: DC

## 2019-11-08 MED ORDER — ALLOPURINOL 100 MG PO TABS: 100.0000 mg | ORAL_TABLET | Freq: Two times a day (BID) | ORAL | 3 refills | Status: DC

## 2019-11-08 MED ORDER — HYDROCHLOROTHIAZIDE 12.5 MG PO CAPS: ORAL_CAPSULE | ORAL | 3 refills | Status: DC

## 2019-11-08 MED ORDER — LOVASTATIN 40 MG PO TABS: 40.0000 mg | ORAL_TABLET | Freq: Every day | ORAL | 3 refills | Status: DC

## 2019-11-08 MED ORDER — LISINOPRIL 20 MG PO TABS: ORAL_TABLET | ORAL | 3 refills | Status: DC

## 2019-11-08 MED ORDER — PANTOPRAZOLE SODIUM 40 MG PO TBEC: 40.0000 mg | DELAYED_RELEASE_TABLET | Freq: Every day | ORAL | 3 refills | Status: DC

## 2019-11-08 MED ORDER — LANTUS SOLOSTAR 100 UNIT/ML ~~LOC~~ SOPN
10.0000 [IU] | PEN_INJECTOR | Freq: Every day | SUBCUTANEOUS | 11 refills | Status: DC
Start: 2019-11-08 — End: 2019-12-26

## 2019-11-09 ENCOUNTER — Encounter: Payer: Self-pay | Admitting: Internal Medicine

## 2019-11-09 NOTE — Assessment & Plan Note (Signed)
stable overall by history and exam, recent data reviewed with pt, and pt to continue medical treatment as before,  to f/u any worsening symptoms or concerns  

## 2019-11-09 NOTE — Assessment & Plan Note (Signed)
For fu lab,  to f/u any worsening symptoms or concerns  

## 2019-11-09 NOTE — Assessment & Plan Note (Signed)
stable overall by history and exam, recent data reviewed with pt, and pt to continue medical treatment as before,  to f/u any worsening symptoms or concerns, for a1c, consider actos as pt refuses insulin

## 2019-11-09 NOTE — Assessment & Plan Note (Signed)
decliens gi f/u for now

## 2019-11-14 ENCOUNTER — Other Ambulatory Visit: Payer: Self-pay | Admitting: Internal Medicine

## 2019-11-14 NOTE — Telephone Encounter (Signed)
Please refill as per office routine med refill policy (all routine meds refilled for 3 mo or monthly per pt preference up to one year from last visit, then month to month grace period for 3 mo, then further med refills will have to be denied)  

## 2019-12-11 ENCOUNTER — Other Ambulatory Visit: Payer: Self-pay | Admitting: Internal Medicine

## 2019-12-11 DIAGNOSIS — E1165 Type 2 diabetes mellitus with hyperglycemia: Secondary | ICD-10-CM

## 2019-12-11 NOTE — Telephone Encounter (Signed)
Please refill as per office routine med refill policy (all routine meds refilled for 3 mo or monthly per pt preference up to one year from last visit, then month to month grace period for 3 mo, then further med refills will have to be denied)  

## 2019-12-26 ENCOUNTER — Ambulatory Visit (INDEPENDENT_AMBULATORY_CARE_PROVIDER_SITE_OTHER): Payer: Self-pay | Admitting: Endocrinology

## 2019-12-26 ENCOUNTER — Encounter: Payer: Self-pay | Admitting: Endocrinology

## 2019-12-26 ENCOUNTER — Other Ambulatory Visit: Payer: Self-pay

## 2019-12-26 VITALS — BP 130/78 | HR 58 | Ht 68.0 in | Wt 221.8 lb

## 2019-12-26 DIAGNOSIS — E1165 Type 2 diabetes mellitus with hyperglycemia: Secondary | ICD-10-CM

## 2019-12-26 LAB — POCT GLYCOSYLATED HEMOGLOBIN (HGB A1C): Hemoglobin A1C: 9.9 % — AB (ref 4.0–5.6)

## 2019-12-26 NOTE — Patient Instructions (Addendum)
good diet and exercise significantly improve the control of your diabetes.  please let me know if you wish to be referred to a dietician.  high blood sugar is very risky to your health.  you should see an eye doctor and dentist every year.  It is very important to get all recommended vaccinations.  Controlling your blood pressure and cholesterol drastically reduces the damage diabetes does to your body.  Those who smoke should quit.  Please discuss these with your doctor.  check your blood sugar once a day.  vary the time of day when you check, between before the 3 meals, and at bedtime.  also check if you have symptoms of your blood sugar being too high or too low.  please keep a record of the readings and bring it to your next appointment here (or you can bring the meter itself).  You can write it on any piece of paper.  please call us sooner if your blood sugar goes below 70, or if you have a lot of readings over 200. The cheapest meter and strips are "Relion" at [place].   Please see our diabetes educator, to go over checking your blood sugar, and drawing up the insulin.   Please take 10 units each morning.  Please come back for a follow-up appointment in 1 month.

## 2019-12-26 NOTE — Progress Notes (Signed)
Subjective:    Patient ID: Steven [name], male    DOB: [birth year], 59 y.o.   MRN: [id number]  HPI pt is referred by Dr Jenny Reichmann, for diabetes.  Pt states DM was dx'ed in 2010; he is unaware of any chronic complications  PN; he has never been on insulin; pt says his diet and exercise are fair; he has never had pancreatitis, pancreatic surgery, severe hypoglycemia or DKA.  He gets discount from [facility].  He does not have a refrigerator at home.   Past Medical History:  Diagnosis Date  . Chicken pox   . Diabetes mellitus without complication ([place])   . Hypertension     Past Surgical History:  Procedure Laterality Date  . INGUINAL HERNIA REPAIR Right 12/19/2017   Procedure: HERNIA REPAIR INGUINAL RIGHT;  Surgeon: Coralie Keens, MD;  [facility];  Service: General;  Laterality: Right;  . INSERTION OF MESH Right 12/19/2017   Procedure: INSERTION OF MESH;  Surgeon: Coralie Keens, MD;  [facility];  Service: General;  Laterality: Right;  . MASS EXCISION Right 12/19/2017   Procedure: EXCISION OF RIGHT SCROTAL MASS;  Surgeon: Coralie Keens, MD;  Location: [facility];  Service: General;  Laterality: Right;  . TONSILLECTOMY      Social History   Socioeconomic History  . Marital status: Single    Spouse name: Not on file  . Number of children: 1  . Years of education: 54  . Highest education level: Not on file  Occupational History  . Occupation: Architect  Tobacco Use  . Smoking status: Former Smoker    Packs/day: 2.00    Years: 25.00    Pack years: 50.00    Types: Cigarettes  . Smokeless tobacco: Former Systems developer    Types: Chew  Substance and Sexual Activity  . Alcohol use: Yes    Comment: occasionally  . Drug use: No  . Sexual activity: Yes    Birth control/protection: Condom  Other Topics Concern  . Not on file  Social History Narrative   Fun: Just working   Social Determinants of Radio broadcast assistant Strain:   . Difficulty of Paying Living Expenses:    Food Insecurity:   . Worried About Charity fundraiser in the Last Year:   . Arboriculturist in the Last Year:   Transportation Needs:   . Film/video editor (Medical):   Marland Kitchen Lack of Transportation (Non-Medical):   Physical Activity:   . Days of Exercise per Week:   . Minutes of Exercise per Session:   Stress:   . Feeling of Stress :   Social Connections:   . Frequency of Communication with Friends and Family:   . Frequency of Social Gatherings with Friends and Family:   . Attends Religious Services:   . Active Member of Clubs or Organizations:   . Attends Archivist Meetings:   Marland Kitchen Marital Status:   Intimate Partner Violence:   . Fear of Current or Ex-Partner:   . Emotionally Abused:   Marland Kitchen Physically Abused:   . Sexually Abused:     Current Outpatient Medications on File Prior to Visit  Medication Sig Dispense Refill  . allopurinol (ZYLOPRIM) 100 MG tablet Take 1 tablet (100 mg total) by mouth 2 (two) times daily. 180 tablet 3  . glipiZIDE (GLUCOTROL XL) 10 MG 24 hr tablet TAKE 1 TABLET BY MOUTH TWICE A DAY 180 tablet 3  . hydrochlorothiazide (MICROZIDE) 12.5 MG capsule TAKE 1  CAPSULE BY MOUTH DAILY. ANNUAL APPT DUE IN JUNE MUST SEE PROVIDER FOR FUTURE REFILLS (Patient taking differently: TAKE 1 CAPSULE BY MOUTH DAILY.) 90 capsule 3  . indomethacin (INDOCIN) 25 MG capsule TAKE 1 CAPSULE (25 MG TOTAL) BY MOUTH 2 (TWO) TIMES DAILY AS NEEDED. (Patient taking differently: Take 25 mg by mouth daily. ) 90 capsule 1  . lisinopril (ZESTRIL) 20 MG tablet 1 tab by mouth once daily 90 tablet 3  . lovastatin (MEVACOR) 40 MG tablet Take 1 tablet (40 mg total) by mouth at bedtime. 90 tablet 3  . metFORMIN (GLUCOPHAGE) 1000 MG tablet TAKE 1 TABLET BY MOUTH TWICE A DAY WITH FOOD 180 tablet 1  . pantoprazole (PROTONIX) 40 MG tablet Take 1 tablet (40 mg total) by mouth daily. (Patient taking differently: Take 40 mg by mouth as needed. ) 90 tablet 3   No current facility-administered  medications on file prior to visit.    No Known Allergies  Family History  Problem Relation Age of Onset  . Heart disease Mother   . Hypertension Mother   . Diabetes Mother   . Stroke Father   . Hypertension Father   . Stroke Brother     BP (!) 130/78   Pulse 58   Ht 5\' 8"  (1.727 m)   Wt (!) 221 lb 12.8 oz (100.6 kg)   SpO2 98%   BMI 33.72 kg/m   Review of Systems denies weight loss, blurry vision, chest pain, sob, n/v, urinary frequency, memory loss, and depression.      Objective:   Physical Exam VS: see vs page GEN: no distress HEAD: head: no deformity eyes: no periorbital swelling, no proptosis external nose and ears are normal NECK: supple, thyroid is not enlarged CHEST WALL: no deformity LUNGS: clear to auscultation CV: reg rate and rhythm, no murmur MUSCULOSKELETAL: muscle bulk and strength are grossly normal.  no obvious joint swelling.  gait is normal and steady EXTEMITIES: no deformity.  no ulcer on the feet.  feet are of normal color and temp.  Trace bilat leg edema PULSES: dorsalis pedis intact bilat.  no carotid bruit NEURO:  cn 2-12 grossly intact.   readily moves all 4's.  sensation is intact to touch on the feet, but decreased from normal SKIN:  Normal texture and temperature.  No rash or suspicious lesion is visible.   NODES:  None palpable at the neck PSYCH: alert, well-oriented.  Does not appear anxious nor depressed.  Lab Results  Component Value Date   HGBA1C 9.9 (A) 12/26/2019   I have reviewed outside records, and summarized: Pt was noted to have elevated A1c, and referred here.  Anemia and HTN were also addressed.      Assessment & Plan:  Type 2 DM: he needs increased rx SDOH: He does not have refrigerator for NPH insulin.  Next step is to see CDE.    Patient Instructions  good diet and exercise significantly improve the control of your diabetes.  please let me know if you wish to be referred to a dietician.  high blood sugar is very  risky to your health.  you should see an eye doctor and dentist every year.  It is very important to get all recommended vaccinations.  Controlling your blood pressure and cholesterol drastically reduces the damage diabetes does to your body.  Those who smoke should quit.  Please discuss these with your doctor.  check your blood sugar once a day.  vary the time of day  when you check, between before the 3 meals, and at bedtime.  also check if you have symptoms of your blood sugar being too high or too low.  please keep a record of the readings and bring it to your next appointment here (or you can bring the meter itself).  You can write it on any piece of paper.  please call us sooner if your blood sugar goes below 70, or if you have a lot of readings over 200. The cheapest meter and strips are "Relion" at [place].   Please see our diabetes educator, to go over checking your blood sugar, and drawing up the insulin.   Please take 10 units each morning.  Please come back for a follow-up appointment in 1 month.

## 2019-12-29 ENCOUNTER — Encounter: Payer: Self-pay | Admitting: Dietician

## 2019-12-29 ENCOUNTER — Encounter: Payer: Self-pay | Attending: Endocrinology | Admitting: Dietician

## 2019-12-29 ENCOUNTER — Other Ambulatory Visit: Payer: Self-pay

## 2019-12-29 DIAGNOSIS — E1165 Type 2 diabetes mellitus with hyperglycemia: Secondary | ICD-10-CM | POA: Insufficient documentation

## 2019-12-29 NOTE — Progress Notes (Addendum)
Diabetes Self-Management Education  Visit Type: First/Initial  Appt. Start Time: 9:45 Appt. End Time: 10:50  12/29/2019  [name], identified by name and date of birth, is a 59 y.o. male with a diagnosis of Diabetes: Type 2.   ASSESSMENT  Height 5\' 8"  (1.727 m), weight (!) 220 lb 14.4 oz (100.2 kg). Body mass index is 33.59 kg/m.   Pt is present for visit alone.  Pt expresses interest in how to check blood sugar/use his meter.   History includes Type 2 Diabetes, HTN Pt bought a ReliOn premier meter, which did not come with a lancing device or lancets.  Dietitian provided lancing device and lancets to last until pt can get his own. Educate pt on how to check blood sugar, and what values he should be seeing when he checks.  Pt purchased incorrect test strips and therefore unable to obtain blood sugar during this visit.  Discussed that he needs to speak with the pharmacist at [facility] to obtain strips that match his blood glucose meter.  Medications include:  Metformin and Glipizide.  Insulin has been avoided as patient does not have a refrigerator.  Confirmed with another CDE that NPH no longer needs refrigeration as long as it is stored at 87 degrees or less and is used within 28 days.  Social: Pt works in Architect.  He uses a crock pot occasionally but mostly eats out.  Meals are often high in fat.  He drinks regular soda.  He does not have a refrigerator.    Diabetes Self-Management Education - 12/29/19 0958      Visit Information   Visit Type First/Initial      Initial Visit   Diabetes Type Type 2    Are you currently following a meal plan? No    Are you taking your medications as prescribed? Yes    Date Diagnosed 10/14/2014      Health Coping   How would you rate your overall health? Fair      Psychosocial Assessment   Patient Belief/Attitude about Diabetes Motivated to manage diabetes    Self-care barriers Other (comment)   Concerns over keeping insulin  refrigerated. Will advise about guidelines   Self-management support CDE visits    Patient Concerns Monitoring;Medication;Nutrition/Meal planning    Special Needs None    Preferred Learning Style No preference indicated    Learning Readiness Contemplating    How often do you need to have someone help you when you read instructions, pamphlets, or other written materials from your doctor or pharmacy? 1 - Never    What is the last grade level you completed in school? high school graduate      Pre-Education Assessment   Patient understands the diabetes disease and treatment process. Needs Instruction    Patient understands incorporating nutritional management into lifestyle. Needs Instruction    Patient undertands incorporating physical activity into lifestyle. Needs Instruction    Patient understands using medications safely. Needs Instruction    Patient understands monitoring blood glucose, interpreting and using results Needs Instruction    Patient understands prevention, detection, and treatment of acute complications. Needs Instruction    Patient understands prevention, detection, and treatment of chronic complications. Needs Instruction    Patient understands how to develop strategies to address psychosocial issues. Needs Instruction    Patient understands how to develop strategies to promote health/change behavior. Needs Instruction      Complications   Last HgB A1C per patient/outside source 12.1 %  How often do you check your blood sugar? 0 times/day (not testing)   Pt arrived with new meter. Provided instruction on how to use meter   Fasting Blood glucose range (mg/dL) --   Pt not checking blood sugar   Postprandial Blood glucose range (mg/dL) --   Pt is not checking   Have you had a dilated eye exam in the past 12 months? No    Have you had a dental exam in the past 12 months? No    Are you checking your feet? No      Dietary Intake   Breakfast Steak with onions and gravy, with  mashed potatoes. Water. Breakfast sandwich with egg, bacon, on white bread. Coffee with sugar and cream    Lunch half chicken salad sandwich, cookie sandwich. Water. Fast food for lunch    Dinner Fried chicken, fried okra, greens, Pepsi 2 16 oz cups.    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 0    How many minutes per day do you exercise? 0    Total minutes per week of exercise 0      Patient Education   Previous Diabetes Education No    Disease state  Definition of diabetes, type 1 and 2, and the diagnosis of diabetes;Factors that contribute to the development of diabetes    Nutrition management  Carbohydrate counting;Information on hints to eating out and maintain blood glucose control.    Physical activity and exercise  Role of exercise on diabetes management, blood pressure control and cardiac health.    Medications Reviewed patients medication for diabetes, action, purpose, timing of dose and side effects.    Monitoring Taught/evaluated SMBG meter.;Purpose and frequency of SMBG.    Psychosocial adjustment Role of stress on diabetes;Identified and addressed patients feelings and concerns about diabetes;Worked with patient to identify barriers to care and solutions    Personal strategies to promote health Lifestyle issues that need to be addressed for better diabetes care      Individualized Goals (developed by patient)   Nutrition General guidelines for healthy choices and portions discussed    Physical Activity Not Applicable    Medications take my medication as prescribed    Monitoring  test my blood glucose as discussed    Reducing Risk examine blood glucose patterns      Post-Education Assessment   Patient understands the diabetes disease and treatment process. Needs Review    Patient understands incorporating nutritional management into  lifestyle. Needs Review    Patient undertands incorporating physical activity into lifestyle. Needs Review    Patient understands using medications safely. Needs Review    Patient understands monitoring blood glucose, interpreting and using results Needs Review    Patient understands prevention, detection, and treatment of acute complications. Needs Review    Patient understands prevention, detection, and treatment of chronic complications. Needs Review    Patient understands how to develop strategies to address psychosocial issues. Needs Review    Patient understands how to develop strategies to promote health/change behavior. Needs Review      Outcomes   Expected Outcomes Demonstrated interest in learning. Expect positive outcomes    Future DMSE 4-6 wks    Program Status Not Completed           Individualized Plan for Diabetes Self-Management Training:  Learning Objective:  Patient will have a greater understanding of diabetes self-management. Patient education plan is to attend individual and/or group sessions per assessed needs and concerns.   Plan:   Patient Instructions  Purchase ReliOn PREMIER test strips Purchase ReliOn lancing device and lancets Consider what foods are carbohydrates in your meal and remember to be consistent throughout the day! Rethink what you drink! Lower Pepsi consumption, starting at 1 cup a day instead of 2, or replace with an unsweetened tea.   Meal Plan:  Aim for 4 Carb Choices per meal (60 grams) +/- 1 either way  Aim for 0-2 Carbs per snack if hungry  Include protein in moderation with your meals and snacks Consider reading food labels for Total Carbohydrate of foods Consider  increasing your activity level by 15-20 minutes daily as tolerated Consider checking BG at alternate times per day      Expected Outcomes:  Demonstrated interest in learning. Expect positive outcomes  Education material provided: ADA - How to Thrive: A Guide for  Your Journey with Diabetes, Meal plan card and Snack sheet  If problems or questions, patient to contact team via:  Phone and Email  Future DSME appointment: 4-6 wks

## 2019-12-29 NOTE — Patient Instructions (Signed)
Purchase ReliOn PREMIER test strips Purchase ReliOn lancing device and lancets Consider what foods are carbohydrates in your meal and remember to be consistent throughout the day! Rethink what you drink! Lower Pepsi consumption, starting at 1 cup a day instead of 2, or replace with an unsweetened tea.   Meal Plan:  Aim for 4 Carb Choices per meal (60 grams) +/- 1 either way  Aim for 0-2 Carbs per snack if hungry  Include protein in moderation with your meals and snacks Consider reading food labels for Total Carbohydrate of foods Consider  increasing your activity level by 15-20 minutes daily as tolerated Consider checking BG at alternate times per day

## 2020-01-15 ENCOUNTER — Other Ambulatory Visit: Payer: Self-pay | Admitting: Internal Medicine

## 2020-01-15 DIAGNOSIS — M25471 Effusion, right ankle: Secondary | ICD-10-CM

## 2020-01-15 DIAGNOSIS — M109 Gout, unspecified: Secondary | ICD-10-CM

## 2020-01-27 ENCOUNTER — Encounter: Payer: Self-pay | Admitting: Endocrinology

## 2020-01-27 ENCOUNTER — Other Ambulatory Visit: Payer: Self-pay

## 2020-01-27 ENCOUNTER — Ambulatory Visit (INDEPENDENT_AMBULATORY_CARE_PROVIDER_SITE_OTHER): Payer: Self-pay | Admitting: Endocrinology

## 2020-01-27 DIAGNOSIS — E1142 Type 2 diabetes mellitus with diabetic polyneuropathy: Secondary | ICD-10-CM

## 2020-01-27 MED ORDER — INSULIN NPH (HUMAN) (ISOPHANE) 100 UNIT/ML ~~LOC~~ SUSP: 10.0000 [IU] | SUBCUTANEOUS | 11 refills | Status: DC

## 2020-01-27 NOTE — Patient Instructions (Addendum)
check your blood sugar once a day.  vary the time of day when you check, between before the 3 meals, and at bedtime.  also check if you have symptoms of your blood sugar being too high or too low.  please keep a record of the readings and bring it to your next appointment here (or you can bring the meter itself).  You can write it on any piece of paper.  please call us sooner if your blood sugar goes below 70, or if you have a lot of readings over 200. I have sent a prescription to [facility], for the insulin. Please continue the same other medications.   Please see our diabetes educator, to go over checking your blood sugar, and drawing up the insulin, next month as scheduled.    Please come back for a follow-up appointment in 1 month.

## 2020-01-27 NOTE — Progress Notes (Signed)
Subjective:    Patient ID: Steven [name], male    DOB: 07-25-1960, 59 y.o.   MRN: [id number]  HPI Pt returns for f/u of diabetes mellitus: DM type: 2 Dx'ed: 9937 Complications: PN Therapy: 2 oral meds DKA: never Severe hypoglycemia: never Pancreatitis: never Pancreatic imaging: never SDOH: he gets discount from [employer]; he is intermittently homeless Other: he has never been on insulin Interval history: He has obtained meter and strips, but does not know how to use.  He does not have insulin.  pt states he feels well in general. Past Medical History:  Diagnosis Date  . Chicken pox   . Diabetes mellitus without complication ([place])   . Hypertension     Past Surgical History:  Procedure Laterality Date  . INGUINAL HERNIA REPAIR Right 12/19/2017   Procedure: HERNIA REPAIR INGUINAL RIGHT;  Surgeon: Coralie Keens, MD;  Location: [facility];  Service: General;  Laterality: Right;  . INSERTION OF MESH Right 12/19/2017   Procedure: INSERTION OF MESH;  Surgeon: Coralie Keens, MD;  Location: [facility];  Service: General;  Laterality: Right;  . MASS EXCISION Right 12/19/2017   Procedure: EXCISION OF RIGHT SCROTAL MASS;  Surgeon: Coralie Keens, MD;  Location: [facility];  Service: General;  Laterality: Right;  . TONSILLECTOMY      Social History   Socioeconomic History  . Marital status: Single    Spouse name: Not on file  . Number of children: 1  . Years of education: 65  . Highest education level: Not on file  Occupational History  . Occupation: Architect  Tobacco Use  . Smoking status: Former Smoker    Packs/day: 2.00    Years: 25.00    Pack years: 50.00    Types: Cigarettes  . Smokeless tobacco: Former Systems developer    Types: Chew  Substance and Sexual Activity  . Alcohol use: Yes    Comment: occasionally  . Drug use: No  . Sexual activity: Yes    Birth control/protection: Condom  Other Topics Concern  . Not on file  Social History Narrative   Fun: Just working    Social Determinants of Radio broadcast assistant Strain:   . Difficulty of Paying Living Expenses: Not on file  Food Insecurity:   . Worried About Charity fundraiser in the Last Year: Not on file  . Ran Out of Food in the Last Year: Not on file  Transportation Needs:   . Lack of Transportation (Medical): Not on file  . Lack of Transportation (Non-Medical): Not on file  Physical Activity:   . Days of Exercise per Week: Not on file  . Minutes of Exercise per Session: Not on file  Stress:   . Feeling of Stress : Not on file  Social Connections:   . Frequency of Communication with Friends and Family: Not on file  . Frequency of Social Gatherings with Friends and Family: Not on file  . Attends Religious Services: Not on file  . Active Member of Clubs or Organizations: Not on file  . Attends Archivist Meetings: Not on file  . Marital Status: Not on file  Intimate Partner Violence:   . Fear of Current or Ex-Partner: Not on file  . Emotionally Abused: Not on file  . Physically Abused: Not on file  . Sexually Abused: Not on file    Current Outpatient Medications on File Prior to Visit  Medication Sig Dispense Refill  . allopurinol (ZYLOPRIM) 100 MG tablet Take 1  tablet (100 mg total) by mouth 2 (two) times daily. 180 tablet 3  . glipiZIDE (GLUCOTROL XL) 10 MG 24 hr tablet TAKE 1 TABLET BY MOUTH TWICE A DAY 180 tablet 3  . hydrochlorothiazide (MICROZIDE) 12.5 MG capsule TAKE 1 CAPSULE BY MOUTH DAILY. ANNUAL APPT DUE IN JUNE MUST SEE PROVIDER FOR FUTURE REFILLS (Patient taking differently: TAKE 1 CAPSULE BY MOUTH DAILY.) 90 capsule 3  . indomethacin (INDOCIN) 25 MG capsule TAKE 1 CAPSULE (25 MG TOTAL) BY MOUTH 2 (TWO) TIMES DAILY AS NEEDED. 90 capsule 1  . lisinopril (ZESTRIL) 20 MG tablet 1 tab by mouth once daily 90 tablet 3  . lovastatin (MEVACOR) 40 MG tablet Take 1 tablet (40 mg total) by mouth at bedtime. 90 tablet 3  . metFORMIN (GLUCOPHAGE) 1000 MG tablet TAKE 1  TABLET BY MOUTH TWICE A DAY WITH FOOD 180 tablet 1  . pantoprazole (PROTONIX) 40 MG tablet Take 1 tablet (40 mg total) by mouth daily. (Patient taking differently: Take 40 mg by mouth as needed. ) 90 tablet 3   No current facility-administered medications on file prior to visit.    No Known Allergies  Family History  Problem Relation Age of Onset  . Heart disease Mother   . Hypertension Mother   . Diabetes Mother   . Stroke Father   . Hypertension Father   . Stroke Brother     BP 120/78   Pulse 64   Ht 5\' 8"  (1.727 m)   Wt 224 lb 12.8 oz (102 kg)   SpO2 97%   BMI 34.18 kg/m    Review of Systems     Objective:   Physical Exam       Assessment & Plan:  Type 2 DM: uncontrolled SDOH: This limits rx of DM  Patient Instructions  check your blood sugar once a day.  vary the time of day when you check, between before the 3 meals, and at bedtime.  also check if you have symptoms of your blood sugar being too high or too low.  please keep a record of the readings and bring it to your next appointment here (or you can bring the meter itself).  You can write it on any piece of paper.  please call us sooner if your blood sugar goes below 70, or if you have a lot of readings over 200. I have sent a prescription to [facility], for the insulin. Please continue the same other medications.   Please see our diabetes educator, to go over checking your blood sugar, and drawing up the insulin, next month as scheduled.    Please come back for a follow-up appointment in 1 month.

## 2020-01-29 DIAGNOSIS — E119 Type 2 diabetes mellitus without complications: Secondary | ICD-10-CM | POA: Insufficient documentation

## 2020-02-09 ENCOUNTER — Other Ambulatory Visit: Payer: Self-pay

## 2020-02-09 ENCOUNTER — Encounter: Payer: Self-pay | Attending: Endocrinology | Admitting: Dietician

## 2020-02-09 ENCOUNTER — Encounter: Payer: Self-pay | Admitting: Dietician

## 2020-02-09 DIAGNOSIS — E1142 Type 2 diabetes mellitus with diabetic polyneuropathy: Secondary | ICD-10-CM

## 2020-02-09 DIAGNOSIS — E1165 Type 2 diabetes mellitus with hyperglycemia: Secondary | ICD-10-CM | POA: Insufficient documentation

## 2020-02-09 NOTE — Progress Notes (Signed)
Diabetes Self-Management Education  Visit Type: Follow-up  Appt. Start Time: 0950 Appt. End Time: 1030  02/09/2020  Steven [name], identified by name and date of birth, is a 59 y.o. male with a diagnosis of Diabetes: Type 2.   ASSESSMENT Patient is here today alone.  He was last seen 12/29/2019.  At that time we attempted to teach Blood Glucose monitoring but he did not have matching strips.   Today's goal is teaching blood glucose monitoring and insulin injection. Patient has brought all of his supplies.  History includes:  Type 2 Diabetes, HTN. Blood Glucose Meter:  Relion Premier Medications include Metformin, Glipizide, Novolin N 10 units q am (patient picked this ups today and has not given this).  Instructed on use of the Relion Blood glucose meter.  Blood glucose was 329 and patient states that he had not eaten yet. Insulin instruction was provided and patient demonstrated with insulin Novolin N which he just got from the pharmacy. Called and spoke with patient at 4 pm today.  He stated that he felt well but was unable to check his blood glucose at this time.  He was able to verbalize his insulin dose.  Social:  Patient works in Architect.  He uses a crock pot occasionally but mostly eats out.  Meals are often high in fat.  He drinks regular soda.  He does not have a refrigerator. Insulin no longer needs to be refrigerated as long as stored at 87 degrees or less and is used within 31 days.  He states that he does not have air conditioning as well.    Diabetes Self-Management Education - 02/09/20 1900      Visit Information   Visit Type Follow-up      Initial Visit   Diabetes Type Type 2    Are you taking your medications as prescribed? Yes      Psychosocial Assessment   Patient Belief/Attitude about Diabetes Motivated to manage diabetes    Self-care barriers Lack of material resources    Self-management support Doctor's office    Other persons present Patient     Patient Concerns Problem Solving;Monitoring;Glycemic Control    Special Needs Simplified materials    Preferred Learning Style No preference indicated    Learning Readiness Ready      Pre-Education Assessment   Patient understands the diabetes disease and treatment process. Demonstrates understanding / competency    Patient understands incorporating nutritional management into lifestyle. Needs Review    Patient undertands incorporating physical activity into lifestyle. Demonstrates understanding / competency    Patient understands using medications safely. Needs Review    Patient understands monitoring blood glucose, interpreting and using results Needs Review    Patient understands prevention, detection, and treatment of acute complications. Needs Review    Patient understands prevention, detection, and treatment of chronic complications. Demonstrates understanding / competency    Patient understands how to develop strategies to address psychosocial issues. Demonstrates understanding / competency    Patient understands how to develop strategies to promote health/change behavior. Needs Review      Complications   Last HgB A1C per patient/outside source 9.9 %   12/26/2019 decreased from 12.1% 11/06/2019   How often do you check your blood sugar? 0 times/day (not testing)      Patient Education   Previous Diabetes Education Yes (please comment)   12/2019   Medications Taught/reviewed insulin injection, site rotation, insulin storage and needle disposal.    Monitoring Taught/evaluated SMBG meter.;Taught/discussed recording  of test results and interpretation of SMBG.;Identified appropriate SMBG and/or A1C goals.    Acute complications Taught treatment of hypoglycemia - the 15 rule.      Individualized Goals (developed by patient)   Medications take my medication as prescribed    Monitoring  test my blood glucose as discussed    Health Coping discuss diabetes with (comment)   MD, RD, CDCES      Post-Education Assessment   Patient understands the diabetes disease and treatment process. Demonstrates understanding / competency    Patient understands incorporating nutritional management into lifestyle. Needs Review    Patient undertands incorporating physical activity into lifestyle. Needs Review    Patient understands using medications safely. Needs Review    Patient understands monitoring blood glucose, interpreting and using results Needs Review    Patient understands prevention, detection, and treatment of acute complications. Needs Review    Patient understands prevention, detection, and treatment of chronic complications. Demonstrates understanding / competency    Patient understands how to develop strategies to address psychosocial issues. Demonstrates understanding / competency    Patient understands how to develop strategies to promote health/change behavior. Needs Review      Outcomes   Expected Outcomes Other (comment)   demonstrates interest in learning but will need continued support   Future DMSE 2 wks    Program Status Not Completed      Subsequent Visit   Since your last visit have you continued or begun to take your medications as prescribed? Yes           Individualized Plan for Diabetes Self-Management Training:   Learning Objective:  Patient will have a greater understanding of diabetes self-management. Patient education plan is to attend individual and/or group sessions per assessed needs and concerns.   Plan:   Patient Instructions  Check your blood sugar each each morning  Goal - 80-130 in the morning before breakfast   Less than 180 2 hours after starting a meal If you feel shaky, sweat/clammy, confused or otherwise unwell, check your blood sugar  If it is less than 70 drink 1/2 cup regular soda or juice  Take your insulin each am   10 units Novolin N each am   Expected Outcomes:  Other (comment) (demonstrates interest in learning but will need  continued support)  Education material provided: Types of Insulin, Insulin injection using a vial  If problems or questions, patient to contact team via:  Phone  Future DSME appointment: 2 wks

## 2020-02-09 NOTE — Patient Instructions (Signed)
Check your blood sugar each each morning  Goal - 80-130 in the morning before breakfast   Less than 180 2 hours after starting a meal If you feel shaky, sweat/clammy, confused or otherwise unwell, check your blood sugar  If it is less than 70 drink 1/2 cup regular soda or juice  Take your insulin each am   10 units Novolin N each am

## 2020-02-12 ENCOUNTER — Telehealth: Payer: Self-pay | Admitting: Dietician

## 2020-02-12 NOTE — Telephone Encounter (Signed)
Patient called and left a message on our machine stating that his blood glucose was 123. This is WNL and decreased from mid 300's during his visit 02/09/2020.  Antonieta Iba, RD, LDN, CDCES

## 2020-02-13 ENCOUNTER — Telehealth: Payer: Self-pay | Admitting: Dietician

## 2020-02-13 NOTE — Telephone Encounter (Signed)
Please increase to 20 units qam.  Thank you.

## 2020-02-13 NOTE — Telephone Encounter (Signed)
Patient called and instructed to increase his Novolin N to 20 units each morning. We reviewed signs and symptoms of low blood sugar as well as treatment. Advised patient to call if he is continuing to have uncontrolled highs or frequent low blood glucose readings. Patient verbalized.  Thank you, Mickel Baas

## 2020-02-26 ENCOUNTER — Ambulatory Visit: Payer: Self-pay | Admitting: Dietician

## 2020-03-01 ENCOUNTER — Ambulatory Visit (INDEPENDENT_AMBULATORY_CARE_PROVIDER_SITE_OTHER): Payer: Self-pay | Admitting: Endocrinology

## 2020-03-01 ENCOUNTER — Encounter: Payer: Self-pay | Admitting: Endocrinology

## 2020-03-01 ENCOUNTER — Encounter: Payer: Self-pay | Attending: Endocrinology | Admitting: Dietician

## 2020-03-01 ENCOUNTER — Encounter: Payer: Self-pay | Admitting: Dietician

## 2020-03-01 ENCOUNTER — Other Ambulatory Visit: Payer: Self-pay

## 2020-03-01 VITALS — BP 112/68 | HR 61 | Ht 68.0 in | Wt 228.0 lb

## 2020-03-01 DIAGNOSIS — E1142 Type 2 diabetes mellitus with diabetic polyneuropathy: Secondary | ICD-10-CM

## 2020-03-01 LAB — POCT GLYCOSYLATED HEMOGLOBIN (HGB A1C): Hemoglobin A1C: 9.4 % — AB (ref 4.0–5.6)

## 2020-03-01 MED ORDER — INSULIN NPH (HUMAN) (ISOPHANE) 100 UNIT/ML ~~LOC~~ SUSP: 25.0000 [IU] | SUBCUTANEOUS | 11 refills | Status: DC

## 2020-03-01 NOTE — Progress Notes (Signed)
Diabetes Self-Management Education  Visit Type: Follow-up  Appt. Start Time: 0908 Appt. End Time: 7035  03/01/2020  [name], identified by name and date of birth, is a 59 y.o. male with a diagnosis of Diabetes: Type 2.   ASSESSMENT Patient is here today alone.  He was last seen 02/09/2020.   He was seen by Dr. Loanne Drilling this am.  He is to increase the Novolin N to 25 units each am. Follow up today related to diabetes type 2. Patient asks how he got diabetes.  Discussed that being over 85, over weight and family history are all risk factors for diabetes. Weight 228 lbs today Blood glucose 78-400 Reports that he feels better than before insulin. Symptoms of a low at 78.  He states that this was near lunch time. Drinks 12-16 oz regular coke each evening.  He states that his Blood Sugar is high 200's after this.  History includes:  Type 2 Diabetes, HTN, vitamin D deficiency Blood Glucose Meter:  Relion Premier Medications include Metformin, Glipizide, Novolin N 20 units q am (patient picked this ups today and has not given this). Last A1C 9.4% 03/01/2020 decreased from 9.9% 12/26/2019  Social hx:  Intermittently homeless.  He does not want information of free meals in the community.  He receives discounts from [facility].   Diabetes Self-Management Education - 03/01/20 0900      Visit Information   Visit Type Follow-up      Initial Visit   Diabetes Type Type 2    Are you taking your medications as prescribed? Yes      Pre-Education Assessment   Patient understands the diabetes disease and treatment process. Needs Review    Patient understands incorporating nutritional management into lifestyle. Needs Review    Patient undertands incorporating physical activity into lifestyle. Demonstrates understanding / competency    Patient understands using medications safely. Demonstrates understanding / competency    Patient understands monitoring blood glucose, interpreting and  using results Demonstrates understanding / competency    Patient understands prevention, detection, and treatment of acute complications. Demonstrates understanding / competency    Patient understands prevention, detection, and treatment of chronic complications. Demonstrates understanding / competency    Patient understands how to develop strategies to address psychosocial issues. Demonstrates understanding / competency    Patient understands how to develop strategies to promote health/change behavior. Needs Review      Complications   Last HgB A1C per patient/outside source 9.4 %   03/01/2020   How often do you check your blood sugar? 1-2 times/day    Fasting Blood glucose range (mg/dL) 70-129;130-179;180-200    Postprandial Blood glucose range (mg/dL) >200    Number of hypoglycemic episodes per month 1    Can you tell when your blood sugar is low? Yes    What do you do if your blood sugar is low? fast acting sugar    Number of hyperglycemic episodes per week 7    Can you tell when your blood sugar is high? No      Dietary Intake   Breakfast salad or leftovers    Lunch chicken and dumplings or steak and rice    Dinner fried chicken    Beverage(s) water, regular, coffee with sugar and creamer coke      Exercise   Exercise Type Light (walking / raking leaves)    How many days per week to you exercise? 7    How many minutes per day do you  exercise? 60    Total minutes per week of exercise 420      Patient Education   Previous Diabetes Education Yes (please comment)    Disease state  Factors that contribute to the development of diabetes    Nutrition management  Meal options for control of blood glucose level and chronic complications.    Physical activity and exercise  Role of exercise on diabetes management, blood pressure control and cardiac health.    Medications Reviewed patients medication for diabetes, action, purpose, timing of dose and side effects.    Acute complications  Taught treatment of hypoglycemia - the 15 rule.      Individualized Goals (developed by patient)   Nutrition General guidelines for healthy choices and portions discussed    Physical Activity Exercise 5-7 days per week;30 minutes per day    Medications take my medication as prescribed    Monitoring  test my blood glucose as discussed    Health Coping discuss diabetes with (comment)   MD, RD, CDCES     Patient Self-Evaluation of Goals - Patient rates self as meeting previously set goals (% of time)   Nutrition 50 - 75 %    Physical Activity >75%    Medications >75%    Monitoring >75%    Problem Solving >75%    Reducing Risk >75%    Health Coping >75%      Post-Education Assessment   Patient understands the diabetes disease and treatment process. Demonstrates understanding / competency    Patient understands incorporating nutritional management into lifestyle. Needs Review    Patient undertands incorporating physical activity into lifestyle. Demonstrates understanding / competency    Patient understands using medications safely. Demonstrates understanding / competency    Patient understands monitoring blood glucose, interpreting and using results Demonstrates understanding / competency    Patient understands prevention, detection, and treatment of acute complications. Demonstrates understanding / competency    Patient understands prevention, detection, and treatment of chronic complications. Demonstrates understanding / competency    Patient understands how to develop strategies to address psychosocial issues. Demonstrates understanding / competency    Patient understands how to develop strategies to promote health/change behavior. Demonstrates understanding / competency      Outcomes   Expected Outcomes Other (comment)    Future DMSE 4-6 wks    Program Status Not Completed      Subsequent Visit   Since your last visit have you continued or begun to take your medications as  prescribed? Yes           Individualized Plan for Diabetes Self-Management Training:   Learning Objective:  Patient will have a greater understanding of diabetes self-management. Patient education plan is to attend individual and/or group sessions per assessed needs and concerns.   Plan:   Patient Instructions  Stay active.  Walking is a good choice. Low fat choices when you are able. Check your feet daily. Continue to take your medication Rotate your insulin injection sites. Try to eat lunch at a consistent time to help avoid low blood sugar. Treat low blood sugar with 1/2 cup juice or regular soda.     Expected Outcomes:  Other (comment)  Education material provided:   If problems or questions, patient to contact team via:  Phone  Future DSME appointment: 4-6 wks

## 2020-03-01 NOTE — Patient Instructions (Signed)
Stay active.  Walking is a good choice. Low fat choices when you are able. Check your feet daily. Continue to take your medication Rotate your insulin injection sites. Try to eat lunch at a consistent time to help avoid low blood sugar. Treat low blood sugar with 1/2 cup juice or regular soda.

## 2020-03-01 NOTE — Progress Notes (Signed)
Subjective:    Patient ID: Steven [name], male    DOB: 12-07-1960, 59 y.o.   MRN: [id number]  HPI Pt returns for f/u of diabetes mellitus: DM type: Insulin-requiring type 2 Dx'ed: 6010 Complications: PN Therapy: 2 oral meds.   DKA: never Severe hypoglycemia: never Pancreatitis: never Pancreatic imaging: never SDOH: he gets discount from [place]; he is intermittently homeless.  Interval history: no cbg record, but states cbg's vary from 78-400.  pt states he feels well in general.   Past Medical History:  Diagnosis Date  . Chicken pox   . Diabetes mellitus without complication ([place])   . Hypertension     Past Surgical History:  Procedure Laterality Date  . INGUINAL HERNIA REPAIR Right 12/19/2017   Procedure: HERNIA REPAIR INGUINAL RIGHT;  Surgeon: Coralie Keens, MD;  Location: [facility];  Service: General;  Laterality: Right;  . INSERTION OF MESH Right 12/19/2017   Procedure: INSERTION OF MESH;  Surgeon: Coralie Keens, MD;  Location: [facility];  Service: General;  Laterality: Right;  . MASS EXCISION Right 12/19/2017   Procedure: EXCISION OF RIGHT SCROTAL MASS;  Surgeon: Coralie Keens, MD;  Location: [facility];  Service: General;  Laterality: Right;  . TONSILLECTOMY      Social History   Socioeconomic History  . Marital status: Single    Spouse name: Not on file  . Number of children: 1  . Years of education: 50  . Highest education level: Not on file  Occupational History  . Occupation: Architect  Tobacco Use  . Smoking status: Former Smoker    Packs/day: 2.00    Years: 25.00    Pack years: 50.00    Types: Cigarettes  . Smokeless tobacco: Former Systems developer    Types: Chew  Substance and Sexual Activity  . Alcohol use: Yes    Comment: occasionally  . Drug use: No  . Sexual activity: Yes    Birth control/protection: Condom  Other Topics Concern  . Not on file  Social History Narrative   Fun: Just working   Social Determinants of Systems developer Strain:   . Difficulty of Paying Living Expenses: Not on file  Food Insecurity:   . Worried About Charity fundraiser in the Last Year: Not on file  . Ran Out of Food in the Last Year: Not on file  Transportation Needs:   . Lack of Transportation (Medical): Not on file  . Lack of Transportation (Non-Medical): Not on file  Physical Activity:   . Days of Exercise per Week: Not on file  . Minutes of Exercise per Session: Not on file  Stress:   . Feeling of Stress : Not on file  Social Connections:   . Frequency of Communication with Friends and Family: Not on file  . Frequency of Social Gatherings with Friends and Family: Not on file  . Attends Religious Services: Not on file  . Active Member of Clubs or Organizations: Not on file  . Attends Archivist Meetings: Not on file  . Marital Status: Not on file  Intimate Partner Violence:   . Fear of Current or Ex-Partner: Not on file  . Emotionally Abused: Not on file  . Physically Abused: Not on file  . Sexually Abused: Not on file    Current Outpatient Medications on File Prior to Visit  Medication Sig Dispense Refill  . allopurinol (ZYLOPRIM) 100 MG tablet Take 1 tablet (100 mg total) by mouth 2 (two) times daily.  180 tablet 3  . glipiZIDE (GLUCOTROL XL) 10 MG 24 hr tablet TAKE 1 TABLET BY MOUTH TWICE A DAY 180 tablet 3  . hydrochlorothiazide (MICROZIDE) 12.5 MG capsule TAKE 1 CAPSULE BY MOUTH DAILY. ANNUAL APPT DUE IN JUNE MUST SEE PROVIDER FOR FUTURE REFILLS (Patient taking differently: TAKE 1 CAPSULE BY MOUTH DAILY.) 90 capsule 3  . indomethacin (INDOCIN) 25 MG capsule TAKE 1 CAPSULE (25 MG TOTAL) BY MOUTH 2 (TWO) TIMES DAILY AS NEEDED. 90 capsule 1  . lisinopril (ZESTRIL) 20 MG tablet 1 tab by mouth once daily 90 tablet 3  . lovastatin (MEVACOR) 40 MG tablet Take 1 tablet (40 mg total) by mouth at bedtime. 90 tablet 3  . metFORMIN (GLUCOPHAGE) 1000 MG tablet TAKE 1 TABLET BY MOUTH TWICE A DAY WITH FOOD 180 tablet 1   . pantoprazole (PROTONIX) 40 MG tablet Take 1 tablet (40 mg total) by mouth daily. (Patient taking differently: Take 40 mg by mouth as needed. ) 90 tablet 3   No current facility-administered medications on file prior to visit.    No Known Allergies  Family History  Problem Relation Age of Onset  . Heart disease Mother   . Hypertension Mother   . Diabetes Mother   . Stroke Father   . Hypertension Father   . Stroke Brother     BP 112/68   Pulse 61   Ht 5\' 8"  (1.727 m)   Wt 228 lb (103.4 kg)   SpO2 96%   BMI 34.67 kg/m    Review of Systems     Objective:   Physical Exam VITAL SIGNS:  See vs page GENERAL: no distress Pulses: dorsalis pedis intact bilat.   MSK: no deformity of the feet CV: no leg edema Skin:  no ulcer on the feet.  normal color and temp on the feet.   Neuro: sensation is intact to touch on the feet.    Lab Results  Component Value Date   HGBA1C 9.4 (A) 03/01/2020   Lab Results  Component Value Date   CREATININE 1.08 11/06/2019   BUN 18 11/06/2019   NA 132 (L) 11/06/2019   K 4.2 11/06/2019   CL 98 11/06/2019   CO2 25 11/06/2019      Assessment & Plan:  Insulin-requiring type 2 DM, with PN: uncontrolled.    Patient Instructions  check your blood sugar once a day.  vary the time of day when you check, between before the 3 meals, and at bedtime.  also check if you have symptoms of your blood sugar being too high or too low.  please keep a record of the readings and bring it to your next appointment here (or you can bring the meter itself).  You can write it on any piece of paper.  please call us sooner if your blood sugar goes below 70, or if you have a lot of readings over 200. Please increase the insulin to 25 units each morning.  On this type of insulin schedule, you should eat meals on a regular schedule (especially lunch).  If a meal is missed or significantly delayed, your blood sugar could go low.    Please continue the same other  medications.    Please come back for a follow-up appointment in 6 weeks.

## 2020-03-01 NOTE — Patient Instructions (Addendum)
check your blood sugar once a day.  vary the time of day when you check, between before the 3 meals, and at bedtime.  also check if you have symptoms of your blood sugar being too high or too low.  please keep a record of the readings and bring it to your next appointment here (or you can bring the meter itself).  You can write it on any piece of paper.  please call us sooner if your blood sugar goes below 70, or if you have a lot of readings over 200. Please increase the insulin to 25 units each morning.  On this type of insulin schedule, you should eat meals on a regular schedule (especially lunch).  If a meal is missed or significantly delayed, your blood sugar could go low.    Please continue the same other medications.    Please come back for a follow-up appointment in 6 weeks.

## 2020-03-11 ENCOUNTER — Other Ambulatory Visit: Payer: Self-pay | Admitting: Internal Medicine

## 2020-03-11 DIAGNOSIS — M25471 Effusion, right ankle: Secondary | ICD-10-CM

## 2020-03-11 DIAGNOSIS — M109 Gout, unspecified: Secondary | ICD-10-CM

## 2020-03-11 DIAGNOSIS — M25571 Pain in right ankle and joints of right foot: Secondary | ICD-10-CM

## 2020-03-16 ENCOUNTER — Other Ambulatory Visit: Payer: Self-pay | Admitting: Internal Medicine

## 2020-03-16 DIAGNOSIS — M25571 Pain in right ankle and joints of right foot: Secondary | ICD-10-CM

## 2020-03-16 DIAGNOSIS — M25471 Effusion, right ankle: Secondary | ICD-10-CM

## 2020-03-16 DIAGNOSIS — M109 Gout, unspecified: Secondary | ICD-10-CM

## 2020-03-29 ENCOUNTER — Other Ambulatory Visit: Payer: Self-pay | Admitting: Internal Medicine

## 2020-03-29 NOTE — Progress Notes (Signed)
[facility] [address] [place] Phone: [phone] Subjective:   I Kandace Blitz am serving as a Education administrator for Dr. Hulan Saas.  This visit occurred during the SARS-CoV-2 public health emergency.  Safety protocols were in place, including screening questions prior to the visit, additional usage of staff PPE, and extensive cleaning of exam room while observing appropriate contact time as indicated for disinfecting solutions.   I'm seeing this patient by the request  of:  [name], MD  CC: Left shoulder and neck pain  VZC:[id number]   07/04/2019 Degenerative disc disease with some mild cervical radiculopathy.  Patient has responded well to the gabapentin.  Refill given again today.  Patient seems to be doing relatively well otherwise.  As long as patient continues to do well can follow-up with me as needed.  Discussed some of his other issues including arthritic changes and can come back at a later date if he would like further treatment  Update 03/29/2020 Kanai Berrios is a 59 y.o. male coming in with complaint of shoulder pain. Patient states the pain starting to radiate into the left side of his neck. Pain starts at the anterior shoulder. States his hand tingles on the left side since it has gotten colder.  Patient has had trouble with this previously.  No longer taking the gabapentin with the neck.  Is having some mild radiation going down to the hands.  Patient denies any weakness.  Patient does do a lot of painting and manual labor and notices it seems to be worse when he is doing a lot of overhead activity.     Past Medical History:  Diagnosis Date  . Chicken pox   . Diabetes mellitus without complication ([place])   . Hypertension    Past Surgical History:  Procedure Laterality Date  . INGUINAL HERNIA REPAIR Right 12/19/2017   Procedure: HERNIA REPAIR INGUINAL RIGHT;  Surgeon: Coralie Keens, MD;  [facility];  Service:  General;  Laterality: Right;  . INSERTION OF MESH Right 12/19/2017   Procedure: INSERTION OF MESH;  Surgeon: Coralie Keens, MD;  [facility];  Service: General;  Laterality: Right;  . MASS EXCISION Right 12/19/2017   Procedure: EXCISION OF RIGHT SCROTAL MASS;  Surgeon: Coralie Keens, MD;  [facility];  Service: General;  Laterality: Right;  . TONSILLECTOMY     Social History   Socioeconomic History  . Marital status: Single    Spouse name: Not on file  . Number of children: 1  . Years of education: 52  . Highest education level: Not on file  Occupational History  . Occupation: Architect  Tobacco Use  . Smoking status: Former Smoker    Packs/day: 2.00    Years: 25.00    Pack years: 50.00    Types: Cigarettes  . Smokeless tobacco: Former Systems developer    Types: Chew  Substance and Sexual Activity  . Alcohol use: Yes    Comment: occasionally  . Drug use: No  . Sexual activity: Yes    Birth control/protection: Condom  Other Topics Concern  . Not on file  Social History Narrative   Fun: Just working   Social Determinants of Radio broadcast assistant Strain:   . Difficulty of Paying Living Expenses: Not on file  Food Insecurity:   . Worried About Charity fundraiser in the Last Year: Not on file  . Ran Out of Food in the Last Year: Not on  file  Transportation Needs:   . Film/video editor (Medical): Not on file  . Lack of Transportation (Non-Medical): Not on file  Physical Activity:   . Days of Exercise per Week: Not on file  . Minutes of Exercise per Session: Not on file  Stress:   . Feeling of Stress : Not on file  Social Connections:   . Frequency of Communication with Friends and Family: Not on file  . Frequency of Social Gatherings with Friends and Family: Not on file  . Attends Religious Services: Not on file  . Active Member of Clubs or Organizations: Not on file  . Attends Archivist Meetings: Not on file  . Marital Status: Not on  file   No Known Allergies Family History  Problem Relation Age of Onset  . Heart disease Mother   . Hypertension Mother   . Diabetes Mother   . Stroke Father   . Hypertension Father   . Stroke Brother     Current Outpatient Medications (Endocrine & Metabolic):  .  glipiZIDE (GLUCOTROL XL) 10 MG 24 hr tablet, TAKE 1 TABLET BY MOUTH TWICE A DAY .  insulin NPH Human (NOVOLIN N RELION) 100 UNIT/ML injection, Inject 0.25 mLs (25 Units total) into the skin every morning. And syringes 1/day .  metFORMIN (GLUCOPHAGE) 1000 MG tablet, TAKE 1 TABLET BY MOUTH TWICE A DAY WITH FOOD  Current Outpatient Medications (Cardiovascular):  .  hydrochlorothiazide (MICROZIDE) 12.5 MG capsule, TAKE 1 CAPSULE BY MOUTH DAILY. ANNUAL APPT DUE IN JUNE MUST SEE PROVIDER FOR FUTURE REFILLS (Patient taking differently: TAKE 1 CAPSULE BY MOUTH DAILY.) .  lisinopril (ZESTRIL) 20 MG tablet, 1 tab by mouth once daily .  lovastatin (MEVACOR) 40 MG tablet, Take 1 tablet (40 mg total) by mouth at bedtime.   Current Outpatient Medications (Analgesics):  .  allopurinol (ZYLOPRIM) 100 MG tablet, Take 1 tablet (100 mg total) by mouth 2 (two) times daily. .  indomethacin (INDOCIN) 25 MG capsule, TAKE 1 CAPSULE BY MOUTH TWICE A DAY AS NEEDED   Current Outpatient Medications (Other):  .  pantoprazole (PROTONIX) 40 MG tablet, Take 1 tablet (40 mg total) by mouth daily. (Patient taking differently: Take 40 mg by mouth as needed. ) .  gabapentin (NEURONTIN) 100 MG capsule, Take 2 capsules (200 mg total) by mouth at bedtime. .  Vitamin D, Ergocalciferol, (DRISDOL) 1.25 MG (50000 UNIT) CAPS capsule, Take 1 capsule (50,000 Units total) by mouth every 7 (seven) days.   Reviewed prior external information including notes and imaging from  primary care provider As well as notes that were available from care everywhere and other healthcare systems.  Past medical history, social, surgical and family history all reviewed in  electronic medical record.  No pertanent information unless stated regarding to the chief complaint.   Review of Systems:  No headache, visual changes, nausea, vomiting, diarrhea, constipation, dizziness, abdominal pain, skin rash, fevers, chills, night sweats, weight loss, swollen lymph nodes, body aches, joint swelling, chest pain, shortness of breath, mood changes. POSITIVE muscle aches  Objective  Blood pressure 122/82, pulse (!) 59, height 5\' 8"  (1.727 m), weight 231 lb (104.8 kg), SpO2 99 %.   General: No apparent distress alert and oriented x3 mood and affect normal, dressed appropriately.  HEENT: Pupils equal, extraocular movements intact  Respiratory: Patient's speak in full sentences and does not appear short of breath  Cardiovascular: No lower extremity edema, non tender, no erythema  Mild antalgic gait Neck exam does  have loss of lordosis.  Patient does have mild positive Spurling's with radicular symptoms in the C6-C7 distribution.  No significant weakness in the hand.  Positive impingement of the left shoulder with Neer and Hawkins.  Rotator cuff strength appears to be intact but patient does have some mild voluntary guarding.  Mild positive crossover noted as well with the shoulder.  [id number]; 15 additional minutes spent for Therapeutic exercises as stated in above notes.  This included exercises focusing on stretching, strengthening, with significant focus on eccentric aspects.   Long term goals include an improvement in range of motion, strength, endurance as well as avoiding reinjury. Patient's frequency would include in 1-2 times a day, 3-5 times a week for a duration of 6-12 weeks. Shoulder Exercises that included:  Basic scapular stabilization to include adduction and depression of scapula Scaption, focusing on proper movement and good control Internal and External rotation utilizing a theraband, with elbow tucked at side entire time Rows with theraband   Proper technique shown  and discussed handout in great detail with ATC.  All questions were discussed and answered.      Impression and Recommendations:     The above documentation has been reviewed and is accurate and complete Lyndal Pulley, DO

## 2020-03-29 NOTE — Telephone Encounter (Signed)
lease refill as per office routine med refill policy (all routine meds refilled for 3 mo or monthly per pt preference up to one year from last visit, then month to month grace period for 3 mo, then further med refills will have to be denied)

## 2020-03-30 ENCOUNTER — Other Ambulatory Visit: Payer: Self-pay

## 2020-03-30 ENCOUNTER — Encounter: Payer: Self-pay | Admitting: Family Medicine

## 2020-03-30 ENCOUNTER — Ambulatory Visit (INDEPENDENT_AMBULATORY_CARE_PROVIDER_SITE_OTHER): Payer: Self-pay | Admitting: Family Medicine

## 2020-03-30 ENCOUNTER — Ambulatory Visit (INDEPENDENT_AMBULATORY_CARE_PROVIDER_SITE_OTHER): Payer: Self-pay

## 2020-03-30 VITALS — BP 122/82 | HR 59 | Ht 68.0 in | Wt 231.0 lb

## 2020-03-30 DIAGNOSIS — G8929 Other chronic pain: Secondary | ICD-10-CM

## 2020-03-30 DIAGNOSIS — M25512 Pain in left shoulder: Secondary | ICD-10-CM

## 2020-03-30 DIAGNOSIS — M7552 Bursitis of left shoulder: Secondary | ICD-10-CM | POA: Insufficient documentation

## 2020-03-30 DIAGNOSIS — M542 Cervicalgia: Secondary | ICD-10-CM

## 2020-03-30 MED ORDER — VITAMIN D (ERGOCALCIFEROL) 1.25 MG (50000 UNIT) PO CAPS: 50000.0000 [IU] | ORAL_CAPSULE | ORAL | 0 refills | Status: DC

## 2020-03-30 MED ORDER — GABAPENTIN 100 MG PO CAPS: 200.0000 mg | ORAL_CAPSULE | Freq: Every day | ORAL | 3 refills | Status: DC

## 2020-03-30 NOTE — Assessment & Plan Note (Signed)
Known degenerative disc disease.  Likely contributing to some of it.  We will start the gabapentin again.  Start the once weekly vitamin D with a goal be beneficial.  Discussed icing regimen and home exercise.  Discussed over-the-counter topical anti-inflammatories.  If patient continues to have shoulder pain I would like to consider the possibility of injection.

## 2020-03-30 NOTE — Patient Instructions (Addendum)
Good to see you Vitamin D Refilled gabapentin Left shoulder xray today Keep hands within peripherial vision Shoulder/neck exercises See me again in 4-6 weeks

## 2020-03-30 NOTE — Assessment & Plan Note (Signed)
Appears to be shoulder bursitis.  Underlying gout could be contributing as well.  X-rays pending to rule out any arthritic changes that could also be contributing.  Topical anti-inflammatories and home exercises work with Product/process development scientist.  Cervical radiculopathy is likely restart the gabapentin.  Follow-up again 6 weeks if not better consider injection

## 2020-04-03 ENCOUNTER — Other Ambulatory Visit: Payer: Self-pay | Admitting: Internal Medicine

## 2020-04-12 ENCOUNTER — Encounter: Payer: Self-pay | Attending: Endocrinology | Admitting: Dietician

## 2020-04-12 ENCOUNTER — Other Ambulatory Visit: Payer: Self-pay

## 2020-04-12 ENCOUNTER — Ambulatory Visit (INDEPENDENT_AMBULATORY_CARE_PROVIDER_SITE_OTHER): Payer: Self-pay | Admitting: Endocrinology

## 2020-04-12 ENCOUNTER — Encounter: Payer: Self-pay | Admitting: Endocrinology

## 2020-04-12 VITALS — BP 130/88 | HR 57 | Ht 68.0 in | Wt 232.4 lb

## 2020-04-12 DIAGNOSIS — E1142 Type 2 diabetes mellitus with diabetic polyneuropathy: Secondary | ICD-10-CM

## 2020-04-12 NOTE — Patient Instructions (Addendum)
Stay active.  Walking is a good choice. Low fat choices when you are able. Avoid drinking beverages such as juice, regular soda, and other beverages with sugar unless treating a low. Check your feet daily. Continue to take your medication Rotate your insulin injection sites. Try to eat lunch at a consistent time to help avoid low blood sugar. Treat low blood sugar with 1/2 cup juice or regular soda.

## 2020-04-12 NOTE — Progress Notes (Signed)
Subjective:    Patient ID: Steven [name], male    DOB: 23-Nov-1960, 60 y.o.   MRN: [id number]  HPI Pt returns for f/u of diabetes mellitus: DM type: Insulin-requiring type 2 Dx'ed: 8250 Complications: PN Therapy: 2 oral meds.   DKA: never Severe hypoglycemia: never Pancreatitis: never Pancreatic imaging: never SDOH: he gets discount from [place]; he is intermittently homeless Other: he declines multiple daily injections Interval history: no cbg record, but states cbg's vary from 144-400.  There is no trend throughout the day.  He takes 20 units qam.  He seldom misses the insulin.  pt states he feels well in general.   Past Medical History:  Diagnosis Date  . Chicken pox   . Diabetes mellitus without complication ([place])   . Hypertension     Past Surgical History:  Procedure Laterality Date  . INGUINAL HERNIA REPAIR Right 12/19/2017   Procedure: HERNIA REPAIR INGUINAL RIGHT;  Surgeon: Coralie Keens, MD;  [facility];  Service: General;  Laterality: Right;  . INSERTION OF MESH Right 12/19/2017   Procedure: INSERTION OF MESH;  Surgeon: Coralie Keens, MD;  [facility];  Service: General;  Laterality: Right;  . MASS EXCISION Right 12/19/2017   Procedure: EXCISION OF RIGHT SCROTAL MASS;  Surgeon: Coralie Keens, MD;  [facility];  Service: General;  Laterality: Right;  . TONSILLECTOMY      Social History   Socioeconomic History  . Marital status: Single    Spouse name: Not on file  . Number of children: 1  . Years of education: 46  . Highest education level: Not on file  Occupational History  . Occupation: Architect  Tobacco Use  . Smoking status: Former Smoker    Packs/day: 2.00    Years: 25.00    Pack years: 50.00    Types: Cigarettes  . Smokeless tobacco: Former Systems developer    Types: Chew  Substance and Sexual Activity  . Alcohol use: Yes    Comment: occasionally  . Drug use: No  . Sexual activity: Yes    Birth control/protection: Condom   Other Topics Concern  . Not on file  Social History Narrative   Fun: Just working   Social Determinants of Radio broadcast assistant Strain:   . Difficulty of Paying Living Expenses: Not on file  Food Insecurity:   . Worried About Charity fundraiser in the Last Year: Not on file  . Ran Out of Food in the Last Year: Not on file  Transportation Needs:   . Lack of Transportation (Medical): Not on file  . Lack of Transportation (Non-Medical): Not on file  Physical Activity:   . Days of Exercise per Week: Not on file  . Minutes of Exercise per Session: Not on file  Stress:   . Feeling of Stress : Not on file  Social Connections:   . Frequency of Communication with Friends and Family: Not on file  . Frequency of Social Gatherings with Friends and Family: Not on file  . Attends Religious Services: Not on file  . Active Member of Clubs or Organizations: Not on file  . Attends Archivist Meetings: Not on file  . Marital Status: Not on file  Intimate Partner Violence:   . Fear of Current or Ex-Partner: Not on file  . Emotionally Abused: Not on file  . Physically Abused: Not on file  . Sexually Abused: Not on file    Current Outpatient Medications on File Prior to  Visit  Medication Sig Dispense Refill  . allopurinol (ZYLOPRIM) 100 MG tablet Take 1 tablet (100 mg total) by mouth 2 (two) times daily. 180 tablet 3  . gabapentin (NEURONTIN) 100 MG capsule Take 2 capsules (200 mg total) by mouth at bedtime. 180 capsule 3  . glipiZIDE (GLUCOTROL XL) 10 MG 24 hr tablet TAKE 1 TABLET BY MOUTH TWICE A DAY 180 tablet 3  . hydrochlorothiazide (MICROZIDE) 12.5 MG capsule TAKE 1 CAPSULE BY MOUTH DAILY. ANNUAL APPT DUE IN JUNE MUST SEE PROVIDER FOR FUTURE REFILLS (Patient taking differently: TAKE 1 CAPSULE BY MOUTH DAILY.) 90 capsule 3  . indomethacin (INDOCIN) 25 MG capsule TAKE 1 CAPSULE BY MOUTH TWICE A DAY AS NEEDED 180 capsule 1  . insulin NPH Human (NOVOLIN N RELION) 100 UNIT/ML  injection Inject 0.25 mLs (25 Units total) into the skin every morning. And syringes 1/day 10 mL 11  . lisinopril (ZESTRIL) 20 MG tablet 1 tab by mouth once daily 90 tablet 3  . lovastatin (MEVACOR) 40 MG tablet TAKE 1 TABLET BY MOUTH AT BEDTIME **ANNUAL APPT DUE IN JUNE,MUST SEE PROVIDER FOR REFILLS* 90 tablet 3  . metFORMIN (GLUCOPHAGE) 1000 MG tablet TAKE 1 TABLET BY MOUTH TWICE A DAY WITH FOOD 180 tablet 1  . pantoprazole (PROTONIX) 40 MG tablet Take 1 tablet (40 mg total) by mouth daily. (Patient taking differently: Take 40 mg by mouth as needed. ) 90 tablet 3  . Vitamin D, Ergocalciferol, (DRISDOL) 1.25 MG (50000 UNIT) CAPS capsule Take 1 capsule (50,000 Units total) by mouth every 7 (seven) days. 12 capsule 0   No current facility-administered medications on file prior to visit.    No Known Allergies  Family History  Problem Relation Age of Onset  . Heart disease Mother   . Hypertension Mother   . Diabetes Mother   . Stroke Father   . Hypertension Father   . Stroke Brother     BP 130/88   Pulse (!) 57   Ht 5\' 8"  (1.727 m)   Wt 232 lb 6.4 oz (105.4 kg)   SpO2 95%   BMI 35.34 kg/m    Review of Systems He denies hypoglycemia.      Objective:   Physical Exam VITAL SIGNS:  See vs page GENERAL: no distress Pulses: dorsalis pedis intact bilat.   MSK: no deformity of the feet CV: 1+ bilat leg edema Skin:  no ulcer on the feet.  normal color and temp on the feet. Neuro: sensation is intact to touch on the feet.    Lab Results  Component Value Date   HGBA1C 9.4 (A) 03/01/2020       Assessment & Plan:  Insulin-requiring type 2 DM, with PN: uncontrolled.   Patient Instructions  check your blood sugar once a day.  vary the time of day when you check, between before the 3 meals, and at bedtime.  also check if you have symptoms of your blood sugar being too high or too low.  please keep a record of the readings and bring it to your next appointment here (or you can bring  the meter itself).  You can write it on any piece of paper.  please call us sooner if your blood sugar goes below 70, or if you have a lot of readings over 200. Please increase the insulin to 25 units each morning.  On this type of insulin schedule, you should eat meals on a regular schedule (especially lunch).  If a meal is  missed or significantly delayed, your blood sugar could go low.    Please continue the same other medications.    Please come back for a follow-up appointment in 2 months.

## 2020-04-12 NOTE — Progress Notes (Signed)
Diabetes Self-Management Education  Visit Type:  Follow-up  Appt. Start Time: 0830 Appt. End Time: 0900  04/12/2020  [name], identified by name and date of birth, is a 59 y.o. male with a diagnosis of Diabetes: Type 2.    ASSESSMENT Patient is here today alone.  He just finished an appointment with Dr. Loanne Drilling and has been told to increase the Novolin N to 25 units each am.   Blood Glucose remains elevated and is monitoring (144-200 per MD note).  He was told to increase his insulin at his 02/2020 visit but did not. He states that he is still feeling better than before starting insulin. Weight increased from 228 lbs 02/2020 to 232 lbs today but with heavier clothing.  History includes: Type 2 Diabetes, HTN, vitamin D deficiency Blood Glucose Meter: Relion Premier Medications include Metformin, Glipizide, Novolin N 20 units q am (patient picked this ups today and has not given this). Last A1C 9.4% 03/01/2020 decreased from 9.9% 12/26/2019  Social hx:  Intermittently homeless.  He does not want information of free meals in the community.  He receives discounts from [facility]. There were no vitals taken for this visit. There is no height or weight on file to calculate BMI.    Diabetes Self-Management Education - 04/12/20 0900      Pre-Education Assessment   Patient understands the diabetes disease and treatment process. Demonstrates understanding / competency    Patient understands incorporating nutritional management into lifestyle. Needs Review    Patient undertands incorporating physical activity into lifestyle. Demonstrates understanding / competency    Patient understands using medications safely. Demonstrates understanding / competency    Patient understands monitoring blood glucose, interpreting and using results Demonstrates understanding / competency    Patient understands prevention, detection, and treatment of acute complications. Demonstrates understanding /  competency    Patient understands prevention, detection, and treatment of chronic complications. Demonstrates understanding / competency    Patient understands how to develop strategies to address psychosocial issues. Demonstrates understanding / competency    Patient understands how to develop strategies to promote health/change behavior. Needs Review      Complications   How often do you check your blood sugar? 1-2 times/day    Fasting Blood glucose range (mg/dL) 130-179    Postprandial Blood glucose range (mg/dL) >200    Number of hypoglycemic episodes per month 0    Can you tell when your blood sugar is low? Yes    What do you do if your blood sugar is low? fast acting carb      Dietary Intake   Breakfast baked chicken    Dinner country style steak, lima beans, greens      Exercise   Exercise Type Light (walking / raking leaves)    How many days per week to you exercise? 7    How many minutes per day do you exercise? 60    Total minutes per week of exercise 420      Patient Education   Previous Diabetes Education Yes (please comment)    Nutrition management  Meal options for control of blood glucose level and chronic complications.    Physical activity and exercise  Role of exercise on diabetes management, blood pressure control and cardiac health.    Acute complications Taught treatment of hypoglycemia - the 15 rule.      Individualized Goals (developed by patient)   Nutrition General guidelines for healthy choices and portions discussed    Physical  Activity Exercise 5-7 days per week;60 minutes per day    Monitoring  test my blood glucose as discussed      Patient Self-Evaluation of Goals - Patient rates self as meeting previously set goals (% of time)   Nutrition >75%    Physical Activity >75%    Medications >75%    Monitoring >75%    Problem Solving >75%    Reducing Risk >75%    Health Coping >75%      Post-Education Assessment   Patient understands the diabetes  disease and treatment process. Demonstrates understanding / competency    Patient understands incorporating nutritional management into lifestyle. Needs Review    Patient undertands incorporating physical activity into lifestyle. Demonstrates understanding / competency    Patient understands using medications safely. Demonstrates understanding / competency    Patient understands monitoring blood glucose, interpreting and using results Demonstrates understanding / competency    Patient understands prevention, detection, and treatment of acute complications. Demonstrates understanding / competency    Patient understands prevention, detection, and treatment of chronic complications. Demonstrates understanding / competency    Patient understands how to develop strategies to address psychosocial issues. Demonstrates understanding / competency    Patient understands how to develop strategies to promote health/change behavior. Demonstrates understanding / competency      Outcomes   Program Status Not Completed      Subsequent Visit   Since your last visit have you continued or begun to take your medications as prescribed? Yes           Learning Objective:  Patient will have a greater understanding of diabetes self-management. Patient education plan is to attend individual and/or group sessions per assessed needs and concerns.   Plan:   Patient Instructions  Stay active.  Walking is a good choice. Low fat choices when you are able. Avoid drinking beverages such as juice, regular soda, and other beverages with sugar unless treating a low. Check your feet daily. Continue to take your medication Rotate your insulin injection sites. Try to eat lunch at a consistent time to help avoid low blood sugar. Treat low blood sugar with 1/2 cup juice or regular soda.    Expected Outcomes:  Other (comment)  Education material provided:   If problems or questions, patient to contact team via:   Phone  Future DSME appointment: - 3-4 months

## 2020-04-12 NOTE — Patient Instructions (Addendum)
check your blood sugar once a day.  vary the time of day when you check, between before the 3 meals, and at bedtime.  also check if you have symptoms of your blood sugar being too high or too low.  please keep a record of the readings and bring it to your next appointment here (or you can bring the meter itself).  You can write it on any piece of paper.  please call us sooner if your blood sugar goes below 70, or if you have a lot of readings over 200. Please increase the insulin to 25 units each morning.  On this type of insulin schedule, you should eat meals on a regular schedule (especially lunch).  If a meal is missed or significantly delayed, your blood sugar could go low.    Please continue the same other medications.    Please come back for a follow-up appointment in 2 months.

## 2020-05-05 NOTE — Progress Notes (Signed)
[place] Phone: [phone] Subjective:   [name], am serving as a scribe for Dr. Hulan Saas. This visit occurred during the SARS-CoV-2 public health emergency.  Safety protocols were in place, including screening questions prior to the visit, additional usage of staff PPE, and extensive cleaning of exam room while observing appropriate contact time as indicated for disinfecting solutions.   I'm seeing this patient by the request  of:  [name], MD  CC: Left shoulder pain follow-up, new knee pain and wrist pain  LKG:[id number]   03/30/2020 Appears to be shoulder bursitis.  Underlying gout could be contributing as well.  X-rays pending to rule out any arthritic changes that could also be contributing.  Topical anti-inflammatories and home exercises work with Product/process development scientist.  Cervical radiculopathy is likely restart the gabapentin.  Follow-up again 6 weeks if not better consider injection  Known degenerative disc disease.  Likely contributing to some of it.  We will start the gabapentin again.  Start the once weekly vitamin D with a goal be beneficial.  Discussed icing regimen and home exercise.  Discussed over-the-counter topical anti-inflammatories.  If patient continues to have shoulder pain I would like to consider the possibility of injection.  Update 05/06/2020 Steven [name] is a 59 y.o. male coming in with complaint of left shoulder and neck pain. Patient states that his pain has decreased since last visit. Feels that gabapentin has helped.   Complaining of left knee pain when going down to his knees. Pain in anterior aspect. Problem is intermittent.  Patient states that it never feels like it is going to completely give out on him.  Usually only when he has the pressure.  Does notice audible clicking with going up and down stairs sometimes.  Does not remember any new injury recently.  Some mild  numbness in his fingertips in the mornings wants to know if it secondary to his sleeping position.  Seems to be the thumb and index finger mostly he states     Past Medical History:  Diagnosis Date  . Chicken pox   . Diabetes mellitus without complication ([place])   . Hypertension    Past Surgical History:  Procedure Laterality Date  . INGUINAL HERNIA REPAIR Right 12/19/2017   Procedure: HERNIA REPAIR INGUINAL RIGHT;  Surgeon: Coralie Keens, MD;  Location: [facility];  Service: General;  Laterality: Right;  . INSERTION OF MESH Right 12/19/2017   Procedure: INSERTION OF MESH;  Surgeon: Coralie Keens, MD;  Location: [facility];  Service: General;  Laterality: Right;  . MASS EXCISION Right 12/19/2017   Procedure: EXCISION OF RIGHT SCROTAL MASS;  Surgeon: Coralie Keens, MD;  Location: [facility];  Service: General;  Laterality: Right;  . TONSILLECTOMY     Social History   Socioeconomic History  . Marital status: Single    Spouse name: Not on file  . Number of children: 1  . Years of education: 14  . Highest education level: Not on file  Occupational History  . Occupation: Architect  Tobacco Use  . Smoking status: Former Smoker    Packs/day: 2.00    Years: 25.00    Pack years: 50.00    Types: Cigarettes  . Smokeless tobacco: Former Systems developer    Types: Chew  Substance and Sexual Activity  . Alcohol use: Yes    Comment: occasionally  . Drug use: No  . Sexual activity: Yes    Birth control/protection:  Condom  Other Topics Concern  . Not on file  Social History Narrative   Fun: Just working   Social Determinants of Radio broadcast assistant Strain: Not on file  Food Insecurity: Not on file  Transportation Needs: Not on file  Physical Activity: Not on file  Stress: Not on file  Social Connections: Not on file   No Known Allergies Family History  Problem Relation Age of Onset  . Heart disease Mother   . Hypertension Mother   . Diabetes Mother   . Stroke Father   .  Hypertension Father   . Stroke Brother     Current Outpatient Medications (Endocrine & Metabolic):  .  glipiZIDE (GLUCOTROL XL) 10 MG 24 hr tablet, TAKE 1 TABLET BY MOUTH TWICE A DAY .  insulin NPH Human (NOVOLIN N RELION) 100 UNIT/ML injection, Inject 0.25 mLs (25 Units total) into the skin every morning. And syringes 1/day .  metFORMIN (GLUCOPHAGE) 1000 MG tablet, TAKE 1 TABLET BY MOUTH TWICE A DAY WITH FOOD  Current Outpatient Medications (Cardiovascular):  .  hydrochlorothiazide (MICROZIDE) 12.5 MG capsule, TAKE 1 CAPSULE BY MOUTH DAILY. ANNUAL APPT DUE IN JUNE MUST SEE PROVIDER FOR FUTURE REFILLS (Patient taking differently: TAKE 1 CAPSULE BY MOUTH DAILY.) .  lisinopril (ZESTRIL) 20 MG tablet, 1 tab by mouth once daily .  lovastatin (MEVACOR) 40 MG tablet, TAKE 1 TABLET BY MOUTH AT BEDTIME **ANNUAL APPT DUE IN JUNE,MUST SEE PROVIDER FOR REFILLS*   Current Outpatient Medications (Analgesics):  .  allopurinol (ZYLOPRIM) 100 MG tablet, Take 1 tablet (100 mg total) by mouth 2 (two) times daily. .  indomethacin (INDOCIN) 25 MG capsule, TAKE 1 CAPSULE BY MOUTH TWICE A DAY AS NEEDED   Current Outpatient Medications (Other):  .  gabapentin (NEURONTIN) 100 MG capsule, Take 2 capsules (200 mg total) by mouth at bedtime. .  pantoprazole (PROTONIX) 40 MG tablet, Take 1 tablet (40 mg total) by mouth daily. (Patient taking differently: Take 40 mg by mouth as needed.) .  Vitamin D, Ergocalciferol, (DRISDOL) 1.25 MG (50000 UNIT) CAPS capsule, Take 1 capsule (50,000 Units total) by mouth every 7 (seven) days.   Reviewed prior external information including notes and imaging from  primary care provider As well as notes that were available from care everywhere and other healthcare systems.  Past medical history, social, surgical and family history all reviewed in electronic medical record.  No pertanent information unless stated regarding to the chief complaint.   Review of Systems:  No headache,  visual changes, nausea, vomiting, diarrhea, constipation, dizziness, abdominal pain, skin rash, fevers, chills, night sweats, weight loss, swollen lymph nodes, body aches, joint swelling, chest pain, shortness of breath, mood changes. POSITIVE muscle aches  Objective  Blood pressure 124/86, pulse 60, height 5\' 8"  (1.727 m), weight 233 lb (105.7 kg), SpO2 99 %.   General: No apparent distress alert and oriented x3 mood and affect normal, dressed appropriately.  HEENT: Pupils equal, extraocular movements intact  Respiratory: Patient's speak in full sentences and does not appear short of breath  Cardiovascular: No lower extremity edema, non tender, no erythema  Neuro: Cranial nerves II through XII are intact, neurovascularly intact in all extremities with 2+ DTRs and 2+ pulses.  Gait normal with good balance and coordination.  MSK: Does have arthritic changes noted. Patient's left knee does have some mild crepitus.  Lateral tracking of the patella.  No instability with valgus or varus force. Left hand exam shows some arthritic changes.  Mild positive Tinel's.  Good grip strength.  No wasting of the thenar eminence  Left shoulder exam shows significant improvement in range of motion.  Very mild still impingement with [place].  Nothing severe.    Impression and Recommendations:     The above documentation has been reviewed and is accurate and complete Lyndal Pulley, DO

## 2020-05-06 ENCOUNTER — Encounter: Payer: Self-pay | Admitting: Family Medicine

## 2020-05-06 ENCOUNTER — Ambulatory Visit (INDEPENDENT_AMBULATORY_CARE_PROVIDER_SITE_OTHER): Payer: Self-pay | Admitting: Family Medicine

## 2020-05-06 ENCOUNTER — Other Ambulatory Visit: Payer: Self-pay

## 2020-05-06 DIAGNOSIS — M1712 Unilateral primary osteoarthritis, left knee: Secondary | ICD-10-CM | POA: Insufficient documentation

## 2020-05-06 DIAGNOSIS — G5602 Carpal tunnel syndrome, left upper limb: Secondary | ICD-10-CM | POA: Insufficient documentation

## 2020-05-06 DIAGNOSIS — M7552 Bursitis of left shoulder: Secondary | ICD-10-CM

## 2020-05-06 NOTE — Assessment & Plan Note (Signed)
We discussed the anatomy involved, and that carpal tunnel syndrome primarily involves the median nerve, and this typically affects digits one through 3.  ° °We also discussed that mild cases of carpal tunnel syndrome are often improved with night splints.  If symptoms persist it is very reasonable to consider a carpal tunnel injection.  ° °If the patient does have moderate to severe carpal tunnel syndrome based on NCV, then it is certainly reasonable to consider surgical consultation for definitive management possible carpal tunnel release.  We also discussed his severe carpal tunnel syndrome can lead to permanent nerve impairment even if released. At this point, the patient like to proceed conservatively. ° °

## 2020-05-06 NOTE — Patient Instructions (Addendum)
Pennsaid 2x a day, finger tip sized amount Exercises 3x Brace for left side at night See me again 6 weeks

## 2020-05-06 NOTE — Assessment & Plan Note (Signed)
Patient is doing significantly better at this time.  No significant pain he states.  Nothing that stopping him from activity.  No change in management

## 2020-05-06 NOTE — Assessment & Plan Note (Signed)
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  Patient given some mild exercises that I think will be beneficial.  We discussed the potential for injections the patient wants to hold at this time.  Patient has taken indomethacin previously and encouraged him to continue with the allopurinol.  Follow-up again in 2 months if worsening pain consider injection

## 2020-05-07 ENCOUNTER — Ambulatory Visit: Payer: Self-pay | Admitting: Internal Medicine

## 2020-05-11 ENCOUNTER — Telehealth: Payer: Self-pay | Admitting: Family Medicine

## 2020-05-11 NOTE — Telephone Encounter (Signed)
Patient called asking to speak to Dr Tamala Julian or someone in his office. He would not disclose what happened or what he needed to talk about.

## 2020-05-11 NOTE — Telephone Encounter (Signed)
Call patient back.  Patient states that he has been wearing the brace and has noticed significant improvement since has been secondary to more the carpal tunnel.  Patient is very happy with the results.  Encouraged him to continue the same treatment plan to see him again on January 20.

## 2020-05-11 NOTE — Telephone Encounter (Signed)
Patient states he wants to talk directly to Dr. Tamala Julian. Will not disclose what he wants to talk about. I explained to him that we would not be able to get Dr. Tamala Julian on the phone until the end of the day and we have no openings for virtual visits. Please advise.

## 2020-05-18 ENCOUNTER — Encounter: Payer: Self-pay | Admitting: Internal Medicine

## 2020-05-18 ENCOUNTER — Other Ambulatory Visit: Payer: Self-pay

## 2020-05-18 ENCOUNTER — Ambulatory Visit (INDEPENDENT_AMBULATORY_CARE_PROVIDER_SITE_OTHER): Payer: Self-pay | Admitting: Internal Medicine

## 2020-05-18 VITALS — BP 122/78 | HR 50 | Temp 97.9°F | Ht 68.0 in | Wt 232.0 lb

## 2020-05-18 DIAGNOSIS — E7849 Other hyperlipidemia: Secondary | ICD-10-CM

## 2020-05-18 DIAGNOSIS — E538 Deficiency of other specified B group vitamins: Secondary | ICD-10-CM

## 2020-05-18 DIAGNOSIS — R5383 Other fatigue: Secondary | ICD-10-CM | POA: Insufficient documentation

## 2020-05-18 DIAGNOSIS — E785 Hyperlipidemia, unspecified: Secondary | ICD-10-CM | POA: Insufficient documentation

## 2020-05-18 DIAGNOSIS — E1142 Type 2 diabetes mellitus with diabetic polyneuropathy: Secondary | ICD-10-CM

## 2020-05-18 DIAGNOSIS — G4710 Hypersomnia, unspecified: Secondary | ICD-10-CM

## 2020-05-18 DIAGNOSIS — E559 Vitamin D deficiency, unspecified: Secondary | ICD-10-CM

## 2020-05-18 DIAGNOSIS — K219 Gastro-esophageal reflux disease without esophagitis: Secondary | ICD-10-CM

## 2020-05-18 DIAGNOSIS — Z23 Encounter for immunization: Secondary | ICD-10-CM

## 2020-05-18 DIAGNOSIS — D509 Iron deficiency anemia, unspecified: Secondary | ICD-10-CM

## 2020-05-18 DIAGNOSIS — I10 Essential (primary) hypertension: Secondary | ICD-10-CM

## 2020-05-18 LAB — CBC WITH DIFFERENTIAL/PLATELET
Basophils Absolute: 0 10*3/uL (ref 0.0–0.1)
Basophils Relative: 0.7 % (ref 0.0–3.0)
Eosinophils Absolute: 0.2 10*3/uL (ref 0.0–0.7)
Eosinophils Relative: 2.6 % (ref 0.0–5.0)
HCT: 38.1 % — ABNORMAL LOW (ref 39.0–52.0)
Hemoglobin: 12 g/dL — ABNORMAL LOW (ref 13.0–17.0)
Lymphocytes Relative: 40 % (ref 12.0–46.0)
Lymphs Abs: 2.6 10*3/uL (ref 0.7–4.0)
MCHC: 31.6 g/dL (ref 30.0–36.0)
MCV: 77.9 fl — ABNORMAL LOW (ref 78.0–100.0)
Monocytes Absolute: 0.6 10*3/uL (ref 0.1–1.0)
Monocytes Relative: 9 % (ref 3.0–12.0)
Neutro Abs: 3.1 10*3/uL (ref 1.4–7.7)
Neutrophils Relative %: 47.7 % (ref 43.0–77.0)
Platelets: 317 10*3/uL (ref 150.0–400.0)
RBC: 4.89 Mil/uL (ref 4.22–5.81)
RDW: 16.8 % — ABNORMAL HIGH (ref 11.5–15.5)
WBC: 6.5 10*3/uL (ref 4.0–10.5)

## 2020-05-18 LAB — IBC PANEL
Iron: 44 ug/dL (ref 42–165)
Saturation Ratios: 10.5 % — ABNORMAL LOW (ref 20.0–50.0)
Transferrin: 300 mg/dL (ref 212.0–360.0)

## 2020-05-18 LAB — LIPID PANEL
Cholesterol: 136 mg/dL (ref 0–200)
HDL: 38.2 mg/dL — ABNORMAL LOW (ref >39.00)
LDL Cholesterol: 68 mg/dL (ref 0–99)
NonHDL: 97.51
Total CHOL/HDL Ratio: 4
Triglycerides: 147 mg/dL (ref 0.0–149.0)
VLDL: 29.4 mg/dL (ref 0.0–40.0)

## 2020-05-18 LAB — VITAMIN D 25 HYDROXY (VIT D DEFICIENCY, FRACTURES): VITD: 58.56 ng/mL (ref 30.00–100.00)

## 2020-05-18 LAB — HEPATIC FUNCTION PANEL
ALT: 29 U/L (ref 0–53)
AST: 17 U/L (ref 0–37)
Albumin: 4.4 g/dL (ref 3.5–5.2)
Alkaline Phosphatase: 54 U/L (ref 39–117)
Bilirubin, Direct: 0.1 mg/dL (ref 0.0–0.3)
Total Bilirubin: 0.4 mg/dL (ref 0.2–1.2)
Total Protein: 7.5 g/dL (ref 6.0–8.3)

## 2020-05-18 LAB — BASIC METABOLIC PANEL
BUN: 19 mg/dL (ref 6–23)
CO2: 27 mEq/L (ref 19–32)
Calcium: 9.5 mg/dL (ref 8.4–10.5)
Chloride: 101 mEq/L (ref 96–112)
Creatinine, Ser: 1.04 mg/dL (ref 0.40–1.50)
GFR: 77.3 mL/min (ref >60.00)
Glucose, Bld: 247 mg/dL — ABNORMAL HIGH (ref 70–99)
Potassium: 4.3 mEq/L (ref 3.5–5.1)
Sodium: 134 mEq/L — ABNORMAL LOW (ref 135–145)

## 2020-05-18 LAB — VITAMIN B12: Vitamin B-12: 989 pg/mL — ABNORMAL HIGH (ref 211–911)

## 2020-05-18 LAB — TSH: TSH: 2.19 u[IU]/mL (ref 0.35–4.50)

## 2020-05-18 LAB — TESTOSTERONE: Testosterone: 218.13 ng/dL — ABNORMAL LOW (ref 300.00–890.00)

## 2020-05-18 LAB — FERRITIN: Ferritin: 70.8 ng/mL (ref 22.0–322.0)

## 2020-05-18 NOTE — Progress Notes (Deleted)
   Subjective:    Patient ID: Steven [name], male    DOB: Dec 21, 1960, 59 y.o.   MRN: [id number]  HPI  Considering covid vaccination soon (not done yet).    Review of Systems     Objective:   Physical Exam        Assessment & Plan:

## 2020-05-18 NOTE — Patient Instructions (Addendum)
After the high dose Vit Dis done, Please take OTC Vitamin D3 at 2000 units per day, indefinitely.  OK to stop the iron pill  You will be contacted regarding the referral for: Gastroenterology, and pulmonary  Please continue all other medications as before, and refills have been done if requested.  Please have the pharmacy call with any other refills you may need.  Please continue your efforts at being more active, low cholesterol diet, and weight control.  Please keep your appointments with your specialists as you may have planned  Please go to the LAB at the blood drawing area for the tests to be done  You will be contacted by phone if any changes need to be made immediately.  Otherwise, you will receive a letter about your results with an explanation, but please check with MyChart first.  Please remember to sign up for MyChart if you have not done so, as this will be important to you in the future with finding out test results, communicating by private email, and scheduling acute appointments online when needed.  Please make an Appointment to return in 4 months

## 2020-05-19 ENCOUNTER — Telehealth: Payer: Self-pay | Admitting: Internal Medicine

## 2020-05-19 LAB — URINALYSIS, ROUTINE W REFLEX MICROSCOPIC
Bilirubin Urine: NEGATIVE
Hgb urine dipstick: NEGATIVE
Ketones, ur: NEGATIVE
Leukocytes,Ua: NEGATIVE
Nitrite: NEGATIVE
RBC / HPF: NONE SEEN (ref 0–?)
Specific Gravity, Urine: >=1.03 — AB (ref 1.000–1.030)
Total Protein, Urine: NEGATIVE
Urine Glucose: NEGATIVE
Urobilinogen, UA: 0.2 (ref 0.0–1.0)
pH: 5.5 (ref 5.0–8.0)

## 2020-05-19 NOTE — Telephone Encounter (Signed)
Patient calling stating he wants to talk to Dr. Jenny Reichmann. Refusing any more details because "if I tell you he wont give me a call back". [phone]

## 2020-05-19 NOTE — Telephone Encounter (Signed)
Sorry, I am not able to call back patient under these circumstances; please ask pt to leave a message or request, or consider a follow up office visit if he feels this is needed

## 2020-05-19 NOTE — Telephone Encounter (Signed)
Sent to Dr. John. 

## 2020-05-24 NOTE — Telephone Encounter (Signed)
Pt did not answer when I called pt to inform him of Dr. Raphael Gibney note. Pt vm was full as well.

## 2020-05-25 ENCOUNTER — Encounter: Payer: Self-pay | Admitting: Internal Medicine

## 2020-05-25 NOTE — Assessment & Plan Note (Signed)
BP Readings from Last 3 Encounters:  05/18/20 122/78  05/06/20 124/86  04/12/20 130/88  stable overall by history and exam, recent data reviewed with pt, and pt to continue medical treatment as before,  to f/u any worsening symptoms or concerns

## 2020-05-25 NOTE — Assessment & Plan Note (Signed)
Refer pulm r/o osa

## 2020-05-25 NOTE — Assessment & Plan Note (Signed)
Also check testosteron per pt request

## 2020-05-25 NOTE — Assessment & Plan Note (Signed)
stable overall by history and exam, recent data reviewed with pt, and pt to continue medical treatment as before,  to f/u any worsening symptoms or concerns Lab Results  Component Value Date   LDLCALC 68 05/18/2020

## 2020-05-25 NOTE — Progress Notes (Signed)
Established Patient Office Visit  Subjective:  Patient ID: Steven [name], male    DOB: April 15, 1961  Age: 59 y.o. MRN: [id number]      Chief Complaint: (concise statement describing the symptom, problem, condition, diagnosis, physician recommended return, or other factor as reason for encounter) follow up HTN, HLD and hyperglycemia , vit d def, iron def, fatigue, hypersomnolence       HPI:  Antonyo [name] is a 59 y.o. male here to f/u; overall doing ok,  Pt denies chest pain, increasing sob or doe, wheezing, orthopnea, PND, increased LE swelling, palpitations, dizziness or syncope.  Pt denies new neurological symptoms such as new headache, or facial or extremity weakness or numbness.  Pt denies polydipsia, polyuria, or symptomatic low sugars. Pt states overall good compliance with meds, mostly trying to follow appropriate diet, with wt overall stable,  but little exercise however.  No overt bleeding. Does c/o ongoing fatigue, and has daytime somnolence Wt Readings from Last 3 Encounters:  05/18/20 232 lb (105.2 kg)  05/06/20 233 lb (105.7 kg)  04/12/20 232 lb 6.4 oz (105.4 kg)   BP Readings from Last 3 Encounters:  05/18/20 122/78  05/06/20 124/86  04/12/20 130/88       Past Medical History:  Diagnosis Date  . Chicken pox   . Diabetes mellitus without complication ([place])   . Hypertension    Past Surgical History:  Procedure Laterality Date  . INGUINAL HERNIA REPAIR Right 12/19/2017   Procedure: HERNIA REPAIR INGUINAL RIGHT;  Surgeon: Coralie Keens, MD;  Location: [facility];  Service: General;  Laterality: Right;  . INSERTION OF MESH Right 12/19/2017   Procedure: INSERTION OF MESH;  Surgeon: Coralie Keens, MD;  [facility]: [facility];  Service: General;  Laterality: Right;  . MASS EXCISION Right 12/19/2017   Procedure: EXCISION OF RIGHT SCROTAL MASS;  Surgeon: Coralie Keens, MD;  Location: [facility];  Service: General;  Laterality: Right;  . TONSILLECTOMY      reports that he has quit  smoking. His smoking use included cigarettes. He has a 50.00 pack-year smoking history. He has quit using smokeless tobacco.  His smokeless tobacco use included chew. He reports current alcohol use. He reports that he does not use drugs. family history includes Diabetes in his mother; Heart disease in his mother; Hypertension in his father and mother; Stroke in his brother and father. No Known Allergies Current Outpatient Medications on File Prior to Visit  Medication Sig Dispense Refill  . allopurinol (ZYLOPRIM) 100 MG tablet Take 1 tablet (100 mg total) by mouth 2 (two) times daily. 180 tablet 3  . gabapentin (NEURONTIN) 100 MG capsule Take 2 capsules (200 mg total) by mouth at bedtime. 180 capsule 3  . glipiZIDE (GLUCOTROL XL) 10 MG 24 hr tablet TAKE 1 TABLET BY MOUTH TWICE A DAY 180 tablet 3  . hydrochlorothiazide (MICROZIDE) 12.5 MG capsule TAKE 1 CAPSULE BY MOUTH DAILY. ANNUAL APPT DUE IN JUNE MUST SEE PROVIDER FOR FUTURE REFILLS (Patient taking differently: TAKE 1 CAPSULE BY MOUTH DAILY.) 90 capsule 3  . indomethacin (INDOCIN) 25 MG capsule TAKE 1 CAPSULE BY MOUTH TWICE A DAY AS NEEDED 180 capsule 1  . insulin NPH Human (NOVOLIN N RELION) 100 UNIT/ML injection Inject 0.25 mLs (25 Units total) into the skin every morning. And syringes 1/day 10 mL 11  . lisinopril (ZESTRIL) 20 MG tablet 1 tab by mouth once daily 90 tablet 3  . lovastatin (MEVACOR) 40 MG tablet TAKE 1 TABLET BY MOUTH AT BEDTIME **  ANNUAL APPT DUE IN JUNE,MUST SEE PROVIDER FOR REFILLS* 90 tablet 3  . metFORMIN (GLUCOPHAGE) 1000 MG tablet TAKE 1 TABLET BY MOUTH TWICE A DAY WITH FOOD 180 tablet 1  . pantoprazole (PROTONIX) 40 MG tablet Take 1 tablet (40 mg total) by mouth daily. (Patient taking differently: Take 40 mg by mouth as needed.) 90 tablet 3   No current facility-administered medications on file prior to visit.        ROS:  All others reviewed and negative.  Objective        PE:  BP 122/78 (BP Location: Left Arm,  Patient Position: Sitting, Cuff Size: Large)   Pulse (!) 50   Temp 97.9 F (36.6 C) (Oral)   Ht 5\' 8"  (1.727 m)   Wt 232 lb (105.2 kg)   SpO2 97%   BMI 35.28 kg/m                 Constitutional: Pt appears in NAD               HENT: Head: NCAT.                Right Ear: External ear normal.                 Left Ear: External ear normal.                Eyes: . Pupils are equal, round, and reactive to light. Conjunctivae and EOM are normal               Nose: without d/c or deformity               Neck: Neck supple. Gross normal ROM               Cardiovascular: Normal rate and regular rhythm.                 Pulmonary/Chest: Effort normal and breath sounds without rales or wheezing.                Abd:  Soft, NT, ND, + BS, no organomegaly               Neurological: Pt is alert. At baseline orientation, motor grossly intact               Skin: Skin is warm. No rashes, no other new lesions, LE edema - none               Psychiatric: Pt behavior is normal without agitation   Assessment/Plan:  Steven [name] is a 59 y.o. Black or African American [2] male with  has a past medical history of Chicken pox, Diabetes mellitus without complication (HCC), and Hypertension.   Assessment Plan  See problem oriented assessment and plan Labs reviewed for each problem: Lab Results  Component Value Date   WBC 6.5 05/18/2020   HGB 12.0 (L) 05/18/2020   HCT 38.1 (L) 05/18/2020   PLT 317.0 05/18/2020   GLUCOSE 247 (H) 05/18/2020   CHOL 136 05/18/2020   TRIG 147.0 05/18/2020   HDL 38.20 (L) 05/18/2020   LDLCALC 68 05/18/2020   ALT 29 05/18/2020   AST 17 05/18/2020   NA 134 (L) 05/18/2020   K 4.3 05/18/2020   CL 101 05/18/2020   CREATININE 1.04 05/18/2020   BUN 19 05/18/2020   CO2 27 05/18/2020   TSH 2.19 05/18/2020   PSA 0.52 11/06/2019   HGBA1C 9.4 (A) 03/01/2020  MICROALBUR <0.7 11/06/2019    Micro: none  Cardiac tracings I have personally interpreted today:  none  Pertinent  Radiological findings (summarize): none   I spent total 35 minutes in caring for the patient for this visit:  1) by communicating with the patient during the visit  2) by review of pertinent vital sign data, physical examination and labs as documented in the assessment and plan  3) by review of pertinent imaging - none today  4) by review of pertinent procedures - none today  5) by obtaining and reviewing separately obtained information from family/caretaker and Care Everywhere - none today  6) by ordering medications  7) by ordering tests  8) by documenting all of this clinical information in the EHR including the management of each problem noted today in assessment and plan   There are no preventive care reminders to display for this patient.   Problem List Items Addressed This Visit      Medium   Vitamin D deficiency    To start vit d 2000 u qd      Relevant Orders   VITAMIN D 25 Hydroxy (Vit-D Deficiency, Fractures) (Completed)   Iron deficiency anemia    D/c oral iron, for iron lab, refer GI as did not go to referral last year      Relevant Orders   Ambulatory referral to Gastroenterology   IBC panel (Completed)   Ferritin (Completed)   Hypersomnolence    Refer pulm r/o osa      Relevant Orders   Ambulatory referral to Pulmonology   HLD (hyperlipidemia)    stable overall by history and exam, recent data reviewed with pt, and pt to continue medical treatment as before,  to f/u any worsening symptoms or concerns Lab Results  Component Value Date   LDLCALC 68 05/18/2020         Relevant Orders   Lipid panel (Completed)   Hepatic function panel (Completed)   CBC with Differential/Platelet (Completed)   TSH (Completed)   Urinalysis, Routine w reflex microscopic (Completed)   Basic metabolic panel (Completed)   GERD (gastroesophageal reflux disease) - Primary    stable overall by history and exam, recent data reviewed with pt, and pt to continue medical  treatment as before,  to f/u any worsening symptoms or concerns       Fatigue    Also check testosteron per pt request      Relevant Orders   Testosterone (Completed)   Essential hypertension    BP Readings from Last 3 Encounters:  05/18/20 122/78  05/06/20 124/86  04/12/20 130/88  stable overall by history and exam, recent data reviewed with pt, and pt to continue medical treatment as before,  to f/u any worsening symptoms or concerns       Diabetes (HCC)    Lab Results  Component Value Date   HGBA1C 9.4 (A) 03/01/2020  uncontrolled, refer endo       Other Visit Diagnoses    B12 deficiency       Relevant Orders   Vitamin B12 (Completed)   Flu vaccine need       Relevant Orders   Flu Vaccine QUAD 6+ mos PF IM (Fluarix Quad PF) (Completed)      No orders of the defined types were placed in this encounter.   Follow-up: Return in about 4 months (around 09/16/2020).   Oliver Barre, MD 05/25/2020 9:22 PM [facility] Internal  Medicine

## 2020-05-25 NOTE — Assessment & Plan Note (Signed)
stable overall by history and exam, recent data reviewed with pt, and pt to continue medical treatment as before,  to f/u any worsening symptoms or concerns  

## 2020-05-25 NOTE — Assessment & Plan Note (Signed)
D/c oral iron, for iron lab, refer GI as did not go to referral last year

## 2020-05-25 NOTE — Assessment & Plan Note (Signed)
Lab Results  Component Value Date   HGBA1C 9.4 (A) 03/01/2020  uncontrolled, refer endo

## 2020-05-25 NOTE — Assessment & Plan Note (Signed)
To start vit d 2000 u qd 

## 2020-06-16 NOTE — Progress Notes (Unsigned)
[place] Phone: [phone] Subjective:   Steven [name], am serving as a scribe for Dr. Hulan Saas. This visit occurred during the SARS-CoV-2 public health emergency.  Safety protocols were in place, including screening questions prior to the visit, additional usage of staff PPE, and extensive cleaning of exam room while observing appropriate contact time as indicated for disinfecting solutions.   I'm seeing this patient by the request  of:  [name], MD  CC:    [id number]   05/06/2020 We discussed the anatomy involved, and that carpal tunnel syndrome primarily involves the median nerve, and this typically affects digits one through 3.   We also discussed that mild cases of carpal tunnel syndrome are often improved with night splints.  If symptoms persist it is very reasonable to consider a carpal tunnel injection.   If the patient does have moderate to severe carpal tunnel syndrome based on NCV, then it is certainly reasonable to consider surgical consultation for definitive management possible carpal tunnel release.  We also discussed his severe carpal tunnel syndrome can lead to permanent nerve impairment even if released. At this point, the patient like to proceed conservatively.  Patient is doing significantly better at this time.  [name] significant pain he states.  Nothing that stopping him from activity.  [name] change in management   Update 06/17/2020 Steven [name] is a 60 y.o. male coming in with complaint of left knee, shoulder and wrist. Wears brace at night and occasionally has pain with brace on so he will remove it. Overall does feel improvement in wrist.  Patient is taking the gabapentin regularly, has not noticed any side effects.  States that overall seems to be doing well during the day and only at night and having difficulty.  States that shoulder pain is improving as is left knee pain. [name]  pain lately in either joint.  Would state that they are 85% or better.      Past Medical History:  Diagnosis Date  . Chicken pox   . Diabetes mellitus without complication ([place])   . Hypertension    Past Surgical History:  Procedure Laterality Date  . INGUINAL HERNIA REPAIR Right 12/19/2017   Procedure: HERNIA REPAIR INGUINAL RIGHT;  Surgeon: Coralie Keens, MD;  [facility];  Service: General;  Laterality: Right;  . INSERTION OF MESH Right 12/19/2017   Procedure: INSERTION OF MESH;  Surgeon: Coralie Keens, MD;  [facility];  Service: General;  Laterality: Right;  . MASS EXCISION Right 12/19/2017   Procedure: EXCISION OF RIGHT SCROTAL MASS;  Surgeon: Coralie Keens, MD;  Location: [facility];  Service: General;  Laterality: Right;  . TONSILLECTOMY     Social History   Socioeconomic History  . Marital status: Single    Spouse name: Not on file  . Number of children: 1  . Years of education: 43  . Highest education level: Not on file  Occupational History  . Occupation: Architect  Tobacco Use  . Smoking status: Former Smoker    Packs/day: 2.00    Years: 25.00    Pack years: 50.00    Types: Cigarettes  . Smokeless tobacco: Former Systems developer    Types: Chew  Substance and Sexual Activity  . Alcohol use: Yes    Comment: occasionally  . Drug use: [name]  . Sexual activity: Yes    Birth control/protection: Condom  Other Topics Concern  . Not on file  Social History Narrative   Fun: Just working   Scientist, physiological Strain: Not on file  Food Insecurity: Not on file  Transportation Needs: Not on file  Physical Activity: Not on file  Stress: Not on file  Social Connections: Not on file   [name] Known Allergies Family History  Problem Relation Age of Onset  . Heart disease Mother   . Hypertension Mother   . Diabetes Mother   . Stroke Father   . Hypertension Father   . Stroke Brother     Current Outpatient Medications  (Endocrine & Metabolic):  .  glipiZIDE (GLUCOTROL XL) 10 MG 24 hr tablet, TAKE 1 TABLET BY MOUTH TWICE A DAY .  insulin NPH Human (NOVOLIN N RELION) 100 UNIT/ML injection, Inject 0.25 mLs (25 Units total) into the skin every morning. And syringes 1/day .  metFORMIN (GLUCOPHAGE) 1000 MG tablet, TAKE 1 TABLET BY MOUTH TWICE A DAY WITH FOOD  Current Outpatient Medications (Cardiovascular):  .  hydrochlorothiazide (MICROZIDE) 12.5 MG capsule, TAKE 1 CAPSULE BY MOUTH DAILY. ANNUAL APPT DUE IN JUNE MUST SEE PROVIDER FOR FUTURE REFILLS (Patient taking differently: TAKE 1 CAPSULE BY MOUTH DAILY.) .  lisinopril (ZESTRIL) 20 MG tablet, 1 tab by mouth once daily .  lovastatin (MEVACOR) 40 MG tablet, TAKE 1 TABLET BY MOUTH AT BEDTIME **ANNUAL APPT DUE IN JUNE,MUST SEE PROVIDER FOR REFILLS*   Current Outpatient Medications (Analgesics):  .  allopurinol (ZYLOPRIM) 100 MG tablet, Take 1 tablet (100 mg total) by mouth 2 (two) times daily. .  indomethacin (INDOCIN) 25 MG capsule, TAKE 1 CAPSULE BY MOUTH TWICE A DAY AS NEEDED   Current Outpatient Medications (Other):  .  gabapentin (NEURONTIN) 100 MG capsule, Take 2 capsules (200 mg total) by mouth at bedtime. .  pantoprazole (PROTONIX) 40 MG tablet, Take 1 tablet (40 mg total) by mouth daily. (Patient taking differently: Take 40 mg by mouth as needed.)   Reviewed prior external information including notes and imaging from  primary care provider As well as notes that were available from care everywhere and other healthcare systems.  Past medical history, social, surgical and family history all reviewed in electronic medical record.  [name] pertanent information unless stated regarding to the chief complaint.   Review of Systems:  [name] headache, visual changes, nausea, vomiting, diarrhea, constipation, dizziness, abdominal pain, skin rash, fevers, chills, night sweats, weight loss, swollen lymph nodes,  joint swelling, chest pain, shortness of breath, mood  changes. POSITIVE muscle aches, body aches but mild  Objective  Blood pressure 122/74, pulse (!) 57, height 5\' 8"  (1.727 m), weight 230 lb (104.3 kg), SpO2 98 %.   General: [name] apparent distress alert and oriented x3 mood and affect normal, dressed appropriately.  HEENT: Pupils equal, extraocular movements intact  Respiratory: Patient's speak in full sentences and does not appear short of breath  Cardiovascular: [name] lower extremity edema, non tender, [name] erythema  Gait normal with good balance and coordination.  MSK: Mild arthritic changes of multiple joints Left shoulder exam shows the patient does have good range of motion at this time.  Very mild positive impingement noted.  Rotator cuff is intact.  Left wrist exam shows that patient has good range of motion.  Very mild positive Tinel's.  Good grip strength.  [name] thenar eminence wasting.  Knee exams are show some arthritic changes.  Mild crepitus with range of motion.  Mild lateral tracking of the patella noted.  Mild tenderness noted over  the medial joint line  Limited musculoskeletal ultrasound was performed and interpreted by Lyndal Pulley  Limited ultrasound of patient's carpal tunnel or median nerve shows that patient does still have some enlargement with a diameter of 0.25 but mild hypoechoic changes. Impression: Carpal tunnel mild to moderate in severity    Impression and Recommendations:     The above documentation has been reviewed and is accurate and complete Lyndal Pulley, DO

## 2020-06-17 ENCOUNTER — Ambulatory Visit: Payer: Self-pay

## 2020-06-17 ENCOUNTER — Encounter: Payer: Self-pay | Admitting: Family Medicine

## 2020-06-17 ENCOUNTER — Telehealth: Payer: Self-pay

## 2020-06-17 ENCOUNTER — Ambulatory Visit (INDEPENDENT_AMBULATORY_CARE_PROVIDER_SITE_OTHER): Payer: Self-pay | Admitting: Family Medicine

## 2020-06-17 ENCOUNTER — Other Ambulatory Visit: Payer: Self-pay

## 2020-06-17 VITALS — BP 122/74 | HR 57 | Ht 68.0 in | Wt 230.0 lb

## 2020-06-17 DIAGNOSIS — G5602 Carpal tunnel syndrome, left upper limb: Secondary | ICD-10-CM

## 2020-06-17 DIAGNOSIS — G8929 Other chronic pain: Secondary | ICD-10-CM

## 2020-06-17 DIAGNOSIS — M7552 Bursitis of left shoulder: Secondary | ICD-10-CM

## 2020-06-17 DIAGNOSIS — M25512 Pain in left shoulder: Secondary | ICD-10-CM

## 2020-06-17 DIAGNOSIS — M1712 Unilateral primary osteoarthritis, left knee: Secondary | ICD-10-CM

## 2020-06-17 NOTE — Assessment & Plan Note (Signed)
Patient is having very minimal pain at this time.  No significant difficulty.  With patient doing well we will not make any changes.  May be a candidate for injections at some point or formal physical therapy.  Follow-up again in 3 months

## 2020-06-17 NOTE — Telephone Encounter (Signed)
Unfortunately I dont see where the pt is taking any oral iron  Other things to consider would be peptobismol or collard greens  If pt has Tarry stools, and not taking any above, please have pt go NOW to ED for possible GI bleeding

## 2020-06-17 NOTE — Telephone Encounter (Signed)
Patient has been notified and declined ed at the moment he states this has been going on for months.

## 2020-06-17 NOTE — Telephone Encounter (Signed)
Pt called stating his bowels are like tar and it seems to be a side effect to one of his medications. When asked which medication he stated he couldn't remember. He is wondering what to do about this. Please advise and give pt a call back thanks.

## 2020-06-17 NOTE — Assessment & Plan Note (Signed)
Patient is doing relatively well at this time.  Continue the brace at night and at home exercises.  Patient is having very minimal side effects of the gabapentin and can continue it.  Recent labs from primary care provider were unremarkable.  Patient knows that we do have occupational therapy, injections, as well as possible nerve conduction studies if necessary.  Follow-up again in 3 months

## 2020-06-17 NOTE — Assessment & Plan Note (Signed)
Seems to be completely resolved at this time. 

## 2020-06-17 NOTE — Patient Instructions (Signed)
For wrist watch out for numbness or weakness Up to you on brace at night If it helps continue Have appt in 3 months just in case otherwise see me when you need me

## 2020-06-21 ENCOUNTER — Other Ambulatory Visit: Payer: Self-pay

## 2020-06-23 ENCOUNTER — Other Ambulatory Visit: Payer: Self-pay

## 2020-06-23 ENCOUNTER — Ambulatory Visit: Payer: Self-pay | Admitting: Endocrinology

## 2020-06-24 ENCOUNTER — Other Ambulatory Visit: Payer: Self-pay | Admitting: Family Medicine

## 2020-06-25 ENCOUNTER — Other Ambulatory Visit: Payer: Self-pay

## 2020-06-25 ENCOUNTER — Ambulatory Visit: Payer: Self-pay | Admitting: Endocrinology

## 2020-06-25 VITALS — BP 118/62 | HR 62 | Ht 67.0 in | Wt 233.6 lb

## 2020-06-25 DIAGNOSIS — E1142 Type 2 diabetes mellitus with diabetic polyneuropathy: Secondary | ICD-10-CM

## 2020-06-25 LAB — POCT GLYCOSYLATED HEMOGLOBIN (HGB A1C): Hemoglobin A1C: 9.3 % — AB (ref 4.0–5.6)

## 2020-06-25 MED ORDER — INSULIN NPH (HUMAN) (ISOPHANE) 100 UNIT/ML ~~LOC~~ SUSP: 35.0000 [IU] | SUBCUTANEOUS | 11 refills | Status: DC

## 2020-06-25 NOTE — Progress Notes (Signed)
Subjective:    Patient ID: Steven [name], male    DOB: 12/11/1960, 60 y.o.   MRN: [id number]  HPI Pt returns for f/u of diabetes mellitus: DM type: Insulin-requiring type 2 Dx'ed: 5366 Complications: PN Therapy: 2 oral meds.   DKA: never Severe hypoglycemia: never Pancreatitis: never Pancreatic imaging: never SDOH: he gets discount from [facility]; he is intermittently homeless Other: he declines multiple daily injections.   Interval history: no cbg record, but states cbg's vary from 166-300.  There is no trend throughout the day.  He takes 23 units qam.  He never misses the insulin.  pt states he feels well in general. Past Medical History:  Diagnosis Date  . Chicken pox   . Diabetes mellitus without complication ([place])   . Hypertension     Past Surgical History:  Procedure Laterality Date  . INGUINAL HERNIA REPAIR Right 12/19/2017   Procedure: HERNIA REPAIR INGUINAL RIGHT;  Surgeon: Coralie Keens, MD;  Location: [facility];  Service: General;  Laterality: Right;  . INSERTION OF MESH Right 12/19/2017   Procedure: INSERTION OF MESH;  Surgeon: Coralie Keens, MD;  [facility];  Service: General;  Laterality: Right;  . MASS EXCISION Right 12/19/2017   Procedure: EXCISION OF RIGHT SCROTAL MASS;  Surgeon: Coralie Keens, MD;  Location: [facility];  Service: General;  Laterality: Right;  . TONSILLECTOMY      Social History   Socioeconomic History  . Marital status: Single    Spouse name: Not on file  . Number of children: 1  . Years of education: 89  . Highest education level: Not on file  Occupational History  . Occupation: Architect  Tobacco Use  . Smoking status: Former Smoker    Packs/day: 2.00    Years: 25.00    Pack years: 50.00    Types: Cigarettes  . Smokeless tobacco: Former Systems developer    Types: Chew  Substance and Sexual Activity  . Alcohol use: Yes    Comment: occasionally  . Drug use: No  . Sexual activity: Yes    Birth control/protection: Condom   Other Topics Concern  . Not on file  Social History Narrative   Fun: Just working   Investment banker, operational of Radio broadcast assistant Strain: Not on file  Food Insecurity: Not on file  Transportation Needs: Not on file  Physical Activity: Not on file  Stress: Not on file  Social Connections: Not on file  Intimate Partner Violence: Not on file    Current Outpatient Medications on File Prior to Visit  Medication Sig Dispense Refill  . allopurinol (ZYLOPRIM) 100 MG tablet Take 1 tablet (100 mg total) by mouth 2 (two) times daily. 180 tablet 3  . gabapentin (NEURONTIN) 100 MG capsule Take 2 capsules (200 mg total) by mouth at bedtime. 180 capsule 3  . glipiZIDE (GLUCOTROL XL) 10 MG 24 hr tablet TAKE 1 TABLET BY MOUTH TWICE A DAY 180 tablet 3  . hydrochlorothiazide (MICROZIDE) 12.5 MG capsule TAKE 1 CAPSULE BY MOUTH DAILY. ANNUAL APPT DUE IN JUNE MUST SEE PROVIDER FOR FUTURE REFILLS (Patient taking differently: TAKE 1 CAPSULE BY MOUTH DAILY.) 90 capsule 3  . indomethacin (INDOCIN) 25 MG capsule TAKE 1 CAPSULE BY MOUTH TWICE A DAY AS NEEDED 180 capsule 1  . lisinopril (ZESTRIL) 20 MG tablet 1 tab by mouth once daily 90 tablet 3  . lovastatin (MEVACOR) 40 MG tablet TAKE 1 TABLET BY MOUTH AT BEDTIME **ANNUAL APPT DUE IN [place] SEE PROVIDER FOR  REFILLS* 90 tablet 3  . metFORMIN (GLUCOPHAGE) 1000 MG tablet TAKE 1 TABLET BY MOUTH TWICE A DAY WITH FOOD 180 tablet 1  . pantoprazole (PROTONIX) 40 MG tablet Take 1 tablet (40 mg total) by mouth daily. (Patient taking differently: Take 40 mg by mouth as needed.) 90 tablet 3   No current facility-administered medications on file prior to visit.    No Known Allergies  Family History  Problem Relation Age of Onset  . Heart disease Mother   . Hypertension Mother   . Diabetes Mother   . Stroke Father   . Hypertension Father   . Stroke Brother     BP 118/62 (BP Location: Right Arm, Patient Position: Sitting, Cuff Size: Large)   Pulse 62    Ht 5\' 7"  (1.702 m)   Wt 233 lb 9.6 oz (106 kg)   SpO2 99%   BMI 36.59 kg/m    Review of Systems He denies hypoglycemia.      Objective:   Physical Exam VITAL SIGNS:  See vs page GENERAL: no distress Pulses: dorsalis pedis intact bilat.   MSK: no deformity of the feet CV: no leg edema Skin:  no ulcer on the feet.  normal color and temp on the feet. Neuro: sensation is intact to touch on the feet Ext: there is bilateral onychomycosis of the toenails.    Lab Results  Component Value Date   HGBA1C 9.3 (A) 06/25/2020       Assessment & Plan:  Insulin-requiring type 2 DM, with PN: uncontrolled.   Patient Instructions  check your blood sugar once a day.  vary the time of day when you check, between before the 3 meals, and at bedtime.  also check if you have symptoms of your blood sugar being too high or too low.  please keep a record of the readings and bring it to your next appointment here (or you can bring the meter itself).  You can write it on any piece of paper.  please call us sooner if your blood sugar goes below 70, or if you have a lot of readings over 200. Please increase the insulin to 35 units each morning.  Please continue the same other medications.  On this type of insulin schedule, you should eat meals on a regular schedule (especially lunch).  If a meal is missed or significantly delayed, your blood sugar could go low.   Please come back for a follow-up appointment in 2 months.

## 2020-06-25 NOTE — Patient Instructions (Addendum)
check your blood sugar once a day.  vary the time of day when you check, between before the 3 meals, and at bedtime.  also check if you have symptoms of your blood sugar being too high or too low.  please keep a record of the readings and bring it to your next appointment here (or you can bring the meter itself).  You can write it on any piece of paper.  please call us sooner if your blood sugar goes below 70, or if you have a lot of readings over 200. Please increase the insulin to 35 units each morning.  Please continue the same other medications.  On this type of insulin schedule, you should eat meals on a regular schedule (especially lunch).  If a meal is missed or significantly delayed, your blood sugar could go low.   Please come back for a follow-up appointment in 2 months.

## 2020-07-26 ENCOUNTER — Ambulatory Visit: Payer: Self-pay | Admitting: Dietician

## 2020-07-29 ENCOUNTER — Ambulatory Visit: Payer: Self-pay | Admitting: Dietician

## 2020-08-04 ENCOUNTER — Encounter: Payer: Self-pay | Admitting: *Deleted

## 2020-08-09 ENCOUNTER — Other Ambulatory Visit: Payer: Self-pay | Admitting: Internal Medicine

## 2020-08-09 ENCOUNTER — Ambulatory Visit (INDEPENDENT_AMBULATORY_CARE_PROVIDER_SITE_OTHER): Payer: Self-pay | Admitting: Gastroenterology

## 2020-08-09 ENCOUNTER — Other Ambulatory Visit (INDEPENDENT_AMBULATORY_CARE_PROVIDER_SITE_OTHER): Payer: Self-pay

## 2020-08-09 ENCOUNTER — Encounter: Payer: Self-pay | Admitting: Gastroenterology

## 2020-08-09 VITALS — BP 130/80 | HR 56 | Ht 68.0 in | Wt 226.0 lb

## 2020-08-09 DIAGNOSIS — D649 Anemia, unspecified: Secondary | ICD-10-CM

## 2020-08-09 DIAGNOSIS — R195 Other fecal abnormalities: Secondary | ICD-10-CM

## 2020-08-09 DIAGNOSIS — D509 Iron deficiency anemia, unspecified: Secondary | ICD-10-CM

## 2020-08-09 LAB — IBC + FERRITIN
Ferritin: 64.8 ng/mL (ref 22.0–322.0)
Iron: 45 ug/dL (ref 42–165)
Saturation Ratios: 10.7 % — ABNORMAL LOW (ref 20.0–50.0)
Transferrin: 300 mg/dL (ref 212.0–360.0)

## 2020-08-09 LAB — CBC WITH DIFFERENTIAL/PLATELET
Basophils Absolute: 0 10*3/uL (ref 0.0–0.1)
Basophils Relative: 0.5 % (ref 0.0–3.0)
Eosinophils Absolute: 0.2 10*3/uL (ref 0.0–0.7)
Eosinophils Relative: 2.5 % (ref 0.0–5.0)
HCT: 37.9 % — ABNORMAL LOW (ref 39.0–52.0)
Hemoglobin: 12 g/dL — ABNORMAL LOW (ref 13.0–17.0)
Lymphocytes Relative: 39.5 % (ref 12.0–46.0)
Lymphs Abs: 2.6 10*3/uL (ref 0.7–4.0)
MCHC: 31.6 g/dL (ref 30.0–36.0)
MCV: 77.3 fl — ABNORMAL LOW (ref 78.0–100.0)
Monocytes Absolute: 0.6 10*3/uL (ref 0.1–1.0)
Monocytes Relative: 8.5 % (ref 3.0–12.0)
Neutro Abs: 3.2 10*3/uL (ref 1.4–7.7)
Neutrophils Relative %: 49 % (ref 43.0–77.0)
Platelets: 336 10*3/uL (ref 150.0–400.0)
RBC: 4.91 Mil/uL (ref 4.22–5.81)
RDW: 17.8 % — ABNORMAL HIGH (ref 11.5–15.5)
WBC: 6.6 10*3/uL (ref 4.0–10.5)

## 2020-08-09 LAB — FOLATE: Folate: 21 ng/mL (ref >5.9)

## 2020-08-09 NOTE — Progress Notes (Signed)
[name] Consult Note:  History: Steven [name] 08/09/2020  Referring provider: [name], MD  Reason for consult/chief complaint: Anemia (Patient thinks he was referred here for Vitamin D deficiency/) and Melena (Never has had a colonoscopy, and states he is no longer on an iron supplement )   Subjective  HPI:  This is a 60 year old man referred by primary care for iron deficiency anemia.  See extensive lab studies noted below for history of that.  It was put on iron tablets sometime last year and they were recently discontinued.  He was having black stool at some point, he cannot recall whether it was present before after taking the iron.  (Limited health literacy and historian) He denies abdominal pain, his bowel habits are regular without constipation or diarrhea, and he has not seen bright red blood per rectum.  He denies heartburn, dysphagia, odynophagia, nausea vomiting early satiety or weight loss. No prior screening or diagnostic colonoscopy.  ROS:  Review of Systems  Constitutional: Negative for appetite change and unexpected weight change.  HENT: Negative for mouth sores and voice change.   Eyes: Negative for pain and redness.  Respiratory: Negative for cough and shortness of breath.   Cardiovascular: Negative for chest pain and palpitations.  Genitourinary: Negative for dysuria and hematuria.  Musculoskeletal: Negative for arthralgias and myalgias.  Skin: Negative for pallor and rash.  Neurological: Negative for weakness and headaches.  Hematological: Negative for adenopathy.     Past Medical History: Past Medical History:  Diagnosis Date  . Chicken pox   . Diabetes mellitus without complication ([place])   . Essential hypertension   . GERD (gastroesophageal reflux disease)   . Gouty arthritis   . Hypertension   . Iron deficiency anemia   . Vitamin D deficiency      Past Surgical History: Past Surgical History:  Procedure  Laterality Date  . INGUINAL HERNIA REPAIR Right 12/19/2017   Procedure: HERNIA REPAIR INGUINAL RIGHT;  Surgeon: Coralie Keens, MD;  Location: [facility];  Service: General;  Laterality: Right;  . INSERTION OF MESH Right 12/19/2017   Procedure: INSERTION OF MESH;  Surgeon: Coralie Keens, MD;  Location: [facility];  Service: General;  Laterality: Right;  . MASS EXCISION Right 12/19/2017   Procedure: EXCISION OF RIGHT SCROTAL MASS;  Surgeon: Coralie Keens, MD;  Location: [facility];  Service: General;  Laterality: Right;  . TONSILLECTOMY       Family History: Family History  Problem Relation Age of Onset  . Heart disease Mother   . Hypertension Mother   . Diabetes Mother   . Stroke Father   . Hypertension Father   . Stroke Brother     Social History: Social History   Socioeconomic History  . Marital status: Single    Spouse name: Not on file  . Number of children: 1  . Years of education: 21  . Highest education level: Not on file  Occupational History  . Occupation: Architect  Tobacco Use  . Smoking status: Former Smoker    Packs/day: 2.00    Years: 25.00    Pack years: 50.00    Types: Cigarettes  . Smokeless tobacco: Former Systems developer    Types: Chew  Substance and Sexual Activity  . Alcohol use: Yes    Comment: occasionally  . Drug use: No  . Sexual activity: Yes    Birth control/protection: Condom  Other Topics Concern  . 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  Allergies: No Known Allergies  Outpatient Meds: Current Outpatient Medications  Medication Sig Dispense Refill  . allopurinol (ZYLOPRIM) 100 MG tablet  Take 1 tablet (100 mg total) by mouth 2 (two) times daily. 180 tablet 3  . gabapentin (NEURONTIN) 100 MG capsule Take 2 capsules (200 mg total) by mouth at bedtime. 180 capsule 3  . glipiZIDE (GLUCOTROL XL) 10 MG 24 hr tablet TAKE 1 TABLET BY MOUTH TWICE A DAY 180 tablet 3  . hydrochlorothiazide (MICROZIDE) 12.5 MG capsule TAKE 1 CAPSULE BY MOUTH DAILY. ANNUAL APPT DUE IN JUNE MUST SEE PROVIDER FOR FUTURE REFILLS (Patient taking differently: TAKE 1 CAPSULE BY MOUTH DAILY.) 90 capsule 3  . indomethacin (INDOCIN) 25 MG capsule TAKE 1 CAPSULE BY MOUTH TWICE A DAY AS NEEDED 180 capsule 1  . insulin NPH Human (NOVOLIN N RELION) 100 UNIT/ML injection Inject 0.35 mLs (35 Units total) into the skin every morning. And syringes 1/day 10 mL 11  . lisinopril (ZESTRIL) 20 MG tablet 1 tab by mouth once daily 90 tablet 3  . lovastatin (MEVACOR) 40 MG tablet TAKE 1 TABLET BY MOUTH AT BEDTIME **ANNUAL APPT DUE IN JUNE,MUST SEE PROVIDER FOR REFILLS* 90 tablet 3  . metFORMIN (GLUCOPHAGE) 1000 MG tablet TAKE 1 TABLET BY MOUTH TWICE A DAY WITH FOOD 180 tablet 1  . pantoprazole (PROTONIX) 40 MG tablet Take 1 tablet (40 mg total) by mouth daily. (Patient taking differently: Take 40 mg by mouth as needed.) 90 tablet 3   No current facility-administered medications for this visit.      ___________________________________________________________________ Objective   Exam:  BP 130/80 (BP Location: Left Arm, Patient Position: Sitting)   Pulse (!) 56   Ht 5\' 8"  (1.727 m)   Wt 226 lb (102.5 kg)   SpO2 98%   BMI 34.36 kg/m  Wt Readings from Last 3 Encounters:  08/09/20 226 lb (102.5 kg)  06/25/20 233 lb 9.6 oz (106 kg)  06/17/20 230 lb (104.3 kg)     General: Well-appearing  Eyes: sclera anicteric, no redness  ENT: oral mucosa moist without lesions, no cervical or supraclavicular lymphadenopathy  CV: RRR without murmur, S1/S2, no JVD, trace bilateral peripheral edema  Resp: clear to auscultation  bilaterally, normal RR and effort noted  GI: soft, obese, no tenderness, with active bowel sounds. No guarding or palpable organomegaly noted.  Skin; warm and dry, no rash or jaundice noted  Neuro: awake, alert and oriented x 3. Normal gross motor function and fluent speech Rectal: Normal external, no tenderness or palpable internal lesion.  Soft brown stool, heme positive  Labs:  CBC Latest Ref Rng & Units 05/18/2020 11/06/2019 11/01/2018  WBC 4.0 - 10.5 K/uL 6.5 6.5 7.3  Hemoglobin 13.0 - 17.0 g/dL 12.0(L) 11.5(L) 11.4(L)  Hematocrit 39.0 - 52.0 % 38.1(L) 35.8(L) 35.1(L)  Platelets 150.0 - 400.0 K/uL 317.0 290.0 337.0   Similar CBC with hemoglobin of 11.7 in July 2019, nadir of 10.2 in September 2019, MCV typically in the high 70s.  Hemoglobin 13.3 in January 2017  CMP Latest Ref Rng & Units 05/18/2020 11/06/2019 11/01/2018  Glucose 70 - 99 mg/dL 247(H) 313(H)  202(H)  BUN 6 - 23 mg/dL 19 18 18   Creatinine 0.40 - 1.50 mg/dL 1.04 1.08 0.92  Sodium 135 - 145 mEq/L 134(L) 132(L) 134(L)  Potassium 3.5 - 5.1 mEq/L 4.3 4.2 3.9  Chloride 96 - 112 mEq/L 101 98 101  CO2 19 - 32 mEq/L 27 25 22   Calcium 8.4 - 10.5 mg/dL 9.5 9.3 8.9  Total Protein 6.0 - 8.3 g/dL 7.5 6.4 6.6  Total Bilirubin 0.2 - 1.2 mg/dL 0.4 0.4 0.3  Alkaline Phos 39 - 117 U/L 54 55 51  AST 0 - 37 U/L 17 16 20   ALT 0 - 53 U/L 29 23 38   Iron/TIBC/Ferritin/ %Sat    Component Value Date/Time   IRON 44 05/18/2020 0917   FERRITIN 70.8 05/18/2020 0917   IRONPCTSAT 10.5 (L) 05/18/2020 0917   Similar iron studies in June 2021, June 2020 and November 2019  B12 normal December 2021, June 2020, November 8413  No folic acid level on file   Assessment: Encounter Diagnoses  Name Primary?  . Iron deficiency anemia, unspecified iron deficiency anemia type Yes  . Microcytic anemia   . Heme positive stool     Chronic microcytic anemia with decreased iron saturation but normal ferritin.  Heme positive stool, and unclear how  long that has been the case.  He currently does not have melena, but certainly needs endoscopic work-up for this anemia and heme positive stool.  Upper endoscopy and colonoscopy warranted.  Unfortunately, he is uninsured and needs help navigating the financial aspect of this.  He did not feel comfortable scheduling the procedures right now, and was given an application and information regarding the financial services at [facility].  Looking at the last several years of lab work, I also wonder if he could have other reasons for anemia such as folic acid deficiency or thalassemia trait.  Plan:  EGD and colonoscopy after some assistance with the financial aspect of that.  CBC, iron studies and folic acid today  Consider hemoglobin electrophoresis and referral to hematology depending upon endoscopy findings.  Thank you for the courtesy of this consult.  Please call me with any questions or concerns.  Nelida Meuse III  CC: Referring provider noted above

## 2020-08-09 NOTE — Patient Instructions (Addendum)
It has been recommended to you by your physician that you have a(n) endoscopy and colonoscopy completed. Per your request, we did not schedule the procedure(s) today. Please contact our office at [phone] should you decide to have the procedure completed. You will be scheduled for a pre-visit and procedure at that time.  Please contact [place] regarding possible financial assistance. It is very important that you get this in place so we can evaluate the reason you are having blood in your stool.  Your provider has requested that you go to the basement level for lab work before leaving today. Press "B" on the elevator. The lab is located at the first door on the left as you exit the elevator.  If you are age 74 or younger, your body mass index should be between 19-25. Your Body mass index is 34.36 kg/m. If this is out of the aformentioned range listed, please consider follow up with your Primary Care Provider.   Due to recent changes in healthcare laws, you may see the results of your imaging and laboratory studies on MyChart before your provider has had a chance to review them.  We understand that in some cases there may be results that are confusing or concerning to you. Not all laboratory results come back in the same time frame and the provider may be waiting for multiple results in order to interpret others.  Please give [place] 48 hours in order for your provider to thoroughly review all the results before contacting the office for clarification of your results.

## 2020-08-26 ENCOUNTER — Ambulatory Visit: Payer: Self-pay | Admitting: Endocrinology

## 2020-08-26 ENCOUNTER — Other Ambulatory Visit: Payer: Self-pay

## 2020-08-26 ENCOUNTER — Encounter: Payer: Self-pay | Admitting: Endocrinology

## 2020-08-26 VITALS — BP 130/70 | HR 56 | Ht 67.0 in | Wt 229.0 lb

## 2020-08-26 DIAGNOSIS — E1142 Type 2 diabetes mellitus with diabetic polyneuropathy: Secondary | ICD-10-CM

## 2020-08-26 LAB — POCT GLYCOSYLATED HEMOGLOBIN (HGB A1C): Hemoglobin A1C: 8.1 % — AB (ref 4.0–5.6)

## 2020-08-26 MED ORDER — INSULIN NPH (HUMAN) (ISOPHANE) 100 UNIT/ML ~~LOC~~ SUSP: 38.0000 [IU] | SUBCUTANEOUS | 11 refills | Status: DC

## 2020-08-26 NOTE — Patient Instructions (Addendum)
check your blood sugar once a day.  vary the time of day when you check, between before the 3 meals, and at bedtime.  also check if you have symptoms of your blood sugar being too high or too low.  please keep a record of the readings and bring it to your next appointment here (or you can bring the meter itself).  You can write it on any piece of paper.  please call us sooner if your blood sugar goes below 70, or if you have a lot of readings over 200. Please increase the insulin to 38 units each morning.  Please continue the same other medications.  On this type of insulin schedule, you should eat meals on a regular schedule (especially lunch).  If a meal is missed or significantly delayed, your blood sugar could go low.   Please come back for a follow-up appointment in 2 months.

## 2020-08-26 NOTE — Progress Notes (Signed)
Subjective:    Patient ID: Steven [name], male    DOB: [birth year], 60 y.o.   MRN: [id number]  HPI Pt returns for f/u of diabetes mellitus: DM type: Insulin-requiring type 2 Dx'ed: 2595 Complications: PN Therapy: 2 oral meds.   DKA: never Severe hypoglycemia: never Pancreatitis: never Pancreatic imaging: never SDOH: he gets discount from [place]; he is intermittently homeless.   Other: he declines multiple daily injections.   Interval history: no cbg record, but states cbg's vary from 170-350.  There is no trend throughout the day.  He never misses the insulin.  pt states he feels well in general. Past Medical History:  Diagnosis Date  . Chicken pox   . Diabetes mellitus without complication ([place])   . Essential hypertension   . GERD (gastroesophageal reflux disease)   . Gouty arthritis   . Hypertension   . Iron deficiency anemia   . Vitamin D deficiency     Past Surgical History:  Procedure Laterality Date  . INGUINAL HERNIA REPAIR Right 12/19/2017   Procedure: HERNIA REPAIR INGUINAL RIGHT;  Surgeon: Coralie Keens, MD;  Location: [facility];  Service: General;  Laterality: Right;  . INSERTION OF MESH Right 12/19/2017   Procedure: INSERTION OF MESH;  Surgeon: Coralie Keens, MD;  Location: [facility];  Service: General;  Laterality: Right;  . MASS EXCISION Right 12/19/2017   Procedure: EXCISION OF RIGHT SCROTAL MASS;  Surgeon: Coralie Keens, MD;  Location: [facility];  Service: General;  Laterality: Right;  . TONSILLECTOMY      Social History   Socioeconomic History  . Marital status: Single    Spouse name: Not on file  . Number of children: 1  . Years of education: 55  . Highest education level: Not on file  Occupational History  . Occupation: Architect  Tobacco Use  . Smoking status: Former Smoker    Packs/day: 2.00    Years: 25.00    Pack years: 50.00    Types: Cigarettes  . Smokeless tobacco: Former Systems developer    Types: Chew  Substance and Sexual Activity  .  Alcohol use: Yes    Comment: occasionally  . Drug use: No  . Sexual activity: Yes    Birth control/protection: Condom  Other Topics Concern  . Not on file  Social History Narrative   Fun: Just working   Investment banker, operational of Radio broadcast assistant Strain: Not on file  Food Insecurity: Not on file  Transportation Needs: Not on file  Physical Activity: Not on file  Stress: Not on file  Social Connections: Not on file  Intimate Partner Violence: Not on file    Current Outpatient Medications on File Prior to Visit  Medication Sig Dispense Refill  . allopurinol (ZYLOPRIM) 100 MG tablet Take 1 tablet (100 mg total) by mouth 2 (two) times daily. 180 tablet 3  . gabapentin (NEURONTIN) 100 MG capsule Take 2 capsules (200 mg total) by mouth at bedtime. 180 capsule 3  . glipiZIDE (GLUCOTROL XL) 10 MG 24 hr tablet TAKE 1 TABLET BY MOUTH TWICE A DAY 180 tablet 3  . hydrochlorothiazide (MICROZIDE) 12.5 MG capsule TAKE 1 CAPSULE BY MOUTH DAILY. ANNUAL APPT DUE IN JUNE MUST SEE PROVIDER FOR FUTURE REFILLS (Patient taking differently: TAKE 1 CAPSULE BY MOUTH DAILY.) 90 capsule 3  . indomethacin (INDOCIN) 25 MG capsule TAKE 1 CAPSULE BY MOUTH TWICE A DAY AS NEEDED 180 capsule 1  . lisinopril (ZESTRIL) 20 MG tablet 1 tab by mouth once daily  90 tablet 3  . lovastatin (MEVACOR) 40 MG tablet TAKE 1 TABLET BY MOUTH AT BEDTIME **ANNUAL APPT DUE IN JUNE,MUST SEE PROVIDER FOR REFILLS* 90 tablet 3  . metFORMIN (GLUCOPHAGE) 1000 MG tablet TAKE 1 TABLET BY MOUTH TWICE A DAY WITH FOOD 180 tablet 1  . pantoprazole (PROTONIX) 40 MG tablet Take 1 tablet (40 mg total) by mouth daily. (Patient taking differently: Take 40 mg by mouth as needed.) 90 tablet 3   No current facility-administered medications on file prior to visit.    No Known Allergies  Family History  Problem Relation Age of Onset  . Heart disease Mother   . Hypertension Mother   . Diabetes Mother   . Stroke Father   . Hypertension Father    . Stroke Brother     BP 130/70   Pulse (!) 56   Ht 5\' 7"  (1.702 m)   Wt 229 lb (103.9 kg)   SpO2 96%   BMI 35.87 kg/m    Review of Systems He denies hypoglycemia    Objective:   Physical Exam VITAL SIGNS:  See vs page GENERAL: no distress Pulses: dorsalis pedis intact bilat.   MSK: no deformity of the feet CV: trace bilat leg edema Skin:  no ulcer on the feet.  normal color and temp on the feet. Neuro: sensation is intact to touch on the feet.  Ext: there is bilateral onychomycosis of the toenails.   Lab Results  Component Value Date   HGBA1C 8.1 (A) 08/26/2020        Assessment & Plan:  Insulin-requiring type 2 DM, with PN: uncontrolled Plan is to phase our glipizide when A1c is better.   Patient Instructions  check your blood sugar once a day.  vary the time of day when you check, between before the 3 meals, and at bedtime.  also check if you have symptoms of your blood sugar being too high or too low.  please keep a record of the readings and bring it to your next appointment here (or you can bring the meter itself).  You can write it on any piece of paper.  please call us sooner if your blood sugar goes below 70, or if you have a lot of readings over 200. Please increase the insulin to 38 units each morning.  Please continue the same other medications.  On this type of insulin schedule, you should eat meals on a regular schedule (especially lunch).  If a meal is missed or significantly delayed, your blood sugar could go low.   Please come back for a follow-up appointment in 2 months.

## 2020-08-31 ENCOUNTER — Emergency Department (HOSPITAL_COMMUNITY)
Admission: EM | Admit: 2020-08-31 | Discharge: 2020-08-31 | Disposition: A | Discharge status: Home / Self Care | Payer: Self-pay | Attending: Emergency Medicine | Admitting: Emergency Medicine

## 2020-08-31 ENCOUNTER — Institutional Professional Consult (permissible substitution): Payer: Self-pay | Admitting: Pulmonary Disease

## 2020-08-31 ENCOUNTER — Emergency Department (HOSPITAL_COMMUNITY): Payer: Self-pay

## 2020-08-31 ENCOUNTER — Encounter (HOSPITAL_COMMUNITY): Payer: Self-pay

## 2020-08-31 ENCOUNTER — Other Ambulatory Visit: Payer: Self-pay

## 2020-08-31 DIAGNOSIS — M25561 Pain in right knee: Secondary | ICD-10-CM

## 2020-08-31 DIAGNOSIS — M1711 Unilateral primary osteoarthritis, right knee: Secondary | ICD-10-CM | POA: Insufficient documentation

## 2020-08-31 DIAGNOSIS — Z794 Long term (current) use of insulin: Secondary | ICD-10-CM | POA: Insufficient documentation

## 2020-08-31 DIAGNOSIS — Z7984 Long term (current) use of oral hypoglycemic drugs: Secondary | ICD-10-CM | POA: Insufficient documentation

## 2020-08-31 DIAGNOSIS — I10 Essential (primary) hypertension: Secondary | ICD-10-CM | POA: Insufficient documentation

## 2020-08-31 DIAGNOSIS — E119 Type 2 diabetes mellitus without complications: Secondary | ICD-10-CM | POA: Insufficient documentation

## 2020-08-31 DIAGNOSIS — Z79899 Other long term (current) drug therapy: Secondary | ICD-10-CM | POA: Insufficient documentation

## 2020-08-31 DIAGNOSIS — Z87891 Personal history of nicotine dependence: Secondary | ICD-10-CM | POA: Insufficient documentation

## 2020-08-31 MED ORDER — DICLOFENAC SODIUM 1 % EX GEL: 2.0000 g | Freq: Four times a day (QID) | CUTANEOUS | 0 refills | Status: AC

## 2020-08-31 MED ORDER — KETOROLAC TROMETHAMINE 60 MG/2ML IM SOLN
60.0000 mg | Freq: Once | INTRAMUSCULAR | Status: AC
  Administered 2020-08-31: 60 mg via INTRAMUSCULAR
  Filled 2020-08-31: qty 2

## 2020-08-31 NOTE — Discharge Instructions (Addendum)
You came to the emergency department today to be evaluated for your right knee pain.  Your x-ray showed no broken bones or dislocations.  Your x-ray did show signs of arthritis.  I have given you a brace and crutches to use as needed.  However it is important that you move that knee as much as possible and if able bear weight on it.  Have given you a prescription for Voltaren gel.  Please apply this gel 4 times daily to your affected knee.    You will need to follow-up with Dr. Tamala Julian at [facility] for further management of your right knee pain.  Get help right away if: You develop severe joint pain, swelling, or redness. Many joints become painful and swollen. You develop severe back pain. You develop severe weakness in your leg. You cannot control your bladder or bowels.

## 2020-08-31 NOTE — ED Triage Notes (Signed)
Emergency Medicine Provider Triage Evaluation Note  Steven [name] , a 59 y.o. male  was evaluated in triage.  Pt complains of pain of right knee.  He has had chronic pain and been being seen by sports medicine.  He reports that today his knee started hurting more especially with weightbearing.  No specific injury.    Physical Exam  BP (!) 146/86 (BP Location: Left Arm)   Pulse 74   Temp 98.1 F (36.7 C) (Oral)   Resp 18   Ht 5\' 7"  (1.702 m)   Wt 104.3 kg   SpO2 99%   BMI 36.02 kg/m  Patient is awake and alert.  In no distress.  Moves and bends knee without significant difficulty.  Medical Decision Making  Medically screening exam initiated at 3:56 PM.  Appropriate orders placed.  Steven [name] was informed that the remainder of the evaluation will be completed by another provider, this initial triage assessment does not replace that evaluation, and the importance of remaining in the ED until their evaluation is complete.     Lorin Glass, [place] 08/31/20 1557

## 2020-08-31 NOTE — ED Provider Notes (Signed)
[facility] DEPT Provider Note   CSN: [id number] Arrival date & time: 08/31/20  1539     History Chief Complaint  Patient presents with  . Knee Pain    Steven [name] is a 60 y.o. male patient presents with a chief complaint of right knee pain.  Patient reports that he has had pain in his right knee the past 3 weeks.  Pain has worsened over the last 2 days.  Patient rates his pain 5/10 on the pain scale while at rest and 10/10 on the pain scale when ambulating.  Patient reports increased pain with weightbearing.  Patient denies any radiation of his pain.  Patient reports minimal improvement with lidocaine patches.  Patient denies any swelling, color change, radiation of pain, numbness or tingling to extremities, weakness to extremities.  Patient denies any injuries.  Patient reports he has had pain in his right knee in the past and has been evaluated orthopedist Dr. Tamala Julian.  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  HPI     Past Medical History:  Diagnosis Date  . Chicken pox   . Diabetes mellitus without complication ([place])   . Essential hypertension   . GERD (gastroesophageal reflux disease)   . Gouty arthritis   . Hypertension   . Iron deficiency anemia   . Vitamin D deficiency     Patient Active Problem List   Diagnosis Date Noted  . HLD (hyperlipidemia) 05/18/2020  . Fatigue 05/18/2020  . Patellofemoral arthritis of left knee 05/06/2020  . Left carpal tunnel syndrome 05/06/2020  . Acute bursitis of left shoulder 03/30/2020  . Diabetes ([place]) 01/29/2020  . Iron deficiency anemia 11/06/2019  . Vitamin D deficiency 11/06/2019  . Hypersomnolence 10/19/2018  . GERD (gastroesophageal reflux disease) 10/18/2018  . Anemia 04/01/2018  . Neck pain 04/01/2018  . Ganglion cyst 03/15/2018  . Acute pain of left knee 03/15/2018  . Sebaceous cyst 03/15/2018  . Mass of right inguinal region s/p excision 12/19/2017 12/21/2017  . Incarcerated right  inguinal hernia s/p repair 12/19/2017 12/18/2017  . Peripheral edema 12/10/2017  . Wellness examination 06/11/2017  . 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 Chest wall pain 07/02/2015  . Essential hypertension 10/14/2014    Past Surgical History:  Procedure Laterality Date  . INGUINAL HERNIA REPAIR Right 12/19/2017   Procedure: HERNIA REPAIR INGUINAL RIGHT;  Surgeon: Coralie Keens, MD;  Location: [facility];  Service: General;  Laterality: Right;  . INSERTION OF MESH Right 12/19/2017   Procedure: INSERTION OF MESH;  Surgeon: Coralie Keens, MD;  Location: [facility];  Service: General;  Laterality: Right;  . MASS EXCISION Right 12/19/2017   Procedure: EXCISION OF RIGHT SCROTAL MASS;  Surgeon: Coralie Keens, MD;  Location: [facility];  Service: General;  Laterality: Right;  . TONSILLECTOMY         Family History  Problem Relation Age of Onset  . Heart disease Mother   . Hypertension Mother   . Diabetes Mother   . Stroke Father   . Hypertension Father   . Stroke Brother     Social History   Tobacco Use  . Smoking status: Former Smoker    Packs/day: 2.00    Years: 25.00    Pack years: 50.00    Types: Cigarettes  . Smokeless tobacco: Former Systems developer    Types: Chew  Substance Use Topics  . Alcohol use: Not Currently    Comment: occasionally  . Drug use: No  Home Medications Prior to Admission medications   Medication Sig Start Date End Date Taking? Authorizing Provider  allopurinol (ZYLOPRIM) 100 MG tablet Take 1 tablet (100 mg total) by mouth 2 (two) times daily. 11/08/19   [name], MD  gabapentin (NEURONTIN) 100 MG capsule Take 2 capsules (200 mg total) by mouth at bedtime. 03/30/20   Lyndal Pulley, DO  glipiZIDE (GLUCOTROL XL) 10 MG 24 hr tablet TAKE 1 TABLET BY MOUTH TWICE A DAY 11/14/19   [name], MD  hydrochlorothiazide (MICROZIDE) 12.5 MG capsule TAKE 1 CAPSULE BY MOUTH DAILY. ANNUAL APPT DUE IN JUNE MUST SEE  PROVIDER FOR FUTURE REFILLS Patient taking differently: TAKE 1 CAPSULE BY MOUTH DAILY. 11/08/19   [name], MD  indomethacin (INDOCIN) 25 MG capsule TAKE 1 CAPSULE BY MOUTH TWICE A DAY AS NEEDED 03/16/20   [name], MD  insulin NPH Human (NOVOLIN N RELION) 100 UNIT/ML injection Inject 0.38 mLs (38 Units total) into the skin every morning. And syringes 1/day 08/26/20   Renato Shin, MD  lisinopril (ZESTRIL) 20 MG tablet 1 tab by mouth once daily 11/08/19   [name], MD  lovastatin (MEVACOR) 40 MG tablet TAKE 1 TABLET BY MOUTH AT BEDTIME **ANNUAL APPT DUE IN [place] SEE PROVIDER FOR REFILLS* 03/30/20   [name], MD  metFORMIN (GLUCOPHAGE) 1000 MG tablet TAKE 1 TABLET BY MOUTH TWICE A DAY WITH FOOD 12/12/19   [name], MD  pantoprazole (PROTONIX) 40 MG tablet Take 1 tablet (40 mg total) by mouth daily. Patient taking differently: Take 40 mg by mouth as needed. 11/08/19   [name], MD    Allergies    Patient has no known allergies.  Review of Systems   Review of Systems  Constitutional: Negative for chills and fever.  Musculoskeletal: Positive for arthralgias and gait problem. Negative for back pain, joint swelling and neck pain.  Skin: Negative for color change, pallor, rash and wound.  Neurological: Negative for weakness and numbness.    Physical Exam Updated Vital Signs BP 113/60 (BP Location: Left Arm)   Pulse 60   Temp 98.1 F (36.7 C) (Oral)   Resp 17   Ht 5\' 7"  (1.702 m)   Wt 104.3 kg   SpO2 95%   BMI 36.02 kg/m   Physical Exam Vitals and nursing note reviewed.  Constitutional:      General: He is not in acute distress.    Appearance: He is not ill-appearing, toxic-appearing or diaphoretic.  HENT:     Head: Normocephalic.  Eyes:     General: No scleral icterus.       Right eye: No discharge.        Left eye: No discharge.  Cardiovascular:     Rate and Rhythm: Normal rate.  Pulmonary:     Effort: Pulmonary effort is normal.   Musculoskeletal:     Cervical back: Normal range of motion and neck supple.     Right hip: No deformity, lacerations, tenderness, bony tenderness or crepitus. Normal range of motion.     Right upper leg: Normal.     Right knee: No swelling, deformity, effusion, erythema, ecchymosis, lacerations, bony tenderness or crepitus. Normal range of motion. Tenderness present. Normal alignment.     Instability Tests: Anterior drawer test negative. Posterior drawer test negative.     Left knee: No swelling, deformity, effusion, erythema, ecchymosis, lacerations, bony tenderness or crepitus. Normal range of motion. No tenderness. Normal alignment.  Right lower leg: Normal.     Left lower leg: Normal.     Right ankle: No swelling, deformity, ecchymosis or lacerations. No tenderness. Normal range of motion. Normal pulse.     Right foot: Normal range of motion and normal capillary refill. No swelling, deformity, tenderness or bony tenderness. Normal pulse.     Comments: Tenderness to right knee just below medial joint line  Pulse, motor, and sensation intact to right foot  Patient able to fully flex and extend right knee; increased pain with range of motion  No palpable defect noted to quadriceps or patellar tendon.  Skin:    General: Skin is warm and dry.     Coloration: Skin is not jaundiced or pale.     Findings: No bruising, erythema or rash.  Neurological:     General: No focal deficit present.     Mental Status: He is alert.     Comments: Patient ambulates with antalgic gait due to complaint of right knee pain  Psychiatric:        Behavior: Behavior is cooperative.     ED Results / Procedures / Treatments   Labs (all labs ordered are listed, but only abnormal results are displayed) Labs Reviewed - No data to display  EKG None  Radiology DG Knee Complete 4 Views Right  Result Date: 08/31/2020 CLINICAL DATA:  Right knee pain. EXAM: RIGHT KNEE - COMPLETE 4+ VIEW COMPARISON:  None.  FINDINGS: There are moderate tricompartmental degenerative changes of the knee, greatest within the medial and patellofemoral compartments. There is no large joint effusion. There is no acute displaced fracture. There is a metallic foreign body projecting over the medial tibial plateau, likely related to prior surgical intervention. 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 No acute displaced fracture or large joint effusion. Electronically Signed   By: Constance Holster M.D.   On: 08/31/2020 19:23    Procedures Procedures   Medications Ordered in ED Medications  ketorolac (TORADOL) injection 60 mg (60 mg Intramuscular Given 08/31/20 1833)    ED Course  I have reviewed the triage vital signs and the nursing notes.  Pertinent labs & imaging results that were available during my care of the patient were reviewed by me and considered in my medical decision making (see chart for details).    MDM Rules/Calculators/A&P                          Alert 60 year old male no acute distress, nontoxic appearing.  Patient presents with complaint of right knee pain.  Pain has been present over the last 3 weeks however worsening of the last 2 days.  Patient reports increased pain with ambulation and weightbearing.  Patient reports he had previously seen a "sports medicine doctor," for pain in his right knee.  Per chart review patient was seen by Dr. Tamala Julian on 05/06/2020.  Patient was was to follow-up in 2 months but patient reports he never did.  On physical exam patient has tenderness to right knee just below medial joint line, patient able to fully flex and extend right knee.  No palpable defect noted to quadriceps or patella tendon.  No swelling, deformity, effusion, erythema, ecchymosis negative anterior and posterior drawer test.  Patient complains of increased pain with range of motion.  Patient noted to have antalgic gait with ambulation due to complaints of right knee  pain.  No signs of septic joint.    We will give  patient Toradol injection and obtain x-ray imaging of right knee. X-ray imaging shows moderate tricompartmental degenerative changes of the right knee, greatest within the medial and patellofemoral components.  No acute displaced fracture or large joint effusion.  Patient reports minimal improvement with Toradol injection.  Will give patient right knee sleeve and crutches.  Patient prescribed Voltaren gel.  Patient advised to follow-up orthopedist Dr. Tamala Julian.  Discussed results, findings, treatment and follow up. Patient advised of return precautions. Patient verbalized understanding and agreed with plan.    Final Clinical Impression(s) / ED Diagnoses Final diagnoses:  Acute pain of right knee  Arthritis of right knee    Rx / DC Orders ED Discharge Orders         Ordered    diclofenac Sodium (VOLTAREN) 1 % GEL  4 times daily        08/31/20 2042           Dyann Ruddle 09/01/20 0115    Quintella Reichert, MD 09/04/20 [phone]

## 2020-08-31 NOTE — ED Triage Notes (Signed)
Pt c/o rt knee pain for several months, denies injury or trauma. States his PCP advised him to try arthritic cream, but not helping

## 2020-09-01 NOTE — Progress Notes (Signed)
[facility] [address] [place] Phone: [phone] Subjective:   I Steven [name] am serving as a Education administrator for Dr. Hulan Saas.  This visit occurred during the SARS-CoV-2 public health emergency.  Safety protocols were in place, including screening questions prior to the visit, additional usage of staff PPE, and extensive cleaning of exam room while observing appropriate contact time as indicated for disinfecting solutions.   I'm seeing this patient by the request  of:  [name], MD  CC: Right knee pain  [id number]  Steven [name] is a 60 y.o. male coming in with complaint of R knee pain. Was seen in ED for R knee pain on 08/31/2020. Previously seen for L knee pain. Patient states he has medial knee pain. Patient has knee brace from the hospital on today.   Onset- Chronic  Location - medial Duration-  Character- achy Aggravating factors- walking, sitting, flexion  Reliving factors-  Therapies tried- heating pads  Severity-  7-8/10 at its worse     Right knee x-rays in the emergency room were independently visualized by me showing moderate tricompartmental osteoarthritic changes.  Patient did have some type of surgical intervention previously.  Past Medical History:  Diagnosis Date  . Chicken pox   . Diabetes mellitus without complication ([place])   . Essential hypertension   . GERD (gastroesophageal reflux disease)   . Gouty arthritis   . Hypertension   . Iron deficiency anemia   . Vitamin D deficiency    Past Surgical History:  Procedure Laterality Date  . INGUINAL HERNIA REPAIR Right 12/19/2017   Procedure: HERNIA REPAIR INGUINAL RIGHT;  Surgeon: Coralie Keens, MD;  Location: [facility];  Service: General;  Laterality: Right;  . INSERTION OF MESH Right 12/19/2017   Procedure: INSERTION OF MESH;  Surgeon: Coralie Keens, MD;  Location: [facility];  Service: General;  Laterality: Right;  . MASS EXCISION Right 12/19/2017    Procedure: EXCISION OF RIGHT SCROTAL MASS;  Surgeon: Coralie Keens, MD;  Location: [facility];  Service: General;  Laterality: Right;  . TONSILLECTOMY     Social History   Socioeconomic History  . Marital status: Single    Spouse name: Not on file  . Number of children: 1  . Years of education: 43  . Highest education level: Not on file  Occupational History  . Occupation: Architect  Tobacco Use  . Smoking status: Former Smoker    Packs/day: 2.00    Years: 25.00    Pack years: 50.00    Types: Cigarettes  . Smokeless tobacco: Former Systems developer    Types: Chew  Substance and Sexual Activity  . Alcohol use: Not Currently    Comment: occasionally  . Drug use: No  . Sexual activity: Yes    Birth control/protection: Condom  Other Topics Concern  . Not on file  Social History Narrative   Fun: Just working   Social Determinants of Radio broadcast assistant Strain: Not on file  Food Insecurity: Not on file  Transportation Needs: Not on file  Physical Activity: Not on file  Stress: Not on file  Social Connections: Not on file   No Known Allergies Family History  Problem Relation Age of Onset  . Heart disease Mother   . Hypertension Mother   . Diabetes Mother   . Stroke Father   . Hypertension Father   . Stroke Brother     Current Outpatient Medications (Endocrine & Metabolic):  .  glipiZIDE (GLUCOTROL XL) 10 MG 24 hr tablet, TAKE 1 TABLET BY MOUTH TWICE A DAY .  insulin NPH Human (NOVOLIN N RELION) 100 UNIT/ML injection, Inject 0.38 mLs (38 Units total) into the skin every morning. And syringes 1/day .  metFORMIN (GLUCOPHAGE) 1000 MG tablet, TAKE 1 TABLET BY MOUTH TWICE A DAY WITH FOOD  Current Outpatient Medications (Cardiovascular):  .  hydrochlorothiazide (MICROZIDE) 12.5 MG capsule, TAKE 1 CAPSULE BY MOUTH DAILY. ANNUAL APPT DUE IN JUNE MUST SEE PROVIDER FOR FUTURE REFILLS (Patient taking differently: TAKE 1 CAPSULE BY MOUTH DAILY.) .  lisinopril (ZESTRIL) 20 MG  tablet, 1 tab by mouth once daily .  lovastatin (MEVACOR) 40 MG tablet, TAKE 1 TABLET BY MOUTH AT BEDTIME **ANNUAL APPT DUE IN [place]UN[place],MUST SEE PROVIDER FOR REFILLS*   Current Outpatient Medications (Analgesics):  .  allopurinol (ZYLOPRIM) 100 MG tablet, Take 1 tablet (100 mg total) by mouth 2 (two) times daily.   Current Outpatient Medications (Other):  .  diclofenac Sodium (VOLTAREN) 1 % GEL, Apply 2 g topically 4 (four) times daily. Marland Kitchen  gabapentin (NEURONTIN) 100 MG capsule, Take 2 capsules (200 mg total) by mouth at bedtime. .  pantoprazole (PROTONIX) 40 MG tablet, Take 1 tablet (40 mg total) by mouth daily. (Patient taking differently: Take 40 mg by mouth as needed.)   Reviewed prior external information including notes and imaging from  primary care provider As well as notes that were available from care everywhere and other healthcare systems.  Past medical history, social, surgical and family history all reviewed in electronic medical record.  No pertanent information unless stated regarding to the chief complaint.   Review of Systems:  No headache, visual changes, nausea, vomiting, diarrhea, constipation, dizziness, abdominal pain, skin rash, fevers, chills, night sweats, weight loss, swollen lymph nodes, body aches, joint swelling, chest pain, shortness of breath, mood changes. POSITIVE muscle aches  Objective  Blood pressure 132/74, pulse (!) 58, height 5\' 7"  (1.702 m), weight 230 lb (104.3 kg), SpO2 98 %.   General: No apparent distress alert and oriented x3 mood and affect normal, dressed appropriately.  HEENT: Pupils equal, extraocular movements intact  Respiratory: Patient's speak in full sentences and does not appear short of breath  Cardiovascular: No lower extremity edema, non tender, no erythema  Gait severely antalgic Right knee exam does have some instability noted with valgus and varus force.  Patient does have limited flexion of the knee.  Mild crepitus of the  patella noted.  Tenderness over the medial joint space.  Positive McMurray's  Limited musculoskeletal ultrasound was performed and interpreted by Lyndal Pulley  Limited ultrasound of patient's right knee shows the patient does have effusion noted of the patellofemoral joint.  Narrowing of the patellofemoral and the medial joint space.  Patient does have what appears to be an acute meniscal tear with 25% displacement of the posterior medial aspect.  Hypoechoic changes surrounding this area. Impression: Knee arthritis with meniscal tear  After informed written and verbal consent, patient was seated on exam table. Right knee was prepped with alcohol swab and utilizing anterolateral approach, patient's right knee space was injected with 4:1  marcaine 0.5%: Kenalog 40mg /dL. Patient tolerated the procedure well without immediate complications.     Impression and Recommendations:     The above documentation has been reviewed and is accurate and complete Lyndal Pulley, DO

## 2020-09-02 ENCOUNTER — Ambulatory Visit (INDEPENDENT_AMBULATORY_CARE_PROVIDER_SITE_OTHER): Payer: Self-pay | Admitting: Family Medicine

## 2020-09-02 ENCOUNTER — Encounter: Payer: Self-pay | Admitting: Family Medicine

## 2020-09-02 ENCOUNTER — Ambulatory Visit: Payer: Self-pay

## 2020-09-02 ENCOUNTER — Other Ambulatory Visit: Payer: Self-pay

## 2020-09-02 VITALS — BP 132/74 | HR 58 | Ht 67.0 in | Wt 230.0 lb

## 2020-09-02 DIAGNOSIS — M25561 Pain in right knee: Secondary | ICD-10-CM

## 2020-09-02 DIAGNOSIS — G8929 Other chronic pain: Secondary | ICD-10-CM

## 2020-09-02 DIAGNOSIS — M1711 Unilateral primary osteoarthritis, right knee: Secondary | ICD-10-CM | POA: Insufficient documentation

## 2020-09-02 NOTE — Patient Instructions (Addendum)
Good to see you Ice 20 minutes 2 times daily. Usually after activity and before bed. Exercises 3 times a week.  Injected the knee today  Seems to be arthritis and a meniscal tear  Out of work til Monday and then wear the brace  See me again in 3 weeks

## 2020-09-02 NOTE — Assessment & Plan Note (Signed)
Patient given injection today.  Tolerated the procedure well.  Patient's knee does have moderate arthritic changes but does seem to have a medial meniscal tear also noted.  Discussed with patient about icing regimen, home exercises, which activities to do which wants to avoid.  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  Avoid twisting motions if possible.  Hoping patient will make some improvement in the near future.  Follow-up again in 3 weeks

## 2020-09-05 ENCOUNTER — Other Ambulatory Visit: Payer: Self-pay | Admitting: Internal Medicine

## 2020-09-14 ENCOUNTER — Ambulatory Visit: Payer: Self-pay | Admitting: Family Medicine

## 2020-09-14 ENCOUNTER — Other Ambulatory Visit: Payer: Self-pay | Admitting: Internal Medicine

## 2020-09-14 DIAGNOSIS — M25471 Effusion, right ankle: Secondary | ICD-10-CM

## 2020-09-14 DIAGNOSIS — M109 Gout, unspecified: Secondary | ICD-10-CM

## 2020-09-16 ENCOUNTER — Other Ambulatory Visit: Payer: Self-pay

## 2020-09-16 ENCOUNTER — Ambulatory Visit (INDEPENDENT_AMBULATORY_CARE_PROVIDER_SITE_OTHER): Payer: Self-pay | Admitting: Internal Medicine

## 2020-09-16 ENCOUNTER — Encounter: Payer: Self-pay | Admitting: Internal Medicine

## 2020-09-16 VITALS — BP 122/76 | HR 49 | Temp 97.8°F | Ht 67.0 in | Wt 231.0 lb

## 2020-09-16 DIAGNOSIS — E1142 Type 2 diabetes mellitus with diabetic polyneuropathy: Secondary | ICD-10-CM

## 2020-09-16 DIAGNOSIS — I10 Essential (primary) hypertension: Secondary | ICD-10-CM

## 2020-09-16 DIAGNOSIS — I872 Venous insufficiency (chronic) (peripheral): Secondary | ICD-10-CM

## 2020-09-16 NOTE — Patient Instructions (Signed)
Please continue all other medications as before, and refills have been done if requested.  Please have the pharmacy call with any other refills you may need.  Please continue your efforts at being more active, low cholesterol diet, and weight control.  Please keep your appointments with your specialists as you may have planned  Please make an Appointment to return in 6 months, or sooner if needed 

## 2020-09-16 NOTE — Assessment & Plan Note (Signed)
Stable, to continiue leg elevtion, ow salt, wt control, compression stokcings

## 2020-09-16 NOTE — Progress Notes (Signed)
Patient ID: Steven [name], male   DOB: 1960-12-11, 60 y.o.   MRN: [id number]        Chief Complaint: follow up right knee pain, htn,dm       HPI:  Steven [name] is a 60 y.o. male here with c/o improved right knee meniscal tear pain recently, has crutches and has to use them after about 2 pm every day being up and walking on the knee at work; no overt bleeding, stil plans to f/u with GI at some point but was told EGD and colonoscopy would be about $5000 which he does not have now.  Pt denies chest pain, increased sob or doe, wheezing, orthopnea, PND, increased LE swelling, palpitations, dizziness or syncope.   Pt denies polydipsia, polyuria,        Wt Readings from Last 3 Encounters:  09/16/20 231 lb (104.8 kg)  09/02/20 230 lb (104.3 kg)  08/31/20 230 lb (104.3 kg)   BP Readings from Last 3 Encounters:  09/16/20 122/76  09/02/20 132/74  08/31/20 113/60         Past Medical History:  Diagnosis Date  . Chicken pox   . Diabetes mellitus without complication ([place])   . Essential hypertension   . GERD (gastroesophageal reflux disease)   . Gouty arthritis   . Hypertension   . Iron deficiency anemia   . Vitamin D deficiency    Past Surgical History:  Procedure Laterality Date  . INGUINAL HERNIA REPAIR Right 12/19/2017   Procedure: HERNIA REPAIR INGUINAL RIGHT;  Surgeon: Coralie Keens, MD;  Location: [facility];  Service: General;  Laterality: Right;  . INSERTION OF MESH Right 12/19/2017   Procedure: INSERTION OF MESH;  Surgeon: Coralie Keens, MD;  Location: [facility];  Service: General;  Laterality: Right;  . MASS EXCISION Right 12/19/2017   Procedure: EXCISION OF RIGHT SCROTAL MASS;  Surgeon: Coralie Keens, MD;  Location: [facility];  Service: General;  Laterality: Right;  . TONSILLECTOMY      reports that he has quit smoking. His smoking use included cigarettes. He has a 50.00 pack-year smoking history. He has quit using smokeless tobacco.  His smokeless tobacco use included chew. He  reports previous alcohol use. He reports that he does not use drugs. family history includes Diabetes in his mother; Heart disease in his mother; Hypertension in his father and mother; Stroke in his brother and father. No Known Allergies Current Outpatient Medications on File Prior to Visit  Medication Sig Dispense Refill  . allopurinol (ZYLOPRIM) 100 MG tablet Take 1 tablet (100 mg total) by mouth 2 (two) times daily. 180 tablet 3  . cyclobenzaprine (FLEXERIL) 5 MG tablet TAKE 1 TABLET BY MOUTH THREE TIMES A DAY AS NEEDED FOR MUSCLE SPASMS 30 tablet 5  . diclofenac Sodium (VOLTAREN) 1 % GEL Apply 2 g topically 4 (four) times daily. 50 g 0  . gabapentin (NEURONTIN) 100 MG capsule Take 2 capsules (200 mg total) by mouth at bedtime. 180 capsule 3  . glipiZIDE (GLUCOTROL XL) 10 MG 24 hr tablet TAKE 1 TABLET BY MOUTH TWICE A DAY 180 tablet 3  . hydrochlorothiazide (MICROZIDE) 12.5 MG capsule TAKE 1 CAPSULE BY MOUTH DAILY. ANNUAL APPT DUE IN JUNE MUST SEE PROVIDER FOR FUTURE REFILLS (Patient taking differently: TAKE 1 CAPSULE BY MOUTH DAILY.) 90 capsule 3  . insulin NPH Human (NOVOLIN N RELION) 100 UNIT/ML injection Inject 0.38 mLs (38 Units total) into the skin every morning. And syringes 1/day 10 mL 11  . lisinopril (ZESTRIL)  20 MG tablet 1 tab by mouth once daily 90 tablet 3  . lovastatin (MEVACOR) 40 MG tablet TAKE 1 TABLET BY MOUTH AT BEDTIME **ANNUAL APPT DUE IN JUNE,MUST SEE PROVIDER FOR REFILLS* 90 tablet 3  . metFORMIN (GLUCOPHAGE) 1000 MG tablet TAKE 1 TABLET BY MOUTH TWICE A DAY WITH FOOD 180 tablet 1  . pantoprazole (PROTONIX) 40 MG tablet Take 1 tablet (40 mg total) by mouth daily. (Patient taking differently: Take 40 mg by mouth as needed.) 90 tablet 3  . traMADol (ULTRAM) 50 MG tablet Take 50 mg by mouth every 6 (six) hours as needed.     No current facility-administered medications on file prior to visit.        ROS:  All others reviewed and negative.  Objective        PE:  BP  122/76 (BP Location: Left Arm, Patient Position: Sitting, Cuff Size: Large)   Pulse (!) 49   Temp 97.8 F (36.6 C) (Oral)   Ht 5\' 7"  (1.702 m)   Wt 231 lb (104.8 kg)   SpO2 98%   BMI 36.18 kg/m                 Constitutional: Pt appears in NAD               HENT: Head: NCAT.                Right Ear: External ear normal.                 Left Ear: External ear normal.                Eyes: . Pupils are equal, round, and reactive to light. Conjunctivae and EOM are normal               Nose: without d/c or deformity               Neck: Neck supple. Gross normal ROM               Cardiovascular: Normal rate and regular rhythm.                 Pulmonary/Chest: Effort normal and breath sounds without rales or wheezing.                Abd:  Soft, NT, ND, + BS, no organomegaly               Neurological: Pt is alert. At baseline orientation, motor grossly intact               Skin: Skin is warm. No rashes, no other new lesions, LE edema - none              Psychiatric: Pt behavior is normal without agitation   Micro: none  Cardiac tracings I have personally interpreted today:  none  Pertinent Radiological findings (summarize): none   Lab Results  Component Value Date   WBC 6.6 08/09/2020   HGB 12.0 (L) 08/09/2020   HCT 37.9 (L) 08/09/2020   PLT 336.0 08/09/2020   GLUCOSE 247 (H) 05/18/2020   CHOL 136 05/18/2020   TRIG 147.0 05/18/2020   HDL 38.20 (L) 05/18/2020   LDLCALC 68 05/18/2020   ALT 29 05/18/2020   AST 17 05/18/2020   NA 134 (L) 05/18/2020   K 4.3 05/18/2020   CL 101 05/18/2020   CREATININE 1.04 05/18/2020   BUN 19 05/18/2020   CO2 27 05/18/2020  TSH 2.19 05/18/2020   PSA 0.52 11/06/2019   HGBA1C 8.1 (A) 08/26/2020   MICROALBUR <0.7 11/06/2019   Assessment/Plan:  Steven [name] is a 60 y.o. Black or African American [2] male with  has a past medical history of Chicken pox, Diabetes mellitus without complication ([place]), Essential hypertension, GERD  (gastroesophageal reflux disease), Gouty arthritis, Hypertension, Iron deficiency anemia, and Vitamin D deficiency.  Venous insufficiency Stable, to continiue leg elevtion, ow salt, wt control, compression stokcings  Essential hypertension BP Readings from Last 3 Encounters:  09/16/20 122/76  09/02/20 132/74  08/31/20 113/60   Stable, pt to continue medical treatment  - hct refilled   Diabetes ([place]) Lab Results  Component Value Date   HGBA1C 8.1 (A) 08/26/2020   Stable, pt to continue current medical treatment metformin, glipizide, nph   Followup: Return in about 1 year (around 09/16/2021).  Cathlean Cower, MD 09/19/2020 4:19 PM [facility]

## 2020-09-19 ENCOUNTER — Encounter: Payer: Self-pay | Admitting: Internal Medicine

## 2020-09-19 NOTE — Assessment & Plan Note (Signed)
Lab Results  Component Value Date   HGBA1C 8.1 (A) 08/26/2020   Stable, pt to continue current medical treatment metformin, glipizide, nph

## 2020-09-19 NOTE — Assessment & Plan Note (Signed)
BP Readings from Last 3 Encounters:  09/16/20 122/76  09/02/20 132/74  08/31/20 113/60   Stable, pt to continue medical treatment  - hct refilled

## 2020-09-21 NOTE — Progress Notes (Signed)
[place] [address] [place] Phone: [phone] Subjective:   I Steven [name] am serving as a Education administrator for Dr. Hulan Saas.  This visit occurred during the SARS-CoV-2 public health emergency.  Safety protocols were in place, including screening questions prior to the visit, additional usage of staff PPE, and extensive cleaning of exam room while observing appropriate contact time as indicated for disinfecting solutions.   I'm seeing this patient by the request  of:  [name], MD  CC: Knee pain follow-up  EPP:[id number]   09/02/2020 Patient given injection today.  Tolerated the procedure well.  Patient's knee does have moderate arthritic changes but does seem to have a medial meniscal tear also noted.  Discussed with patient about icing regimen, home exercises, which activities to do which wants to avoid.  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  Avoid twisting motions if possible.  Hoping patient will make some improvement in the near future.  Follow-up again in 3 weeks  Update 09/22/2020 Steven [name] is a 60 y.o. male coming in with complaint of R knee pain. Patient states he is feeling good and making progress.  States he is feeling 85% better.  Feels that Concerta is very helpful.  Patient is able to walk a lot more regular.  No significant difficulties at this time.     Past Medical History:  Diagnosis Date  . Chicken pox   . Diabetes mellitus without complication ([place])   . Essential hypertension   . GERD (gastroesophageal reflux disease)   . Gouty arthritis   . Hypertension   . Iron deficiency anemia   . Vitamin D deficiency    Past Surgical History:  Procedure Laterality Date  . INGUINAL HERNIA REPAIR Right 12/19/2017   Procedure: HERNIA REPAIR INGUINAL RIGHT;  Surgeon: Coralie Keens, MD;  [facility];  Service: General;  Laterality:  Right;  . INSERTION OF MESH Right 12/19/2017   Procedure: INSERTION OF MESH;  Surgeon: Coralie Keens, MD;  [facility];  Service: General;  Laterality: Right;  . MASS EXCISION Right 12/19/2017   Procedure: EXCISION OF RIGHT SCROTAL MASS;  Surgeon: Coralie Keens, MD;  [facility];  Service: General;  Laterality: Right;  . TONSILLECTOMY     Social History   Socioeconomic History  . Marital status: Single    Spouse name: Not on file  . Number of children: 1  . Years of education: 23  . Highest education level: Not on file  Occupational History  . Occupation: Architect  Tobacco Use  . Smoking status: Former Smoker    Packs/day: 2.00    Years: 25.00    Pack years: 50.00    Types: Cigarettes  . Smokeless tobacco: Former Systems developer    Types: Chew  Substance and Sexual Activity  . Alcohol use: Not Currently    Comment: occasionally  . Drug use: No  . Sexual activity: Yes    Birth control/protection: Condom  Other Topics Concern  . Not on file  Social History Narrative   Fun: Just working   Investment banker, operational of Radio broadcast assistant Strain: Not on file  Food Insecurity: Not on file  Transportation Needs: Not on file  Physical Activity: Not on file  Stress: Not on file  Social Connections: Not on file   No Known Allergies Family History  Problem Relation Age  of Onset  . Heart disease Mother   . Hypertension Mother   . Diabetes Mother   . Stroke Father   . Hypertension Father   . Stroke Brother     Current Outpatient Medications (Endocrine & Metabolic):  .  glipiZIDE (GLUCOTROL XL) 10 MG 24 hr tablet, TAKE 1 TABLET BY MOUTH TWICE A DAY .  insulin NPH Human (NOVOLIN N RELION) 100 UNIT/ML injection, Inject 0.38 mLs (38 Units total) into the skin every morning. And syringes 1/day .  metFORMIN (GLUCOPHAGE) 1000 MG tablet, TAKE 1 TABLET BY MOUTH TWICE A DAY WITH FOOD  Current Outpatient Medications (Cardiovascular):  .  hydrochlorothiazide  (MICROZIDE) 12.5 MG capsule, TAKE 1 CAPSULE BY MOUTH DAILY. ANNUAL APPT DUE IN JUNE MUST SEE PROVIDER FOR FUTURE REFILLS (Patient taking differently: TAKE 1 CAPSULE BY MOUTH DAILY.) .  lisinopril (ZESTRIL) 20 MG tablet, 1 tab by mouth once daily .  lovastatin (MEVACOR) 40 MG tablet, TAKE 1 TABLET BY MOUTH AT BEDTIME **ANNUAL APPT DUE IN JUNE,MUST SEE PROVIDER FOR REFILLS*   Current Outpatient Medications (Analgesics):  .  allopurinol (ZYLOPRIM) 100 MG tablet, Take 1 tablet (100 mg total) by mouth 2 (two) times daily. .  traMADol (ULTRAM) 50 MG tablet, Take 50 mg by mouth every 6 (six) hours as needed.   Current Outpatient Medications (Other):  .  cyclobenzaprine (FLEXERIL) 5 MG tablet, TAKE 1 TABLET BY MOUTH THREE TIMES A DAY AS NEEDED FOR MUSCLE SPASMS .  diclofenac Sodium (VOLTAREN) 1 % GEL, Apply 2 g topically 4 (four) times daily. Marland Kitchen  gabapentin (NEURONTIN) 100 MG capsule, Take 2 capsules (200 mg total) by mouth at bedtime. .  pantoprazole (PROTONIX) 40 MG tablet, Take 1 tablet (40 mg total) by mouth daily. (Patient taking differently: Take 40 mg by mouth as needed.)   Reviewed prior external information including notes and imaging from  primary care provider As well as notes that were available from care everywhere and other healthcare systems.  Past medical history, social, surgical and family history all reviewed in electronic medical record.  No pertanent information unless stated regarding to the chief complaint.   Review of Systems:  No headache, visual changes, nausea, vomiting, diarrhea, constipation, dizziness, abdominal pain, skin rash, fevers, chills, night sweats, weight loss, swollen lymph nodes, body aches, joint swelling, chest pain, shortness of breath, mood changes. POSITIVE muscle aches  Objective  Blood pressure 130/66, pulse 72, height 5\' 7"  (1.702 m), weight 227 lb (103 kg), SpO2 98 %.   General: No apparent distress alert and oriented x3 mood and affect normal,  dressed appropriately.  HEENT: Pupils equal, extraocular movements intact  Respiratory: Patient's speak in full sentences and does not appear short of breath  Cardiovascular: No lower extremity edema, non tender, no erythema  Gait mild antalgic.  Right knee exam still has some mild grinding noted of the patella.  Mild instability noted with valgus and varus force.  Full range of motion though noted today.  Mild positive McMurray's.    Impression and Recommendations:    The above documentation has been reviewed and is accurate and complete Lyndal Pulley, DO

## 2020-09-22 ENCOUNTER — Encounter: Payer: Self-pay | Admitting: Family Medicine

## 2020-09-22 ENCOUNTER — Ambulatory Visit (INDEPENDENT_AMBULATORY_CARE_PROVIDER_SITE_OTHER): Payer: Self-pay | Admitting: Family Medicine

## 2020-09-22 ENCOUNTER — Other Ambulatory Visit: Payer: Self-pay

## 2020-09-22 DIAGNOSIS — M1711 Unilateral primary osteoarthritis, right knee: Secondary | ICD-10-CM

## 2020-09-22 NOTE — Assessment & Plan Note (Signed)
Patient responded very well to the injection.  Do not feel that viscosupplementation is necessary at this time.  Discussed continuing the compression, continue the icing and home exercises as well as the topical anti-inflammatories.  Patient can follow-up with me as needed

## 2020-09-22 NOTE — Patient Instructions (Addendum)
Good to see you Doing well [place] See me again when you need me

## 2020-10-01 ENCOUNTER — Other Ambulatory Visit: Payer: Self-pay | Admitting: Internal Medicine

## 2020-10-01 ENCOUNTER — Other Ambulatory Visit: Payer: Self-pay | Admitting: Family Medicine

## 2020-10-01 DIAGNOSIS — E1165 Type 2 diabetes mellitus with hyperglycemia: Secondary | ICD-10-CM

## 2020-10-01 DIAGNOSIS — I10 Essential (primary) hypertension: Secondary | ICD-10-CM

## 2020-10-01 DIAGNOSIS — M109 Gout, unspecified: Secondary | ICD-10-CM

## 2020-10-01 DIAGNOSIS — M25471 Effusion, right ankle: Secondary | ICD-10-CM

## 2020-10-01 NOTE — Telephone Encounter (Signed)
Left message for patient regarding using 2000IU daily instead of prescription strength.

## 2020-10-01 NOTE — Telephone Encounter (Signed)
For all except the indocin   Please refill as per office routine med refill policy (all routine meds refilled for 3 mo or monthly per pt preference up to one year from last visit, then month to month grace period for 3 mo, then further med refills will have to be denied)

## 2020-10-01 NOTE — Telephone Encounter (Signed)
Tramadol  Last Visit: 09/16/20 Next Visit: 03/23/21 Last Refill: 09/05/20 Please Advise; PMP done

## 2020-10-14 ENCOUNTER — Encounter: Payer: Self-pay | Admitting: Family Medicine

## 2020-10-14 ENCOUNTER — Ambulatory Visit (INDEPENDENT_AMBULATORY_CARE_PROVIDER_SITE_OTHER): Payer: Self-pay | Admitting: Family Medicine

## 2020-10-14 ENCOUNTER — Other Ambulatory Visit: Payer: Self-pay

## 2020-10-14 DIAGNOSIS — M1711 Unilateral primary osteoarthritis, right knee: Secondary | ICD-10-CM

## 2020-10-14 NOTE — Assessment & Plan Note (Signed)
Patient given injection today.  Tolerated the procedure well.  We discussed with patient about the possibility of viscosupplementation consider the patient insurance he is unable to afford it.  Patient is a diabetic and will be watching his blood sugars.  Patient has had difficulty he stated last week where he was having blood sugars in the 300s but now back to the 200s.  Patient does have a follow-up with his endocrinologist in the near future.  Patient was told though if any increase in blood sugars to seek medical attention immediately.  Discussed staying well-hydrated as well.  Athletic trainer was in the room.  Patient was understanding.  We discussed otherwise with the potential for the meniscal tear I do feel that advanced imaging with an MRI would be beneficial.  Would need to consider those secondary to patient also not having insurance coverage and patient would like to avoid surgical intervention.  Patient will be following up with me again in 4 to 6 weeks

## 2020-10-14 NOTE — Patient Instructions (Addendum)
Good to see you We will inject knee Due to insurance this is difficult I am concerned about mensicus but will need MRI to further evaluate Hopefully injections will help Please check your blood sugars daily If you get too high you need to be seen in the ER Keep appointment with endocrinologist Stay hydrated See me again in 5-6 weeks

## 2020-10-14 NOTE — Progress Notes (Signed)
[place] [address] [place] Phone: [phone] Subjective:   I Kandace Blitz am serving as a Education administrator for Dr. Hulan Saas.  This visit occurred during the SARS-CoV-2 public health emergency.  Safety protocols were in place, including screening questions prior to the visit, additional usage of staff PPE, and extensive cleaning of exam room while observing appropriate contact time as indicated for disinfecting solutions.   I'm seeing this patient by the request  of:  [name], MD  CC: right knee pain   HGD:[id number]   09/22/2020 Patient responded very well to the injection.  Do not feel that viscosupplementation is necessary at this time.  Discussed continuing the compression, continue the icing and home exercises as well as the topical anti-inflammatories.  Patient can follow-up with me as needed  10/14/2020 Ajax Schroll is a 60 y.o. male coming in with complaint of right knee pain. Patient states his knee has been acting up and thought he would have to use crutches again.  Patient has had difficulty with his right knee previously with moderate arthritic changes, likely meniscal tear, as well as the history of gout.  Patient does not have insurance.  We have discussed the possibility of viscosupplementation but patient is unable to afford it.  Starting to have pain that is affecting daily activities again.  Affecting the activity that he can do on a regular basis.  Patient states even at night sometimes gives him trouble and had to use the crutch for a short amount of time.    Past Medical History:  Diagnosis Date  . Chicken pox   . Diabetes mellitus without complication ([place])   . Essential hypertension   . GERD (gastroesophageal reflux disease)   . Gouty arthritis   . Hypertension   . Iron deficiency anemia   . Vitamin D deficiency    Past Surgical History:  Procedure Laterality Date  . INGUINAL HERNIA REPAIR Right  12/19/2017   Procedure: HERNIA REPAIR INGUINAL RIGHT;  Surgeon: Coralie Keens, MD;  [facility];  Service: General;  Laterality: Right;  . INSERTION OF MESH Right 12/19/2017   Procedure: INSERTION OF MESH;  Surgeon: Coralie Keens, MD;  [facility];  Service: General;  Laterality: Right;  . MASS EXCISION Right 12/19/2017   Procedure: EXCISION OF RIGHT SCROTAL MASS;  Surgeon: Coralie Keens, MD;  Location: [facility];  Service: General;  Laterality: Right;  . TONSILLECTOMY     Social History   Socioeconomic History  . Marital status: Single    Spouse name: Not on file  . Number of children: 1  . Years of education: 55  . Highest education level: Not on file  Occupational History  . Occupation: Architect  Tobacco Use  . Smoking status: Former Smoker    Packs/day: 2.00    Years: 25.00    Pack years: 50.00    Types: Cigarettes  . Smokeless tobacco: Former Systems developer    Types: Chew  Substance and Sexual Activity  . Alcohol use: Not Currently    Comment: occasionally  . Drug use: No  . Sexual activity: Yes    Birth control/protection: Condom  Other Topics Concern  . Not on file  Social History Narrative   Fun: Just working   Social Determinants of Radio broadcast assistant Strain: Not on file  Food Insecurity: Not on file  Transportation Needs: Not on file  Physical Activity: Not on file  Stress: Not on file  Social Connections: Not on file   No Known Allergies Family History  Problem Relation Age of Onset  . Heart disease Mother   . Hypertension Mother   . Diabetes Mother   . Stroke Father   . Hypertension Father   . Stroke Brother     Current Outpatient Medications (Endocrine & Metabolic):  .  glipiZIDE (GLUCOTROL XL) 10 MG 24 hr tablet, TAKE 1 TABLET BY MOUTH TWICE A DAY .  insulin NPH Human (NOVOLIN N RELION) 100 UNIT/ML injection, Inject 0.38 mLs (38 Units total) into the skin every morning. And syringes 1/day .  metFORMIN (GLUCOPHAGE) 1000 MG  tablet, TAKE 1 TABLET BY MOUTH TWICE A DAY WITH FOOD  Current Outpatient Medications (Cardiovascular):  .  hydrochlorothiazide (MICROZIDE) 12.5 MG capsule, TAKE 1 CAPSULE BY MOUTH DAILY. ANNUAL APPT DUE IN JUNE MUST SEE PROVIDER FOR FUTURE REFILLS .  lisinopril (ZESTRIL) 20 MG tablet, TAKE 1 TABLET BY MOUTH EVERY DAY .  lovastatin (MEVACOR) 40 MG tablet, TAKE 1 TABLET BY MOUTH AT BEDTIME **ANNUAL APPT DUE IN JUNE,MUST SEE PROVIDER FOR REFILLS*   Current Outpatient Medications (Analgesics):  .  allopurinol (ZYLOPRIM) 100 MG tablet, TAKE 1 TABLET BY MOUTH TWICE A DAY .  indomethacin (INDOCIN) 25 MG capsule, TAKE 1 CAPSULE BY MOUTH TWICE A DAY AS NEEDED .  traMADol (ULTRAM) 50 MG tablet, TAKE 1 TABLET BY MOUTH EVERY 6 HOURS AS NEEDED   Current Outpatient Medications (Other):  .  cyclobenzaprine (FLEXERIL) 5 MG tablet, TAKE 1 TABLET BY MOUTH THREE TIMES A DAY AS NEEDED FOR MUSCLE SPASMS .  diclofenac Sodium (VOLTAREN) 1 % GEL, Apply 2 g topically 4 (four) times daily. Marland Kitchen  gabapentin (NEURONTIN) 100 MG capsule, Take 2 capsules (200 mg total) by mouth at bedtime. .  pantoprazole (PROTONIX) 40 MG tablet, Take 1 tablet (40 mg total) by mouth daily. (Patient taking differently: Take 40 mg by mouth as needed.)   Reviewed prior external information including notes and imaging from  primary care provider As well as notes that were available from care everywhere and other healthcare systems.  Past medical history, social, surgical and family history all reviewed in electronic medical record.  No pertanent information unless stated regarding to the chief complaint.   Review of Systems:  No headache, visual changes, nausea, vomiting, diarrhea, constipation, dizziness, abdominal pain, skin rash, fevers, chills, night sweats, weight loss, swollen lymph nodes, body aches,chest pain, shortness of breath, mood changes. POSITIVE muscle aches, joint swelling  Objective  Blood pressure 128/78, pulse 64,  height 5\' 7"  (1.702 m), weight 232 lb (105.2 kg), SpO2 98 %.   General: No apparent distress alert and oriented x3 mood and affect normal, dressed appropriately.  HEENT: Pupils equal, extraocular movements intact  Respiratory: Patient's speak in full sentences and does not appear short of breath  Cardiovascular: No lower extremity edema, non tender, no erythema  Gait antalgic Patient's right knee does have a trace effusion noted.  Lacking the last 10 degrees of flexion but does have full extension.  Very mild instability noted with valgus and varus force.  After informed  verbal consent and discussing patient's blood sugars and patient needing to monitor more closely, patient was seated on exam table. Right knee was prepped with alcohol swab and utilizing anterolateral approach, patient's right knee space was injected with 4:1  marcaine 0.5%: Kenalog 40mg /dL. Patient tolerated the procedure well without immediate complications.  Discussed with patient if any redness or swelling needs to seek medical attention.  Impression and Recommendations:     The above documentation has been reviewed and is accurate and complete Lyndal Pulley, DO

## 2020-10-27 ENCOUNTER — Ambulatory Visit: Payer: Self-pay | Admitting: Endocrinology

## 2020-11-17 ENCOUNTER — Ambulatory Visit: Payer: Self-pay | Admitting: Family Medicine

## 2020-11-17 NOTE — Progress Notes (Deleted)
[place] Phone: [phone] Subjective:    I'm seeing this patient by the request  of:  [name], MD  CC: Knee pain follow-up  WER:[id number]  10/14/2020 Patient given injection today.  Tolerated the procedure well.  We discussed with patient about the possibility of viscosupplementation consider the patient insurance he is unable to afford it.  Patient is a diabetic and will be watching his blood sugars.  Patient has had difficulty he stated last week where he was having blood sugars in the 300s but now back to the 200s.  Patient does have a follow-up with his endocrinologist in the near future.  Patient was told though if any increase in blood sugars to seek medical attention immediately.  Discussed staying well-hydrated as well.  Athletic trainer was in the room.  Patient was understanding.  We discussed otherwise with the potential for the meniscal tear I do feel that advanced imaging with an MRI would be beneficial.  Would need to consider those secondary to patient also not having insurance coverage and patient would like to avoid surgical intervention.  Patient will be following up with me again in 4 to 6 weeks   Update  Steven [name] is a 60 y.o. male coming in with complaint of R knee pain.  Patient at last exam was given a steroid injection in the knee.  Concern with patient's knee for potential meniscal injury as well.  Due to patient's financial constraints patient declined formal physical therapy, advanced imaging, as well as viscosupplementation.  Patient states   Patient's x-rays from April 2022 were independently visualized by me showing moderate tricompartmental osteoarthritic changes.  This also was confirmed with the ultrasound but did have an acute meniscal tear noted with displacement of the posterior medial aspect.     Past Medical History:  Diagnosis Date   Chicken pox    Diabetes mellitus  without complication ([place])    Essential hypertension    GERD (gastroesophageal reflux disease)    Gouty arthritis    Hypertension    Iron deficiency anemia    Vitamin D deficiency    Past Surgical History:  Procedure Laterality Date   INGUINAL HERNIA REPAIR Right 12/19/2017   Procedure: HERNIA REPAIR INGUINAL RIGHT;  Surgeon: Coralie Keens, MD;  Location: [facility];  Service: General;  Laterality: Right;   INSERTION OF MESH Right 12/19/2017   Procedure: INSERTION OF MESH;  Surgeon: Coralie Keens, MD;  Location: [facility];  Service: General;  Laterality: Right;   MASS EXCISION Right 12/19/2017   Procedure: EXCISION OF RIGHT SCROTAL MASS;  Surgeon: Coralie Keens, MD;  Location: [facility];  Service: General;  Laterality: Right;   TONSILLECTOMY     Social History   Socioeconomic History   Marital status: Single    Spouse name: Not on file   Number of children: 1   Years of education: 12   Highest education level: Not on file  Occupational History   Occupation: Architect  Tobacco Use   Smoking status: Former    Packs/day: 2.00    Years: 25.00    Pack years: 50.00    Types: Cigarettes   Smokeless tobacco: Former    Types: Chew  Substance and Sexual Activity   Alcohol use: Not Currently    Comment: occasionally   Drug use: No   Sexual activity: Yes    Birth control/protection: Condom  Other Topics Concern   Not on file  Social History Narrative   Fun: Just working   Investment banker, operational of Radio broadcast assistant Strain: Not on file  Food Insecurity: Not on file  Transportation Needs: Not on file  Physical Activity: Not on file  Stress: Not on file  Social Connections: Not on file   No Known Allergies Family History  Problem Relation Age of Onset   Heart disease Mother    Hypertension Mother    Diabetes Mother    Stroke Father    Hypertension Father    Stroke Brother     Current Outpatient Medications (Endocrine & Metabolic):    glipiZIDE (GLUCOTROL  XL) 10 MG 24 hr tablet, TAKE 1 TABLET BY MOUTH TWICE A DAY   insulin NPH Human (NOVOLIN N RELION) 100 UNIT/ML injection, Inject 0.38 mLs (38 Units total) into the skin every morning. And syringes 1/day   metFORMIN (GLUCOPHAGE) 1000 MG tablet, TAKE 1 TABLET BY MOUTH TWICE A DAY WITH FOOD  Current Outpatient Medications (Cardiovascular):    hydrochlorothiazide (MICROZIDE) 12.5 MG capsule, TAKE 1 CAPSULE BY MOUTH DAILY. ANNUAL APPT DUE IN JUNE MUST SEE PROVIDER FOR FUTURE REFILLS   lisinopril (ZESTRIL) 20 MG tablet, TAKE 1 TABLET BY MOUTH EVERY DAY   lovastatin (MEVACOR) 40 MG tablet, TAKE 1 TABLET BY MOUTH AT BEDTIME **ANNUAL APPT DUE IN JUNE,MUST SEE PROVIDER FOR REFILLS*   Current Outpatient Medications (Analgesics):    allopurinol (ZYLOPRIM) 100 MG tablet, TAKE 1 TABLET BY MOUTH TWICE A DAY   indomethacin (INDOCIN) 25 MG capsule, TAKE 1 CAPSULE BY MOUTH TWICE A DAY AS NEEDED   traMADol (ULTRAM) 50 MG tablet, TAKE 1 TABLET BY MOUTH EVERY 6 HOURS AS NEEDED   Current Outpatient Medications (Other):    cyclobenzaprine (FLEXERIL) 5 MG tablet, TAKE 1 TABLET BY MOUTH THREE TIMES A DAY AS NEEDED FOR MUSCLE SPASMS   diclofenac Sodium (VOLTAREN) 1 % GEL, Apply 2 g topically 4 (four) times daily.   gabapentin (NEURONTIN) 100 MG capsule, Take 2 capsules (200 mg total) by mouth at bedtime.   pantoprazole (PROTONIX) 40 MG tablet, Take 1 tablet (40 mg total) by mouth daily. (Patient taking differently: Take 40 mg by mouth as needed.)   Reviewed prior external information including notes and imaging from  primary care provider As well as notes that were available from care everywhere and other healthcare systems.  Past medical history, social, surgical and family history all reviewed in electronic medical record.  No pertanent information unless stated regarding to the chief complaint.   Review of Systems:  No headache, visual changes, nausea, vomiting, diarrhea, constipation, dizziness, abdominal  pain, skin rash, fevers, chills, night sweats, weight loss, swollen lymph nodes, body aches, joint swelling, chest pain, shortness of breath, mood changes. POSITIVE muscle aches  Objective  There were no vitals taken for this visit.   General: No apparent distress alert and oriented x3 mood and affect normal, dressed appropriately.  HEENT: Pupils equal, extraocular movements intact  Respiratory: Patient's speak in full sentences and does not appear short of breath  Cardiovascular: No lower extremity edema, non tender, no erythema  Gait    Impression and Recommendations:     The above documentation has been reviewed and is accurate and complete Lyndal Pulley, DO

## 2020-11-18 ENCOUNTER — Ambulatory Visit: Payer: Self-pay | Admitting: Family Medicine

## 2020-12-29 ENCOUNTER — Other Ambulatory Visit: Payer: Self-pay | Admitting: Internal Medicine

## 2020-12-29 NOTE — Telephone Encounter (Signed)
Please refill as per office routine med refill policy (all routine meds refilled for 3 mo or monthly per pt preference up to one year from last visit, then month to month grace period for 3 mo, then further med refills will have to be denied)  

## 2021-01-20 ENCOUNTER — Other Ambulatory Visit: Payer: Self-pay

## 2021-01-20 ENCOUNTER — Ambulatory Visit (INDEPENDENT_AMBULATORY_CARE_PROVIDER_SITE_OTHER): Payer: Self-pay | Admitting: Endocrinology

## 2021-01-20 VITALS — BP 110/50 | HR 60 | Ht 67.0 in | Wt 239.8 lb

## 2021-01-20 DIAGNOSIS — E1142 Type 2 diabetes mellitus with diabetic polyneuropathy: Secondary | ICD-10-CM

## 2021-01-20 LAB — POCT GLYCOSYLATED HEMOGLOBIN (HGB A1C): Hemoglobin A1C: 9.6 % — AB (ref 4.0–5.6)

## 2021-01-20 MED ORDER — INSULIN NPH (HUMAN) (ISOPHANE) 100 UNIT/ML ~~LOC~~ SUSP: 60.0000 [IU] | SUBCUTANEOUS | 11 refills | Status: DC

## 2021-01-20 NOTE — Progress Notes (Signed)
Subjective:    Patient ID: Steven [name], male    DOB: [birth year], 60 y.o.   MRN: [id number]  HPI Pt returns for f/u of diabetes mellitus: DM type: Insulin-requiring type 2 Dx'ed: [id number] Complications: PN Therapy: insulin since 2021, and 2 oral meds.   DKA: never Severe hypoglycemia: never Pancreatitis: never Pancreatic imaging: never SDOH: he gets discount from [facility]; he is intermittently homeless.   Other: he declines multiple daily injections.   Interval history: no cbg record, but states cbg's vary from 260-360, despite increasing to 48 units qam. He never misses the insulin.  pt states he feels well in general. Past Medical History:  Diagnosis Date   Chicken pox    Diabetes mellitus without complication ([place])    Essential hypertension    GERD (gastroesophageal reflux disease)    Gouty arthritis    Hypertension    Iron deficiency anemia    Vitamin D deficiency     Past Surgical History:  Procedure Laterality Date   INGUINAL HERNIA REPAIR Right 12/19/2017   Procedure: HERNIA REPAIR INGUINAL RIGHT;  Surgeon: Coralie Keens, MD;  [facility]: [facility];  Service: General;  Laterality: Right;   INSERTION OF MESH Right 12/19/2017   Procedure: INSERTION OF MESH;  Surgeon: Coralie Keens, MD;  Location: [facility];  Service: General;  Laterality: Right;   MASS EXCISION Right 12/19/2017   Procedure: EXCISION OF RIGHT SCROTAL MASS;  Surgeon: Coralie Keens, MD;  Location: [facility];  Service: General;  Laterality: Right;   TONSILLECTOMY      Social History   Socioeconomic History   Marital status: Single    Spouse name: Not on file   Number of children: 1   Years of education: 12   Highest education level: Not on file  Occupational History   Occupation: Architect  Tobacco Use   Smoking status: Former    Packs/day: 2.00    Years: 25.00    Pack years: 50.00    Types: Cigarettes   Smokeless tobacco: Former    Types: Chew  Substance and Sexual Activity   Alcohol  use: Not Currently    Comment: occasionally   Drug use: No   Sexual activity: Yes    Birth control/protection: Condom  Other Topics Concern   Not on file  Social History Narrative   Fun: Just working   Investment banker, operational of Radio broadcast assistant Strain: Not on file  Food Insecurity: Not on file  Transportation Needs: Not on file  Physical Activity: Not on file  Stress: Not on file  Social Connections: Not on file  Intimate Partner Violence: Not on file    Current Outpatient Medications on File Prior to Visit  Medication Sig Dispense Refill   allopurinol (ZYLOPRIM) 100 MG tablet TAKE 1 TABLET BY MOUTH TWICE A DAY 180 tablet 1   cyclobenzaprine (FLEXERIL) 5 MG tablet TAKE 1 TABLET BY MOUTH THREE TIMES A DAY AS NEEDED FOR MUSCLE SPASMS 30 tablet 5   diclofenac Sodium (VOLTAREN) 1 % GEL Apply 2 g topically 4 (four) times daily. 50 g 0   gabapentin (NEURONTIN) 100 MG capsule Take 2 capsules (200 mg total) by mouth at bedtime. 180 capsule 3   glipiZIDE (GLUCOTROL XL) 10 MG 24 hr tablet TAKE 1 TABLET BY MOUTH TWICE A DAY 180 tablet 1   hydrochlorothiazide (MICROZIDE) 12.5 MG capsule TAKE 1 CAPSULE BY MOUTH DAILY. ANNUAL APPT DUE IN JUNE MUST SEE PROVIDER FOR FUTURE REFILLS 90 capsule 1   indomethacin (  INDOCIN) 25 MG capsule TAKE 1 CAPSULE BY MOUTH TWICE A DAY AS NEEDED 180 capsule 1   lisinopril (ZESTRIL) 20 MG tablet TAKE 1 TABLET BY MOUTH EVERY DAY 90 tablet 1   lovastatin (MEVACOR) 40 MG tablet TAKE 1 TABLET BY MOUTH AT BEDTIME **ANNUAL APPT DUE IN JUNE,MUST SEE PROVIDER FOR REFILLS* 90 tablet 3   metFORMIN (GLUCOPHAGE) 1000 MG tablet TAKE 1 TABLET BY MOUTH TWICE A DAY WITH FOOD 180 tablet 1   pantoprazole (PROTONIX) 40 MG tablet TAKE 1 TABLET BY MOUTH EVERY DAY 90 tablet 3   traMADol (ULTRAM) 50 MG tablet TAKE 1 TABLET BY MOUTH EVERY 6 HOURS AS NEEDED 60 tablet 2   No current facility-administered medications on file prior to visit.    No Known Allergies  Family History   Problem Relation Age of Onset   Heart disease Mother    Hypertension Mother    Diabetes Mother    Stroke Father    Hypertension Father    Stroke Brother     BP (!) 110/50 (BP Location: Right Arm, Patient Position: Sitting, Cuff Size: Large)   Pulse 60   Ht '5\' 7"'$  (1.702 m)   Wt 239 lb 12.8 oz (108.8 kg)   SpO2 97%   BMI 37.56 kg/m    Review of Systems He has gained 10 lbs since last ov.      Objective:   Physical Exam Pulses: dorsalis pedis intact bilat.   MSK: no deformity of the feet CV: trace bilat leg edema Skin:  no ulcer on the feet.  normal color and temp on the feet. Neuro: sensation is intact to touch on the feet.  Ext: there is bilateral onychomycosis of the toenails.   Lab Results  Component Value Date   HGBA1C 9.6 (A) 01/20/2021       Assessment & Plan:  Insulin-requiring type 2 DM: uncontrolled.   Patient Instructions  check your blood sugar once a day.  vary the time of day when you check, between before the 3 meals, and at bedtime.  also check if you have symptoms of your blood sugar being too high or too low.  please keep a record of the readings and bring it to your next appointment here (or you can bring the meter itself).  You can write it on any piece of paper.  please call us sooner if your blood sugar goes below 70, or if you have a lot of readings over 200. Please increase the insulin to 60 units each morning.  Please continue the same other medications.  On this type of insulin schedule, you should eat meals on a regular schedule (especially lunch).  If a meal is missed or significantly delayed, your blood sugar could go low.   Please come back for a follow-up appointment in 2 months.

## 2021-01-20 NOTE — Patient Instructions (Addendum)
check your blood sugar once a day.  vary the time of day when you check, between before the 3 meals, and at bedtime.  also check if you have symptoms of your blood sugar being too high or too low.  please keep a record of the readings and bring it to your next appointment here (or you can bring the meter itself).  You can write it on any piece of paper.  please call us sooner if your blood sugar goes below 70, or if you have a lot of readings over 200. Please increase the insulin to 60 units each morning.  Please continue the same other medications.  On this type of insulin schedule, you should eat meals on a regular schedule (especially lunch).  If a meal is missed or significantly delayed, your blood sugar could go low.   Please come back for a follow-up appointment in 2 months.

## 2021-03-22 ENCOUNTER — Other Ambulatory Visit: Payer: Self-pay

## 2021-03-22 ENCOUNTER — Ambulatory Visit (INDEPENDENT_AMBULATORY_CARE_PROVIDER_SITE_OTHER): Payer: Self-pay | Admitting: Endocrinology

## 2021-03-22 VITALS — BP 94/50 | HR 68 | Ht 67.0 in | Wt 243.0 lb

## 2021-03-22 DIAGNOSIS — E1142 Type 2 diabetes mellitus with diabetic polyneuropathy: Secondary | ICD-10-CM

## 2021-03-22 LAB — POCT GLYCOSYLATED HEMOGLOBIN (HGB A1C): Hemoglobin A1C: 9.8 % — AB (ref 4.0–5.6)

## 2021-03-22 MED ORDER — INSULIN NPH (HUMAN) (ISOPHANE) 100 UNIT/ML ~~LOC~~ SUSP: 85.0000 [IU] | SUBCUTANEOUS | 11 refills | Status: DC

## 2021-03-22 NOTE — Progress Notes (Signed)
Subjective:    Patient ID: Steven [name], male    DOB: 08/17/1960, 60 y.o.   MRN: [id number]  HPI Pt returns for f/u of diabetes mellitus: DM type: Insulin-requiring type 2 Dx'ed: 9417 Complications: PN Therapy: insulin since 2021, and 2 oral meds.   DKA: never Severe hypoglycemia: never Pancreatitis: never Pancreatic imaging: never SDOH: he gets discount from [place]; he is intermittently homeless.   Other: he declines multiple daily injections.   Interval history: no cbg record, but states cbg's vary from 150-400, despite increasing to 72 units qam. He never misses the insulin.  pt states he feels well in general.   Past Medical History:  Diagnosis Date   Chicken pox    Diabetes mellitus without complication ([place])    Essential hypertension    GERD (gastroesophageal reflux disease)    Gouty arthritis    Hypertension    Iron deficiency anemia    Vitamin D deficiency     Past Surgical History:  Procedure Laterality Date   INGUINAL HERNIA REPAIR Right 12/19/2017   Procedure: HERNIA REPAIR INGUINAL RIGHT;  Surgeon: Coralie Keens, MD;  [facility];  Service: General;  Laterality: Right;   INSERTION OF MESH Right 12/19/2017   Procedure: INSERTION OF MESH;  Surgeon: Coralie Keens, MD;  [facility];  Service: General;  Laterality: Right;   MASS EXCISION Right 12/19/2017   Procedure: EXCISION OF RIGHT SCROTAL MASS;  Surgeon: Coralie Keens, MD;  Location: [facility];  Service: General;  Laterality: Right;   TONSILLECTOMY      Social History   Socioeconomic History   Marital status: Single    Spouse name: Not on file   Number of children: 1   Years of education: 12   Highest education level: Not on file  Occupational History   Occupation: Architect  Tobacco Use   Smoking status: Former    Packs/day: 2.00    Years: 25.00    Pack years: 50.00    Types: Cigarettes   Smokeless tobacco: Former    Types: Chew  Substance and Sexual Activity   Alcohol  use: Not Currently    Comment: occasionally   Drug use: No   Sexual activity: Yes    Birth control/protection: Condom  Other Topics Concern   Not on file  Social History Narrative   Fun: Just working   Investment banker, operational of Radio broadcast assistant Strain: Not on file  Food Insecurity: Not on file  Transportation Needs: Not on file  Physical Activity: Not on file  Stress: Not on file  Social Connections: Not on file  Intimate Partner Violence: Not on file    Current Outpatient Medications on File Prior to Visit  Medication Sig Dispense Refill   allopurinol (ZYLOPRIM) 100 MG tablet TAKE 1 TABLET BY MOUTH TWICE A DAY 180 tablet 1   cyclobenzaprine (FLEXERIL) 5 MG tablet TAKE 1 TABLET BY MOUTH THREE TIMES A DAY AS NEEDED FOR MUSCLE SPASMS 30 tablet 5   diclofenac Sodium (VOLTAREN) 1 % GEL Apply 2 g topically 4 (four) times daily. 50 g 0   gabapentin (NEURONTIN) 100 MG capsule Take 2 capsules (200 mg total) by mouth at bedtime. 180 capsule 3   glipiZIDE (GLUCOTROL XL) 10 MG 24 hr tablet TAKE 1 TABLET BY MOUTH TWICE A DAY 180 tablet 1   indomethacin (INDOCIN) 25 MG capsule TAKE 1 CAPSULE BY MOUTH TWICE A DAY AS NEEDED 180 capsule 1   lisinopril (ZESTRIL) 20 MG tablet TAKE  1 TABLET BY MOUTH EVERY DAY 90 tablet 1   lovastatin (MEVACOR) 40 MG tablet TAKE 1 TABLET BY MOUTH AT BEDTIME **ANNUAL APPT DUE IN JUNE,MUST SEE PROVIDER FOR REFILLS* 90 tablet 3   metFORMIN (GLUCOPHAGE) 1000 MG tablet TAKE 1 TABLET BY MOUTH TWICE A DAY WITH FOOD 180 tablet 1   pantoprazole (PROTONIX) 40 MG tablet TAKE 1 TABLET BY MOUTH EVERY DAY 90 tablet 3   traMADol (ULTRAM) 50 MG tablet TAKE 1 TABLET BY MOUTH EVERY 6 HOURS AS NEEDED 60 tablet 2   No current facility-administered medications on file prior to visit.    No Known Allergies  Family History  Problem Relation Age of Onset   Heart disease Mother    Hypertension Mother    Diabetes Mother    Stroke Father    Hypertension Father    Stroke  Brother     BP (!) 94/50 (BP Location: Right Arm, Patient Position: Sitting, Cuff Size: Large)   Pulse 68   Ht 5\' 7"  (1.702 m)   Wt 243 lb (110.2 kg)   SpO2 98%   BMI 38.06 kg/m    Review of Systems     Objective:   Physical Exam Pulses: dorsalis pedis intact bilat.   MSK: no deformity of the feet CV: 2+ bilat leg edema Skin:  no ulcer on the feet.  normal color and temp on the feet. Neuro: sensation is intact to touch on the feet.  Ext: there is bilateral onychomycosis of the toenails.    Lab Results  Component Value Date   HGBA1C 9.8 (A) 03/22/2021      Assessment & Plan:  Insulin-requiring type 2 DM: uncontrolled.    Patient Instructions  check your blood sugar once a day.  vary the time of day when you check, between before the 3 meals, and at bedtime.  also check if you have symptoms of your blood sugar being too high or too low.  please keep a record of the readings and bring it to your next appointment here (or you can bring the meter itself).  You can write it on any piece of paper.  please call us sooner if your blood sugar goes below 70, or if you have a lot of readings over 200. Please increase the insulin to 85 units each morning.  Please continue the same other medications.  On this type of insulin schedule, you should eat meals on a regular schedule (especially lunch).  If a meal is missed or significantly delayed, your blood sugar could go low.   Please come back for a follow-up appointment in 3 months.

## 2021-03-22 NOTE — Patient Instructions (Addendum)
check your blood sugar once a day.  vary the time of day when you check, between before the 3 meals, and at bedtime.  also check if you have symptoms of your blood sugar being too high or too low.  please keep a record of the readings and bring it to your next appointment here (or you can bring the meter itself).  You can write it on any piece of paper.  please call us sooner if your blood sugar goes below 70, or if you have a lot of readings over 200. Please increase the insulin to 85 units each morning.  Please continue the same other medications.  On this type of insulin schedule, you should eat meals on a regular schedule (especially lunch).  If a meal is missed or significantly delayed, your blood sugar could go low.   Please come back for a follow-up appointment in 3 months.

## 2021-03-23 ENCOUNTER — Encounter: Payer: Self-pay | Admitting: Internal Medicine

## 2021-03-23 ENCOUNTER — Ambulatory Visit (INDEPENDENT_AMBULATORY_CARE_PROVIDER_SITE_OTHER): Payer: Self-pay | Admitting: Internal Medicine

## 2021-03-23 ENCOUNTER — Ambulatory Visit (INDEPENDENT_AMBULATORY_CARE_PROVIDER_SITE_OTHER): Payer: Self-pay | Admitting: Sports Medicine

## 2021-03-23 ENCOUNTER — Ambulatory Visit: Payer: Self-pay

## 2021-03-23 VITALS — BP 120/70 | HR 66 | Ht 67.0 in | Wt 243.0 lb

## 2021-03-23 VITALS — BP 120/70 | HR 68 | Ht 67.0 in | Wt 243.0 lb

## 2021-03-23 DIAGNOSIS — I872 Venous insufficiency (chronic) (peripheral): Secondary | ICD-10-CM

## 2021-03-23 DIAGNOSIS — E538 Deficiency of other specified B group vitamins: Secondary | ICD-10-CM

## 2021-03-23 DIAGNOSIS — E7800 Pure hypercholesterolemia, unspecified: Secondary | ICD-10-CM

## 2021-03-23 DIAGNOSIS — E559 Vitamin D deficiency, unspecified: Secondary | ICD-10-CM

## 2021-03-23 DIAGNOSIS — G8929 Other chronic pain: Secondary | ICD-10-CM

## 2021-03-23 DIAGNOSIS — M25561 Pain in right knee: Secondary | ICD-10-CM

## 2021-03-23 DIAGNOSIS — M79671 Pain in right foot: Secondary | ICD-10-CM

## 2021-03-23 DIAGNOSIS — N320 Bladder-neck obstruction: Secondary | ICD-10-CM

## 2021-03-23 DIAGNOSIS — R600 Localized edema: Secondary | ICD-10-CM | POA: Insufficient documentation

## 2021-03-23 DIAGNOSIS — I10 Essential (primary) hypertension: Secondary | ICD-10-CM

## 2021-03-23 DIAGNOSIS — R0602 Shortness of breath: Secondary | ICD-10-CM

## 2021-03-23 DIAGNOSIS — Z23 Encounter for immunization: Secondary | ICD-10-CM

## 2021-03-23 DIAGNOSIS — E1142 Type 2 diabetes mellitus with diabetic polyneuropathy: Secondary | ICD-10-CM

## 2021-03-23 DIAGNOSIS — R609 Edema, unspecified: Secondary | ICD-10-CM

## 2021-03-23 LAB — PSA: PSA: 0.78 ng/mL (ref 0.10–4.00)

## 2021-03-23 LAB — URINALYSIS, ROUTINE W REFLEX MICROSCOPIC
Bilirubin Urine: NEGATIVE
Hgb urine dipstick: NEGATIVE
Ketones, ur: NEGATIVE
Leukocytes,Ua: NEGATIVE
Nitrite: NEGATIVE
RBC / HPF: NONE SEEN (ref 0–?)
Specific Gravity, Urine: <=1.005 — AB (ref 1.000–1.030)
Total Protein, Urine: NEGATIVE
Urine Glucose: NEGATIVE
Urobilinogen, UA: 0.2 (ref 0.0–1.0)
WBC, UA: NONE SEEN (ref 0–?)
pH: 5.5 (ref 5.0–8.0)

## 2021-03-23 LAB — CBC WITH DIFFERENTIAL/PLATELET
Basophils Absolute: 0 10*3/uL (ref 0.0–0.1)
Basophils Relative: 0.3 % (ref 0.0–3.0)
Eosinophils Absolute: 0.1 10*3/uL (ref 0.0–0.7)
Eosinophils Relative: 2.1 % (ref 0.0–5.0)
HCT: 36.6 % — ABNORMAL LOW (ref 39.0–52.0)
Hemoglobin: 11.4 g/dL — ABNORMAL LOW (ref 13.0–17.0)
Lymphocytes Relative: 40.9 % (ref 12.0–46.0)
Lymphs Abs: 2.7 10*3/uL (ref 0.7–4.0)
MCHC: 31.1 g/dL (ref 30.0–36.0)
MCV: 78.1 fl (ref 78.0–100.0)
Monocytes Absolute: 0.6 10*3/uL (ref 0.1–1.0)
Monocytes Relative: 8.8 % (ref 3.0–12.0)
Neutro Abs: 3.2 10*3/uL (ref 1.4–7.7)
Neutrophils Relative %: 47.9 % (ref 43.0–77.0)
Platelets: 308 10*3/uL (ref 150.0–400.0)
RBC: 4.68 Mil/uL (ref 4.22–5.81)
RDW: 18 % — ABNORMAL HIGH (ref 11.5–15.5)
WBC: 6.6 10*3/uL (ref 4.0–10.5)

## 2021-03-23 LAB — LIPID PANEL
Cholesterol: 140 mg/dL (ref 0–200)
HDL: 35.8 mg/dL — ABNORMAL LOW (ref >39.00)
LDL Cholesterol: 73 mg/dL (ref 0–99)
NonHDL: 103.95
Total CHOL/HDL Ratio: 4
Triglycerides: 155 mg/dL — ABNORMAL HIGH (ref 0.0–149.0)
VLDL: 31 mg/dL (ref 0.0–40.0)

## 2021-03-23 LAB — HEPATIC FUNCTION PANEL
ALT: 32 U/L (ref 0–53)
AST: 21 U/L (ref 0–37)
Albumin: 4.3 g/dL (ref 3.5–5.2)
Alkaline Phosphatase: 59 U/L (ref 39–117)
Bilirubin, Direct: 0.1 mg/dL (ref 0.0–0.3)
Total Bilirubin: 0.4 mg/dL (ref 0.2–1.2)
Total Protein: 6.8 g/dL (ref 6.0–8.3)

## 2021-03-23 LAB — TSH: TSH: 2.21 u[IU]/mL (ref 0.35–5.50)

## 2021-03-23 LAB — VITAMIN D 25 HYDROXY (VIT D DEFICIENCY, FRACTURES): VITD: 27.9 ng/mL — ABNORMAL LOW (ref 30.00–100.00)

## 2021-03-23 LAB — BASIC METABOLIC PANEL
BUN: 19 mg/dL (ref 6–23)
CO2: 26 mEq/L (ref 19–32)
Calcium: 9.4 mg/dL (ref 8.4–10.5)
Chloride: 99 mEq/L (ref 96–112)
Creatinine, Ser: 1.18 mg/dL (ref 0.40–1.50)
GFR: 66.04 mL/min (ref >60.00)
Glucose, Bld: 206 mg/dL — ABNORMAL HIGH (ref 70–99)
Potassium: 4.6 mEq/L (ref 3.5–5.1)
Sodium: 134 mEq/L — ABNORMAL LOW (ref 135–145)

## 2021-03-23 LAB — VITAMIN B12: Vitamin B-12: 549 pg/mL (ref 211–911)

## 2021-03-23 LAB — BRAIN NATRIURETIC PEPTIDE: Pro B Natriuretic peptide (BNP): 4 pg/mL (ref 0.0–100.0)

## 2021-03-23 MED ORDER — MELOXICAM 15 MG PO TABS: 15.0000 mg | ORAL_TABLET | Freq: Every day | ORAL | 11 refills | Status: DC | PRN

## 2021-03-23 MED ORDER — HYDROCHLOROTHIAZIDE 25 MG PO TABS: 25.0000 mg | ORAL_TABLET | Freq: Every day | ORAL | 3 refills | Status: DC

## 2021-03-23 NOTE — Patient Instructions (Addendum)
You had the pneumovax (pneumonia) shot and the flu shot today  Please take all new medication as prescribed - the generic for meloxicam (anti-inflammatory) for knee and ankle pain  Ok to increase the HCT (fluid pill) to 25 mg per day for the swelling  Please continue all other medications as before, and refills have been done if requested.  Please have the pharmacy call with any other refills you may need.  Please continue your efforts at being more active, low cholesterol diet, and weight control  Please keep your appointments with your specialists as you may have planned  You will be contacted regarding the referral for: Sports Medicine  Please go to the LAB at the blood drawing area for the tests to be done  You will be contacted by phone if any changes need to be made immediately.  Otherwise, you will receive a letter about your results with an explanation, but please check with MyChart first.  Please remember to sign up for MyChart if you have not done so, as this will be important to you in the future with finding out test results, communicating by private email, and scheduling acute appointments online when needed.  Please make an Appointment to return in 6 months, or sooner if needed

## 2021-03-23 NOTE — Progress Notes (Signed)
Patient ID: Steven [name], male   DOB: 09-26-1960, 60 y.o.   MRN: [id number]        Chief Complaint: follow up HTN, HLD and hyperglycemia  right foot pain and bilateral leg swelling       HPI:  Steven [name] is a 60 y.o. male here with c/o  2 wks worsening right dorsal mid foot pain and lateral ankle pain without swelling or instability or falls, but also has bilateral worsening distal leg swelling better in the AM, worse in the pm.  Pt denies chest pain, increased sob or doe, wheezing, orthopnea, PND,  palpitations, dizziness or syncope, but has mild worsening bilateral leg swelling.   Pt denies polydipsia, polyuria, or new focal neuro s/s.  Trying to get the colonoscopy maybe next year.  Pt denies fever, wt loss, night sweats, loss of appetite, or other constitutional symptoms    Wt Readings from Last 3 Encounters:  03/23/21 243 lb (110.2 kg)  03/23/21 243 lb (110.2 kg)  03/22/21 243 lb (110.2 kg)   BP Readings from Last 3 Encounters:  03/23/21 120/70  03/23/21 120/70  03/22/21 (!) 94/50         Past Medical History:  Diagnosis Date   Chicken pox    Diabetes mellitus without complication (HCC)    Essential hypertension    GERD (gastroesophageal reflux disease)    Gouty arthritis    Hypertension    Iron deficiency anemia    Vitamin D deficiency    Past Surgical History:  Procedure Laterality Date   INGUINAL HERNIA REPAIR Right 12/19/2017   Procedure: HERNIA REPAIR INGUINAL RIGHT;  Surgeon: Coralie Keens, MD;  Location: [facility];  Service: General;  Laterality: Right;   INSERTION OF MESH Right 12/19/2017   Procedure: INSERTION OF MESH;  Surgeon: Coralie Keens, MD;  Location: [facility];  Service: General;  Laterality: Right;   MASS EXCISION Right 12/19/2017   Procedure: EXCISION OF RIGHT SCROTAL MASS;  Surgeon: Coralie Keens, MD;  Location: [facility];  Service: General;  Laterality: Right;   TONSILLECTOMY      reports that he has quit smoking. His smoking use included  cigarettes. He has a 50.00 pack-year smoking history. He has quit using smokeless tobacco.  His smokeless tobacco use included chew. He reports that he does not currently use alcohol. He reports that he does not use drugs. family history includes Diabetes in his mother; Heart disease in his mother; Hypertension in his father and mother; Stroke in his brother and father. No Known Allergies Current Outpatient Medications on File Prior to Visit  Medication Sig Dispense Refill   allopurinol (ZYLOPRIM) 100 MG tablet TAKE 1 TABLET BY MOUTH TWICE A DAY 180 tablet 1   cyclobenzaprine (FLEXERIL) 5 MG tablet TAKE 1 TABLET BY MOUTH THREE TIMES A DAY AS NEEDED FOR MUSCLE SPASMS 30 tablet 5   diclofenac Sodium (VOLTAREN) 1 % GEL Apply 2 g topically 4 (four) times daily. 50 g 0   gabapentin (NEURONTIN) 100 MG capsule Take 2 capsules (200 mg total) by mouth at bedtime. 180 capsule 3   glipiZIDE (GLUCOTROL XL) 10 MG 24 hr tablet TAKE 1 TABLET BY MOUTH TWICE A DAY 180 tablet 1   indomethacin (INDOCIN) 25 MG capsule TAKE 1 CAPSULE BY MOUTH TWICE A DAY AS NEEDED 180 capsule 1   insulin NPH Human (NOVOLIN N RELION) 100 UNIT/ML injection Inject 0.85 mLs (85 Units total) into the skin every morning. And syringes 1/day 30 mL 11   lisinopril (  ZESTRIL) 20 MG tablet TAKE 1 TABLET BY MOUTH EVERY DAY 90 tablet 1   lovastatin (MEVACOR) 40 MG tablet TAKE 1 TABLET BY MOUTH AT BEDTIME **ANNUAL APPT DUE IN JUNE,MUST SEE PROVIDER FOR REFILLS* 90 tablet 3   metFORMIN (GLUCOPHAGE) 1000 MG tablet TAKE 1 TABLET BY MOUTH TWICE A DAY WITH FOOD 180 tablet 1   pantoprazole (PROTONIX) 40 MG tablet TAKE 1 TABLET BY MOUTH EVERY DAY 90 tablet 3   traMADol (ULTRAM) 50 MG tablet TAKE 1 TABLET BY MOUTH EVERY 6 HOURS AS NEEDED 60 tablet 2   No current facility-administered medications on file prior to visit.        ROS:  All others reviewed and negative.  Objective        PE:  BP 120/70 (BP Location: Right Arm, Patient Position: Sitting,  Cuff Size: Large)   Pulse 66   Ht 5\' 7"  (1.702 m)   Wt 243 lb (110.2 kg)   SpO2 97%   BMI 38.06 kg/m                 Constitutional: Pt appears in NAD               HENT: Head: NCAT.                Right Ear: External ear normal.                 Left Ear: External ear normal.                Eyes: . Pupils are equal, round, and reactive to light. Conjunctivae and EOM are normal               Nose: without d/c or deformity               Neck: Neck supple. Gross normal ROM               Cardiovascular: Normal rate and regular rhythm.                 Pulmonary/Chest: Effort normal and breath sounds without rales or wheezing.                Abd:  Soft, NT, ND, + BS, no organomegaly               Neurological: Pt is alert. At baseline orientation, motor grossly intact               Skin: Skin is warm. No rashes, no other new lesions, LE edema - none               Right dorsal foot with mild mid dorsal tenderness without redness, swelling, ulcer, red streaks or trauma               Psychiatric: Pt behavior is normal without agitation   Micro: none  Cardiac tracings I have personally interpreted today:  none  Pertinent Radiological findings (summarize): none   Lab Results  Component Value Date   WBC 6.6 03/23/2021   HGB 11.4 (L) 03/23/2021   HCT 36.6 (L) 03/23/2021   PLT 308.0 03/23/2021   GLUCOSE 206 (H) 03/23/2021   CHOL 140 03/23/2021   TRIG 155.0 (H) 03/23/2021   HDL 35.80 (L) 03/23/2021   LDLCALC 73 03/23/2021   ALT 32 03/23/2021   AST 21 03/23/2021   NA 134 (L) 03/23/2021   K 4.6 03/23/2021   CL 99 03/23/2021  CREATININE 1.18 03/23/2021   BUN 19 03/23/2021   CO2 26 03/23/2021   TSH 2.21 03/23/2021   PSA 0.78 03/23/2021   HGBA1C 9.8 (A) 03/22/2021   MICROALBUR <0.7 11/06/2019   Assessment/Plan:  Steven [name] is a 60 y.o. Black or African American [2] male with  has a past medical history of Chicken pox, Diabetes mellitus without complication ([place]), Essential  hypertension, GERD (gastroesophageal reflux disease), Gouty arthritis, Hypertension, Iron deficiency anemia, and Vitamin D deficiency.  Vitamin D deficiency Last vitamin D Lab Results  Component Value Date   VD25OH 58.56 05/18/2020   Stable, cont oral replacement   Essential hypertension BP Readings from Last 3 Encounters:  03/23/21 120/70  03/23/21 120/70  03/22/21 (!) 94/50   Stable, pt to continue medical treatment lisinopril, hct   Venous insufficiency Mild worsening, for increased hct 25 qd, check bnp,  to f/u any worsening symptoms or concerns  Right foot pain Etiology unclear, right mid foot pain - ? Tendonitis vs djd vs other - for mobic prn, and refer sports medicine this buildling,  to f/u any worsening symptoms or concerns  HLD (hyperlipidemia) Lab Results  Component Value Date   LDLCALC 73 03/23/2021   Mild uncontrolled, goal ldl < 70,, pt to continue current statin lovastatin 40 as declines change  Followup: Return if symptoms worsen or fail to improve.  Cathlean Cower, MD 03/29/2021 10:57 PM [facility]

## 2021-03-23 NOTE — Patient Instructions (Signed)
Good to see you Injection in knee given today  Go back upstairs to see if you can get another appointment with Dr Jenny Reichmann to discuss SOB, bilateral extremity edema, no red flag on exam, but should be evaluated for heart failure and needing an Echo, but I will allow Dr. Jenny Reichmann to do that work up.   Follow up with [place] as needed

## 2021-03-23 NOTE — Assessment & Plan Note (Signed)
Last vitamin D Lab Results  Component Value Date   VD25OH 58.56 05/18/2020   Stable, cont oral replacement

## 2021-03-23 NOTE — Progress Notes (Signed)
Steven [name] D.[place] Phone: [phone]   Assessment and Plan:     1. Chronic pain of right knee -Chronic with exacerbation, subsequent visit - Patient received several months of benefit from previous CSI to right knee.  Elects for repeat CSI today.  Tolerated well per note below  3. Bilateral lower extremity edema 4. Shortness of breath 2. Right foot pain -Acute, uncertain prognosis, initial visit - Based on patient HPI, physical exam, comorbidities, bilateral 2+ pitting edema, complaint of mildly increasing shortness of breath on exertion, I feel that patient would benefit from CHF work-up.  No red flag symptoms on exam today and vital signs are stable, so work-up is not emergent.  Patient to follow-up with PCP potentially for echo versus EKG versus treatment plan to be dictated by PCP. -I believe that right foot pain is due to lower extremity edema causing increased soft tissue pressure.  Pain should improve as swelling improves   Procedure: Knee Joint Injection Side: Right Indication: Chronic knee pain with exacerbation  Risks explained and consent was given verbally. The site was cleaned with alcohol prep. A needle was introduced with an anterio-lateral approach. Injection given using 62mL of 1% lidocaine without epinephrine and 78mL of kenalog 40mg /ml. This was well tolerated and resulted in symptomatic relief.  Needle was removed, hemostasis achieved, and post injection instructions were explained.   Pt was advised to call or return to clinic if these symptoms worsen or fail to improve as anticipated.  Pertinent previous records reviewed include endocrine note from 03/22/2021, PCP note from 03/23/2021, previous knee x-ray   Follow Up: As needed if no improvement or worsening of symptoms   Subjective:   I, Judy Pimple, am serving as a scribe for Dr. Glennon Mac  Chief Complaint: right knee  and Right foot pain   HPI:   03/23/21 Patient is a 60 year old male presenting with right knee and right foot pain. Patient was seen by his PCP this morning and was referred to sports medicine for evaluation and treatment. Patient has had a flare for the last couple of months. Patient states that he has had issues with this in the past. Patient locates foot pain to the top of foot and around the ankle. Patient states right knee has not started to be painful yet but is swollen and has used topical ointment and a compression sleeve. Patient describes pain as pulling, stretching, aching, and hot.     Relevant Historical Information: DM type II, hypertension  Additional pertinent review of systems negative.   Current Outpatient Medications:    allopurinol (ZYLOPRIM) 100 MG tablet, TAKE 1 TABLET BY MOUTH TWICE A DAY, Disp: 180 tablet, Rfl: 1   cyclobenzaprine (FLEXERIL) 5 MG tablet, TAKE 1 TABLET BY MOUTH THREE TIMES A DAY AS NEEDED FOR MUSCLE SPASMS, Disp: 30 tablet, Rfl: 5   diclofenac Sodium (VOLTAREN) 1 % GEL, Apply 2 g topically 4 (four) times daily., Disp: 50 g, Rfl: 0   gabapentin (NEURONTIN) 100 MG capsule, Take 2 capsules (200 mg total) by mouth at bedtime., Disp: 180 capsule, Rfl: 3   glipiZIDE (GLUCOTROL XL) 10 MG 24 hr tablet, TAKE 1 TABLET BY MOUTH TWICE A DAY, Disp: 180 tablet, Rfl: 1   hydrochlorothiazide (HYDRODIURIL) 25 MG tablet, Take 1 tablet (25 mg total) by mouth daily., Disp: 90 tablet, Rfl: 3   indomethacin (INDOCIN) 25 MG capsule, TAKE 1 CAPSULE BY MOUTH TWICE  A DAY AS NEEDED, Disp: 180 capsule, Rfl: 1   insulin NPH Human (NOVOLIN N RELION) 100 UNIT/ML injection, Inject 0.85 mLs (85 Units total) into the skin every morning. And syringes 1/day, Disp: 30 mL, Rfl: 11   lisinopril (ZESTRIL) 20 MG tablet, TAKE 1 TABLET BY MOUTH EVERY DAY, Disp: 90 tablet, Rfl: 1   lovastatin (MEVACOR) 40 MG tablet, TAKE 1 TABLET BY MOUTH AT BEDTIME **ANNUAL APPT DUE IN JUNE,MUST SEE PROVIDER FOR  REFILLS*, Disp: 90 tablet, Rfl: 3   meloxicam (MOBIC) 15 MG tablet, Take 1 tablet (15 mg total) by mouth daily as needed for pain., Disp: 30 tablet, Rfl: 11   metFORMIN (GLUCOPHAGE) 1000 MG tablet, TAKE 1 TABLET BY MOUTH TWICE A DAY WITH FOOD, Disp: 180 tablet, Rfl: 1   pantoprazole (PROTONIX) 40 MG tablet, TAKE 1 TABLET BY MOUTH EVERY DAY, Disp: 90 tablet, Rfl: 3   traMADol (ULTRAM) 50 MG tablet, TAKE 1 TABLET BY MOUTH EVERY 6 HOURS AS NEEDED, Disp: 60 tablet, Rfl: 2   Objective:     Vitals:   03/23/21 0914  BP: 120/70  Pulse: 68  SpO2: 98%  Weight: 243 lb (110.2 kg)  Height: 5\' 7"  (1.702 m)      Body mass index is 38.06 kg/m.    Physical Exam:    General:  awake, alert oriented, no acute distress nontoxic Skin: no suspicious lesions or rashes Neuro:sensation intact, no deficits, strength 5/5 with no deficits, no atrophy, normal muscle tone Psych: No signs of anxiety, depression or other mood disorder Extremities: Bilateral 2+ pitting edema.  No erythema.  Right knee: Crepitus with movement No swelling No deformity Neg fluid wave, joint milking ROM Flex 100, Ext 5 NTTP over the quad tendon, medial fem condyle, lat fem condyle, patella, plica, patella tendon, tibial tuberostiy, fibular head, posterior fossa, pes anserine bursa, gerdy's tubercle, medial jt line, lateral jt line Neg lachman Neg sag sign Negative varus stress Negative valgus stress Negative Mc[place]ray   Gait normal    Electronically signed by:  [name] 9:33 AM 03/23/21

## 2021-03-29 ENCOUNTER — Encounter: Payer: Self-pay | Admitting: Internal Medicine

## 2021-03-29 DIAGNOSIS — M79671 Pain in right foot: Secondary | ICD-10-CM | POA: Insufficient documentation

## 2021-03-29 NOTE — Assessment & Plan Note (Signed)
BP Readings from Last 3 Encounters:  03/23/21 120/70  03/23/21 120/70  03/22/21 (!) 94/50   Stable, pt to continue medical treatment lisinopril, hct

## 2021-03-29 NOTE — Assessment & Plan Note (Signed)
Lab Results  Component Value Date   LDLCALC 73 03/23/2021   Mild uncontrolled, goal ldl < 70,, pt to continue current statin lovastatin 40 as declines change

## 2021-03-29 NOTE — Assessment & Plan Note (Signed)
Etiology unclear, right mid foot pain - ? Tendonitis vs djd vs other - for mobic prn, and refer sports medicine this buildling,  to f/u any worsening symptoms or concerns

## 2021-03-29 NOTE — Assessment & Plan Note (Signed)
Mild worsening, for increased hct 25 qd, check bnp,  to f/u any worsening symptoms or concerns

## 2021-04-06 ENCOUNTER — Encounter: Payer: Self-pay | Admitting: Internal Medicine

## 2021-04-06 ENCOUNTER — Ambulatory Visit (INDEPENDENT_AMBULATORY_CARE_PROVIDER_SITE_OTHER): Payer: Self-pay | Admitting: Internal Medicine

## 2021-04-06 ENCOUNTER — Other Ambulatory Visit: Payer: Self-pay

## 2021-04-06 VITALS — BP 136/70 | HR 75 | Temp 98.5°F | Ht 67.0 in | Wt 244.0 lb

## 2021-04-06 DIAGNOSIS — D509 Iron deficiency anemia, unspecified: Secondary | ICD-10-CM

## 2021-04-06 DIAGNOSIS — R600 Localized edema: Secondary | ICD-10-CM | POA: Insufficient documentation

## 2021-04-06 DIAGNOSIS — I10 Essential (primary) hypertension: Secondary | ICD-10-CM

## 2021-04-06 DIAGNOSIS — R609 Edema, unspecified: Secondary | ICD-10-CM

## 2021-04-06 DIAGNOSIS — E559 Vitamin D deficiency, unspecified: Secondary | ICD-10-CM

## 2021-04-06 LAB — IBC PANEL
Iron: 46 ug/dL (ref 42–165)
Saturation Ratios: 11.4 % — ABNORMAL LOW (ref 20.0–50.0)
TIBC: 404.6 ug/dL (ref 250.0–450.0)
Transferrin: 289 mg/dL (ref 212.0–360.0)

## 2021-04-06 LAB — CBC WITH DIFFERENTIAL/PLATELET
Basophils Absolute: 0.1 10*3/uL (ref 0.0–0.1)
Basophils Relative: 0.7 % (ref 0.0–3.0)
Eosinophils Absolute: 0.2 10*3/uL (ref 0.0–0.7)
Eosinophils Relative: 2.5 % (ref 0.0–5.0)
HCT: 36.7 % — ABNORMAL LOW (ref 39.0–52.0)
Hemoglobin: 11.4 g/dL — ABNORMAL LOW (ref 13.0–17.0)
Lymphocytes Relative: 36 % (ref 12.0–46.0)
Lymphs Abs: 2.5 10*3/uL (ref 0.7–4.0)
MCHC: 31 g/dL (ref 30.0–36.0)
MCV: 78.5 fl (ref 78.0–100.0)
Monocytes Absolute: 0.5 10*3/uL (ref 0.1–1.0)
Monocytes Relative: 7 % (ref 3.0–12.0)
Neutro Abs: 3.8 10*3/uL (ref 1.4–7.7)
Neutrophils Relative %: 53.8 % (ref 43.0–77.0)
Platelets: 331 10*3/uL (ref 150.0–400.0)
RBC: 4.67 Mil/uL (ref 4.22–5.81)
RDW: 17.7 % — ABNORMAL HIGH (ref 11.5–15.5)
WBC: 7.1 10*3/uL (ref 4.0–10.5)

## 2021-04-06 LAB — BASIC METABOLIC PANEL
BUN: 21 mg/dL (ref 6–23)
CO2: 27 mEq/L (ref 19–32)
Calcium: 9.1 mg/dL (ref 8.4–10.5)
Chloride: 100 mEq/L (ref 96–112)
Creatinine, Ser: 1.19 mg/dL (ref 0.40–1.50)
GFR: 65.35 mL/min (ref >60.00)
Glucose, Bld: 251 mg/dL — ABNORMAL HIGH (ref 70–99)
Potassium: 4.2 mEq/L (ref 3.5–5.1)
Sodium: 134 mEq/L — ABNORMAL LOW (ref 135–145)

## 2021-04-06 LAB — FERRITIN: Ferritin: 79.8 ng/mL (ref 22.0–322.0)

## 2021-04-06 NOTE — Addendum Note (Signed)
Addended by: Boris Lown B on: 04/06/2021 10:31 AM   Modules accepted: Orders

## 2021-04-06 NOTE — Assessment & Plan Note (Signed)
BP Readings from Last 3 Encounters:  04/06/21 136/70  03/23/21 120/70  03/23/21 120/70   Stable, pt to continue medical treatment hct, lisinopril

## 2021-04-06 NOTE — Progress Notes (Signed)
Patient ID: Steven [name], male   DOB: [birth year], 60 y.o.   MRN: [id number]        Chief Complaint: follow up HTN, iron def anemia, peripheral edema, low vit d       HPI:  Steven [name] is a 60 y.o. male here overall doing ok, Pt denies chest pain, increased sob or doe, wheezing, orthopnea, PND, palpitations, dizziness or syncope and in fact has decreased leg swelling now with trace right distal leg and bilateral pedal edema only after starting the hct x 2 wks ago.  Recent BNP at 4 (very low).  Sport med with some concern about CHF and pt returned here.  Not taking Vit D.   Pt denies polydipsia, polyuria, or new focal neuro s/s.   Pt denies fever, wt loss, night sweats, loss of appetite, or other constitutional symptoms     no overt bleeding       Wt Readings from Last 3 Encounters:  04/06/21 244 lb (110.7 kg)  03/23/21 243 lb (110.2 kg)  03/23/21 243 lb (110.2 kg)   BP Readings from Last 3 Encounters:  04/06/21 136/70  03/23/21 120/70  03/23/21 120/70         Past Medical History:  Diagnosis Date   Chicken pox    Diabetes mellitus without complication (HCC)    Essential hypertension    GERD (gastroesophageal reflux disease)    Gouty arthritis    Hypertension    Iron deficiency anemia    Vitamin D deficiency    Past Surgical History:  Procedure Laterality Date   INGUINAL HERNIA REPAIR Right 12/19/2017   Procedure: HERNIA REPAIR INGUINAL RIGHT;  Surgeon: Coralie Keens, MD;  Location: [facility];  Service: General;  Laterality: Right;   INSERTION OF MESH Right 12/19/2017   Procedure: INSERTION OF MESH;  Surgeon: Coralie Keens, MD;  Location: [facility];  Service: General;  Laterality: Right;   MASS EXCISION Right 12/19/2017   Procedure: EXCISION OF RIGHT SCROTAL MASS;  Surgeon: Coralie Keens, MD;  Location: [facility];  Service: General;  Laterality: Right;   TONSILLECTOMY      reports that he has quit smoking. His smoking use included cigarettes. He has a 50.00 pack-year smoking  history. He has quit using smokeless tobacco.  His smokeless tobacco use included chew. He reports that he does not currently use alcohol. He reports that he does not use drugs. family history includes Diabetes in his mother; Heart disease in his mother; Hypertension in his father and mother; Stroke in his brother and father. No Known Allergies Current Outpatient Medications on File Prior to Visit  Medication Sig Dispense Refill   allopurinol (ZYLOPRIM) 100 MG tablet TAKE 1 TABLET BY MOUTH TWICE A DAY 180 tablet 1   cyclobenzaprine (FLEXERIL) 5 MG tablet TAKE 1 TABLET BY MOUTH THREE TIMES A DAY AS NEEDED FOR MUSCLE SPASMS 30 tablet 5   diclofenac Sodium (VOLTAREN) 1 % GEL Apply 2 g topically 4 (four) times daily. 50 g 0   gabapentin (NEURONTIN) 100 MG capsule Take 2 capsules (200 mg total) by mouth at bedtime. 180 capsule 3   glipiZIDE (GLUCOTROL XL) 10 MG 24 hr tablet TAKE 1 TABLET BY MOUTH TWICE A DAY 180 tablet 1   hydrochlorothiazide (HYDRODIURIL) 25 MG tablet Take 1 tablet (25 mg total) by mouth daily. 90 tablet 3   indomethacin (INDOCIN) 25 MG capsule TAKE 1 CAPSULE BY MOUTH TWICE A DAY AS NEEDED 180 capsule 1   insulin NPH Human (NOVOLIN N  RELION) 100 UNIT/ML injection Inject 0.85 mLs (85 Units total) into the skin every morning. And syringes 1/day 30 mL 11   lisinopril (ZESTRIL) 20 MG tablet TAKE 1 TABLET BY MOUTH EVERY DAY 90 tablet 1   lovastatin (MEVACOR) 40 MG tablet TAKE 1 TABLET BY MOUTH AT BEDTIME **ANNUAL APPT DUE IN JUNE,MUST SEE PROVIDER FOR REFILLS* 90 tablet 3   meloxicam (MOBIC) 15 MG tablet Take 1 tablet (15 mg total) by mouth daily as needed for pain. 30 tablet 11   metFORMIN (GLUCOPHAGE) 1000 MG tablet TAKE 1 TABLET BY MOUTH TWICE A DAY WITH FOOD 180 tablet 1   pantoprazole (PROTONIX) 40 MG tablet TAKE 1 TABLET BY MOUTH EVERY DAY 90 tablet 3   traMADol (ULTRAM) 50 MG tablet TAKE 1 TABLET BY MOUTH EVERY 6 HOURS AS NEEDED 60 tablet 2   No current facility-administered  medications on file prior to visit.        ROS:  All others reviewed and negative.  Objective        PE:  BP 136/70 (BP Location: Right Arm, Patient Position: Sitting, Cuff Size: Large)   Pulse 75   Temp 98.5 F (36.9 C) (Oral)   Ht 5\' 7"  (1.702 m)   Wt 244 lb (110.7 kg)   SpO2 99%   BMI 38.22 kg/m                 Constitutional: Pt appears in NAD               HENT: Head: NCAT.                Right Ear: External ear normal.                 Left Ear: External ear normal.                Eyes: . Pupils are equal, round, and reactive to light. Conjunctivae and EOM are normal               Nose: without d/c or deformity               Neck: Neck supple. Gross normal ROM               Cardiovascular: Normal rate and regular rhythm.                 Pulmonary/Chest: Effort normal and breath sounds without rales or wheezing.                Abd:  Soft, NT, ND, + BS, no organomegaly               Neurological: Pt is alert. At baseline orientation, motor grossly intact               Skin: Skin is warm. No rashes, no other new lesions, LE edema - trace distal RLE and bilateral pedal edema               Psychiatric: Pt behavior is normal without agitation   Micro: none  Cardiac tracings I have personally interpreted today:  ECG  - sinus bradycardia 56  Pertinent Radiological findings (summarize): none   Lab Results  Component Value Date   WBC 6.6 03/23/2021   HGB 11.4 (L) 03/23/2021   HCT 36.6 (L) 03/23/2021   PLT 308.0 03/23/2021   GLUCOSE 206 (H) 03/23/2021   CHOL 140 03/23/2021   TRIG 155.0 (H) 03/23/2021   HDL  35.80 (L) 03/23/2021   LDLCALC 73 03/23/2021   ALT 32 03/23/2021   AST 21 03/23/2021   NA 134 (L) 03/23/2021   K 4.6 03/23/2021   CL 99 03/23/2021   CREATININE 1.18 03/23/2021   BUN 19 03/23/2021   CO2 26 03/23/2021   TSH 2.21 03/23/2021   PSA 0.78 03/23/2021   HGBA1C 9.8 (A) 03/22/2021   MICROALBUR <0.7 11/06/2019   Assessment/Plan:  Steven [name] is a 60  y.o. Black or African American [2] male with  has a past medical history of Chicken pox, Diabetes mellitus without complication ([place]), Essential hypertension, GERD (gastroesophageal reflux disease), Gouty arthritis, Hypertension, Iron deficiency anemia, and Vitamin D deficiency.  Peripheral edema For ecg today - reviewed -  as suggested per sport med, but recent BNP 4 in the setting of less controlled edema now improved on the HCT; suspect more c/w venous insufficiency - to continue leg elevation, low salt, wt loss, and compression stockings as well during daytime;  Pt declines echo due to cost  Vitamin D deficiency Last vitamin D Lab Results  Component Value Date   VD25OH 27.90 (L) 03/23/2021   Low, to start oral replacement   Iron deficiency anemia With recent mild hgb worsening, also for iron lab today and f/u cbc  Lab Results  Component Value Date   WBC 6.6 03/23/2021   HGB 11.4 (L) 03/23/2021   HCT 36.6 (L) 03/23/2021   MCV 78.1 03/23/2021   PLT 308.0 03/23/2021     Essential hypertension BP Readings from Last 3 Encounters:  04/06/21 136/70  03/23/21 120/70  03/23/21 120/70   Stable, pt to continue medical treatment hct, lisinopril  Followup: No follow-ups on file.  Cathlean Cower, MD 04/06/2021 10:21 AM [facility]

## 2021-04-06 NOTE — Assessment & Plan Note (Addendum)
For ecg today - reviewed -  as suggested per sport med, but recent BNP 4 in the setting of less controlled edema now improved on the HCT; suspect more c/w venous insufficiency - to continue leg elevation, low salt, wt loss, and compression stockings as well during daytime;  Pt declines echo due to cost

## 2021-04-06 NOTE — Assessment & Plan Note (Signed)
With recent mild hgb worsening, also for iron lab today and f/u cbc  Lab Results  Component Value Date   WBC 6.6 03/23/2021   HGB 11.4 (L) 03/23/2021   HCT 36.6 (L) 03/23/2021   MCV 78.1 03/23/2021   PLT 308.0 03/23/2021

## 2021-04-06 NOTE — Assessment & Plan Note (Signed)
Last vitamin D Lab Results  Component Value Date   VD25OH 27.90 (L) 03/23/2021   Low, to start oral replacement

## 2021-04-06 NOTE — Patient Instructions (Signed)
Please take OTC Vitamin D3 at 2000 units per day, indefinitely  Your EKG was OK today  Please continue all other medications as before, and refills have been done if requested.  Please have the pharmacy call with any other refills you may need.  Please continue your efforts at being more active, low cholesterol diet, and weight control.  Please keep your appointments with your specialists as you may have planned  Please go to the LAB at the blood drawing area for the tests to be done  You will be contacted by phone if any changes need to be made immediately.  Otherwise, you will receive a letter about your results with an explanation, but please check with MyChart first.  Please remember to sign up for MyChart if you have not done so, as this will be important to you in the future with finding out test results, communicating by private email, and scheduling acute appointments online when needed.  Please make an Appointment to return in 6 months, or sooner if needed

## 2021-04-12 ENCOUNTER — Other Ambulatory Visit: Payer: Self-pay | Admitting: Internal Medicine

## 2021-04-12 DIAGNOSIS — E1165 Type 2 diabetes mellitus with hyperglycemia: Secondary | ICD-10-CM

## 2021-04-12 NOTE — Telephone Encounter (Signed)
Please refill as per office routine med refill policy (all routine meds to be refilled for 3 mo or monthly (per pt preference) up to one year from last visit, then month to month grace period for 3 mo, then further med refills will have to be denied) ? ?

## 2021-04-23 ENCOUNTER — Other Ambulatory Visit: Payer: Self-pay | Admitting: Internal Medicine

## 2021-04-23 DIAGNOSIS — I10 Essential (primary) hypertension: Secondary | ICD-10-CM

## 2021-06-03 ENCOUNTER — Other Ambulatory Visit: Payer: Self-pay | Admitting: Internal Medicine

## 2021-06-03 DIAGNOSIS — M25571 Pain in right ankle and joints of right foot: Secondary | ICD-10-CM

## 2021-06-03 DIAGNOSIS — M25471 Effusion, right ankle: Secondary | ICD-10-CM

## 2021-06-03 DIAGNOSIS — M109 Gout, unspecified: Secondary | ICD-10-CM

## 2021-06-22 ENCOUNTER — Other Ambulatory Visit: Payer: Self-pay

## 2021-06-22 ENCOUNTER — Encounter: Payer: Self-pay | Admitting: Endocrinology

## 2021-06-22 ENCOUNTER — Ambulatory Visit (INDEPENDENT_AMBULATORY_CARE_PROVIDER_SITE_OTHER): Payer: 59 | Admitting: Endocrinology

## 2021-06-22 VITALS — BP 136/74 | HR 87 | Ht 67.0 in | Wt 246.6 lb

## 2021-06-22 DIAGNOSIS — E1142 Type 2 diabetes mellitus with diabetic polyneuropathy: Secondary | ICD-10-CM | POA: Diagnosis not present

## 2021-06-22 LAB — POCT GLYCOSYLATED HEMOGLOBIN (HGB A1C): Hemoglobin A1C: 8.2 % — AB (ref 4.0–5.6)

## 2021-06-22 MED ORDER — INSULIN NPH (HUMAN) (ISOPHANE) 100 UNIT/ML ~~LOC~~ SUSP: 90.0000 [IU] | SUBCUTANEOUS | 3 refills | Status: DC

## 2021-06-22 NOTE — Progress Notes (Signed)
Subjective:    Patient ID: Steven [name], male    DOB: 1961-02-27, 61 y.o.   MRN: [id number]  HPI Pt returns for f/u of diabetes mellitus: DM type: Insulin-requiring type 2 Dx'ed: 0865 Complications: PN Therapy: insulin since 2021, and 2 oral meds.   DKA: never Severe hypoglycemia: never Pancreatitis: never Pancreatic imaging: never SDOH: he gets discount from [employer]; he is intermittently homeless.   Other: he declines multiple daily injections.   Interval history: no cbg record, but states cbg's vary from 120-300, despite increasing to 90 units qam. He never misses the insulin.  pt states he feels well in general.   Past Medical History:  Diagnosis Date   Chicken pox    Diabetes mellitus without complication ([place])    Essential hypertension    GERD (gastroesophageal reflux disease)    Gouty arthritis    Hypertension    Iron deficiency anemia    Vitamin D deficiency     Past Surgical History:  Procedure Laterality Date   INGUINAL HERNIA REPAIR Right 12/19/2017   Procedure: HERNIA REPAIR INGUINAL RIGHT;  Surgeon: Coralie Keens, MD;  Location: [facility];  Service: General;  Laterality: Right;   INSERTION OF MESH Right 12/19/2017   Procedure: INSERTION OF MESH;  Surgeon: Coralie Keens, MD;  [facility];  Service: General;  Laterality: Right;   MASS EXCISION Right 12/19/2017   Procedure: EXCISION OF RIGHT SCROTAL MASS;  Surgeon: Coralie Keens, MD;  Location: [facility];  Service: General;  Laterality: Right;   TONSILLECTOMY      Social History   Socioeconomic History   Marital status: Single    Spouse name: Not on file   Number of children: 1   Years of education: 12   Highest education level: Not on file  Occupational History   Occupation: Architect  Tobacco Use   Smoking status: Former    Packs/day: 2.00    Years: 25.00    Pack years: 50.00    Types: Cigarettes   Smokeless tobacco: Former    Types: Chew  Substance and Sexual Activity   Alcohol  use: Not Currently    Comment: occasionally   Drug use: No   Sexual activity: Yes    Birth control/protection: Condom  Other Topics Concern   Not on file  Social History Narrative   Fun: Just working   Investment banker, operational of Radio broadcast assistant Strain: Not on file  Food Insecurity: Not on file  Transportation Needs: Not on file  Physical Activity: Not on file  Stress: Not on file  Social Connections: Not on file  Intimate Partner Violence: Not on file    Current Outpatient Medications on File Prior to Visit  Medication Sig Dispense Refill   allopurinol (ZYLOPRIM) 100 MG tablet TAKE 1 TABLET BY MOUTH TWICE A DAY 180 tablet 1   cyclobenzaprine (FLEXERIL) 5 MG tablet TAKE 1 TABLET BY MOUTH THREE TIMES A DAY AS NEEDED FOR MUSCLE SPASMS 30 tablet 5   diclofenac Sodium (VOLTAREN) 1 % GEL Apply 2 g topically 4 (four) times daily. 50 g 0   gabapentin (NEURONTIN) 100 MG capsule Take 2 capsules (200 mg total) by mouth at bedtime. 180 capsule 3   glipiZIDE (GLUCOTROL XL) 10 MG 24 hr tablet TAKE 1 TABLET BY MOUTH TWICE A DAY 180 tablet 1   hydrochlorothiazide (HYDRODIURIL) 25 MG tablet Take 1 tablet (25 mg total) by mouth daily. 90 tablet 3   indomethacin (INDOCIN) 25 MG capsule TAKE 1  CAPSULE BY MOUTH TWICE A DAY AS NEEDED 180 capsule 1   lisinopril (ZESTRIL) 20 MG tablet TAKE 1 TABLET BY MOUTH EVERY DAY 90 tablet 3   lovastatin (MEVACOR) 40 MG tablet TAKE 1 TABLET BY MOUTH AT BEDTIME **ANNUAL APPT DUE IN JUNE,MUST SEE PROVIDER FOR REFILLS* 90 tablet 3   meloxicam (MOBIC) 15 MG tablet Take 1 tablet (15 mg total) by mouth daily as needed for pain. 30 tablet 11   metFORMIN (GLUCOPHAGE) 1000 MG tablet TAKE 1 TABLET BY MOUTH TWICE A DAY WITH FOOD 180 tablet 3   pantoprazole (PROTONIX) 40 MG tablet TAKE 1 TABLET BY MOUTH EVERY DAY 90 tablet 3   traMADol (ULTRAM) 50 MG tablet TAKE 1 TABLET BY MOUTH EVERY 6 HOURS AS NEEDED 60 tablet 2   No current facility-administered medications on file  prior to visit.    No Known Allergies  Family History  Problem Relation Age of Onset   Heart disease Mother    Hypertension Mother    Diabetes Mother    Stroke Father    Hypertension Father    Stroke Brother     BP 136/74 (BP Location: Left Arm, Patient Position: Sitting, Cuff Size: Normal)    Pulse 87    Ht 5\' 7"  (1.702 m)    Wt 246 lb 9.6 oz (111.9 kg)    SpO2 96%    BMI 38.62 kg/m    Review of Systems He denies hypoglycemia.      Objective:   Physical Exam Pulses: dorsalis pedis intact bilat.   MSK: no deformity of the feet CV: 2+ bilat leg edema Skin:  no ulcer on the feet.  normal color and temp on the feet. Neuro: sensation is intact to touch on the feet.  Ext: there is bilateral onychomycosis of the toenails.   Lab Results  Component Value Date   CREATININE 1.19 04/06/2021   BUN 21 04/06/2021   NA 134 (L) 04/06/2021   K 4.2 04/06/2021   CL 100 04/06/2021   CO2 27 04/06/2021    A1c=8.2%    Assessment & Plan:  Insulin-requiring type 2 DM: uncontrolled.    Patient Instructions  check your blood sugar once a day.  vary the time of day when you check, between before the 3 meals, and at bedtime.  also check if you have symptoms of your blood sugar being too high or too low.  please keep a record of the readings and bring it to your next appointment here (or you can bring the meter itself).  You can write it on any piece of paper.  please call us sooner if your blood sugar goes below 70, or if you have a lot of readings over 200. Please increase the insulin to 95 units each morning.   Please continue the same other medications.  On this type of insulin schedule, you should eat meals on a regular schedule (especially lunch).  If a meal is missed or significantly delayed, your blood sugar could go low.   Please come back for a follow-up appointment in 2 months.

## 2021-06-22 NOTE — Patient Instructions (Addendum)
check your blood sugar once a day.  vary the time of day when you check, between before the 3 meals, and at bedtime.  also check if you have symptoms of your blood sugar being too high or too low.  please keep a record of the readings and bring it to your next appointment here (or you can bring the meter itself).  You can write it on any piece of paper.  please call us sooner if your blood sugar goes below 70, or if you have a lot of readings over 200. Please increase the insulin to 95 units each morning.   Please continue the same other medications.  On this type of insulin schedule, you should eat meals on a regular schedule (especially lunch).  If a meal is missed or significantly delayed, your blood sugar could go low.   Please come back for a follow-up appointment in 2 months.

## 2021-07-14 ENCOUNTER — Encounter: Payer: Self-pay | Admitting: Physician Assistant

## 2021-07-14 ENCOUNTER — Ambulatory Visit (INDEPENDENT_AMBULATORY_CARE_PROVIDER_SITE_OTHER): Payer: 59 | Admitting: Physician Assistant

## 2021-07-14 VITALS — BP 136/72 | HR 82 | Ht 67.5 in | Wt 247.0 lb

## 2021-07-14 DIAGNOSIS — D509 Iron deficiency anemia, unspecified: Secondary | ICD-10-CM

## 2021-07-14 NOTE — Progress Notes (Signed)
Chief Complaint: Blood in stool  HPI:    Steven [name] is a 61 year old male with a past medical history of diabetes and reflux, known to Dr. Jeneen Rinks for iron deficiency anemia, who presents clinic today for complaint of blood in his stool.    08/09/2020 patient seen in clinic by Dr. Loletha Carrow for iron deficiency anemia.  At that time had an EGD and colonoscopy recommended EGD and colonoscopy as well as repeat CBC, iron studies and folic acid.  Does not look like he ever got these procedures done.  Labs at that time with an iron of 44, ferritin 70.8 and saturation low at 10.5.  Similar studies back June 2021, June 2020 and November 2019.  Recent hemoglobin was 12.    04/06/2021 CBC with a hemoglobin of 11.4.  Iron panel low percent saturation 11.4, iron 46, TIBC 404.6.  Ferritin normal.    Today, patient presents to clinic and explains that he has iron deficiency anemia.  He does not exactly recall being seen by Dr. Loletha Carrow a year ago for the same problem.  Describes that he is on iron supplementation and occasionally sees "black tarry" stool which has been Hemoccult positive in the past per him.  Nothing has really changed with his symptoms over the past year but he tells me that his primary care doctor wants to have it checked out.    Denies fever, chills, weight loss, seeing blood in his stool, abdominal pain, heartburn or reflux.     Past Medical History:  Diagnosis Date   Chicken pox    Diabetes mellitus without complication ([place])    Essential hypertension    GERD (gastroesophageal reflux disease)    Gouty arthritis    Hypertension    Iron deficiency anemia    Vitamin D deficiency     Past Surgical History:  Procedure Laterality Date   INGUINAL HERNIA REPAIR Right 12/19/2017   Procedure: HERNIA REPAIR INGUINAL RIGHT;  Surgeon: Coralie Keens, MD;  [facility];  Service: General;  Laterality: Right;   INSERTION OF MESH Right 12/19/2017   Procedure: INSERTION OF MESH;  Surgeon: Coralie Keens, MD;  [facility];  Service: General;  Laterality: Right;   MASS EXCISION Right 12/19/2017   Procedure: EXCISION OF RIGHT SCROTAL MASS;  Surgeon: Coralie Keens, MD;  [facility];  Service: General;  Laterality: Right;   TONSILLECTOMY      Current Outpatient Medications  Medication Sig Dispense Refill   allopurinol (ZYLOPRIM) 100 MG tablet TAKE 1 TABLET BY MOUTH TWICE A DAY 180 tablet 1   cyclobenzaprine (FLEXERIL) 5 MG tablet TAKE 1 TABLET BY MOUTH THREE TIMES A DAY AS NEEDED FOR MUSCLE SPASMS 30 tablet 5   diclofenac Sodium (VOLTAREN) 1 % GEL Apply 2 g topically 4 (four) times daily. 50 g 0   gabapentin (NEURONTIN) 100 MG capsule Take 2 capsules (200 mg total) by mouth at bedtime. 180 capsule 3   glipiZIDE (GLUCOTROL XL) 10 MG 24 hr tablet TAKE 1 TABLET BY MOUTH TWICE A DAY 180 tablet 1   hydrochlorothiazide (HYDRODIURIL) 25 MG tablet Take 1 tablet (25 mg total) by mouth daily. 90 tablet 3   indomethacin (INDOCIN) 25 MG capsule TAKE 1 CAPSULE BY MOUTH TWICE A DAY AS NEEDED 180 capsule 1   insulin NPH Human (NOVOLIN N RELION) 100 UNIT/ML injection Inject 0.9 mLs (90 Units total) into the skin every morning. And syringes 1/day 90 mL 3   lisinopril (ZESTRIL) 20 MG tablet TAKE  1 TABLET BY MOUTH EVERY DAY 90 tablet 3   lovastatin (MEVACOR) 40 MG tablet TAKE 1 TABLET BY MOUTH AT BEDTIME **ANNUAL APPT DUE IN JUNE,MUST SEE PROVIDER FOR REFILLS* 90 tablet 3   meloxicam (MOBIC) 15 MG tablet Take 1 tablet (15 mg total) by mouth daily as needed for pain. 30 tablet 11   metFORMIN (GLUCOPHAGE) 1000 MG tablet TAKE 1 TABLET BY MOUTH TWICE A DAY WITH FOOD 180 tablet 3   pantoprazole (PROTONIX) 40 MG tablet TAKE 1 TABLET BY MOUTH EVERY DAY 90 tablet 3   traMADol (ULTRAM) 50 MG tablet TAKE 1 TABLET BY MOUTH EVERY 6 HOURS AS NEEDED 60 tablet 2   No current facility-administered medications for this visit.    Allergies as of 07/14/2021   (No Known Allergies)    Family History  Problem  Relation Age of Onset   Heart disease Mother    Hypertension Mother    Diabetes Mother    Stroke Father    Hypertension Father    Stroke Brother     Social History   Socioeconomic History   Marital status: Single    Spouse name: Not on file   Number of children: 1   Years of education: 12   Highest education level: Not on file  Occupational History   Occupation: Architect  Tobacco Use   Smoking status: Former    Packs/day: 2.00    Years: 25.00    Pack years: 50.00    Types: Cigarettes   Smokeless tobacco: Former    Types: Chew  Substance and Sexual Activity   Alcohol use: Not Currently    Comment: occasionally   Drug use: No   Sexual activity: Yes    Birth control/protection: Condom  Other Topics Concern   Not on file  Social History Narrative   Fun: Just working   Investment banker, operational of Radio broadcast assistant Strain: Not on file  Food Insecurity: Not on file  Transportation Needs: Not on file  Physical Activity: Not on file  Stress: Not on file  Social Connections: Not on file  Intimate Partner Violence: Not on file    Review of Systems:    Constitutional: No weight loss, fever or chills Cardiovascular: No chest pain  Respiratory: No SOB Gastrointestinal: See HPI and otherwise negative   Physical Exam:  Vital signs: BP 136/72    Pulse 82    Ht 5' 7.5" (1.715 m)    Wt 247 lb (112 kg)    BMI 38.11 kg/m    Constitutional:   Pleasant AA male appears to be in NAD, Well developed, Well nourished, alert and cooperative Respiratory: Respirations even and unlabored. Lungs clear to auscultation bilaterally.   No wheezes, crackles, or rhonchi.  Cardiovascular: Normal S1, S2. No MRG. Regular rate and rhythm. No peripheral edema, cyanosis or pallor.  Gastrointestinal:  Soft, nondistended, nontender. No rebound or guarding. Normal bowel sounds. No appreciable masses or hepatomegaly. Rectal:  Not performed.  Psychiatric: Demonstrates good judgement and  reason without abnormal affect or behaviors.  RELEVANT LABS AND IMAGING: CBC    Component Value Date/Time   WBC 7.1 04/06/2021 1031   RBC 4.67 04/06/2021 1031   HGB 11.4 (L) 04/06/2021 1031   HCT 36.7 (L) 04/06/2021 1031   PLT 331.0 04/06/2021 1031   MCV 78.5 04/06/2021 1031   MCH 24.3 (L) 01/30/2018 2037   MCHC 31.0 04/06/2021 1031   RDW 17.7 (H) 04/06/2021 1031   LYMPHSABS 2.5 04/06/2021 1031  MONOABS 0.5 04/06/2021 1031   EOSABS 0.2 04/06/2021 1031   BASOSABS 0.1 04/06/2021 1031    CMP     Component Value Date/Time   NA 134 (L) 04/06/2021 1031   K 4.2 04/06/2021 1031   CL 100 04/06/2021 1031   CO2 27 04/06/2021 1031   GLUCOSE 251 (H) 04/06/2021 1031   BUN 21 04/06/2021 1031   CREATININE 1.19 04/06/2021 1031   CALCIUM 9.1 04/06/2021 1031   PROT 6.8 03/23/2021 0854   ALBUMIN 4.3 03/23/2021 0854   AST 21 03/23/2021 0854   ALT 32 03/23/2021 0854   ALKPHOS 59 03/23/2021 0854   BILITOT 0.4 03/23/2021 0854   GFRNONAA >60 01/30/2018 2037   GFRAA >60 01/30/2018 2037    Assessment: 1.  Iron deficiency anemia: Seen a year ago by Dr. Loletha Carrow for the same and recommendations were for EGD and colonoscopy to consider GI source  Plan: 1.  Scheduled patient for diagnostic EGD and colonoscopy in the [place] with Dr. Loletha Carrow.  This was scheduled into April, but this problem has been at least for the past year with no immediate changes.  I feel this timing is appropriate.  Did provide the patient a detailed list of risks for the procedures and he agrees to proceed.  Patient is appropriate for endoscopic procedure(s) in the ambulatory ([place]) setting. 2.  Continue iron supplementation per recommendations from PCP 3.  Patient to follow in clinic per recommendations from Dr. Loletha Carrow after time of procedures.  Ellouise Newer, PA-C [facility] 07/14/2021, 10:28 AM  Cc: [name], MD

## 2021-07-14 NOTE — Patient Instructions (Signed)
You have been scheduled for an endoscopy and colonoscopy. Please follow the written instructions given to you at your visit today. Please pick up your prep supplies at the pharmacy within the next 1-3 days. If you use inhalers (even only as needed), please bring them with you on the day of your procedure.  If you are age 64 or older, your body mass index should be between 23-30. Your Body mass index is 38.11 kg/m. If this is out of the aforementioned range listed, please consider follow up with your Primary Care Provider.  If you are age 57 or younger, your body mass index should be between 19-25. Your Body mass index is 38.11 kg/m. If this is out of the aformentioned range listed, please consider follow up with your Primary Care Provider.   ________________________________________________________  The [facility] providers would like to encourage you to use [facility] to communicate with providers for non-urgent requests or questions.  Due to long hold times on the telephone, sending your provider a message by [facility] ([facility]) may be a faster and more efficient way to get a response.  Please allow 48 business hours for a response.  Please remember that this is for non-urgent requests.  _______________________________________________________

## 2021-07-18 NOTE — Progress Notes (Signed)
____________________________________________________________  Attending physician addendum:  Thank you for sending this case to me. I have reviewed the entire note and agree with the plan.  If a sooner double length slot for EGD/colonoscopy opens up with enough notice, we will contact him and offer it.  In addition, this patient's last CBC was over 3 months ago.  Please send the patient for a CBC this week or next.  Wilfrid Lund, MD  ____________________________________________________________

## 2021-07-19 ENCOUNTER — Telehealth: Payer: Self-pay

## 2021-07-19 DIAGNOSIS — D509 Iron deficiency anemia, unspecified: Secondary | ICD-10-CM

## 2021-07-19 NOTE — Telephone Encounter (Signed)
-----   Message from Levin Erp, [place] sent at 07/19/2021  9:06 AM EST ----- Regarding: Needs repeat CBC Please order repeat CBC.  Thanks-JLL ----- Message ----- From: Doran Stabler, MD Sent: 07/18/2021  11:46 AM EST To: Levin Erp, PA     ----- Message ----- From: Levin Erp, [place] Sent: 07/14/2021  11:10 AM EST To: Doran Stabler, MD

## 2021-07-19 NOTE — Telephone Encounter (Signed)
Called and spoke with patient regarding repeat labs. Pt has been advised that his last CBC was over 3 months ago. He is aware that he can stop by at his convenience this week to have labs drawn. He knows that no appt is necessary. Pt verbalized understanding and had no concerns at the end of the call.   Lab order in epic.

## 2021-07-20 ENCOUNTER — Other Ambulatory Visit (INDEPENDENT_AMBULATORY_CARE_PROVIDER_SITE_OTHER): Payer: 59

## 2021-07-20 DIAGNOSIS — D509 Iron deficiency anemia, unspecified: Secondary | ICD-10-CM

## 2021-07-20 LAB — CBC WITH DIFFERENTIAL/PLATELET
Basophils Absolute: 0 10*3/uL (ref 0.0–0.1)
Basophils Relative: 0.6 % (ref 0.0–3.0)
Eosinophils Absolute: 0.2 10*3/uL (ref 0.0–0.7)
Eosinophils Relative: 1.9 % (ref 0.0–5.0)
HCT: 35.6 % — ABNORMAL LOW (ref 39.0–52.0)
Hemoglobin: 11.3 g/dL — ABNORMAL LOW (ref 13.0–17.0)
Lymphocytes Relative: 31.4 % (ref 12.0–46.0)
Lymphs Abs: 2.5 10*3/uL (ref 0.7–4.0)
MCHC: 31.8 g/dL (ref 30.0–36.0)
MCV: 77.6 fl — ABNORMAL LOW (ref 78.0–100.0)
Monocytes Absolute: 0.6 10*3/uL (ref 0.1–1.0)
Monocytes Relative: 7.6 % (ref 3.0–12.0)
Neutro Abs: 4.7 10*3/uL (ref 1.4–7.7)
Neutrophils Relative %: 58.5 % (ref 43.0–77.0)
Platelets: 295 10*3/uL (ref 150.0–400.0)
RBC: 4.59 Mil/uL (ref 4.22–5.81)
RDW: 18.1 % — ABNORMAL HIGH (ref 11.5–15.5)
WBC: 8.1 10*3/uL (ref 4.0–10.5)

## 2021-07-22 ENCOUNTER — Other Ambulatory Visit: Payer: Self-pay

## 2021-07-22 MED ORDER — DOCUSATE SODIUM 100 MG PO CAPS
200.0000 mg | ORAL_CAPSULE | Freq: Every day | ORAL | 0 refills | Status: DC | PRN
Start: 2021-07-22 — End: 2021-11-25

## 2021-07-22 MED ORDER — FERROUS SULFATE 325 (65 FE) MG PO TBEC: 325.0000 mg | DELAYED_RELEASE_TABLET | Freq: Two times a day (BID) | ORAL | 0 refills | Status: DC

## 2021-07-22 NOTE — Progress Notes (Signed)
Hemoglobin this week stable at 11.3. Thank you for getting that done  - HD

## 2021-08-06 ENCOUNTER — Other Ambulatory Visit: Payer: Self-pay | Admitting: Internal Medicine

## 2021-08-06 NOTE — Telephone Encounter (Signed)
Please refill as per office routine med refill policy (all routine meds to be refilled for 3 mo or monthly (per pt preference) up to one year from last visit, then month to month grace period for 3 mo, then further med refills will have to be denied) ? ?

## 2021-08-24 ENCOUNTER — Encounter: Payer: Self-pay | Admitting: Endocrinology

## 2021-08-24 ENCOUNTER — Ambulatory Visit: Payer: 59 | Admitting: Endocrinology

## 2021-08-24 ENCOUNTER — Other Ambulatory Visit: Payer: Self-pay

## 2021-08-24 VITALS — BP 158/80 | HR 71 | Ht 67.5 in | Wt 248.4 lb

## 2021-08-24 DIAGNOSIS — E1142 Type 2 diabetes mellitus with diabetic polyneuropathy: Secondary | ICD-10-CM

## 2021-08-24 LAB — POCT GLYCOSYLATED HEMOGLOBIN (HGB A1C): Hemoglobin A1C: 8.5 % — AB (ref 4.0–5.6)

## 2021-08-24 MED ORDER — INSULIN NPH (HUMAN) (ISOPHANE) 100 UNIT/ML ~~LOC~~ SUSP: 110.0000 [IU] | SUBCUTANEOUS | 3 refills | Status: DC

## 2021-08-24 NOTE — Patient Instructions (Addendum)
check your blood sugar once a day.  vary the time of day when you check, between before the 3 meals, and at bedtime.  also check if you have symptoms of your blood sugar being too high or too low.  please keep a record of the readings and bring it to your next appointment here (or you can bring the meter itself).  You can write it on any piece of paper.  please call us sooner if your blood sugar goes below 70, or if you have a lot of readings over 200.   ?Please increase the insulin to 110 units each morning.   ?Please continue the same other medications.  ?On this type of insulin schedule, you should eat meals on a regular schedule (especially lunch).  If a meal is missed or significantly delayed, your blood sugar could go low.   ?Please come back for a follow-up appointment in 3 months.   ? ?

## 2021-08-24 NOTE — Progress Notes (Signed)
? ?Subjective:  ? ? Patient ID: Steven [name], male    DOB: Jan 03, 1961, 61 y.o.   MRN: [id number] ? ?HPI ?Pt returns for f/u of diabetes mellitus: ?DM type: Insulin-requiring type 2 ?Dx'ed: 2010 ?Complications: PN ?Therapy: insulin since 2021, and 2 oral meds.   ?DKA: never ?Severe hypoglycemia: never ?Pancreatitis: never ?Pancreatic imaging: never ?SDOH: he gets discount from [place]; he is intermittently homeless.   ?Other: he declines multiple daily injections.   ?Interval history: no cbg record, but states cbg's vary from 123-260, despite increasing to 100 units qam. He never misses the insulin.  pt states he feels well in general.   ?Past Medical History:  ?Diagnosis Date  ? Chicken pox   ? Diabetes mellitus without complication ([place])   ? Essential hypertension   ? GERD (gastroesophageal reflux disease)   ? Gouty arthritis   ? Hypertension   ? Iron deficiency anemia   ? Vitamin D deficiency   ? ? ?Past Surgical History:  ?Procedure Laterality Date  ? INGUINAL HERNIA REPAIR Right 12/19/2017  ? Procedure: HERNIA REPAIR INGUINAL RIGHT;  Surgeon: Coralie Keens, MD;  [facility]: [facility];  Service: General;  Laterality: Right;  ? INSERTION OF MESH Right 12/19/2017  ? Procedure: INSERTION OF MESH;  Surgeon: Coralie Keens, MD;  [facility]: [facility];  Service: General;  Laterality: Right;  ? MASS EXCISION Right 12/19/2017  ? Procedure: EXCISION OF RIGHT SCROTAL MASS;  Surgeon: Coralie Keens, MD;  Location: [facility];  Service: General;  Laterality: Right;  ? TONSILLECTOMY    ? ? ?Social History  ? ?Socioeconomic History  ? Marital status: Divorced  ?  Spouse name: Not on file  ? Number of children: 1  ? Years of education: 77  ? Highest education level: Not on file  ?Occupational History  ? Occupation: retired  ?Tobacco Use  ? Smoking status: Former  ?  Packs/day: 2.00  ?  Years: 25.00  ?  Pack years: 50.00  ?  Types: Cigarettes  ? Smokeless tobacco: Former  ?  Types: Chew  ?Vaping Use  ? Vaping Use: Never used   ?Substance and Sexual Activity  ? Alcohol use: Not Currently  ? Drug use: No  ? Sexual activity: Yes  ?  Birth control/protection: Condom  ?Other Topics Concern  ? Not on file  ?Social History Narrative  ? Fun: Just working  ? ?Social Determinants of Health  ? ?Financial Resource Strain: Not on file  ?Food Insecurity: Not on file  ?Transportation Needs: Not on file  ?Physical Activity: Not on file  ?Stress: Not on file  ?Social Connections: Not on file  ?Intimate Partner Violence: Not on file  ? ? ?Current Outpatient Medications on File Prior to Visit  ?Medication Sig Dispense Refill  ? allopurinol (ZYLOPRIM) 100 MG tablet TAKE 1 TABLET BY MOUTH TWICE A DAY 180 tablet 1  ? cyclobenzaprine (FLEXERIL) 5 MG tablet TAKE 1 TABLET BY MOUTH THREE TIMES A DAY AS NEEDED FOR MUSCLE SPASMS 30 tablet 5  ? diclofenac Sodium (VOLTAREN) 1 % GEL Apply 2 g topically 4 (four) times daily. 50 g 0  ? docusate sodium (COLACE) 100 MG capsule Take 2 capsules (200 mg total) by mouth daily as needed for mild constipation. 10 capsule 0  ? ferrous sulfate 325 (65 FE) MG EC tablet Take 1 tablet (325 mg total) by mouth 2 (two) times daily. 60 tablet 0  ? gabapentin (NEURONTIN) 100 MG capsule Take 2 capsules (200 mg total) by mouth  at bedtime. 180 capsule 3  ? hydrochlorothiazide (HYDRODIURIL) 25 MG tablet Take 1 tablet (25 mg total) by mouth daily. 90 tablet 3  ? indomethacin (INDOCIN) 25 MG capsule TAKE 1 CAPSULE BY MOUTH TWICE A DAY AS NEEDED 180 capsule 1  ? lisinopril (ZESTRIL) 20 MG tablet TAKE 1 TABLET BY MOUTH EVERY DAY 90 tablet 3  ? lovastatin (MEVACOR) 40 MG tablet TAKE 1 TABLET BY MOUTH AT BEDTIME **ANNUAL APPT DUE IN JUNE,MUST SEE PROVIDER FOR REFILLS* 90 tablet 3  ? meloxicam (MOBIC) 15 MG tablet Take 1 tablet (15 mg total) by mouth daily as needed for pain. 30 tablet 11  ? metFORMIN (GLUCOPHAGE) 1000 MG tablet TAKE 1 TABLET BY MOUTH TWICE A DAY WITH FOOD 180 tablet 3  ? pantoprazole (PROTONIX) 40 MG tablet TAKE 1 TABLET BY MOUTH  EVERY DAY 90 tablet 3  ? traMADol (ULTRAM) 50 MG tablet TAKE 1 TABLET BY MOUTH EVERY 6 HOURS AS NEEDED 60 tablet 2  ? ?No current facility-administered medications on file prior to visit.  ? ? ?No Known Allergies ? ?Family History  ?Problem Relation Age of Onset  ? Heart disease Mother   ? Hypertension Mother   ? Diabetes Mother   ? Stroke Father   ? Hypertension Father   ? Stroke Brother   ? ? ?BP (!) 158/80 (BP Location: Left Arm, Patient Position: Sitting, Cuff Size: Normal)   Pulse 71   Ht 5' 7.5" (1.715 m)   Wt 248 lb 6.4 oz (112.7 kg)   SpO2 96%   BMI 38.33 kg/m?  ? ? ?Review of Systems ?He denies hypoglycemia.   ?   ?Objective:  ? Physical Exam ?VITAL SIGNS:  See vs page ?GENERAL: no distress.   ? ? ?Lab Results  ?Component Value Date  ? HGBA1C 8.5 (A) 08/24/2021  ? ?Lab Results  ?Component Value Date  ? CREATININE 1.19 04/06/2021  ? BUN 21 04/06/2021  ? NA 134 (L) 04/06/2021  ? K 4.2 04/06/2021  ? CL 100 04/06/2021  ? CO2 27 04/06/2021  ? ?   ?Assessment & Plan:  ?Insulin-requiring type 2 DM: uncontrolled.   ? ? ?Patient Instructions  ?check your blood sugar once a day.  vary the time of day when you check, between before the 3 meals, and at bedtime.  also check if you have symptoms of your blood sugar being too high or too low.  please keep a record of the readings and bring it to your next appointment here (or you can bring the meter itself).  You can write it on any piece of paper.  please call us sooner if your blood sugar goes below 70, or if you have a lot of readings over 200.   ?Please increase the insulin to 110 units each morning.   ?Please continue the same other medications.  ?On this type of insulin schedule, you should eat meals on a regular schedule (especially lunch).  If a meal is missed or significantly delayed, your blood sugar could go low.   ?Please come back for a follow-up appointment in 3 months.   ? ? ? ?

## 2021-08-25 ENCOUNTER — Other Ambulatory Visit: Payer: Self-pay | Admitting: Internal Medicine

## 2021-08-25 NOTE — Telephone Encounter (Signed)
Please refill as per office routine med refill policy (all routine meds to be refilled for 3 mo or monthly (per pt preference) up to one year from last visit, then month to month grace period for 3 mo, then further med refills will have to be denied) ? ?

## 2021-09-01 ENCOUNTER — Encounter: Payer: Self-pay | Admitting: Gastroenterology

## 2021-09-01 ENCOUNTER — Ambulatory Visit (AMBULATORY_SURGERY_CENTER): Payer: 59 | Admitting: Gastroenterology

## 2021-09-01 ENCOUNTER — Encounter: Payer: 59 | Admitting: Gastroenterology

## 2021-09-01 VITALS — BP 126/77 | HR 74 | Temp 98.7°F | Resp 26 | Ht 67.0 in | Wt 247.0 lb

## 2021-09-01 DIAGNOSIS — K641 Second degree hemorrhoids: Secondary | ICD-10-CM

## 2021-09-01 DIAGNOSIS — K449 Diaphragmatic hernia without obstruction or gangrene: Secondary | ICD-10-CM

## 2021-09-01 DIAGNOSIS — K31819 Angiodysplasia of stomach and duodenum without bleeding: Secondary | ICD-10-CM | POA: Diagnosis not present

## 2021-09-01 DIAGNOSIS — K259 Gastric ulcer, unspecified as acute or chronic, without hemorrhage or perforation: Secondary | ICD-10-CM | POA: Diagnosis not present

## 2021-09-01 DIAGNOSIS — K2980 Duodenitis without bleeding: Secondary | ICD-10-CM | POA: Diagnosis not present

## 2021-09-01 DIAGNOSIS — D509 Iron deficiency anemia, unspecified: Secondary | ICD-10-CM

## 2021-09-01 DIAGNOSIS — K2970 Gastritis, unspecified, without bleeding: Secondary | ICD-10-CM

## 2021-09-01 MED ORDER — SODIUM CHLORIDE 0.9 % IV SOLN: 500.0000 mL | Freq: Once | INTRAVENOUS | Status: DC

## 2021-09-01 NOTE — Progress Notes (Signed)
1336 Robinul 0.1 mg IV given due large amount of secretions upon assessment.  MD made aware, vss 

## 2021-09-01 NOTE — Progress Notes (Signed)
? ?GASTROENTEROLOGY PROCEDURE H&P NOTE  ? ?Primary Care Physician: ?[name], MD ? ?HPI: ?Steven [name] is a 61 y.o. male who presents for EGD/Colonoscopy for evaluation of IDA. ? ?Past Medical History:  ?Diagnosis Date  ? Chicken pox   ? Diabetes mellitus without complication ([place])   ? Essential hypertension   ? GERD (gastroesophageal reflux disease)   ? Gouty arthritis   ? Hypertension   ? Iron deficiency anemia   ? Vitamin D deficiency   ? ?Past Surgical History:  ?Procedure Laterality Date  ? INGUINAL HERNIA REPAIR Right 12/19/2017  ? Procedure: HERNIA REPAIR INGUINAL RIGHT;  Surgeon: Coralie Keens, MD;  Location: [facility];  Service: General;  Laterality: Right;  ? INSERTION OF MESH Right 12/19/2017  ? Procedure: INSERTION OF MESH;  Surgeon: Coralie Keens, MD;  Location: [facility];  Service: General;  Laterality: Right;  ? MASS EXCISION Right 12/19/2017  ? Procedure: EXCISION OF RIGHT SCROTAL MASS;  Surgeon: Coralie Keens, MD;  Location: [facility];  Service: General;  Laterality: Right;  ? TONSILLECTOMY    ? ?Current Outpatient Medications  ?Medication Sig Dispense Refill  ? allopurinol (ZYLOPRIM) 100 MG tablet TAKE 1 TABLET BY MOUTH TWICE A DAY 180 tablet 1  ? cyclobenzaprine (FLEXERIL) 5 MG tablet TAKE 1 TABLET BY MOUTH THREE TIMES A DAY AS NEEDED FOR MUSCLE SPASMS 30 tablet 5  ? docusate sodium (COLACE) 100 MG capsule Take 2 capsules (200 mg total) by mouth daily as needed for mild constipation. 10 capsule 0  ? ferrous sulfate 325 (65 FE) MG EC tablet Take 1 tablet (325 mg total) by mouth 2 (two) times daily. 60 tablet 0  ? gabapentin (NEURONTIN) 100 MG capsule Take 2 capsules (200 mg total) by mouth at bedtime. 180 capsule 3  ? glipiZIDE (GLUCOTROL XL) 10 MG 24 hr tablet TAKE 1 TABLET BY MOUTH TWICE A DAY 180 tablet 0  ? hydrochlorothiazide (HYDRODIURIL) 25 MG tablet Take 1 tablet (25 mg total) by mouth daily. 90 tablet 3  ? indomethacin (INDOCIN) 25 MG capsule TAKE 1 CAPSULE BY MOUTH TWICE A DAY AS  NEEDED 180 capsule 1  ? insulin NPH Human (NOVOLIN N RELION) 100 UNIT/ML injection Inject 1.1 mLs (110 Units total) into the skin every morning. And syringes 1/day 110 mL 3  ? lisinopril (ZESTRIL) 20 MG tablet TAKE 1 TABLET BY MOUTH EVERY DAY 90 tablet 3  ? lovastatin (MEVACOR) 40 MG tablet TAKE 1 TABLET BY MOUTH AT BEDTIME **ANNUAL APPT DUE IN JUNE,MUST SEE PROVIDER FOR REFILLS* 90 tablet 3  ? metFORMIN (GLUCOPHAGE) 1000 MG tablet TAKE 1 TABLET BY MOUTH TWICE A DAY WITH FOOD 180 tablet 3  ? pantoprazole (PROTONIX) 40 MG tablet TAKE 1 TABLET BY MOUTH EVERY DAY 90 tablet 3  ? diclofenac Sodium (VOLTAREN) 1 % GEL Apply 2 g topically 4 (four) times daily. (Patient not taking: Reported on 09/01/2021) 50 g 0  ? meloxicam (MOBIC) 15 MG tablet Take 1 tablet (15 mg total) by mouth daily as needed for pain. (Patient not taking: Reported on 09/01/2021) 30 tablet 11  ? traMADol (ULTRAM) 50 MG tablet TAKE 1 TABLET BY MOUTH EVERY 6 HOURS AS NEEDED (Patient not taking: Reported on 09/01/2021) 60 tablet 2  ? ?Current Facility-Administered Medications  ?Medication Dose Route Frequency Provider Last Rate Last Admin  ? 0.9 %  sodium chloride infusion  500 mL Intravenous Once Mansouraty, Telford Nab., MD      ? ? ?Current Outpatient Medications:  ?  allopurinol (ZYLOPRIM) 100 MG tablet, TAKE 1 TABLET BY MOUTH TWICE A DAY, Disp: 180 tablet, Rfl: 1 ?  cyclobenzaprine (FLEXERIL) 5 MG tablet, TAKE 1 TABLET BY MOUTH THREE TIMES A DAY AS NEEDED FOR MUSCLE SPASMS, Disp: 30 tablet, Rfl: 5 ?  docusate sodium (COLACE) 100 MG capsule, Take 2 capsules (200 mg total) by mouth daily as needed for mild constipation., Disp: 10 capsule, Rfl: 0 ?  ferrous sulfate 325 (65 FE) MG EC tablet, Take 1 tablet (325 mg total) by mouth 2 (two) times daily., Disp: 60 tablet, Rfl: 0 ?  gabapentin (NEURONTIN) 100 MG capsule, Take 2 capsules (200 mg total) by mouth at bedtime., Disp: 180 capsule, Rfl: 3 ?  glipiZIDE (GLUCOTROL XL) 10 MG 24 hr tablet, TAKE 1 TABLET BY MOUTH  TWICE A DAY, Disp: 180 tablet, Rfl: 0 ?  hydrochlorothiazide (HYDRODIURIL) 25 MG tablet, Take 1 tablet (25 mg total) by mouth daily., Disp: 90 tablet, Rfl: 3 ?  indomethacin (INDOCIN) 25 MG capsule, TAKE 1 CAPSULE BY MOUTH TWICE A DAY AS NEEDED, Disp: 180 capsule, Rfl: 1 ?  insulin NPH Human (NOVOLIN N RELION) 100 UNIT/ML injection, Inject 1.1 mLs (110 Units total) into the skin every morning. And syringes 1/day, Disp: 110 mL, Rfl: 3 ?  lisinopril (ZESTRIL) 20 MG tablet, TAKE 1 TABLET BY MOUTH EVERY DAY, Disp: 90 tablet, Rfl: 3 ?  lovastatin (MEVACOR) 40 MG tablet, TAKE 1 TABLET BY MOUTH AT BEDTIME **ANNUAL APPT DUE IN JUNE,MUST SEE PROVIDER FOR REFILLS*, Disp: 90 tablet, Rfl: 3 ?  metFORMIN (GLUCOPHAGE) 1000 MG tablet, TAKE 1 TABLET BY MOUTH TWICE A DAY WITH FOOD, Disp: 180 tablet, Rfl: 3 ?  pantoprazole (PROTONIX) 40 MG tablet, TAKE 1 TABLET BY MOUTH EVERY DAY, Disp: 90 tablet, Rfl: 3 ?  diclofenac Sodium (VOLTAREN) 1 % GEL, Apply 2 g topically 4 (four) times daily. (Patient not taking: Reported on 09/01/2021), Disp: 50 g, Rfl: 0 ?  meloxicam (MOBIC) 15 MG tablet, Take 1 tablet (15 mg total) by mouth daily as needed for pain. (Patient not taking: Reported on 09/01/2021), Disp: 30 tablet, Rfl: 11 ?  traMADol (ULTRAM) 50 MG tablet, TAKE 1 TABLET BY MOUTH EVERY 6 HOURS AS NEEDED (Patient not taking: Reported on 09/01/2021), Disp: 60 tablet, Rfl: 2 ? ?Current Facility-Administered Medications:  ?  0.9 %  sodium chloride infusion, 500 mL, Intravenous, Once, Mansouraty, Telford Nab., MD ?No Known Allergies ?Family History  ?Problem Relation Age of Onset  ? Heart disease Mother   ? Hypertension Mother   ? Diabetes Mother   ? Stroke Father   ? Hypertension Father   ? Stroke Brother   ? ?Social History  ? ?Socioeconomic History  ? Marital status: Divorced  ?  Spouse name: Not on file  ? Number of children: 1  ? Years of education: 69  ? Highest education level: Not on file  ?Occupational History  ? Occupation: retired  ?Tobacco  Use  ? Smoking status: Former  ?  Packs/day: 2.00  ?  Years: 25.00  ?  Pack years: 50.00  ?  Types: Cigarettes  ? Smokeless tobacco: Former  ?  Types: Chew  ?Vaping Use  ? Vaping Use: Never used  ?Substance and Sexual Activity  ? Alcohol use: Not Currently  ? Drug use: No  ? Sexual activity: Yes  ?  Birth control/protection: Condom  ?Other Topics Concern  ? Not on file  ?Social History Narrative  ? Fun: Just working  ? ?Social Determinants  of Health  ? ?Financial Resource Strain: Not on file  ?Food Insecurity: Not on file  ?Transportation Needs: Not on file  ?Physical Activity: Not on file  ?Stress: Not on file  ?Social Connections: Not on file  ?Intimate Partner Violence: Not on file  ? ? ?Physical Exam: ?Today's Vitals  ? 09/01/21 1336 09/01/21 1340 09/01/21 1345 09/01/21 1350  ?BP: 124/82 119/79 126/79 124/84  ?Pulse: 64 66 64 63  ?Resp: 20 (!) 27 (!) 25 20  ?Temp:      ?SpO2: 98% 98% 98% 97%  ?Weight:      ?Height:      ? ?Body mass index is 38.69 kg/m?. ?GEN: NAD ?EYE: Sclerae anicteric ?ENT: MMM ?CV: Non-tachycardic ?GI: Soft, NT/ND ?NEURO:  Alert & Oriented x 3 ? ?Lab Results: ?No results for input(s): WBC, HGB, HCT, PLT in the last 72 hours. ?BMET ?No results for input(s): NA, K, CL, CO2, GLUCOSE, BUN, CREATININE, CALCIUM in the last 72 hours. ?LFT ?No results for input(s): PROT, ALBUMIN, AST, ALT, ALKPHOS, BILITOT, BILIDIR, IBILI in the last 72 hours. ?PT/INR ?No results for input(s): LABPROT, INR in the last 72 hours. ? ? ?Impression / Plan: ?This is a 61 y.o.male who presents for EGD/Colonoscopy for evaluation of IDA. ? ?The risks and benefits of endoscopic evaluation/treatment were discussed with the patient and/or family; these include but are not limited to the risk of perforation, infection, bleeding, missed lesions, lack of diagnosis, severe illness requiring hospitalization, as well as anesthesia and sedation related illnesses.  The patient's history has been reviewed, patient examined, no change in  status, and deemed stable for procedure.  The patient and/or family is agreeable to proceed.  ? ? ?Justice Britain, MD ?[facility] ?Advanced Endoscopy ?Office # [phone] ? ?

## 2021-09-01 NOTE — Progress Notes (Signed)
Called to room to assist during endoscopic procedure.  Patient ID and intended procedure confirmed with present staff. Received instructions for my participation in the procedure from the performing physician.  

## 2021-09-01 NOTE — Patient Instructions (Addendum)
Handouts on GERD and gastritis given. ? ?Await pathology results. ? ?YOU HAD AN ENDOSCOPIC PROCEDURE TODAY AT [facility]:   Refer to the procedure report that was given to you for any specific questions about what was found during the examination.  If the procedure report does not answer your questions, please call your gastroenterologist to clarify.  If you requested that your care partner not be given the details of your procedure findings, then the procedure report has been included in a sealed envelope for you to review at your convenience later. ? ?YOU SHOULD EXPECT: Some feelings of bloating in the abdomen. Passage of more gas than usual.  Walking can help get rid of the air that was put into your GI tract during the procedure and reduce the bloating. If you had a lower endoscopy (such as a colonoscopy or flexible sigmoidoscopy) you may notice spotting of blood in your stool or on the toilet paper. If you underwent a bowel prep for your procedure, you may not have a normal bowel movement for a few days. ? ?Please Note:  You might notice some irritation and congestion in your nose or some drainage.  This is from the oxygen used during your procedure.  There is no need for concern and it should clear up in a day or so. ? ?SYMPTOMS TO REPORT IMMEDIATELY: ? ?Following lower endoscopy (colonoscopy or flexible sigmoidoscopy): ? Excessive amounts of blood in the stool ? Significant tenderness or worsening of abdominal pains ? Swelling of the abdomen that is new, acute ? Fever of 100?F or higher ? ?Following upper endoscopy (EGD) ? Vomiting of blood or coffee ground material ? New chest pain or pain under the shoulder blades ? Painful or persistently difficult swallowing ? New shortness of breath ? Fever of 100?F or higher ? Black, tarry-looking stools ? ?For urgent or emergent issues, a gastroenterologist can be reached at any hour by calling [phone]. ?Do not use MyChart messaging for urgent  concerns.  ? ? ?DIET:  We do recommend a small meal at first, but then you may proceed to your regular diet.  Drink plenty of fluids but you should avoid alcoholic beverages for 24 hours. ? ?ACTIVITY:  You should plan to take it easy for the rest of today and you should NOT DRIVE or use heavy machinery until tomorrow (because of the sedation medicines used during the test).   ? ?FOLLOW UP: ?Our staff will call the number listed on your records 48-72 hours following your procedure to check on you and address any questions or concerns that you may have regarding the information given to you following your procedure. If we do not reach you, we will leave a message.  We will attempt to reach you two times.  During this call, we will ask if you have developed any symptoms of COVID 19. If you develop any symptoms (ie: fever, flu-like symptoms, shortness of breath, cough etc.) before then, please call [phone].  If you test positive for Covid 19 in the 2 weeks post procedure, please call and report this information to [place].   ? ?If any biopsies were taken you will be contacted by phone or by letter within the next 1-3 weeks.  Please call us at [phone] if you have not heard about the biopsies in 3 weeks.  ? ? ?SIGNATURES/CONFIDENTIALITY: ?You and/or your care partner have signed paperwork which will be entered into your electronic medical record.  These signatures attest  to the fact that that the information above on your After Visit Summary has been reviewed and is understood.  Full responsibility of the confidentiality of this discharge information lies with you and/or your care-partner.  ?

## 2021-09-01 NOTE — Op Note (Signed)
[place] ?Patient Name: Steven [name] ?Procedure Date: 09/01/2021 1:35 PM ?MRN: [id number] ?Endoscopist: Justice Britain , MD ?Age: 61 ?Referring MD:  ?Date of Birth: 1960/07/21 ?Gender: Male ?Account #: [id number] ?Procedure:                Upper GI endoscopy ?Indications:              Iron deficiency anemia, Occult blood in stool ?Medicines:                Monitored Anesthesia Care ?Procedure:                Pre-Anesthesia Assessment: ?                          - Prior to the procedure, a History and Physical  ?                          was performed, and patient medications and  ?                          allergies were reviewed. The patient's tolerance of  ?                          previous anesthesia was also reviewed. The risks  ?                          and benefits of the procedure and the sedation  ?                          options and risks were discussed with the patient.  ?                          All questions were answered, and informed consent  ?                          was obtained. Prior Anticoagulants: The patient has  ?                          taken no previous anticoagulant or antiplatelet  ?                          agents. ASA Grade Assessment: III - A patient with  ?                          severe systemic disease. After reviewing the risks  ?                          and benefits, the patient was deemed in  ?                          satisfactory condition to undergo the procedure. ?                          After obtaining informed consent, the endoscope was  ?  passed under direct vision. Throughout the  ?                          procedure, the patient's blood pressure, pulse, and  ?                          oxygen saturations were monitored continuously. The  ?                          Endoscope was introduced through the mouth, and  ?                          advanced to the second part of duodenum. The upper  ?                          GI  endoscopy was accomplished without difficulty.  ?                          The patient tolerated the procedure. ?Scope In: ?Scope Out: ?Findings:                 No gross lesions were noted in the entire esophagus. ?                          The Z-line was irregular and was found 37 cm from  ?                          the incisors. ?                          A 3 cm hiatal hernia was present. ?                          Three small angiodysplastic lesions with no  ?                          bleeding were found in the gastric antrum. ?                          Patchy mild inflammation characterized by erosions,  ?                          friability and granularity was found in the entire  ?                          examined stomach. Biopsies were taken with a cold  ?                          forceps for histology and Helicobacter pylori  ?                          testing. ?                          No gross lesions were noted in the duodenal  bulb,  ?                          in the first portion of the duodenum and in the  ?                          second portion of the duodenum. Biopsies were taken  ?                          with a cold forceps for histology. ?Complications:            No immediate complications. ?Estimated Blood Loss:     Estimated blood loss was minimal. ?Impression:               - No gross lesions in esophagus. ?                          - Z-line irregular, 37 cm from the incisors. ?                          - 3 cm hiatal hernia. ?                          - Three non-bleeding angiodysplastic lesions in the  ?                          stomach. ?                          - Gastritis. Biopsied. ?                          - No gross lesions in the duodenal bulb, in the  ?                          first portion of the duodenum and in the second  ?                          portion of the duodenum. Biopsied. ?Recommendation:           - Proceed to scheduled colonoscopy. ?                          -  Continue present medications. ?                          - Await pathology results. ?                          - Continue present medications. ?                          - Patient likely to benefit from consideration of  ?                          repeat EGD in hospital-based setting for APC of  ?  AVMs. ?                          - The findings and recommendations were discussed  ?                          with the patient. ?Justice Britain, MD ?09/01/2021 2:49:39 PM ?

## 2021-09-01 NOTE — Op Note (Signed)
[place] ?Patient Name: Steven [name] ?Procedure Date: 09/01/2021 1:34 PM ?MRN: [id number] ?Endoscopist: Justice Britain , MD ?Age: 61 ?Referring MD:  ?Date of Birth: [birth year] ?Gender: Male ?Account #: [id number] ?Procedure:                Colonoscopy ?Indications:              Screening for colorectal malignant neoplasm,  ?                          Incidental - Iron deficiency anemia ?Medicines:                Monitored Anesthesia Care ?Procedure:                Pre-Anesthesia Assessment: ?                          - Prior to the procedure, a History and Physical  ?                          was performed, and patient medications and  ?                          allergies were reviewed. The patient's tolerance of  ?                          previous anesthesia was also reviewed. The risks  ?                          and benefits of the procedure and the sedation  ?                          options and risks were discussed with the patient.  ?                          All questions were answered, and informed consent  ?                          was obtained. Prior Anticoagulants: The patient has  ?                          taken no previous anticoagulant or antiplatelet  ?                          agents. ASA Grade Assessment: III - A patient with  ?                          severe systemic disease. After reviewing the risks  ?                          and benefits, the patient was deemed in  ?                          satisfactory condition to undergo the procedure. ?  After obtaining informed consent, the colonoscope  ?                          was passed under direct vision. Throughout the  ?                          procedure, the patient's blood pressure, pulse, and  ?                          oxygen saturations were monitored continuously. The  ?                          Colonoscope was introduced through the anus and  ?                          advanced to the 5 cm into  the ileum. The  ?                          colonoscopy was performed without difficulty. The  ?                          patient tolerated the procedure. The quality of the  ?                          bowel preparation was good. The ileocecal valve,  ?                          appendiceal orifice, and rectum were photographed. ?Scope In: 2:28:58 PM ?Scope Out: 2:42:27 PM ?Scope Withdrawal Time: 0 hours 10 minutes 8 seconds  ?Total Procedure Duration: 0 hours 13 minutes 29 seconds  ?Findings:                 The digital rectal exam findings include  ?                          hemorrhoids. Pertinent negatives include no  ?                          palpable rectal lesions. ?                          Normal mucosa was found in the entire colon. ?                          Non-bleeding non-thrombosed external and internal  ?                          hemorrhoids were found during retroflexion, during  ?                          perianal exam and during digital exam. The  ?                          hemorrhoids were Grade II (internal hemorrhoids  ?  that prolapse but reduce spontaneously). ?Complications:            No immediate complications. ?Estimated Blood Loss:     Estimated blood loss was minimal. ?Impression:               - Hemorrhoids found on digital rectal exam. ?                          - Normal mucosa in the entire examined colon. ?                          - Non-bleeding non-thrombosed external and internal  ?                          hemorrhoids. ?Recommendation:           - The patient will be observed post-procedure,  ?                          until all discharge criteria are met. ?                          - Discharge patient to home. ?                          - Patient has a contact number available for  ?                          emergencies. The signs and symptoms of potential  ?                          delayed complications were discussed with the  ?                           patient. Return to normal activities tomorrow.  ?                          Written discharge instructions were provided to the  ?                          patient. ?                          - High fiber diet. ?                          - Use FiberCon 1-2 tablets PO daily. ?                          - Repeat colonoscopy in 10 years for screening  ?                          purposes. ?                          - Consider VCE for further evaluation of IDA, if  ?  EGD is pursued and APC is performed of AVMs and  ?                          things persist. Or could consider VCE upfront  ?                          before EGD, to consider role of SBE. ?                          - The findings and recommendations were discussed  ?                          with the patient. ?Justice Britain, MD ?09/01/2021 2:53:11 PM ?

## 2021-09-06 ENCOUNTER — Telehealth: Payer: Self-pay

## 2021-09-06 NOTE — Telephone Encounter (Signed)
?  Follow up Call- ? ? ?  09/01/2021  ? 12:34 PM  ?Call back number  ?Post procedure Call Back phone  # [phone]  ?Permission to leave phone message Yes  ?  ? ?Patient questions: ? ?Do you have a fever, pain , or abdominal swelling? No. ?Pain Score  0 * ? ?Have you tolerated food without any problems? Yes.   ? ?Have you been able to return to your normal activities? Yes.   ? ?Do you have any questions about your discharge instructions: ?Diet   No. ?Medications  No. ?Follow up visit  No. ? ?Do you have questions or concerns about your Care? No. ? ?Actions: ?* If pain score is 4 or above: ?No action needed, pain <4. ? ? ?

## 2021-09-09 ENCOUNTER — Encounter: Payer: Self-pay | Admitting: Gastroenterology

## 2021-09-19 ENCOUNTER — Other Ambulatory Visit: Payer: Self-pay

## 2021-09-19 DIAGNOSIS — D509 Iron deficiency anemia, unspecified: Secondary | ICD-10-CM

## 2021-09-19 NOTE — Progress Notes (Deleted)
[facility] Phone: [phone] Subjective:    I'm seeing this patient by the request  of:  [name], MD  CC:   [id number]  10/14/2020 Patient given injection today.  Tolerated the procedure well.  We discussed with patient about the possibility of viscosupplementation consider the patient insurance he is unable to afford it.  Patient is a diabetic and will be watching his blood sugars.  Patient has had difficulty he stated last week where he was having blood sugars in the 300s but now back to the 200s.  Patient does have a follow-up with his endocrinologist in the near future.  Patient was told though if any increase in blood sugars to seek medical attention immediately.  Discussed staying well-hydrated as well.  Athletic trainer was in the room.  Patient was understanding.  We discussed otherwise with the potential for the meniscal tear I do feel that advanced imaging with an MRI would be beneficial.  Would need to consider those secondary to patient also not having insurance coverage and patient would like to avoid surgical intervention.  Patient will be following up with me again in 4 to 6 weeks  Update 09/20/2021 Geran Haithcock is a 61 y.o. male coming in with complaint of R knee pain. Patient states       Past Medical History:  Diagnosis Date   Chicken pox    Diabetes mellitus without complication ([place])    Essential hypertension    GERD (gastroesophageal reflux disease)    Gouty arthritis    Hypertension    Iron deficiency anemia    Vitamin D deficiency    Past Surgical History:  Procedure Laterality Date   INGUINAL HERNIA REPAIR Right 12/19/2017   Procedure: HERNIA REPAIR INGUINAL RIGHT;  Surgeon: Coralie Keens, MD;  Location: [facility];  Service: General;  Laterality: Right;   INSERTION OF MESH Right 12/19/2017   Procedure: INSERTION OF MESH;  Surgeon: Coralie Keens, MD;  Location: [facility];   Service: General;  Laterality: Right;   MASS EXCISION Right 12/19/2017   Procedure: EXCISION OF RIGHT SCROTAL MASS;  Surgeon: Coralie Keens, MD;  Location: [facility];  Service: General;  Laterality: Right;   TONSILLECTOMY     Social History   Socioeconomic History   Marital status: Divorced    Spouse name: Not on file   Number of children: 1   Years of education: 12   Highest education level: Not on file  Occupational History   Occupation: retired  Tobacco Use   Smoking status: Former    Packs/day: 2.00    Years: 25.00    Pack years: 50.00    Types: Cigarettes   Smokeless tobacco: Former    Types: Nurse, children's Use: Never used  Substance and Sexual Activity   Alcohol use: Not Currently   Drug use: No   Sexual activity: Yes    Birth control/protection: Condom  Other Topics Concern   Not on file  Social History Narrative   Fun: Just working   Investment banker, operational of Radio broadcast assistant Strain: Not on file  Food Insecurity: Not on file  Transportation Needs: Not on file  Physical Activity: Not on file  Stress: Not on file  Social Connections: Not on file   No Known Allergies Family History  Problem Relation Age of Onset   Heart disease Mother    Hypertension Mother    Diabetes  Mother    Stroke Father    Hypertension Father    Stroke Brother     Current Outpatient Medications (Endocrine & Metabolic):    glipiZIDE (GLUCOTROL XL) 10 MG 24 hr tablet, TAKE 1 TABLET BY MOUTH TWICE A DAY   insulin NPH Human (NOVOLIN N RELION) 100 UNIT/ML injection, Inject 1.1 mLs (110 Units total) into the skin every morning. And syringes 1/day   metFORMIN (GLUCOPHAGE) 1000 MG tablet, TAKE 1 TABLET BY MOUTH TWICE A DAY WITH FOOD  Current Outpatient Medications (Cardiovascular):    hydrochlorothiazide (HYDRODIURIL) 25 MG tablet, Take 1 tablet (25 mg total) by mouth daily.   lisinopril (ZESTRIL) 20 MG tablet, TAKE 1 TABLET BY MOUTH EVERY DAY   lovastatin (MEVACOR)  40 MG tablet, TAKE 1 TABLET BY MOUTH AT BEDTIME **ANNUAL APPT DUE IN JUNE,MUST SEE PROVIDER FOR REFILLS*   Current Outpatient Medications (Analgesics):    allopurinol (ZYLOPRIM) 100 MG tablet, TAKE 1 TABLET BY MOUTH TWICE A DAY   indomethacin (INDOCIN) 25 MG capsule, TAKE 1 CAPSULE BY MOUTH TWICE A DAY AS NEEDED   meloxicam (MOBIC) 15 MG tablet, Take 1 tablet (15 mg total) by mouth daily as needed for pain. (Patient not taking: Reported on 09/01/2021)   traMADol (ULTRAM) 50 MG tablet, TAKE 1 TABLET BY MOUTH EVERY 6 HOURS AS NEEDED (Patient not taking: Reported on 09/01/2021)  Current Outpatient Medications (Hematological):    ferrous sulfate 325 (65 FE) MG EC tablet, Take 1 tablet (325 mg total) by mouth 2 (two) times daily.  Current Outpatient Medications (Other):    cyclobenzaprine (FLEXERIL) 5 MG tablet, TAKE 1 TABLET BY MOUTH THREE TIMES A DAY AS NEEDED FOR MUSCLE SPASMS   diclofenac Sodium (VOLTAREN) 1 % GEL, Apply 2 g topically 4 (four) times daily. (Patient not taking: Reported on 09/01/2021)   docusate sodium (COLACE) 100 MG capsule, Take 2 capsules (200 mg total) by mouth daily as needed for mild constipation.   gabapentin (NEURONTIN) 100 MG capsule, Take 2 capsules (200 mg total) by mouth at bedtime.   pantoprazole (PROTONIX) 40 MG tablet, TAKE 1 TABLET BY MOUTH EVERY DAY   Reviewed prior external information including notes and imaging from  primary care provider As well as notes that were available from care everywhere and other healthcare systems.  Past medical history, social, surgical and family history all reviewed in electronic medical record.  No pertanent information unless stated regarding to the chief complaint.   Review of Systems:  No headache, visual changes, nausea, vomiting, diarrhea, constipation, dizziness, abdominal pain, skin rash, fevers, chills, night sweats, weight loss, swollen lymph nodes, body aches, joint swelling, chest pain, shortness of breath, mood  changes. POSITIVE muscle aches  Objective  There were no vitals taken for this visit.   General: No apparent distress alert and oriented x3 mood and affect normal, dressed appropriately.  HEENT: Pupils equal, extraocular movements intact  Respiratory: Patient's speak in full sentences and does not appear short of breath  Cardiovascular: No lower extremity edema, non tender, no erythema  Gait normal with good balance and coordination.  MSK:  Non tender with full range of motion and good stability and symmetric strength and tone of shoulders, elbows, wrist, hip, knee and ankles bilaterally.     Impression and Recommendations:     The above documentation has been reviewed and is accurate and complete Jacqualin Combes

## 2021-09-20 ENCOUNTER — Ambulatory Visit: Payer: 59 | Admitting: Family Medicine

## 2021-09-20 ENCOUNTER — Other Ambulatory Visit: Payer: Self-pay | Admitting: Internal Medicine

## 2021-09-21 ENCOUNTER — Ambulatory Visit (INDEPENDENT_AMBULATORY_CARE_PROVIDER_SITE_OTHER): Payer: 59 | Admitting: Internal Medicine

## 2021-09-21 ENCOUNTER — Encounter: Payer: Self-pay | Admitting: *Deleted

## 2021-09-21 ENCOUNTER — Encounter: Payer: Self-pay | Admitting: Internal Medicine

## 2021-09-21 VITALS — BP 122/62 | HR 62 | Temp 98.1°F | Ht 67.0 in | Wt 247.0 lb

## 2021-09-21 DIAGNOSIS — Z0001 Encounter for general adult medical examination with abnormal findings: Secondary | ICD-10-CM

## 2021-09-21 DIAGNOSIS — E559 Vitamin D deficiency, unspecified: Secondary | ICD-10-CM

## 2021-09-21 DIAGNOSIS — E1142 Type 2 diabetes mellitus with diabetic polyneuropathy: Secondary | ICD-10-CM

## 2021-09-21 DIAGNOSIS — I10 Essential (primary) hypertension: Secondary | ICD-10-CM | POA: Diagnosis not present

## 2021-09-21 DIAGNOSIS — E538 Deficiency of other specified B group vitamins: Secondary | ICD-10-CM

## 2021-09-21 DIAGNOSIS — D509 Iron deficiency anemia, unspecified: Secondary | ICD-10-CM

## 2021-09-21 LAB — CBC WITH DIFFERENTIAL/PLATELET
Basophils Absolute: 0 10*3/uL (ref 0.0–0.1)
Basophils Relative: 0.6 % (ref 0.0–3.0)
Eosinophils Absolute: 0.2 10*3/uL (ref 0.0–0.7)
Eosinophils Relative: 3.2 % (ref 0.0–5.0)
HCT: 34.9 % — ABNORMAL LOW (ref 39.0–52.0)
Hemoglobin: 11.1 g/dL — ABNORMAL LOW (ref 13.0–17.0)
Lymphocytes Relative: 38.1 % (ref 12.0–46.0)
Lymphs Abs: 2.3 10*3/uL (ref 0.7–4.0)
MCHC: 31.6 g/dL (ref 30.0–36.0)
MCV: 79.5 fl (ref 78.0–100.0)
Monocytes Absolute: 0.4 10*3/uL (ref 0.1–1.0)
Monocytes Relative: 7.2 % (ref 3.0–12.0)
Neutro Abs: 3.1 10*3/uL (ref 1.4–7.7)
Neutrophils Relative %: 50.9 % (ref 43.0–77.0)
Platelets: 339 10*3/uL (ref 150.0–400.0)
RBC: 4.39 Mil/uL (ref 4.22–5.81)
RDW: 17.6 % — ABNORMAL HIGH (ref 11.5–15.5)
WBC: 6.1 10*3/uL (ref 4.0–10.5)

## 2021-09-21 LAB — URINALYSIS, ROUTINE W REFLEX MICROSCOPIC
Bilirubin Urine: NEGATIVE
Hgb urine dipstick: NEGATIVE
Ketones, ur: NEGATIVE
Leukocytes,Ua: NEGATIVE
Nitrite: NEGATIVE
RBC / HPF: NONE SEEN (ref 0–?)
Specific Gravity, Urine: 1.01 (ref 1.000–1.030)
Total Protein, Urine: NEGATIVE
Urine Glucose: NEGATIVE
Urobilinogen, UA: 0.2 (ref 0.0–1.0)
pH: 6 (ref 5.0–8.0)

## 2021-09-21 LAB — LIPID PANEL
Cholesterol: 123 mg/dL (ref 0–200)
HDL: 32.7 mg/dL — ABNORMAL LOW (ref >39.00)
LDL Cholesterol: 53 mg/dL (ref 0–99)
NonHDL: 90.43
Total CHOL/HDL Ratio: 4
Triglycerides: 188 mg/dL — ABNORMAL HIGH (ref 0.0–149.0)
VLDL: 37.6 mg/dL (ref 0.0–40.0)

## 2021-09-21 LAB — MICROALBUMIN / CREATININE URINE RATIO
Creatinine,U: 66.1 mg/dL
Microalb Creat Ratio: 1.1 mg/g (ref 0.0–30.0)
Microalb, Ur: <0.7 mg/dL (ref 0.0–1.9)

## 2021-09-21 LAB — BASIC METABOLIC PANEL
BUN: 14 mg/dL (ref 6–23)
CO2: 26 mEq/L (ref 19–32)
Calcium: 9.1 mg/dL (ref 8.4–10.5)
Chloride: 100 mEq/L (ref 96–112)
Creatinine, Ser: 1.29 mg/dL (ref 0.40–1.50)
GFR: 59.13 mL/min — ABNORMAL LOW (ref >60.00)
Glucose, Bld: 218 mg/dL — ABNORMAL HIGH (ref 70–99)
Potassium: 4.5 mEq/L (ref 3.5–5.1)
Sodium: 134 mEq/L — ABNORMAL LOW (ref 135–145)

## 2021-09-21 LAB — HEPATIC FUNCTION PANEL
ALT: 31 U/L (ref 0–53)
AST: 18 U/L (ref 0–37)
Albumin: 4.1 g/dL (ref 3.5–5.2)
Alkaline Phosphatase: 72 U/L (ref 39–117)
Bilirubin, Direct: 0.1 mg/dL (ref 0.0–0.3)
Total Bilirubin: 0.3 mg/dL (ref 0.2–1.2)
Total Protein: 6.6 g/dL (ref 6.0–8.3)

## 2021-09-21 LAB — PSA: PSA: 0.4 ng/mL (ref 0.10–4.00)

## 2021-09-21 LAB — TSH: TSH: 2.03 u[IU]/mL (ref 0.35–5.50)

## 2021-09-21 LAB — VITAMIN D 25 HYDROXY (VIT D DEFICIENCY, FRACTURES): VITD: 19.45 ng/mL — ABNORMAL LOW (ref 30.00–100.00)

## 2021-09-21 LAB — VITAMIN B12: Vitamin B-12: 413 pg/mL (ref 211–911)

## 2021-09-21 NOTE — Progress Notes (Signed)
Patient ID: Steven [name], male   DOB: 1960/06/25, 61 y.o.   MRN: [id number] ? ? ? ?     Chief Complaint:: wellness exam and dm, iron deficiency anemia, htn, low vit d ? ?     HPI:  Steven [name] is a 61 y.o. male here for wellness exam; due for optho exam, declines shingrix, tdap, o/w up to date ?         ?              Also no overt bleeding or GI blood loss.  Pt denies chest pain, increased sob or doe, wheezing, orthopnea, PND, increased LE swelling, palpitations, dizziness or syncope.   Pt denies polydipsia, polyuria, or new focal neuro s/s.    Pt denies fever, wt loss, night sweats, loss of appetite, or other constitutional symptoms  No other new complaints   ?  ?Wt Readings from Last 3 Encounters:  ?09/21/21 247 lb (112 kg)  ?09/01/21 247 lb (112 kg)  ?08/24/21 248 lb 6.4 oz (112.7 kg)  ? ?BP Readings from Last 3 Encounters:  ?09/21/21 122/62  ?09/01/21 126/77  ?08/24/21 (!) 158/80  ? ?Immunization History  ?Administered Date(s) Administered  ? Influenza,inj,Quad PF,6+ Mos 06/11/2017, 03/15/2018, 05/18/2020, 03/23/2021  ? Pneumococcal Polysaccharide-23 07/02/2015, 03/23/2021  ?There are no preventive care reminders to display for this patient. ?  ? ?Past Medical History:  ?Diagnosis Date  ? Chicken pox   ? Diabetes mellitus without complication ([place])   ? Essential hypertension   ? GERD (gastroesophageal reflux disease)   ? Gouty arthritis   ? Hypertension   ? Iron deficiency anemia   ? Vitamin D deficiency   ? ?Past Surgical History:  ?Procedure Laterality Date  ? INGUINAL HERNIA REPAIR Right 12/19/2017  ? Procedure: HERNIA REPAIR INGUINAL RIGHT;  Surgeon: Coralie Keens, MD;  Location: [facility];  Service: General;  Laterality: Right;  ? INSERTION OF MESH Right 12/19/2017  ? Procedure: INSERTION OF MESH;  Surgeon: Coralie Keens, MD;  Location: [facility];  Service: General;  Laterality: Right;  ? MASS EXCISION Right 12/19/2017  ? Procedure: EXCISION OF RIGHT SCROTAL MASS;  Surgeon: Coralie Keens, MD;   Location: [facility];  Service: General;  Laterality: Right;  ? TONSILLECTOMY    ? ? reports that he has quit smoking. His smoking use included cigarettes. He has a 50.00 pack-year smoking history. He has quit using smokeless tobacco.  His smokeless tobacco use included chew. He reports that he does not currently use alcohol. He reports that he does not use drugs. ?family history includes Diabetes in his mother; Heart disease in his mother; Hypertension in his father and mother; Stroke in his brother and father. ?No Known Allergies ?Current Outpatient Medications on File Prior to Visit  ?Medication Sig Dispense Refill  ? allopurinol (ZYLOPRIM) 100 MG tablet TAKE 1 TABLET BY MOUTH TWICE A DAY 180 tablet 1  ? cyclobenzaprine (FLEXERIL) 5 MG tablet TAKE 1 TABLET BY MOUTH THREE TIMES A DAY AS NEEDED FOR MUSCLE SPASMS 30 tablet 5  ? diclofenac Sodium (VOLTAREN) 1 % GEL Apply 2 g topically 4 (four) times daily. 50 g 0  ? ferrous sulfate 325 (65 FE) MG EC tablet Take 1 tablet (325 mg total) by mouth 2 (two) times daily. 60 tablet 0  ? gabapentin (NEURONTIN) 100 MG capsule Take 2 capsules (200 mg total) by mouth at bedtime. 180 capsule 3  ? glipiZIDE (GLUCOTROL XL) 10 MG 24 hr tablet TAKE 1 TABLET BY MOUTH  TWICE A DAY 180 tablet 0  ? hydrochlorothiazide (HYDRODIURIL) 25 MG tablet Take 1 tablet (25 mg total) by mouth daily. 90 tablet 3  ? indomethacin (INDOCIN) 25 MG capsule TAKE 1 CAPSULE BY MOUTH TWICE A DAY AS NEEDED 180 capsule 1  ? insulin NPH Human (NOVOLIN N RELION) 100 UNIT/ML injection Inject 1.1 mLs (110 Units total) into the skin every morning. And syringes 1/day 110 mL 3  ? lisinopril (ZESTRIL) 20 MG tablet TAKE 1 TABLET BY MOUTH EVERY DAY 90 tablet 3  ? lovastatin (MEVACOR) 40 MG tablet TAKE 1 TABLET BY MOUTH AT BEDTIME **ANNUAL APPT DUE IN JUNE,MUST SEE PROVIDER FOR REFILLS* 90 tablet 3  ? metFORMIN (GLUCOPHAGE) 1000 MG tablet TAKE 1 TABLET BY MOUTH TWICE A DAY WITH FOOD 180 tablet 3  ? pantoprazole (PROTONIX) 40  MG tablet TAKE 1 TABLET BY MOUTH EVERY DAY 90 tablet 3  ? traMADol (ULTRAM) 50 MG tablet TAKE 1 TABLET BY MOUTH EVERY 6 HOURS AS NEEDED 60 tablet 2  ? docusate sodium (COLACE) 100 MG capsule Take 2 capsules (200 mg total) by mouth daily as needed for mild constipation. (Patient not taking: Reported on 09/21/2021) 10 capsule 0  ? meloxicam (MOBIC) 15 MG tablet Take 1 tablet (15 mg total) by mouth daily as needed for pain. (Patient not taking: Reported on 09/01/2021) 30 tablet 11  ? ?No current facility-administered medications on file prior to visit.  ? ?     ROS:  All others reviewed and negative. ? ?Objective  ? ?     PE:  BP 122/62 (BP Location: Right Arm, Patient Position: Sitting, Cuff Size: Large)   Pulse 62   Temp 98.1 ?F (36.7 ?C) (Oral)   Ht '5\' 7"'$  (1.702 m)   Wt 247 lb (112 kg)   SpO2 96%   BMI 38.69 kg/m?  ? ?              Constitutional: Pt appears in NAD ?              HENT: Head: NCAT.  ?              Right Ear: External ear normal.   ?              Left Ear: External ear normal.  ?              Eyes: . Pupils are equal, round, and reactive to light. Conjunctivae and EOM are normal ?              Nose: without d/c or deformity ?              Neck: Neck supple. Gross normal ROM ?              Cardiovascular: Normal rate and regular rhythm.   ?              Pulmonary/Chest: Effort normal and breath sounds without rales or wheezing.  ?              Abd:  Soft, NT, ND, + BS, no organomegaly ?              Neurological: Pt is alert. At baseline orientation, motor grossly intact ?              Skin: Skin is warm. No rashes, no other new lesions, LE edema - none ?  Psychiatric: Pt behavior is normal without agitation  ? ?Micro: none ? ?Cardiac tracings I have personally interpreted today:  none ? ?Pertinent Radiological findings (summarize): none  ? ?Lab Results  ?Component Value Date  ? WBC 6.1 09/21/2021  ? HGB 11.1 (L) 09/21/2021  ? HCT 34.9 (L) 09/21/2021  ? PLT 339.0 09/21/2021  ? GLUCOSE  218 (H) 09/21/2021  ? CHOL 123 09/21/2021  ? TRIG 188.0 (H) 09/21/2021  ? HDL 32.70 (L) 09/21/2021  ? [place] 53 09/21/2021  ? ALT 31 09/21/2021  ? AST 18 09/21/2021  ? NA 134 (L) 09/21/2021  ? K 4.5 09/21/2021  ? CL 100 09/21/2021  ? CREATININE 1.29 09/21/2021  ? BUN 14 09/21/2021  ? CO2 26 09/21/2021  ? TSH 2.03 09/21/2021  ? PSA 0.40 09/21/2021  ? HGBA1C 8.5 (A) 08/24/2021  ? MICROALBUR <0.7 09/21/2021  ? ?Assessment/Plan:  ?Steven [name] is a 61 y.o. Black or African American [2] male with  has a past medical history of Chicken pox, Diabetes mellitus without complication ([place]), Essential hypertension, GERD (gastroesophageal reflux disease), Gouty arthritis, Hypertension, Iron deficiency anemia, and Vitamin D deficiency. ? ?Vitamin D deficiency ?Last vitamin D ?Lab Results  ?Component Value Date  ? VD25OH 27.90 (L) 03/23/2021  ? ?Low, to start oral replacement ? ? ?Encounter for well adult exam with abnormal findings ?Age and sex appropriate education and counseling updated with regular exercise and diet ?Referrals for preventative services - for optho referral ?Immunizations addressed - declines shingrix, dtdap ?Smoking counseling  - none needed ?Evidence for depression or other mood disorder - none significant ?Most recent labs reviewed. ?I have personally reviewed and have noted: ?1) the patient's medical and social history ?2) The patient's current medications and supplements ?3) The patient's height, weight, and BMI have been recorded in the chart ? ? ?Essential hypertension ?BP Readings from Last 3 Encounters:  ?09/21/21 122/62  ?09/01/21 126/77  ?08/24/21 (!) 158/80  ? ?Stable, pt to continue medical treatment lisinopril, hct ? ? ?Iron deficiency anemia ?Pt for capsule study soon, recent iron normal ? ?Diabetes ([place]) ?Lab Results  ?Component Value Date  ? HGBA1C 8.5 (A) 08/24/2021  ? ?uncontrolled, pt to continue current medical treatment metformin, nph, glipizide and f/u endo as he prefers, also refer  optho for yearly exam ? ?Followup: Return in about 6 months (around 03/23/2022). ? ?Cathlean Cower, MD 09/25/2021 6:23 PM ?[place] ?[place] ?Internal Medicine ?

## 2021-09-21 NOTE — Patient Instructions (Addendum)
Please continue all other medications as before, and refills have been done if requested.  Please have the pharmacy call with any other refills you may need.  Please continue your efforts at being more active, low cholesterol diet, and weight control.  You are otherwise up to date with prevention measures today.  Please keep your appointments with your specialists as you may have planned - capsule test soon  You will be contacted regarding the referral for: eye doctor  Please go to the LAB at the blood drawing area for the tests to be done  You will be contacted by phone if any changes need to be made immediately.  Otherwise, you will receive a letter about your results with an explanation, but please check with MyChart first.  Please remember to sign up for MyChart if you have not done so, as this will be important to you in the future with finding out test results, communicating by private email, and scheduling acute appointments online when needed.  Please make an Appointment to return in 6 months, or sooner if needed

## 2021-09-21 NOTE — Assessment & Plan Note (Signed)
Last vitamin D ?Lab Results  ?Component Value Date  ? VD25OH 27.90 (L) 03/23/2021  ? ?Low, to start oral replacement ? ?

## 2021-09-25 ENCOUNTER — Encounter: Payer: Self-pay | Admitting: Internal Medicine

## 2021-09-25 NOTE — Assessment & Plan Note (Signed)
BP Readings from Last 3 Encounters:  ?09/21/21 122/62  ?09/01/21 126/77  ?08/24/21 (!) 158/80  ? ?Stable, pt to continue medical treatment lisinopril, hct ? ?

## 2021-09-25 NOTE — Assessment & Plan Note (Signed)
Lab Results  ?Component Value Date  ? HGBA1C 8.5 (A) 08/24/2021  ? ?uncontrolled, pt to continue current medical treatment metformin, nph, glipizide and f/u endo as he prefers, also refer optho for yearly exam ? ?

## 2021-09-25 NOTE — Assessment & Plan Note (Signed)
Age and sex appropriate education and counseling updated with regular exercise and diet ?Referrals for preventative services - for optho referral ?Immunizations addressed - declines shingrix, dtdap ?Smoking counseling  - none needed ?Evidence for depression or other mood disorder - none significant ?Most recent labs reviewed. ?I have personally reviewed and have noted: ?1) the patient's medical and social history ?2) The patient's current medications and supplements ?3) The patient's height, weight, and BMI have been recorded in the chart ? ?

## 2021-09-25 NOTE — Assessment & Plan Note (Signed)
Pt for capsule study soon, recent iron normal ?

## 2021-10-03 ENCOUNTER — Encounter: Payer: Self-pay | Admitting: Gastroenterology

## 2021-10-03 ENCOUNTER — Ambulatory Visit (INDEPENDENT_AMBULATORY_CARE_PROVIDER_SITE_OTHER): Payer: 59 | Admitting: Gastroenterology

## 2021-10-03 DIAGNOSIS — K31819 Angiodysplasia of stomach and duodenum without bleeding: Secondary | ICD-10-CM

## 2021-10-03 DIAGNOSIS — D500 Iron deficiency anemia secondary to blood loss (chronic): Secondary | ICD-10-CM

## 2021-10-03 NOTE — Progress Notes (Signed)
SN: [id number] ?Exp: 02-08-2023 ?LOT: [id number] ?Patient arrived for Capsule Endoscopy. Reported the prep went well. This nurse explained dietary restrictions for the next few hours. Patient verbalized understanding. Opened capsule, ensured capsule was flashing prior to the patient swallowing the capsule. Patient swallowed capsule without difficulty. Patient instructed to return to the office at 4:00 pm today for removal of the recording equipment, to call the office with any questions and if no capsule was visualized after 72 hours. No further questions by the conclusion of the visit.  ?

## 2021-10-03 NOTE — Patient Instructions (Signed)

## 2021-10-03 NOTE — Progress Notes (Signed)
? ?I, [name], LAT, ATC, am serving as scribe for Dr. Lynne Leader. ? ?Steven [name] is a 61 y.o. male who presents to [place] at [facility] today for f/u of chronic R knee pain.  He was last seen by Dr. Glennon Mac on 03/23/21 for R knee and R foot pain and multiple times prior to that by Dr. Tamala Julian for R knee pain.  His last R knee steroid injection was on 03/23/21.  Today, pt reports that his R knee pain has been flared up for about a month although it feels "pretty good today."  He states that the last knee injection helped until about a month ago. ? ?He works in Architect. ? ?Diagnostic testing: R knee XR- 08/31/20 ? ?Pertinent review of systems: No fevers or chills ? ?Relevant historical information: Hypertension.  Diabetes. ? ? ?Exam:  ?BP 106/62 (BP Location: Right Arm, Patient Position: Sitting, Cuff Size: Large)   Pulse (!) 58   Ht '5\' 7"'$  (1.702 m)   Wt 247 lb (112 kg)   SpO2 94%   BMI 38.69 kg/m?  ?General: Well Developed, well nourished, and in no acute distress.  ? ?MSK: Right knee: Mild joint effusion. ?Normal motion with crepitation.  Tender palpation medial joint line. ?Stable ligamentous exam. ? ? ? ?Lab and Radiology Results ? ?Procedure: Real-time Ultrasound Guided Injection of right knee superior lateral patellar space ?Device: Philips Affiniti 50G ?Images permanently stored and available for review in PACS ?Verbal informed consent obtained.  Discussed risks and benefits of procedure. Warned about infection, bleeding, hyperglycemia damage to structures among others. ?Patient expresses understanding and agreement ?Time-out conducted.   ?Noted no overlying erythema, induration, or other signs of local infection.   ?Skin prepped in a sterile fashion.   ?Local anesthesia: Topical Ethyl chloride.   ?With sterile technique and under real time ultrasound guidance: 40 mg of Kenalog and 2 mL of Marcaine injected into knee joint. Fluid seen entering the joint capsule.    ?Completed without difficulty   ?Pain immediately resolved suggesting accurate placement of the medication.   ?Advised to call if fevers/chills, erythema, induration, drainage, or persistent bleeding.   ?Images permanently stored and available for review in the ultrasound unit.  ?Impression: Technically successful ultrasound guided injection. ? ? ?EXAM: ?RIGHT KNEE - COMPLETE 4+ VIEW ?  ?COMPARISON:  None. ?  ?FINDINGS: ?There are moderate tricompartmental degenerative changes of the ?knee, greatest within the medial and patellofemoral compartments. ?There is no large joint effusion. There is no acute displaced ?fracture. There is a metallic foreign body projecting over the ?medial tibial plateau, likely related to prior surgical ?intervention. ?  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 No acute ?displaced fracture or large joint effusion. ?  ?  ?Electronically Signed ?  By: Constance Holster M.D. ?  On: 08/31/2020 19:23 ?I, Lynne Leader, personally (independently) visualized and performed the interpretation of the images attached in this note. ? ? ? ? ? ? ?Assessment and Plan: ?61 y.o. male with right knee pain due to exacerbation of DJD.  Plan for repeat steroid injection today.  His last injection lasted about 6 months.  Hopefully he will get some more benefit out of today's injection.  However if it does not work well enough we can proceed to gel injections and ultimately knee replacement.  Recheck back as needed. ? ? ?PDMP not reviewed this encounter. ?Orders Placed This Encounter  ?Procedures  ? [place] LIMITED JOINT SPACE STRUCTURES LOW RIGHT(NO LINKED  CHARGES)  ?  Order Specific Question:   Reason for Exam (SYMPTOM  OR DIAGNOSIS REQUIRED)  ?  Answer:   R knee pain  ?  Order Specific Question:   Preferred imaging location?  ?  Answer:   [facility]  ? ?No orders of the defined types were placed in this encounter. ? ? ? ?Discussed warning signs or symptoms.  Please see discharge instructions. Patient expresses understanding. ? ? ?The above documentation has been reviewed and is accurate and complete Lynne Leader, M.D. ? ? ?

## 2021-10-04 ENCOUNTER — Ambulatory Visit: Payer: Self-pay

## 2021-10-04 ENCOUNTER — Encounter: Payer: Self-pay | Admitting: Family Medicine

## 2021-10-04 ENCOUNTER — Ambulatory Visit (INDEPENDENT_AMBULATORY_CARE_PROVIDER_SITE_OTHER): Payer: 59 | Admitting: Family Medicine

## 2021-10-04 VITALS — BP 106/62 | HR 58 | Ht 67.0 in | Wt 247.0 lb

## 2021-10-04 DIAGNOSIS — M1711 Unilateral primary osteoarthritis, right knee: Secondary | ICD-10-CM | POA: Diagnosis not present

## 2021-10-04 DIAGNOSIS — G8929 Other chronic pain: Secondary | ICD-10-CM

## 2021-10-04 DIAGNOSIS — M25561 Pain in right knee: Secondary | ICD-10-CM

## 2021-10-04 NOTE — Patient Instructions (Addendum)
Nice to see you today. ? ?You had a R knee injection.  Call or go to the ER if you develop a large red swollen joint with extreme pain or oozing puss.  ? ?  ?I recommend you obtained a compression sleeve to help with your joint problems. There are many options on the market however I recommend obtaining a knee Body Helix compression sleeve.  You can find information (including how to appropriate measure yourself for sizing) can be found at www.Body http://www.lambert.com/.  Many of these products are health savings account (HSA) eligible.  ? ?You can use the compression sleeve at any time throughout the day but is most important to use while being active as well as for 2 hours post-activity.   It is appropriate to ice following activity with the compression sleeve in place.  ? ?Please use Voltaren gel (Generic Diclofenac Gel) up to 4x daily for pain as needed.  This is available over-the-counter as both the name brand Voltaren gel and the generic diclofenac gel.  ? ?Follow-up: as needed ?

## 2021-10-05 ENCOUNTER — Ambulatory Visit: Payer: Self-pay | Admitting: Internal Medicine

## 2021-10-06 ENCOUNTER — Telehealth: Payer: Self-pay | Admitting: Gastroenterology

## 2021-10-06 NOTE — Telephone Encounter (Signed)
The video capsule study did not show any additional sources of blood loss in the small bowel. ? 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 ? ?He needs an upper endoscopy with me in the [facility] endoscopy lab to cauterize those lesions. ?Please check, but I believe my next available date is in July.  We may call him if we get a cancellation sooner. ? ?HD ?

## 2021-10-13 ENCOUNTER — Other Ambulatory Visit: Payer: Self-pay | Admitting: Internal Medicine

## 2021-10-13 DIAGNOSIS — M109 Gout, unspecified: Secondary | ICD-10-CM

## 2021-10-13 DIAGNOSIS — M25471 Effusion, right ankle: Secondary | ICD-10-CM

## 2021-10-18 NOTE — Telephone Encounter (Signed)
Pt has an office visit scheduled for 10/20/21 to discuss VCE results and next steps. Dr. Loletha Carrow would like pt to keep appt.

## 2021-10-20 ENCOUNTER — Encounter: Payer: Self-pay | Admitting: Gastroenterology

## 2021-10-20 ENCOUNTER — Ambulatory Visit: Payer: 59 | Admitting: Gastroenterology

## 2021-10-20 ENCOUNTER — Other Ambulatory Visit (INDEPENDENT_AMBULATORY_CARE_PROVIDER_SITE_OTHER): Payer: 59

## 2021-10-20 VITALS — BP 128/70 | HR 80 | Ht 67.0 in | Wt 240.0 lb

## 2021-10-20 DIAGNOSIS — K31819 Angiodysplasia of stomach and duodenum without bleeding: Secondary | ICD-10-CM

## 2021-10-20 DIAGNOSIS — D509 Iron deficiency anemia, unspecified: Secondary | ICD-10-CM | POA: Diagnosis not present

## 2021-10-20 LAB — CBC WITH DIFFERENTIAL/PLATELET
Basophils Absolute: 0 10*3/uL (ref 0.0–0.1)
Basophils Relative: 0.4 % (ref 0.0–3.0)
Eosinophils Absolute: 0.1 10*3/uL (ref 0.0–0.7)
Eosinophils Relative: 1.9 % (ref 0.0–5.0)
HCT: 36.5 % — ABNORMAL LOW (ref 39.0–52.0)
Hemoglobin: 11.5 g/dL — ABNORMAL LOW (ref 13.0–17.0)
Lymphocytes Relative: 36.7 % (ref 12.0–46.0)
Lymphs Abs: 2.8 10*3/uL (ref 0.7–4.0)
MCHC: 31.6 g/dL (ref 30.0–36.0)
MCV: 79.6 fl (ref 78.0–100.0)
Monocytes Absolute: 0.6 10*3/uL (ref 0.1–1.0)
Monocytes Relative: 7.9 % (ref 3.0–12.0)
Neutro Abs: 4 10*3/uL (ref 1.4–7.7)
Neutrophils Relative %: 53.1 % (ref 43.0–77.0)
Platelets: 367 10*3/uL (ref 150.0–400.0)
RBC: 4.59 Mil/uL (ref 4.22–5.81)
RDW: 17.8 % — ABNORMAL HIGH (ref 11.5–15.5)
WBC: 7.6 10*3/uL (ref 4.0–10.5)

## 2021-10-20 LAB — IBC + FERRITIN
Ferritin: 107.1 ng/mL (ref 22.0–322.0)
Iron: 49 ug/dL (ref 42–165)
Saturation Ratios: 12.7 % — ABNORMAL LOW (ref 20.0–50.0)
TIBC: 386.4 ug/dL (ref 250.0–450.0)
Transferrin: 276 mg/dL (ref 212.0–360.0)

## 2021-10-20 NOTE — Patient Instructions (Addendum)
If you are age 63 or older, your body mass index should be between 23-30. Your Body mass index is 37.59 kg/m. If this is out of the aforementioned range listed, please consider follow up with your Primary Care Provider.  If you are age 34 or younger, your body mass index should be between 19-25. Your Body mass index is 37.59 kg/m. If this is out of the aformentioned range listed, please consider follow up with your Primary Care Provider.   ________________________________________________________  The [facility] providers would like to encourage you to use [facility] to communicate with providers for non-urgent requests or questions.  Due to long hold times on the telephone, sending your provider a message by [facility] may be a faster and more efficient way to get a response.  Please allow 48 business hours for a response.  Please remember that this is for non-urgent requests.  _______________________________________________________  Your provider has requested that you go to the basement level for lab work before leaving today. Press "B" on the elevator. The lab is located at the first door on the left as you exit the elevator.  You have been scheduled for an endoscopy. Please follow written instructions given to you at your visit today. If you use inhalers (even only as needed), please bring them with you on the day of your procedure.  Due to recent changes in healthcare laws, you may see the results of your imaging and laboratory studies on MyChart before your provider has had a chance to review them.  We understand that in some cases there may be results that are confusing or concerning to you. Not all laboratory results come back in the same time frame and the provider may be waiting for multiple results in order to interpret others.  Please give [place] 48 hours in order for your provider to thoroughly review all the results before contacting the office for clarification of your results.   It was a  pleasure to see you today!  Thank you for trusting me with your gastrointestinal care!

## 2021-10-20 NOTE — Progress Notes (Signed)
[facility] Progress Note  Chief Complaint: Iron deficiency anemia  Subjective  History: [name] follows up for iron deficiency anemia.  He was seen in the office this February, then underwent EGD and colonoscopy by Dr. Rush Landmark in mid April.  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  Video capsule study was recommended as well as consideration of repeat EGD in the hospital setting for APC of the AVMs. Video capsule study was reviewed and no additional source of blood loss was seen in the small bowel.  [name] says he is feeling well these days.  He describes having had intermittent black tarry stool even before starting the iron, and says he communicated with Dr. Jenny Reichmann about that thinking it may be related to one of his medicines after some reading he had done.  He was not told to stop or change any of his medicines.  It still occurs intermittently, so he is planning to bring that up again with Dr. Jenny Reichmann. He denies abdominal pain nausea or vomiting.  ROS: Cardiovascular:  no chest pain Respiratory: Chronic DOE Arthralgias Remainder of systems negative except as above The patient's Past Medical, Family and Social History were reviewed and are on file in the EMR.  Objective:  Med list reviewed  Current Outpatient Medications:    allopurinol (ZYLOPRIM) 100 MG tablet, TAKE 1 TABLET BY MOUTH TWICE A DAY, Disp: 180 tablet, Rfl: 3   cyclobenzaprine (FLEXERIL) 5 MG tablet, TAKE 1 TABLET BY MOUTH THREE TIMES A DAY AS NEEDED FOR MUSCLE SPASMS, Disp: 30 tablet, Rfl: 5   diclofenac Sodium (VOLTAREN) 1 % GEL, Apply 2 g topically 4 (four) times daily., Disp: 50 g, Rfl: 0   docusate sodium (COLACE) 100 MG capsule, Take 2 capsules (200 mg total) by mouth daily as needed for mild constipation., Disp: 10 capsule, Rfl: 0   ferrous sulfate 325 (65 FE) MG EC tablet, Take 1 tablet (325 mg total) by mouth 2 (two) times daily., Disp: 60 tablet, Rfl: 0   gabapentin  (NEURONTIN) 100 MG capsule, Take 2 capsules (200 mg total) by mouth at bedtime., Disp: 180 capsule, Rfl: 3   glipiZIDE (GLUCOTROL XL) 10 MG 24 hr tablet, TAKE 1 TABLET BY MOUTH TWICE A DAY, Disp: 180 tablet, Rfl: 0   hydrochlorothiazide (HYDRODIURIL) 25 MG tablet, Take 1 tablet (25 mg total) by mouth daily., Disp: 90 tablet, Rfl: 3   indomethacin (INDOCIN) 25 MG capsule, TAKE 1 CAPSULE BY MOUTH TWICE A DAY AS NEEDED, Disp: 180 capsule, Rfl: 1   insulin NPH Human (NOVOLIN N RELION) 100 UNIT/ML injection, Inject 1.1 mLs (110 Units total) into the skin every morning. And syringes 1/day, Disp: 110 mL, Rfl: 3   lisinopril (ZESTRIL) 20 MG tablet, TAKE 1 TABLET BY MOUTH EVERY DAY, Disp: 90 tablet, Rfl: 3   lovastatin (MEVACOR) 40 MG tablet, TAKE 1 TABLET BY MOUTH AT BEDTIME **ANNUAL APPT DUE IN JUNE,MUST SEE PROVIDER FOR REFILLS*, Disp: 90 tablet, Rfl: 3   meloxicam (MOBIC) 15 MG tablet, Take 1 tablet (15 mg total) by mouth daily as needed for pain., Disp: 30 tablet, Rfl: 11   metFORMIN (GLUCOPHAGE) 1000 MG tablet, TAKE 1 TABLET BY MOUTH TWICE A DAY WITH FOOD, Disp: 180 tablet, Rfl: 3   pantoprazole (PROTONIX) 40 MG tablet, TAKE 1 TABLET BY MOUTH EVERY DAY, Disp: 90 tablet, Rfl: 3   traMADol (ULTRAM) 50 MG tablet, TAKE 1 TABLET BY MOUTH EVERY 6 HOURS AS NEEDED, Disp: 60 tablet, Rfl:  2   Vital signs in last 24 hrs: Vitals:   10/20/21 0857  BP: 128/70  Pulse: 80   Wt Readings from Last 3 Encounters:  10/20/21 240 lb (108.9 kg)  10/04/21 247 lb (112 kg)  09/21/21 247 lb (112 kg)    Physical Exam  Well-appearing HEENT: sclera anicteric, oral mucosa moist without lesions Neck: supple, no thyromegaly, JVD or lymphadenopathy Cardiac: RRR without murmurs, S1S2 heard, no peripheral edema Pulm: clear to auscultation bilaterally, normal RR and effort noted Abdomen: soft, no tenderness, with active bowel sounds.  Evaluation for mass or hepatosplenomegaly limited by obesity/body habitus Skin; warm and  dry, no jaundice or rash  Labs:     Latest Ref Rng & Units 09/21/2021   10:02 AM 07/20/2021    4:04 PM 04/06/2021   10:31 AM  CBC  WBC 4.0 - 10.5 K/uL 6.1   8.1   7.1    Hemoglobin 13.0 - 17.0 g/dL 11.1   11.3   11.4    Hematocrit 39.0 - 52.0 % 34.9   35.6   36.7    Platelets 150.0 - 400.0 K/uL 339.0   295.0   331.0     Iron/TIBC/Ferritin/ %Sat    Component Value Date/Time   IRON 46 04/06/2021 1031   TIBC 404.6 04/06/2021 1031   FERRITIN 79.8 04/06/2021 1031   IRONPCTSAT 11.4 (L) 04/06/2021 1031   Recent B12 normal Folate normal in 2020 ___________________________________________ Radiologic studies:   ____________________________________________ Other:   _____________________________________________ Assessment & Plan  Assessment: Encounter Diagnoses  Name Primary?   Iron deficiency anemia, unspecified iron deficiency anemia type Yes   Gastric AVM     Chronic iron deficiency anemia, though it is not clear if it is entirely on the basis of chronic GI blood loss.  This reported black tarry stool even before starting iron is not clearly melena.  In the small nonbleeding AVMs in the stomach seem unlikely to be sources of overt bleeding.  It is not even clear to what extent they are causing chronic occult GI blood loss and therefore I deficiency anemia.  However, no other sources of blood loss are seen on capsule study or colonoscopy, understanding the inherent limitations of small bowel visualization on VCE.  Therefore, I recommend he have upper endoscopy with APC ablation of the gastric AVMs.  If he continues to be anemic requiring iron supplement after that, then he needs a referral to hematology and consideration of IV iron use.  He may have poor small bowel iron absorption as a cause of anemia and also a limiting factor on the utility of oral iron supplement.  Plan: CBC, iron studies today EGD at [facility] long with APC of gastric AVMs.  He was agreeable after discussion of  procedure and risks.  The benefits and risks of the planned procedure were described in detail with the patient or (when appropriate) their health care proxy.  Risks were outlined as including, but not limited to, bleeding, infection, perforation, adverse medication reaction leading to cardiac or pulmonary decompensation, pancreatitis (if ERCP).  The limitation of incomplete mucosal visualization was also discussed.  No guarantees or warranties were given.  Possible referral to hematology following that  31 minutes were spent on this encounter (including chart review, history/exam, counseling/coordination of care, and documentation) > 50% of that time was spent on counseling and coordination of care.   Nelida Meuse III

## 2021-10-25 NOTE — Progress Notes (Unsigned)
Patient ID: Diontre Harps, male   DOB: 1960-07-12, 61 y.o.   MRN: [id number]           Reason for Appointment: Type II Diabetes follow-up   History of Present Illness   Diagnosis date: 2010  Previous history:  Recent history:     Previously on metformin and started on insulin in 10/21 He has only been on NPH A1c range in the last few years: 8.1-12.1  Non-insulin hypoglycemic drugs: Metformin 1 g twice daily, glipizide ER 10 mg daily     Insulin regimen:   60 NPH bid before meals     Side effects from medications: None  Current self management, blood sugar patterns and problems identified:  A1c is consistently above 8% this year He says after his last visit because of low sugars during the day he change his insulin to 2 separate doses and not taking 1 shot in the morning He thinks his blood sugars are more evenly controlled with this but is still very He has not checked readings after meals and lab glucose was 218 midmorning recently He is not doing much exercise because of knee pain He is having difficulty losing weight despite seeing the dietitian a couple of years ago  Exercise:none  Diet management: Dinner 7 pm      Monitors blood glucose: Once or twice a day.    Glucometer: [employer] brand          Blood Glucose readings from recall:   PRE-MEAL Fasting Lunch Dinner Bedtime Overall  Glucose range: 100-200  100-200    Mean/median:        Hypoglycemia:  none recently                        Dietician visit: Most recent: 03/2020    Weight control:  Wt Readings from Last 3 Encounters:  10/26/21 245 lb 3.2 oz (111.2 kg)  10/20/21 240 lb (108.9 kg)  10/04/21 247 lb (112 kg)            Diabetes labs:  Lab Results  Component Value Date   HGBA1C 8.5 (A) 08/24/2021   HGBA1C 8.2 (A) 06/22/2021   HGBA1C 9.8 (A) 03/22/2021   Lab Results  Component Value Date   MICROALBUR <0.7 09/21/2021   LDLCALC 53 09/21/2021   CREATININE 1.29 09/21/2021     Allergies as  of 10/26/2021   No Known Allergies      Medication List        Accurate as of Oct 26, 2021  8:45 AM. If you have any questions, ask your nurse or doctor.          allopurinol 100 MG tablet Commonly known as: ZYLOPRIM TAKE 1 TABLET BY MOUTH TWICE A DAY   cyclobenzaprine 5 MG tablet Commonly known as: FLEXERIL TAKE 1 TABLET BY MOUTH THREE TIMES A DAY AS NEEDED FOR MUSCLE SPASMS   dapagliflozin propanediol 5 MG Tabs tablet Commonly known as: Farxiga Take 1 tablet (5 mg total) by mouth daily. Started by: Elayne Snare, MD   diclofenac Sodium 1 % Gel Commonly known as: VOLTAREN Apply 2 g topically 4 (four) times daily.   docusate sodium 100 MG capsule Commonly known as: COLACE Take 2 capsules (200 mg total) by mouth daily as needed for mild constipation.   ferrous sulfate 325 (65 FE) MG EC tablet Take 1 tablet (325 mg total) by mouth 2 (two) times daily.   gabapentin 100 MG capsule Commonly known  as: NEURONTIN Take 2 capsules (200 mg total) by mouth at bedtime.   glipiZIDE 10 MG 24 hr tablet Commonly known as: GLUCOTROL XL TAKE 1 TABLET BY MOUTH TWICE A DAY   hydrochlorothiazide 25 MG tablet Commonly known as: HYDRODIURIL Take 1 tablet (25 mg total) by mouth daily.   indomethacin 25 MG capsule Commonly known as: INDOCIN TAKE 1 CAPSULE BY MOUTH TWICE A DAY AS NEEDED   insulin NPH Human 100 UNIT/ML injection Commonly known as: NovoLIN N ReliOn Inject 1.1 mLs (110 Units total) into the skin every morning. And syringes 1/day   lisinopril 20 MG tablet Commonly known as: ZESTRIL TAKE 1 TABLET BY MOUTH EVERY DAY   lovastatin 40 MG tablet Commonly known as: MEVACOR TAKE 1 TABLET BY MOUTH AT BEDTIME **ANNUAL APPT DUE IN JUNE,MUST SEE PROVIDER FOR REFILLS*   meloxicam 15 MG tablet Commonly known as: MOBIC Take 1 tablet (15 mg total) by mouth daily as needed for pain.   metFORMIN 1000 MG tablet Commonly known as: GLUCOPHAGE TAKE 1 TABLET BY MOUTH TWICE A DAY WITH  FOOD   pantoprazole 40 MG tablet Commonly known as: PROTONIX TAKE 1 TABLET BY MOUTH EVERY DAY   traMADol 50 MG tablet Commonly known as: ULTRAM TAKE 1 TABLET BY MOUTH EVERY 6 HOURS AS NEEDED        Allergies: No Known Allergies  Past Medical History:  Diagnosis Date   Chicken pox    Diabetes mellitus without complication ([place])    Essential hypertension    GERD (gastroesophageal reflux disease)    Gouty arthritis    Hypertension    Iron deficiency anemia    Vitamin D deficiency     Past Surgical History:  Procedure Laterality Date   INGUINAL HERNIA REPAIR Right 12/19/2017   Procedure: HERNIA REPAIR INGUINAL RIGHT;  Surgeon: Coralie Keens, MD;  Location: [facility];  Service: General;  Laterality: Right;   INSERTION OF MESH Right 12/19/2017   Procedure: INSERTION OF MESH;  Surgeon: Coralie Keens, MD;  Location: [facility];  Service: General;  Laterality: Right;   MASS EXCISION Right 12/19/2017   Procedure: EXCISION OF RIGHT SCROTAL MASS;  Surgeon: Coralie Keens, MD;  Location: [facility];  Service: General;  Laterality: Right;   TONSILLECTOMY      Family History  Problem Relation Age of Onset   Heart disease Mother    Hypertension Mother    Diabetes Mother    Stroke Father    Hypertension Father    Stroke Brother     Social History:  reports that he has quit smoking. His smoking use included cigarettes. He has a 50.00 pack-year smoking history. He has quit using smokeless tobacco.  His smokeless tobacco use included chew. He reports that he does not currently use alcohol. He reports that he does not use drugs.  Review of Systems:  Last diabetic eye exam date: 2021?  Last foot exam date:4/23 No symptoms of numbness in the feet   Microalbumin normal as of 4/23  Hypertension: Treated by PCP with HCTZ and lisinopril  BP Readings from Last 3 Encounters:  10/26/21 112/70  10/20/21 128/70  10/04/21 106/62    Lipids: Controlled with lovastatin 40 mg prescribed  by Dr. Jenny Reichmann    Lab Results  Component Value Date   CHOL 123 09/21/2021   CHOL 140 03/23/2021   CHOL 136 05/18/2020   Lab Results  Component Value Date   HDL 32.70 (L) 09/21/2021   HDL 35.80 (L) 03/23/2021  HDL 38.20 (L) 05/18/2020   Lab Results  Component Value Date   LDLCALC 53 09/21/2021   LDLCALC 73 03/23/2021   LDLCALC 68 05/18/2020   Lab Results  Component Value Date   TRIG 188.0 (H) 09/21/2021   TRIG 155.0 (H) 03/23/2021   TRIG 147.0 05/18/2020   Lab Results  Component Value Date   CHOLHDL 4 09/21/2021   CHOLHDL 4 03/23/2021   CHOLHDL 4 05/18/2020   No results found for: LDLDIRECT   Examination:   BP 112/70   Pulse (!) 56   Ht '5\' 7"'$  (1.702 m)   Wt 245 lb 3.2 oz (111.2 kg)   SpO2 98%   BMI 38.40 kg/m   Body mass index is 38.4 kg/m.    ASSESSMENT/ PLAN:    Diabetes type 2:   Current regimen:  Blood glucose control is consistently poor with A1c recently 8.5 Indicated to the patient that his average blood sugars close to 200 even though his blood sugars before meals are generally lower Likely has postprandial hyperglycemia at times which he does not monitor  Given his multiple risk factors of obesity, diabetes, hypertension and hypercholesterolemia he needs to be given at risk modifying medication for his diabetes Have given him patient information on Farxiga and helped him scan in the co-pay card on his phone Would be simplest to have him start Farxiga 5 mg daily Discussed action of SGLT 2 drugs on lowering glucose by decreasing kidney absorption of glucose, benefits of weight loss and lower blood pressure, possible side effects   With starting Wilder Glade is likely can stop HCTZ which appears to be keeping his blood pressure low normal and occasional orthostatic symptoms Discussed that he needs to likely reduce his insulin by 5 units twice daily when his blood sugars start coming down below 100 Encouraged him to start walking for exercise He was  instructed on monitoring after meals and discussed blood sugar targets  Also discussed benefits of CGM and monitoring his blood sugar and will prescribe freestyle Libre 2 system for him If this is covered by his insurance will have him start using this after instructions   LIPID: LDL is well controlled but still has high triglycerides, not clear if this was a fasting lab and he will need to be working on diet and weight loss  He will discuss his GI problems and history of melena with his GI doctor and PCP, may be related to Indocin   Patient Instructions  Walk upto 30 min daily  Check blood sugars on waking up 3-4 days a week  Also check blood sugars about 2 hours after meals and do this after different meals by rotation  Recommended blood sugar levels on waking up are 90-130 and about 2 hours after meal is 130-180  Please bring your blood sugar monitor to each visit, thank you  If sugar goes below 100 in am with new Rx reduce insulin by 5  Stop  HCTZ with Lucky Cowboy 10/26/2021, 8:45 AM

## 2021-10-26 ENCOUNTER — Ambulatory Visit (INDEPENDENT_AMBULATORY_CARE_PROVIDER_SITE_OTHER): Payer: 59 | Admitting: Endocrinology

## 2021-10-26 ENCOUNTER — Encounter: Payer: Self-pay | Admitting: Endocrinology

## 2021-10-26 VITALS — BP 112/70 | HR 56 | Ht 67.0 in | Wt 245.2 lb

## 2021-10-26 DIAGNOSIS — E782 Mixed hyperlipidemia: Secondary | ICD-10-CM

## 2021-10-26 DIAGNOSIS — I10 Essential (primary) hypertension: Secondary | ICD-10-CM | POA: Diagnosis not present

## 2021-10-26 DIAGNOSIS — E1165 Type 2 diabetes mellitus with hyperglycemia: Secondary | ICD-10-CM | POA: Diagnosis not present

## 2021-10-26 DIAGNOSIS — Z794 Long term (current) use of insulin: Secondary | ICD-10-CM | POA: Diagnosis not present

## 2021-10-26 MED ORDER — DAPAGLIFLOZIN PROPANEDIOL 5 MG PO TABS: 5.0000 mg | ORAL_TABLET | Freq: Every day | ORAL | 3 refills | Status: DC

## 2021-10-26 MED ORDER — FREESTYLE LIBRE 2 SENSOR MISC: 2.0000 | 3 refills | Status: DC

## 2021-10-26 MED ORDER — FREESTYLE LIBRE 2 READER DEVI: 1.0000 | Freq: Once | 0 refills | Status: AC

## 2021-10-26 NOTE — Patient Instructions (Addendum)
Walk upto 30 min daily  Check blood sugars on waking up 3-4 days a week  Also check blood sugars about 2 hours after meals and do this after different meals by rotation  Recommended blood sugar levels on waking up are 90-130 and about 2 hours after meal is 130-180  Please bring your blood sugar monitor to each visit, thank you  If sugar goes below 100 in am with new Rx reduce insulin by 5  Stop  HCTZ with Wilder Glade

## 2021-10-31 ENCOUNTER — Encounter: Payer: 59 | Attending: Endocrinology | Admitting: Nutrition

## 2021-10-31 DIAGNOSIS — E1165 Type 2 diabetes mellitus with hyperglycemia: Secondary | ICD-10-CM | POA: Insufficient documentation

## 2021-10-31 NOTE — Progress Notes (Signed)
Mr. Quiroa is here to loose weight and to learn how to use the [place] 2.  He reports that he did not pick up the device at the pharmacy due to the cost of over $200.00.  His insurance was called and they preferred device is the Dexcom.  This was ordered and a prior authorization was done for this while on the phone with his insurance. Low blood sugars:  Says has had none since Dr. Dwyane Dee split his dose of NPH insulin to twice a day He says that he wants to loose weight. We discussed that that this will help him to control his blood sugars better. He admits to testing only 1X/day, and that is ususally in the AM, ac bfast. He did not bring his meter.   He eats out at every meal, and his diet is high in sugar and fat.  For this reason, he was given a Libre 2 and linked this to his phone and to [employer].  I am hoping that seeing his blood sugar readings will help him limit his sweet drink usage. Diet:  Typical day: 4AM:  1 coffee mug with 1 tsp of sugar and 4 creamers 9AM:  Bfast:  from supper meal:  fried chicken with mash potatoes and gravey, or spagetti, or eggs, sausage, grits with toast and "a lot of jelly"., or McDonalds egg and sausage Mac Muffin.  1PM: fast food of [employer] 1/4 pounder with cheese, usually no fries, just a water to drink, or Whopper with water, or 12 inch sub, 6PM: Cafeteria with meat: fried chicken mash potatoes, or spaghetti, or fish with coleslaw, or ham, with mash potatoes, or mac and cheese.  A 20 ounce Coke that is filled twice.  Desert of pie or cake happens 1-2 X/wk.   9PM: Snacks 3-4X/wk.  Usually  Discussion:   How to use the [employer] 2 and the need to do this every 8 hours at least-before meals and at HS.   Goals for blood sugar readings:  ac: less than 120, and 2hr pc: less than 160. Need to stop all sweet drinks- list of drinks with 20 calories or less to try.  Does not like artifical sweetener  Suggested Truvia.   Discussed how to limit fats in diet by reducing  cheese on sandwiches, swiching to baked chicken and fish, ordering toast and eggs cooked without oil or butter, better vegetable choices like green beans or cabbage in stead of mac and cheese, and .fries. Limit HS snacking to less than 150 calories.  List of sample snacks given to him which includes 15g of cab and one ounce of protein. Need to watch portions sizes of sandwiches like eating 6 inch sub,, smaller portions of hamburgers, 1/2ing sweets, link reducing amount of cookies and eating only 1/2 of the desert at the cafeterial Need for exercise to help with reduce blood sugars, burn calories, increase muscle size and metabolism. Low blood sugar symptoms/treatment and need to call office if blood sugars drop low, to reduce insulin doses.  If he truly reduces his sweet drinks and meal portions, than the risk for lows increase.   Return in 4 weeks for training on Dexcom and weight monitoring

## 2021-11-01 NOTE — Patient Instructions (Addendum)
Stop all sweet drinks, like coke and sweat tea Walk for 30-40 minutes 4-5 days/wk No cheese on sandwichs, and limit amounts of fats when eating out, but ordering butter, sour cream, dressings on the side  Eat only half of 12 inch sub for the meal.   Follow meal plan given and call if questions When you get your Dexcom, call if you need assistance with starting this.

## 2021-11-15 ENCOUNTER — Telehealth: Payer: Self-pay | Admitting: Pharmacy Technician

## 2021-11-15 NOTE — Telephone Encounter (Signed)
Patient Advocate Encounter  Received notification from [employer] that the request for prior authorization for [employer] and Receiver has been denied due to lack of evidence of rapid acting or regular insulin.     This determination is currently being appealed.  Called [phone] to appeal. Faxed office notes to [phone]   Case # [id number] (sensors)  [phone] (receiver)  This encounter will continue to be updated until final determination.

## 2021-11-17 ENCOUNTER — Other Ambulatory Visit (HOSPITAL_COMMUNITY): Payer: Self-pay

## 2021-11-17 ENCOUNTER — Other Ambulatory Visit: Payer: Self-pay | Admitting: Endocrinology

## 2021-11-17 MED ORDER — DEXCOM G7 SENSOR MISC: 1.0000 | 3 refills | Status: DC

## 2021-11-17 NOTE — Telephone Encounter (Signed)
Patient Advocate Encounter  Prior Authorization for Dexcom G7 sensors and receiver has been approved.    Effective dates: 11/15/21 through 11/16/22  Per Test Claim Patients co-pay is $45 (3 month supply of sensors) $45 receiver.   Spoke to the pharmacy to process, but they do not have the rx on file.

## 2021-11-19 ENCOUNTER — Other Ambulatory Visit: Payer: Self-pay | Admitting: Internal Medicine

## 2021-11-23 ENCOUNTER — Encounter (HOSPITAL_COMMUNITY): Payer: Self-pay | Admitting: Gastroenterology

## 2021-11-30 ENCOUNTER — Telehealth: Payer: Self-pay

## 2021-11-30 NOTE — Telephone Encounter (Signed)
Called and spoke with patient to confirm EGD procedure appt at [facility] with Dr. Loletha Carrow on Monday, 12/05/21 at 10:00 am. Pt confirmed that he has his instructions. Pt is aware that he will need to arrive at [facility] by 8:30 am with a care partner. Pt verbalized understanding and had no concerns at the end of the call.

## 2021-12-05 ENCOUNTER — Encounter (HOSPITAL_COMMUNITY): Payer: Self-pay | Admitting: Gastroenterology

## 2021-12-05 ENCOUNTER — Ambulatory Visit (HOSPITAL_BASED_OUTPATIENT_CLINIC_OR_DEPARTMENT_OTHER): Payer: 59 | Admitting: Anesthesiology

## 2021-12-05 ENCOUNTER — Encounter (HOSPITAL_COMMUNITY)
Admission: RE | Disposition: A | Discharge status: Home / Self Care | Payer: Self-pay | Source: Ambulatory Visit | Attending: Gastroenterology

## 2021-12-05 ENCOUNTER — Ambulatory Visit (HOSPITAL_COMMUNITY)
Admission: RE | Admit: 2021-12-05 | Discharge: 2021-12-05 | Disposition: A | Discharge status: Home / Self Care | Payer: 59 | Source: Ambulatory Visit | Attending: Gastroenterology | Admitting: Gastroenterology

## 2021-12-05 ENCOUNTER — Ambulatory Visit (HOSPITAL_COMMUNITY): Payer: 59 | Admitting: Anesthesiology

## 2021-12-05 ENCOUNTER — Other Ambulatory Visit: Payer: Self-pay

## 2021-12-05 DIAGNOSIS — I10 Essential (primary) hypertension: Secondary | ICD-10-CM | POA: Diagnosis not present

## 2021-12-05 DIAGNOSIS — Z7984 Long term (current) use of oral hypoglycemic drugs: Secondary | ICD-10-CM | POA: Insufficient documentation

## 2021-12-05 DIAGNOSIS — K219 Gastro-esophageal reflux disease without esophagitis: Secondary | ICD-10-CM | POA: Insufficient documentation

## 2021-12-05 DIAGNOSIS — K31819 Angiodysplasia of stomach and duodenum without bleeding: Secondary | ICD-10-CM

## 2021-12-05 DIAGNOSIS — E119 Type 2 diabetes mellitus without complications: Secondary | ICD-10-CM | POA: Diagnosis not present

## 2021-12-05 DIAGNOSIS — Z87891 Personal history of nicotine dependence: Secondary | ICD-10-CM | POA: Insufficient documentation

## 2021-12-05 DIAGNOSIS — D509 Iron deficiency anemia, unspecified: Secondary | ICD-10-CM

## 2021-12-05 DIAGNOSIS — D500 Iron deficiency anemia secondary to blood loss (chronic): Secondary | ICD-10-CM | POA: Insufficient documentation

## 2021-12-05 DIAGNOSIS — K3189 Other diseases of stomach and duodenum: Secondary | ICD-10-CM

## 2021-12-05 DIAGNOSIS — Z794 Long term (current) use of insulin: Secondary | ICD-10-CM | POA: Insufficient documentation

## 2021-12-05 HISTORY — PX: BIOPSY: SHX5522

## 2021-12-05 HISTORY — PX: HOT HEMOSTASIS: SHX5433

## 2021-12-05 HISTORY — PX: ESOPHAGOGASTRODUODENOSCOPY: SHX5428

## 2021-12-05 LAB — GLUCOSE, CAPILLARY: Glucose-Capillary: 247 mg/dL — ABNORMAL HIGH (ref 70–99)

## 2021-12-05 SURGERY — EGD (ESOPHAGOGASTRODUODENOSCOPY)
Anesthesia: Monitor Anesthesia Care

## 2021-12-05 MED ORDER — LACTATED RINGERS IV SOLN: INTRAVENOUS | Status: DC

## 2021-12-05 MED ORDER — LIDOCAINE HCL (CARDIAC) PF 100 MG/5ML IV SOSY
PREFILLED_SYRINGE | INTRAVENOUS | Status: DC | PRN
  Administered 2021-12-05: 100 mg via INTRAVENOUS

## 2021-12-05 MED ORDER — SODIUM CHLORIDE 0.9 % IV SOLN: INTRAVENOUS | Status: DC

## 2021-12-05 MED ORDER — PROPOFOL 500 MG/50ML IV EMUL
INTRAVENOUS | Status: DC | PRN
  Administered 2021-12-05: 120 ug/kg/min via INTRAVENOUS

## 2021-12-05 MED ORDER — PROPOFOL 10 MG/ML IV BOLUS
INTRAVENOUS | Status: DC | PRN
  Administered 2021-12-05: 60 mg via INTRAVENOUS

## 2021-12-05 NOTE — Anesthesia Postprocedure Evaluation (Signed)
Anesthesia Post Note  Patient: Steven [name]  Procedure(s) Performed: ESOPHAGOGASTRODUODENOSCOPY (EGD) HOT HEMOSTASIS (ARGON PLASMA COAGULATION/BICAP) BIOPSY     Patient location during evaluation: Endoscopy Anesthesia Type: MAC Level of consciousness: awake and alert Pain management: pain level controlled Vital Signs Assessment: post-procedure vital signs reviewed and stable Respiratory status: spontaneous breathing, nonlabored ventilation, respiratory function stable and patient connected to nasal cannula oxygen Cardiovascular status: blood pressure returned to baseline and stable Postop Assessment: no apparent nausea or vomiting Anesthetic complications: no   No notable events documented.  Last Vitals:  Vitals:   12/05/21 1050 12/05/21 1100  BP: 119/71 127/82  Pulse: 61 68  Resp: (!) 30 20  Temp:    SpO2: 96% 98%    Last Pain:  Vitals:   12/05/21 1100  TempSrc:   PainSc: 0-No pain                 [name] [name]

## 2021-12-05 NOTE — Op Note (Signed)
[facility] Patient Name: Steven [name] Procedure Date: 12/05/2021 MRN: [id number] Attending MD: Estill Cotta. Danis , MD Date of Birth: Dec 27, 1960 CSN: [id number] Age: 61 Admit Type: Outpatient Procedure:                Upper GI endoscopy Indications:              Iron deficiency anemia secondary to chronic blood                            loss, Arteriovenous malformation in the stomach                            (reported on EGD 3 months ago)                           No source of blood loss on colonoscopy or VCE Providers:                Mallie Mussel L. Loletha Carrow, MD, Allayne Gitelman, RN, Darliss Cheney,                            Technician Referring MD:             [name], MD Medicines:                Monitored Anesthesia Care Complications:            No immediate complications. Estimated Blood Loss:     Estimated blood loss was minimal. Procedure:                Pre-Anesthesia Assessment:                           - Prior to the procedure, a History and Physical                            was performed, and patient medications and                            allergies were reviewed. The patient's tolerance of                            previous anesthesia was also reviewed. The risks                            and benefits of the procedure and the sedation                            options and risks were discussed with the patient.                            All questions were answered, and informed consent                            was obtained. Prior Anticoagulants: The patient has  taken no previous anticoagulant or antiplatelet                            agents. ASA Grade Assessment: III - A patient with                            severe systemic disease. After reviewing the risks                            and benefits, the patient was deemed in                            satisfactory condition to undergo the procedure.                            After obtaining informed consent, the endoscope was                            passed under direct vision. Throughout the                            procedure, the patient's blood pressure, pulse, and                            oxygen saturations were monitored continuously. The                            [id number]-H190 ([id number] Olympus endoscope was introduced                            through the mouth, and advanced to the third part                            of duodenum. The upper GI endoscopy was                            accomplished without difficulty. The patient                            tolerated the procedure well. Scope In: Scope Out: Findings:      The esophagus was normal.      Patchy atrophic mucosa was found in the entire examined stomach.       Biopsies were taken with a cold forceps for histology.      Localized moderately friable mucosa with spontaneous bleeding was found       in the gastric fundus. Coagulation for hemostasis using argon plasma at       0.5 liters/minute and 40 watts was successful. No AVMs were found in the       stmoach as previously reported.      The cardia and gastric fundus were normal on retroflexion.      The examined duodenum was normal. Impression:               - Normal esophagus.                           -  Gastric mucosal atrophy. Biopsied.                           - Friable gastric mucosa. Treated with argon plasma                            coagulation (APC).                           - Normal examined duodenum.                           Even if H pylori negative, this patch of friable                            gastric mucosal atrophy is the only visible culprit                            for intermittent GI blood loss. Moderate Sedation:      MAC sedation used Recommendation:           - Patient has a contact number available for                            emergencies. The signs and symptoms of potential                             delayed complications were discussed with the                            patient. Return to normal activities tomorrow.                            Written discharge instructions were provided to the                            patient.                           - Resume previous diet.                           - Continue present medications.                           - Await pathology results.                           - Return to my office lab in 2 weeks for CBC and                            iron studies. Procedure Code(s):        --- Professional ---                           49179, 59, Esophagogastroduodenoscopy, flexible,  transoral; with control of bleeding, any method                           43239, Esophagogastroduodenoscopy, flexible,                            transoral; with biopsy, single or multiple Diagnosis Code(s):        --- Professional ---                           K31.89, Other diseases of stomach and duodenum                           D50.0, Iron deficiency anemia secondary to blood                            loss (chronic)                           K31.819, Angiodysplasia of stomach and duodenum                            without bleeding CPT copyright 2019 American Medical Association. All rights reserved. The codes documented in this report are preliminary and upon coder review may  be revised to meet current compliance requirements. Jodene Polyak L. Loletha Carrow, MD 12/05/2021 10:48:05 AM This report has been signed electronically. Number of Addenda: 0

## 2021-12-05 NOTE — Interval H&P Note (Signed)
History and Physical Interval Note:  12/05/2021 10:08 AM  [name]  has presented today for surgery, with the diagnosis of IDA / Gastric AVMS.  The various methods of treatment have been discussed with the patient and family. After consideration of risks, benefits and other options for treatment, the patient has consented to  Procedure(s): ESOPHAGOGASTRODUODENOSCOPY (EGD) (N/A) HOT HEMOSTASIS (ARGON PLASMA COAGULATION/BICAP) (N/A) as a surgical intervention.  The patient's history has been reviewed, patient examined, no change in status, stable for surgery.  I have reviewed the patient's chart and labs.  Questions were answered to the patient's satisfaction.     Steven [name]

## 2021-12-05 NOTE — Anesthesia Preprocedure Evaluation (Addendum)
Anesthesia Evaluation  Patient identified by MRN, date of birth, ID band Patient awake    Reviewed: Allergy & Precautions, NPO status , Patient's Chart, lab work & pertinent test results, reviewed documented beta blocker date and time   Airway Mallampati: II  TM Distance: >3 FB Neck ROM: Full    Dental  (+) Dental Advisory Given, Chipped,    Pulmonary neg pulmonary ROS, former smoker,    Pulmonary exam normal breath sounds clear to auscultation       Cardiovascular hypertension, Pt. on medications Normal cardiovascular exam Rhythm:Regular Rate:Normal     Neuro/Psych negative neurological ROS  negative psych ROS   GI/Hepatic Neg liver ROS, GERD  Medicated,  Endo/Other  diabetes, Type 2, Oral Hypoglycemic Agents, Insulin Dependent  Renal/GU negative Renal ROS  negative genitourinary   Musculoskeletal  (+) Arthritis ,   Abdominal   Peds  Hematology negative hematology ROS (+)   Anesthesia Other Findings   Reproductive/Obstetrics                            Anesthesia Physical Anesthesia Plan  ASA: 3  Anesthesia Plan: MAC   Post-op Pain Management:    Induction: Intravenous  PONV Risk Score and Plan: Propofol infusion and Treatment may vary due to age or medical condition  Airway Management Planned: Natural Airway  Additional Equipment:   Intra-op Plan:   Post-operative Plan:   Informed Consent: I have reviewed the patients History and Physical, chart, labs and discussed the procedure including the risks, benefits and alternatives for the proposed anesthesia with the patient or authorized representative who has indicated his/her understanding and acceptance.     Dental advisory given  Plan Discussed with: CRNA  Anesthesia Plan Comments:         Anesthesia Quick Evaluation

## 2021-12-05 NOTE — Transfer of Care (Signed)
Immediate Anesthesia Transfer of Care Note  Patient: Steven [name]  Procedure(s) Performed: ESOPHAGOGASTRODUODENOSCOPY (EGD) HOT HEMOSTASIS (ARGON PLASMA COAGULATION/BICAP) BIOPSY  Patient Location: PACU and Endoscopy Unit  Anesthesia Type:MAC  Level of Consciousness: drowsy  Airway & Oxygen Therapy: Patient Spontanous Breathing  Post-op Assessment: Report given to RN and Post -op Vital signs reviewed and stable  Post vital signs: Reviewed and stable  Last Vitals:  Vitals Value Taken Time  BP 119/71 12/05/21 1050  Temp 36.4 C 12/05/21 1039  Pulse 62 12/05/21 1056  Resp 23 12/05/21 1056  SpO2 97 % 12/05/21 1056  Vitals shown include unvalidated device data.  Last Pain:  Vitals:   12/05/21 1050  TempSrc:   PainSc: 0-No pain         Complications: No notable events documented.

## 2021-12-05 NOTE — H&P (Signed)
History and Physical:  This patient presents for endoscopic testing for: Iron deficiency anemia of chronic GI blood loss Gastric AVMs found on upper endoscopy April 2023 Clinical details in GI office note of 10/20/2021.  Video capsule study showed no additional sources of blood loss.  Steven [name] has remained on iron supplements and has no other clinical changes since I last saw him in the office.  Patient is otherwise without complaints or active issues today.   Past Medical History: Past Medical History:  Diagnosis Date   Chicken pox    Diabetes mellitus without complication ([place])    Essential hypertension    GERD (gastroesophageal reflux disease)    Gouty arthritis    Hypertension    Iron deficiency anemia    Vitamin D deficiency      Past Surgical History: Past Surgical History:  Procedure Laterality Date   INGUINAL HERNIA REPAIR Right 12/19/2017   Procedure: HERNIA REPAIR INGUINAL RIGHT;  Surgeon: Coralie Keens, MD;  Location: [facility];  Service: General;  Laterality: Right;   INSERTION OF MESH Right 12/19/2017   Procedure: INSERTION OF MESH;  Surgeon: Coralie Keens, MD;  Location: [facility];  Service: General;  Laterality: Right;   MASS EXCISION Right 12/19/2017   Procedure: EXCISION OF RIGHT SCROTAL MASS;  Surgeon: Coralie Keens, MD;  Location: [facility];  Service: General;  Laterality: Right;   TONSILLECTOMY      Allergies: No Known Allergies  Outpatient Meds: Current Facility-Administered Medications  Medication Dose Route Frequency Provider Last Rate Last Admin   0.9 %  sodium chloride infusion   Intravenous Continuous Danis, Estill Cotta III, MD       lactated ringers infusion   Intravenous Continuous Nelida Meuse III, MD 10 mL/hr at 12/05/21 0954 Continued from Pre-op at 12/05/21 0954      ___________________________________________________________________ Objective   Exam:  BP 121/68   Pulse 61   Temp 97.9 F (36.6 C) (Oral)   Resp 16   Ht '5\' 7"'$  (1.702  m)   Wt 108.9 kg   SpO2 98%   BMI 37.60 kg/m   CV: RRR without murmur, S1/S2 Resp: clear to auscultation bilaterally, normal RR and effort noted GI: soft, no tenderness, with active bowel sounds.   Assessment: Iron deficiency anemia of chronic GI blood loss Gastric AVMs   Plan: EGD with APC   The patient is appropriate for an endoscopic procedure in the ambulatory setting.   - Wilfrid Lund, MD

## 2021-12-05 NOTE — Discharge Instructions (Signed)
YOU HAD AN ENDOSCOPIC PROCEDURE TODAY: Refer to the procedure report and other information in the discharge instructions given to you for any specific questions about what was found during the examination. If this information does not answer your questions, please call [place] office at [phone] to clarify.   YOU SHOULD EXPECT: Some feelings of bloating in the abdomen. Passage of more gas than usual. Walking can help get rid of the air that was put into your GI tract during the procedure and reduce the bloating. If you had a lower endoscopy (such as a colonoscopy or flexible sigmoidoscopy) you may notice spotting of blood in your stool or on the toilet paper. Some abdominal soreness may be present for a day or two, also.  DIET: Your first meal following the procedure should be a light meal and then it is ok to progress to your normal diet. A half-sandwich or bowl of soup is an example of a good first meal. Heavy or fried foods are harder to digest and may make you feel nauseous or bloated. Drink plenty of fluids but you should avoid alcoholic beverages for 24 hours. If you had a esophageal dilation, please see attached instructions for diet.    ACTIVITY: Your care partner should take you home directly after the procedure. You should plan to take it easy, moving slowly for the rest of the day. You can resume normal activity the day after the procedure however YOU SHOULD NOT DRIVE, use power tools, machinery or perform tasks that involve climbing or major physical exertion for 24 hours (because of the sedation medicines used during the test).   SYMPTOMS TO REPORT IMMEDIATELY: A gastroenterologist can be reached at any hour. Please call [phone]  for any of the following symptoms:   Following upper endoscopy (EGD, EUS, ERCP, esophageal dilation) Vomiting of blood or coffee ground material  New, significant abdominal pain  New, significant chest pain or pain under the shoulder blades  Painful or  persistently difficult swallowing  New shortness of breath  Black, tarry-looking or red, bloody stools  FOLLOW UP:  If any biopsies were taken you will be contacted by phone or by letter within the next 1-3 weeks. Call [phone]  if you have not heard about the biopsies in 3 weeks.  Please also call with any specific questions about appointments or follow up tests.  

## 2021-12-06 ENCOUNTER — Encounter (HOSPITAL_COMMUNITY): Payer: Self-pay | Admitting: Gastroenterology

## 2021-12-07 LAB — SURGICAL PATHOLOGY

## 2021-12-08 ENCOUNTER — Telehealth: Payer: Self-pay | Admitting: Dietician

## 2021-12-08 NOTE — Telephone Encounter (Signed)
Patient called to reschedule nutrition follow for diabetes.  He states that he Steven [name] is not sticking as well.  Told patient about the over patches that are for sale at local pharmacies.  Appointment made with patient for 12/20/21 with linda.  Antonieta Iba, RD, LDN, CDCES

## 2021-12-09 ENCOUNTER — Other Ambulatory Visit: Payer: Self-pay

## 2021-12-09 DIAGNOSIS — A048 Other specified bacterial intestinal infections: Secondary | ICD-10-CM

## 2021-12-09 MED ORDER — PANTOPRAZOLE SODIUM 40 MG PO TBEC: 40.0000 mg | DELAYED_RELEASE_TABLET | Freq: Every day | ORAL | 3 refills | Status: DC

## 2021-12-09 MED ORDER — DOXYCYCLINE HYCLATE 100 MG PO CAPS: 100.0000 mg | ORAL_CAPSULE | Freq: Two times a day (BID) | ORAL | 0 refills | Status: AC

## 2021-12-09 MED ORDER — METRONIDAZOLE 500 MG PO TABS: 500.0000 mg | ORAL_TABLET | Freq: Three times a day (TID) | ORAL | 0 refills | Status: AC

## 2021-12-09 MED ORDER — BISMUTH SUBSALICYLATE 262 MG PO TABS: 524.0000 mg | ORAL_TABLET | Freq: Four times a day (QID) | ORAL | 0 refills | Status: AC

## 2021-12-15 ENCOUNTER — Other Ambulatory Visit (INDEPENDENT_AMBULATORY_CARE_PROVIDER_SITE_OTHER): Payer: 59

## 2021-12-15 DIAGNOSIS — Z794 Long term (current) use of insulin: Secondary | ICD-10-CM | POA: Diagnosis not present

## 2021-12-15 DIAGNOSIS — E1165 Type 2 diabetes mellitus with hyperglycemia: Secondary | ICD-10-CM | POA: Diagnosis not present

## 2021-12-15 LAB — BASIC METABOLIC PANEL
BUN: 22 mg/dL (ref 6–23)
CO2: 24 mEq/L (ref 19–32)
Calcium: 9.1 mg/dL (ref 8.4–10.5)
Chloride: 102 mEq/L (ref 96–112)
Creatinine, Ser: 1.07 mg/dL (ref 0.40–1.50)
GFR: 73.88 mL/min (ref >60.00)
Glucose, Bld: 192 mg/dL — ABNORMAL HIGH (ref 70–99)
Potassium: 4 mEq/L (ref 3.5–5.1)
Sodium: 135 mEq/L (ref 135–145)

## 2021-12-15 LAB — HEMOGLOBIN A1C: Hgb A1c MFr Bld: 9 % — ABNORMAL HIGH (ref 4.6–6.5)

## 2021-12-16 LAB — FRUCTOSAMINE: Fructosamine: 261 umol/L (ref 0–285)

## 2021-12-20 ENCOUNTER — Ambulatory Visit: Payer: 59 | Admitting: Endocrinology

## 2021-12-20 ENCOUNTER — Encounter: Payer: Self-pay | Admitting: Endocrinology

## 2021-12-20 ENCOUNTER — Encounter: Payer: 59 | Attending: Endocrinology | Admitting: Nutrition

## 2021-12-20 VITALS — BP 122/78 | HR 64 | Wt 235.2 lb

## 2021-12-20 DIAGNOSIS — Z794 Long term (current) use of insulin: Secondary | ICD-10-CM | POA: Insufficient documentation

## 2021-12-20 DIAGNOSIS — E1165 Type 2 diabetes mellitus with hyperglycemia: Secondary | ICD-10-CM | POA: Diagnosis not present

## 2021-12-20 DIAGNOSIS — Z713 Dietary counseling and surveillance: Secondary | ICD-10-CM | POA: Diagnosis not present

## 2021-12-20 DIAGNOSIS — I10 Essential (primary) hypertension: Secondary | ICD-10-CM | POA: Diagnosis not present

## 2021-12-20 NOTE — Progress Notes (Signed)
Patient reports that his weight is down due to stomach issues and reducing portion sizes and sweet drinks,  Has reduced his sweet drinks to once a day-at evening meal, usually a regular Coke at [employer].  Most meals are eaten there.  Breakfast is a eating only a 1/2 a sausage biscuit, with diet drink, and lunch is a burger with no cheese, and no fries, and supper is a chicken sandwich with 1/2 order of fries.  He occasionally goes to cafeteria, which he will have chicken or meat of some kind, with 2 non starchy veg., and regular soda 4-5 days/wk. Insulin dose:  AM: 35-40u, PM: 30  Yesterday  took 40u in AM, and says blood sugar dropped during the day around 2PM, and he treated it with fruit.  His blood sugar was 90 acS, so he took no insulin last night.  He ate a fried chicken sandwhich, 1/2 order of fries and regular soda.  Says FBS today was 123.  He did not bring his meter He was trained on the use of the [place] CGM.  The app was linked to his sensor, but we were not able to set up his Dexcom Clarity app, due to a problem with the site.  It would not allow [place] to put in a user name. Discussion:  1. need to take PM dose always, but a reduced amount when blood sugar is lower                      2. Appropriate treatments of low blood sugars discussed

## 2021-12-20 NOTE — Patient Instructions (Addendum)
Blood sugar targets: Before meals 80-120  After meals 130-180  FARXIGA: Increase the dose to 2 tablets of the 5 mg and next prescription will be 10 mg To avoid low blood pressure stop the hydrochlorothiazide blood pressure pill Make sure you are drinking plenty of water  GLIPIZIDE: Stop the glipizide in the morning and continue in the evening only  INSULIN: Reduce the dose to 30 OR 35 units before breakfast and STOP  taking IN PM  If the blood sugar is low at the time of injection reduce dose by 5 units but do not skip Call if blood sugars are starting to get below 90 consistently  Start walking for exercise

## 2021-12-20 NOTE — Progress Notes (Signed)
Patient ID: Steven [name], male   DOB: [birth year], 61 y.o.   MRN: [id number]           Reason for Appointment: Type II Diabetes follow-up   History of Present Illness   Diagnosis date: 2010  Previous history:     Previously on metformin and started on insulin in 10/21 He has only been on NPH A1c range in the last few years: 8.1-12.1  Recent history:     Non-insulin hypoglycemic drugs: Farxiga 5 mg daily, glipizide ER 10 mg daily,  metformin 1 g twice daily:      Insulin regimen:   35-45 NPH bid before meals     Side effects from medications: None  Current self management, blood sugar patterns and problems identified:  A1c is 9% compared to 8.5 in March However fructosamine is 261  Today again he did not bring any record of his blood sugars He says after his last visit his blood sugars are generally lower  He now says that if his blood sugars are low normal which is mostly 80-90 he will skip his insulin especially at night  Today's blood sugar was 120 even though he skipped his insulin last night  He will sometimes feel weak when his blood sugars are low normal around 70 around lunchtime  Overall blood sugars are lower before dinner compared to morning but blood sugar after breakfast in the lab was 192  Also previously he was having difficulty losing weight he is now down 10 pounds since his last visit with adding FARXIGA 5 mg daily No side effects with this and renal function is stable  Previously had been taking as much as 110 units insulin daily total Although diet is somewhat better he still eating some fried food and fast food, he has cut back on his regular soft drinks but still has a regular Coke at dinnertime  Exercise: none  Diet management: Dinner 7 pm      Monitors blood glucose: Once or twice a day.    Glucometer: [employer] brand          Blood Glucose readings from recall:    PRE-MEAL Fasting Lunch Dinner Bedtime Overall  Glucose range: 120s 70 90+ 90-200    Mean/median:        Prior  PRE-MEAL Fasting Lunch Dinner Bedtime Overall  Glucose range: 100-200  100-200    Mean/median:        Hypoglycemia:  none recently                        Dietician visit: Most recent: 03/2020    Weight control:  Wt Readings from Last 3 Encounters:  12/20/21 235 lb 3.2 oz (106.7 kg)  12/05/21 240 lb 1.3 oz (108.9 kg)  10/26/21 245 lb 3.2 oz (111.2 kg)            Diabetes labs:  Lab Results  Component Value Date   HGBA1C 9.0 (H) 12/15/2021   HGBA1C 8.5 (A) 08/24/2021   HGBA1C 8.2 (A) 06/22/2021   Lab Results  Component Value Date   MICROALBUR <0.7 09/21/2021   LDLCALC 53 09/21/2021   CREATININE 1.07 12/15/2021     Allergies as of 12/20/2021   No Known Allergies      Medication List        Accurate as of December 20, 2021  1:46 PM. If you have any questions, ask your nurse or doctor.  allopurinol 100 MG tablet Commonly known as: ZYLOPRIM TAKE 1 TABLET BY MOUTH TWICE A DAY   Bismuth Subsalicylate 756 MG Tabs Take 2 tablets (524 mg total) by mouth in the morning, at noon, in the evening, and at bedtime for 14 days.   COQ-10 PO Take 1 tablet by mouth 2 (two) times daily.   cyclobenzaprine 5 MG tablet Commonly known as: FLEXERIL TAKE 1 TABLET BY MOUTH THREE TIMES A DAY AS NEEDED FOR MUSCLE SPASMS What changed: See the new instructions.   dapagliflozin propanediol 5 MG Tabs tablet Commonly known as: Farxiga Take 1 tablet (5 mg total) by mouth daily. What changed:  how much to take when to take this   Dexcom G7 Sensor Misc 1 Device by Does not apply route as directed. Change sensor every 10 days   diclofenac Sodium 1 % Gel Commonly known as: VOLTAREN Apply 2 g topically 4 (four) times daily. What changed:  when to take this reasons to take this   doxycycline 100 MG capsule Commonly known as: VIBRAMYCIN Take 1 capsule (100 mg total) by mouth 2 (two) times daily for 14 days.   ferrous sulfate 325 (65 FE) MG  EC tablet Take 1 tablet (325 mg total) by mouth 2 (two) times daily.   gabapentin 100 MG capsule Commonly known as: NEURONTIN Take 2 capsules (200 mg total) by mouth at bedtime. What changed: how much to take   glipiZIDE 10 MG 24 hr tablet Commonly known as: GLUCOTROL XL TAKE 1 TABLET BY MOUTH TWICE A DAY   hydrochlorothiazide 25 MG tablet Commonly known as: HYDRODIURIL Take 1 tablet (25 mg total) by mouth daily.   indomethacin 25 MG capsule Commonly known as: INDOCIN TAKE 1 CAPSULE BY MOUTH TWICE A DAY AS NEEDED What changed: See the new instructions.   insulin NPH Human 100 UNIT/ML injection Commonly known as: NovoLIN N ReliOn Inject 1.1 mLs (110 Units total) into the skin every morning. And syringes 1/day What changed:  how much to take when to take this   IRON PO Take 1 tablet by mouth 2 (two) times daily.   lisinopril 20 MG tablet Commonly known as: ZESTRIL TAKE 1 TABLET BY MOUTH EVERY DAY What changed: when to take this   lovastatin 40 MG tablet Commonly known as: MEVACOR TAKE 1 TABLET BY MOUTH AT BEDTIME **ANNUAL APPT DUE IN JUNE,MUST SEE PROVIDER FOR REFILLS*   meloxicam 15 MG tablet Commonly known as: MOBIC Take 1 tablet (15 mg total) by mouth daily as needed for pain.   metFORMIN 1000 MG tablet Commonly known as: GLUCOPHAGE TAKE 1 TABLET BY MOUTH TWICE A DAY WITH FOOD   metroNIDAZOLE 500 MG tablet Commonly known as: FLAGYL Take 1 tablet (500 mg total) by mouth 3 (three) times daily for 14 days.   OVER THE COUNTER MEDICATION Take 1 tablet by mouth 2 (two) times daily. Beet Root supplement   pantoprazole 40 MG tablet Commonly known as: PROTONIX Take 1 tablet (40 mg total) by mouth daily.   traMADol 50 MG tablet Commonly known as: ULTRAM TAKE 1 TABLET BY MOUTH EVERY 6 HOURS AS NEEDED        Allergies: No Known Allergies  Past Medical History:  Diagnosis Date   Chicken pox    Diabetes mellitus without complication ([place])    Essential  hypertension    GERD (gastroesophageal reflux disease)    Gouty arthritis    Hypertension    Iron deficiency anemia    Vitamin D deficiency  Past Surgical History:  Procedure Laterality Date   BIOPSY  12/05/2021   Procedure: BIOPSY;  Surgeon: Doran Stabler, MD;  Location: [facility];  Service: Gastroenterology;;   ESOPHAGOGASTRODUODENOSCOPY N/A 12/05/2021   Procedure: ESOPHAGOGASTRODUODENOSCOPY (EGD);  Surgeon: Doran Stabler, MD;  Location: [facility];  Service: Gastroenterology;  Laterality: N/A;   HOT HEMOSTASIS N/A 12/05/2021   Procedure: HOT HEMOSTASIS (ARGON PLASMA COAGULATION/BICAP);  Surgeon: Doran Stabler, MD;  Location: [facility];  Service: Gastroenterology;  Laterality: N/A;   INGUINAL HERNIA REPAIR Right 12/19/2017   Procedure: HERNIA REPAIR INGUINAL RIGHT;  Surgeon: Coralie Keens, MD;  Location: [facility];  Service: General;  Laterality: Right;   INSERTION OF MESH Right 12/19/2017   Procedure: INSERTION OF MESH;  Surgeon: Coralie Keens, MD;  Location: [facility];  Service: General;  Laterality: Right;   MASS EXCISION Right 12/19/2017   Procedure: EXCISION OF RIGHT SCROTAL MASS;  Surgeon: Coralie Keens, MD;  Location: [facility];  Service: General;  Laterality: Right;   TONSILLECTOMY      Family History  Problem Relation Age of Onset   Heart disease Mother    Hypertension Mother    Diabetes Mother    Stroke Father    Hypertension Father    Stroke Brother     Social History:  reports that he has quit smoking. His smoking use included cigarettes. He has a 50.00 pack-year smoking history. He has quit using smokeless tobacco.  His smokeless tobacco use included chew. He reports that he does not currently use alcohol. He reports that he does not use drugs.  Review of Systems:  Last diabetic eye exam date: 2021?  Last foot exam date:4/23 No symptoms of numbness in the feet   Microalbumin normal as of 4/23  Hypertension: Treated by PCP with HCTZ  and lisinopril He was supposed to cut back his HCTZ or stop it with [place] but he has not done so  BP Readings from Last 3 Encounters:  12/20/21 122/78  12/05/21 127/82  10/26/21 112/70    Lipids: Controlled with lovastatin 40 mg prescribed by Dr. Jenny Reichmann    Lab Results  Component Value Date   CHOL 123 09/21/2021   CHOL 140 03/23/2021   CHOL 136 05/18/2020   Lab Results  Component Value Date   HDL 32.70 (L) 09/21/2021   HDL 35.80 (L) 03/23/2021   HDL 38.20 (L) 05/18/2020   Lab Results  Component Value Date   LDLCALC 53 09/21/2021   LDLCALC 73 03/23/2021   LDLCALC 68 05/18/2020   Lab Results  Component Value Date   TRIG 188.0 (H) 09/21/2021   TRIG 155.0 (H) 03/23/2021   TRIG 147.0 05/18/2020   Lab Results  Component Value Date   CHOLHDL 4 09/21/2021   CHOLHDL 4 03/23/2021   CHOLHDL 4 05/18/2020   No results found for: "LDLDIRECT"   Examination:   BP 122/78   Pulse 64   Wt 235 lb 3.2 oz (106.7 kg)   SpO2 96%   BMI 36.84 kg/m   Body mass index is 36.84 kg/m.    ASSESSMENT/ PLAN:    Diabetes type 2:   Current regimen: NPH insulin twice a day, Farxiga 5 mg, glipizide and metformin  A1c is 9% but fructosamine of 261 however indicates recently improved control He has not been able to start his Dexcom on his own and difficult to assess his blood sugars because of his using the generic monitor and not keeping any record However with  adding Wilder Glade his blood sugars are significantly improving overall and he has lost 10 pounds  Recommendations:  FARXIGA: Increase the dose to 2 tablets of the 5 mg and next prescription will be 10 mg To avoid low blood pressure stop the hydrochlorothiazide blood pressure pill Make sure you are drinking plenty of water  GLIPIZIDE: Stop the glipizide in the morning and continue in the evening only  INSULIN: Reduce the dose to 30-35 units before breakfast and STOP  taking IN PM However if afternoons after dinner are high normal  consider mealtime insulin  If the blood sugar is low at the time of injection reduce dose by 5 units but do not skip Call if blood sugars are starting to get below 90 consistently  Recommended blood sugar targets were discussed 82 120 before meals and 130-180 after meals  Needs to start walking for exercise, discussed importance of weight loss for long-term management Continue improving diet as recommended by the nutritionist today Start using [place] that was started by the nurse educator today  HYPERTENSION: Well-controlled As discussed above we will stop HCTZ to avoid any decrease in blood pressure or worsening renal function with increasing Farxiga   Patient Instructions  Blood sugar targets: Before meals 80-120  After meals 130-180  FARXIGA: Increase the dose to 2 tablets of the 5 mg and next prescription will be 10 mg To avoid low blood pressure stop the hydrochlorothiazide blood pressure pill Make sure you are drinking plenty of water  GLIPIZIDE: Stop the glipizide in the morning and continue in the evening only  INSULIN: Reduce the dose to 30 OR 35 units before breakfast and STOP  taking IN PM  If the blood sugar is low at the time of injection reduce dose by 5 units but do not skip Call if blood sugars are starting to get below 90 consistently  Start walking for exercise   Elayne Snare 12/20/2021, 1:46 PM

## 2021-12-20 NOTE — Patient Instructions (Signed)
Discuss with DR. Dwyane Dee today your low blood sugars yesterday Always take PM dose of insulin.  Treat low blood sugars first, wait 15 minutes and then take PM dose of insulin and then eat supper.  Call office if blood sugars drop below 70

## 2021-12-26 ENCOUNTER — Telehealth: Payer: Self-pay

## 2021-12-26 DIAGNOSIS — E1165 Type 2 diabetes mellitus with hyperglycemia: Secondary | ICD-10-CM

## 2021-12-26 NOTE — Telephone Encounter (Signed)
Patient called in about farxiga. Per your notes patient next Rx will increase to '10mg'$ . Patient wants to know if he can keep the '5mg'$  and take 1 pill in the morning and 1 in the afternoon?

## 2021-12-28 ENCOUNTER — Encounter: Payer: 59 | Attending: Endocrinology | Admitting: Nutrition

## 2021-12-28 DIAGNOSIS — Z794 Long term (current) use of insulin: Secondary | ICD-10-CM | POA: Insufficient documentation

## 2021-12-28 DIAGNOSIS — E1165 Type 2 diabetes mellitus with hyperglycemia: Secondary | ICD-10-CM | POA: Insufficient documentation

## 2021-12-28 DIAGNOSIS — Z713 Dietary counseling and surveillance: Secondary | ICD-10-CM | POA: Insufficient documentation

## 2021-12-28 MED ORDER — DAPAGLIFLOZIN PROPANEDIOL 10 MG PO TABS: 10.0000 mg | ORAL_TABLET | Freq: Every day | ORAL | 2 refills | Status: DC

## 2021-12-28 NOTE — Addendum Note (Signed)
Addended by: Cinda [facility] on: 12/28/2021 02:46 PM   Modules accepted: Orders

## 2021-12-28 NOTE — Progress Notes (Signed)
Clarity app was downloaded to his phone at the last visit when his dexcom was started, but he could not remember is log in.  He is here today after speaking to [facility] to finish the set up.  The app was linked to the [place] app, and this was linked to [place].  He had no questions.

## 2021-12-30 ENCOUNTER — Telehealth: Payer: Self-pay | Admitting: Gastroenterology

## 2021-12-30 NOTE — Telephone Encounter (Signed)
Returned call to patient. He has completed H. Pylori treatment yesterday. Pt is aware that he will be due for a urea breath test in about 3 weeks. Pt has been advised to hold Pantoprazole at this time. Pt is aware that he will receive a reminder closer to the time he is due for urea breath test. Pt verbalized understanding and had no concerns at the end of the call.

## 2022-01-03 ENCOUNTER — Ambulatory Visit: Payer: Self-pay

## 2022-01-03 ENCOUNTER — Ambulatory Visit (INDEPENDENT_AMBULATORY_CARE_PROVIDER_SITE_OTHER): Payer: Commercial Managed Care - HMO | Admitting: Family Medicine

## 2022-01-03 ENCOUNTER — Encounter: Payer: Self-pay | Admitting: Family Medicine

## 2022-01-03 VITALS — BP 102/62 | HR 58 | Ht 67.0 in | Wt 232.0 lb

## 2022-01-03 DIAGNOSIS — M7711 Lateral epicondylitis, right elbow: Secondary | ICD-10-CM | POA: Diagnosis not present

## 2022-01-03 DIAGNOSIS — M79601 Pain in right arm: Secondary | ICD-10-CM | POA: Diagnosis not present

## 2022-01-03 NOTE — Patient Instructions (Signed)
Lateral epi exercises Brace at night Tennis elbow brace at [facility] See me in 6 weeks

## 2022-01-03 NOTE — Progress Notes (Signed)
[place] Phone: [phone] Subjective:   Steven [name], am serving as a scribe for Dr. Hulan Saas.  I'm seeing this patient by the request  of:  [name], MD  CC: Right elbow pain  [id number]  Steven [name] is a 61 y.o. male coming in with complaint of R elbow and forearm pain. Last seen in May 2022. History of a tree limp hitting his arm years ago. Patient notes being unable to flex shoulder but was able to bend elbow.     Past Medical History:  Diagnosis Date   Chicken pox    Diabetes mellitus without complication ([place])    Essential hypertension    GERD (gastroesophageal reflux disease)    Gouty arthritis    Hypertension    Iron deficiency anemia    Vitamin D deficiency    Past Surgical History:  Procedure Laterality Date   BIOPSY  12/05/2021   Procedure: BIOPSY;  Surgeon: Doran Stabler, MD;  [facility]: [facility];  Service: Gastroenterology;;   ESOPHAGOGASTRODUODENOSCOPY N/A 12/05/2021   Procedure: ESOPHAGOGASTRODUODENOSCOPY (EGD);  Surgeon: Doran Stabler, MD;  Location: [facility];  Service: Gastroenterology;  Laterality: N/A;   HOT HEMOSTASIS N/A 12/05/2021   Procedure: HOT HEMOSTASIS (ARGON PLASMA COAGULATION/BICAP);  Surgeon: Doran Stabler, MD;  Location: [facility];  Service: Gastroenterology;  Laterality: N/A;   INGUINAL HERNIA REPAIR Right 12/19/2017   Procedure: HERNIA REPAIR INGUINAL RIGHT;  Surgeon: Coralie Keens, MD;  [facility]: [facility];  Service: General;  Laterality: Right;   INSERTION OF MESH Right 12/19/2017   Procedure: INSERTION OF MESH;  Surgeon: Coralie Keens, MD;  Location: [facility];  Service: General;  Laterality: Right;   MASS EXCISION Right 12/19/2017   Procedure: EXCISION OF RIGHT SCROTAL MASS;  Surgeon: Coralie Keens, MD;  Location: [facility];  Service: General;  Laterality: Right;   TONSILLECTOMY     Social History   Socioeconomic History    Marital status: Divorced    Spouse name: Not on file   Number of children: 1   Years of education: 12   Highest education level: Not on file  Occupational History   Occupation: retired  Tobacco Use   Smoking status: Former    Packs/day: 2.00    Years: 25.00    Total pack years: 50.00    Types: Cigarettes   Smokeless tobacco: Former    Types: Nurse, children's Use: Never used  Substance and Sexual Activity   Alcohol use: Not Currently   Drug use: [name]   Sexual activity: Yes    Birth control/protection: Condom  Other Topics Concern   Not on file  Social History Narrative   Fun: Just working   Investment banker, operational of Radio broadcast assistant Strain: Not on file  Food Insecurity: Not on file  Transportation Needs: Not on file  Physical Activity: Not on file  Stress: Not on file  Social Connections: Not on file   [name] Known Allergies Family History  Problem Relation Age of Onset   Heart disease Mother    Hypertension Mother    Diabetes Mother    Stroke Father    Hypertension Father    Stroke Brother     Current Outpatient Medications (Endocrine & Metabolic):    dapagliflozin propanediol (FARXIGA) 10 MG TABS tablet, Take 1 tablet (10 mg total) by mouth daily before breakfast.   glipiZIDE (GLUCOTROL XL)  10 MG 24 hr tablet, TAKE 1 TABLET BY MOUTH TWICE A DAY   insulin NPH Human (NOVOLIN N RELION) 100 UNIT/ML injection, Inject 1.1 mLs (110 Units total) into the skin every morning. And syringes 1/day (Patient taking differently: Inject 30 Units into the skin 2 (two) times daily. And syringes 1/day)   metFORMIN (GLUCOPHAGE) 1000 MG tablet, TAKE 1 TABLET BY MOUTH TWICE A DAY WITH FOOD  Current Outpatient Medications (Cardiovascular):    hydrochlorothiazide (HYDRODIURIL) 25 MG tablet, Take 1 tablet (25 mg total) by mouth daily.   lisinopril (ZESTRIL) 20 MG tablet, TAKE 1 TABLET BY MOUTH EVERY DAY (Patient taking differently: Take 20 mg by mouth at bedtime.)    lovastatin (MEVACOR) 40 MG tablet, TAKE 1 TABLET BY MOUTH AT BEDTIME **ANNUAL APPT DUE IN JUNE,MUST SEE PROVIDER FOR REFILLS*   Current Outpatient Medications (Analgesics):    allopurinol (ZYLOPRIM) 100 MG tablet, TAKE 1 TABLET BY MOUTH TWICE A DAY   indomethacin (INDOCIN) 25 MG capsule, TAKE 1 CAPSULE BY MOUTH TWICE A DAY AS NEEDED (Patient taking differently: Take 25 mg by mouth at bedtime.)   meloxicam (MOBIC) 15 MG tablet, Take 1 tablet (15 mg total) by mouth daily as needed for pain.   traMADol (ULTRAM) 50 MG tablet, TAKE 1 TABLET BY MOUTH EVERY 6 HOURS AS NEEDED  Current Outpatient Medications (Hematological):    Ferrous Sulfate (IRON PO), Take 1 tablet by mouth 2 (two) times daily.   ferrous sulfate 325 (65 FE) MG EC tablet, Take 1 tablet (325 mg total) by mouth 2 (two) times daily.  Current Outpatient Medications (Other):    Coenzyme Q10 (COQ-10 PO), Take 1 tablet by mouth 2 (two) times daily.   Continuous Blood Gluc Sensor (DEXCOM G7 SENSOR) MISC, 1 Device by Does not apply route as directed. Change sensor every 10 days   cyclobenzaprine (FLEXERIL) 5 MG tablet, TAKE 1 TABLET BY MOUTH THREE TIMES A DAY AS NEEDED FOR MUSCLE SPASMS (Patient taking differently: Take 5 mg by mouth 2 (two) times daily as needed for muscle spasms.)   diclofenac Sodium (VOLTAREN) 1 % GEL, Apply 2 g topically 4 (four) times daily. (Patient taking differently: Apply 2 g topically 4 (four) times daily as needed (pain).)   gabapentin (NEURONTIN) 100 MG capsule, Take 2 capsules (200 mg total) by mouth at bedtime. (Patient taking differently: Take 100 mg by mouth at bedtime.)   OVER THE COUNTER MEDICATION, Take 1 tablet by mouth 2 (two) times daily. Beet Root supplement   pantoprazole (PROTONIX) 40 MG tablet, Take 1 tablet (40 mg total) by mouth daily.   Reviewed prior external information including notes and imaging from  primary care provider As well as notes that were available from care everywhere and other  healthcare systems.  Past medical history, social, surgical and family history all reviewed in electronic medical record.  [name] pertanent information unless stated regarding to the chief complaint.   Review of Systems:  [name] headache, visual changes, nausea, vomiting, diarrhea, constipation, dizziness, abdominal pain, skin rash, fevers, chills, night sweats, weight loss, swollen lymph nodes, body aches, joint swelling, chest pain, shortness of breath, mood changes. POSITIVE muscle aches  Objective  Blood pressure 102/62, pulse (!) 58, height '5\' 7"'$  (1.702 m), weight 232 lb (105.2 kg), SpO2 97 %.   General: [name] apparent distress alert and oriented x3 mood and affect normal, dressed appropriately.  HEENT: Pupils equal, extraocular movements intact  Respiratory: Patient's speak in full sentences and does not appear short of  breath  Cardiovascular: [name] lower extremity edema, non tender, [name] erythema  Right elbow exam tender to palpation over the lateral epicondylitis.  Patient does have some pain with resisted wrist extension.  Good grip strength noted.  Negative Spurling's but does have some limited sidebending of the neck noted.  Full range of motion of the shoulder.  Limited muscular skeletal ultrasound was performed and interpreted by Hulan Saas, M  Limited ultrasound of patient's common extensor tendon does not show any true tearing but does have some hypoechoic changes as well as some calcific changes showing acute on chronic tendinopathy noted.  Dilatation of the tendon noted.  [name] cortical irregularity of the lateral epicondylar region. Impression: Acute on chronic tendinitis  [id number]; 15 additional minutes spent for Therapeutic exercises as stated in above notes.  This included exercises focusing on stretching, strengthening, with significant focus on eccentric aspects.   Long term goals include an improvement in range of motion, strength, endurance as well as avoiding reinjury. Patient's frequency  would include in 1-2 times a day, 3-5 times a week for a duration of 6-12 weeks. Elbow anatomy was reviewed, and tendinopathy was explained.  Pt. given a home rehab program. Start with isometrics and ROM, then a series of concentric and eccentric exercises should be done starting with [name] weight, work up to 1 lb, hammer, etc.  Use counterforce strap if working or using hands. Emphasized stretching an cross-friction massage Emphasized proper palms up lifting biomechanics to unload ECRB  Proper technique shown and discussed handout in great detail with ATC.  All questions were discussed and answered.      Impression and Recommendations:     The above documentation has been reviewed and is accurate and complete Lyndal Pulley, DO

## 2022-01-03 NOTE — Assessment & Plan Note (Signed)
Patient does have what appears to be more lateral epicondylitis.  Does do a lot of repetitive activity.  We discussed with patient bracing, home exercises, icing regimen.  Worsening pain will consider the possibility of injections.  Follow-up again in 6 to 7 weeks

## 2022-01-09 ENCOUNTER — Telehealth: Payer: Self-pay

## 2022-01-09 NOTE — Telephone Encounter (Addendum)
-----   Message from Yevette Edwards, RN sent at 12/09/2021 11:09 AM EDT ----- Regarding: Urea Breath test Urea breath test due on or after 01/19/22. Mail reminder to patient, on Protonix 40 mg daily

## 2022-01-09 NOTE — Telephone Encounter (Signed)
Called and spoke with patient to remind him to hold Pantoprazole for 2 weeks prior to submitting Urea breath test. He will be due for breath test on or after 01/23/22. Pt is aware that I have a written order and directions to [facility] that he will need. Pt wants to pick them up from the office, he states that he is not in town this week and will likely be by next week to pick up instructions. Pt knows to pick up from the 2nd floor receptionist desk. Pt verbalized understanding and had no concerns at the end of the call.

## 2022-01-16 ENCOUNTER — Other Ambulatory Visit: Payer: Self-pay | Admitting: Internal Medicine

## 2022-01-16 DIAGNOSIS — I10 Essential (primary) hypertension: Secondary | ICD-10-CM

## 2022-01-16 NOTE — Telephone Encounter (Signed)
Please refill as per office routine med refill policy (all routine meds to be refilled for 3 mo or monthly (per pt preference) up to one year from last visit, then month to month grace period for 3 mo, then further med refills will have to be denied) ? ?

## 2022-01-25 ENCOUNTER — Other Ambulatory Visit (HOSPITAL_COMMUNITY): Payer: Self-pay

## 2022-01-26 ENCOUNTER — Other Ambulatory Visit: Payer: Self-pay | Admitting: Internal Medicine

## 2022-01-26 ENCOUNTER — Other Ambulatory Visit: Payer: Self-pay

## 2022-01-26 MED ORDER — GLIPIZIDE ER 10 MG PO TB24: 10.0000 mg | ORAL_TABLET | Freq: Two times a day (BID) | ORAL | 0 refills | Status: DC

## 2022-01-26 NOTE — Telephone Encounter (Signed)
Please refill as per office routine med refill policy (all routine meds to be refilled for 3 mo or monthly (per pt preference) up to one year from last visit, then month to month grace period for 3 mo, then further med refills will have to be denied) ? ?

## 2022-01-27 ENCOUNTER — Other Ambulatory Visit (HOSPITAL_COMMUNITY): Payer: Self-pay

## 2022-01-27 ENCOUNTER — Telehealth: Payer: Self-pay

## 2022-01-27 NOTE — Telephone Encounter (Signed)
Patient Advocate Encounter   Received notification from [employer] that prior authorization is required for [facility] G7 Sensor. PA submitted and APPROVED on 01/27/2022.  Key [id number]  Effective: 01/27/2022 - 01/27/2023  Clista Bernhardt, CPhT Rx Patient Advocate Specialist Phone: [phone]

## 2022-02-09 NOTE — Progress Notes (Unsigned)
[facility] [place] Phone: [phone] Subjective:   Steven [name], am serving as a scribe for Dr. Hulan Saas.  I'm seeing this patient by the request  of:  [name], MD  CC: Right elbow pain, right knee pain  IEP:[id number]  01/03/2022 Patient does have what appears to be more lateral epicondylitis.  Does do a lot of repetitive activity.  We discussed with patient bracing, home exercises, icing regimen.  Worsening pain will consider the possibility of injections.  Follow-up again in 6 to 7 weeks  Update 02/14/2022 Steven [name] is a 61 y.o. male coming in with complaint of R elbow pain. Patient states that he will have flares when he reaches for something. Does feel slight improvement.  Having increasing in knee pain as well.  Increasing instability.  Feels like it is more swollen.  Feels the left knee is compensating somewhat.     Past Medical History:  Diagnosis Date   Chicken pox    Diabetes mellitus without complication ([place])    Essential hypertension    GERD (gastroesophageal reflux disease)    Gouty arthritis    Hypertension    Iron deficiency anemia    Vitamin D deficiency    Past Surgical History:  Procedure Laterality Date   BIOPSY  12/05/2021   Procedure: BIOPSY;  Surgeon: Doran Stabler, MD;  Location: [facility];  Service: Gastroenterology;;   ESOPHAGOGASTRODUODENOSCOPY N/A 12/05/2021   Procedure: ESOPHAGOGASTRODUODENOSCOPY (EGD);  Surgeon: Doran Stabler, MD;  Location: [facility];  Service: Gastroenterology;  Laterality: N/A;   HOT HEMOSTASIS N/A 12/05/2021   Procedure: HOT HEMOSTASIS (ARGON PLASMA COAGULATION/BICAP);  Surgeon: Doran Stabler, MD;  Location: [facility];  Service: Gastroenterology;  Laterality: N/A;   INGUINAL HERNIA REPAIR Right 12/19/2017   Procedure: HERNIA REPAIR INGUINAL RIGHT;  Surgeon: Coralie Keens, MD;  Location: [facility];  Service: General;   Laterality: Right;   INSERTION OF MESH Right 12/19/2017   Procedure: INSERTION OF MESH;  Surgeon: Coralie Keens, MD;  Location: [facility];  Service: General;  Laterality: Right;   MASS EXCISION Right 12/19/2017   Procedure: EXCISION OF RIGHT SCROTAL MASS;  Surgeon: Coralie Keens, MD;  Location: [facility];  Service: General;  Laterality: Right;   TONSILLECTOMY     Social History   Socioeconomic History   Marital status: Divorced    Spouse name: Not on file   Number of children: 1   Years of education: 12   Highest education level: Not on file  Occupational History   Occupation: retired  Tobacco Use   Smoking status: Former    Packs/day: 2.00    Years: 25.00    Total pack years: 50.00    Types: Cigarettes   Smokeless tobacco: Former    Types: Nurse, children's Use: Never used  Substance and Sexual Activity   Alcohol use: Not Currently   Drug use: [name]   Sexual activity: Yes    Birth control/protection: Condom  Other Topics Concern   Not on file  Social History Narrative   Fun: Just working   Investment banker, operational of Radio broadcast assistant Strain: Not on file  Food Insecurity: Not on file  Transportation Needs: Not on file  Physical Activity: Not on file  Stress: Not on file  Social Connections: Not on file   [name] Known Allergies Family History  Problem Relation Age of Onset  Heart disease Mother    Hypertension Mother    Diabetes Mother    Stroke Father    Hypertension Father    Stroke Brother     Current Outpatient Medications (Endocrine & Metabolic):    dapagliflozin propanediol (FARXIGA) 10 MG TABS tablet, Take 1 tablet (10 mg total) by mouth daily before breakfast.   glipiZIDE (GLUCOTROL XL) 10 MG 24 hr tablet, TAKE 1 TABLET BY MOUTH TWICE A DAY   glipiZIDE (GLUCOTROL XL) 10 MG 24 hr tablet, Take 1 tablet (10 mg total) by mouth 2 (two) times daily.   insulin NPH Human (NOVOLIN N RELION) 100 UNIT/ML injection, Inject 1.1 mLs (110 Units total) into  the skin every morning. And syringes 1/day (Patient taking differently: Inject 30 Units into the skin 2 (two) times daily. And syringes 1/day)   metFORMIN (GLUCOPHAGE) 1000 MG tablet, TAKE 1 TABLET BY MOUTH TWICE A DAY WITH FOOD  Current Outpatient Medications (Cardiovascular):    hydrochlorothiazide (HYDRODIURIL) 25 MG tablet, Take 1 tablet (25 mg total) by mouth daily.   lisinopril (ZESTRIL) 20 MG tablet, TAKE 1 TABLET BY MOUTH EVERY DAY   lovastatin (MEVACOR) 40 MG tablet, TAKE 1 TABLET BY MOUTH AT BEDTIME **ANNUAL APPT DUE IN JUNE,MUST SEE PROVIDER FOR REFILLS*   Current Outpatient Medications (Analgesics):    allopurinol (ZYLOPRIM) 100 MG tablet, TAKE 1 TABLET BY MOUTH TWICE A DAY   indomethacin (INDOCIN) 25 MG capsule, TAKE 1 CAPSULE BY MOUTH TWICE A DAY AS NEEDED (Patient taking differently: Take 25 mg by mouth at bedtime.)   meloxicam (MOBIC) 15 MG tablet, Take 1 tablet (15 mg total) by mouth daily as needed for pain.   traMADol (ULTRAM) 50 MG tablet, TAKE 1 TABLET BY MOUTH EVERY 6 HOURS AS NEEDED  Current Outpatient Medications (Hematological):    Ferrous Sulfate (IRON PO), Take 1 tablet by mouth 2 (two) times daily.   ferrous sulfate 325 (65 FE) MG EC tablet, Take 1 tablet (325 mg total) by mouth 2 (two) times daily.  Current Outpatient Medications (Other):    Coenzyme Q10 (COQ-10 PO), Take 1 tablet by mouth 2 (two) times daily.   Continuous Blood Gluc Sensor (DEXCOM G7 SENSOR) MISC, 1 Device by Does not apply route as directed. Change sensor every 10 days   cyclobenzaprine (FLEXERIL) 5 MG tablet, TAKE 1 TABLET BY MOUTH THREE TIMES A DAY AS NEEDED FOR MUSCLE SPASMS (Patient taking differently: Take 5 mg by mouth 2 (two) times daily as needed for muscle spasms.)   diclofenac Sodium (VOLTAREN) 1 % GEL, Apply 2 g topically 4 (four) times daily. (Patient taking differently: Apply 2 g topically 4 (four) times daily as needed (pain).)   gabapentin (NEURONTIN) 100 MG capsule, Take 2  capsules (200 mg total) by mouth at bedtime. (Patient taking differently: Take 100 mg by mouth at bedtime.)   OVER THE COUNTER MEDICATION, Take 1 tablet by mouth 2 (two) times daily. Beet Root supplement   pantoprazole (PROTONIX) 40 MG tablet, Take 1 tablet (40 mg total) by mouth daily.   Reviewed prior external information including notes and imaging from  primary care provider As well as notes that were available from care everywhere and other healthcare systems.  Past medical history, social, surgical and family history all reviewed in electronic medical record.  [name] pertanent information unless stated regarding to the chief complaint.   Review of Systems:  [name] headache, visual changes, nausea, vomiting, diarrhea, constipation, dizziness, abdominal pain, skin rash, fevers, chills, night sweats, weight  loss, swollen lymph nodes,  joint swelling, chest pain, shortness of breath, mood changes. POSITIVE muscle aches, body aches  Objective  Blood pressure 112/68, pulse 71, height '5\' 7"'$  (1.702 m), weight 237 lb (107.5 kg), SpO2 98 %.   General: [name] apparent distress alert and oriented x3 mood and affect normal, dressed appropriately.  HEENT: Pupils equal, extraocular movements intact  Respiratory: Patient's speak in full sentences and does not appear short of breath  Cardiovascular: [name] lower extremity edema, non tender, [name] erythema  Antalgic Gait noted. Right knee is tender to palpation noted.  Instability with valgus and varus force.  Trace effusion noted.  Procedure: Real-time Ultrasound Guided Injection of right common tendon sheath Device: GE Logiq Q7 Ultrasound guided injection is preferred based studies that show increased duration, increased effect, greater accuracy, decreased procedural pain, increased response rate, and decreased cost with ultrasound guided versus blind injection.  Verbal informed consent obtained.  Time-out conducted.  Noted [name] overlying erythema, induration, or  other signs of local infection.  Skin prepped in a sterile fashion.  Local anesthesia: Topical Ethyl chloride.  With sterile technique and under real time ultrasound guidance: With a 21-gauge 2 inch needle injected with 1 cc 0.5% Marcaine and 1 cc of Kenalog 40 mg/mL. Completed without difficulty  Pain immediately resolved suggesting accurate placement of the medication.  Advised to call if fevers/chills, erythema, induration, drainage, or persistent bleeding.  Impression: Technically successful ultrasound guided injection.  After informed written and verbal consent, patient was seated on exam table. Right knee was prepped with alcohol swab and utilizing anterolateral approach, patient's right knee space was injected with 4:1  marcaine 0.5%: Kenalog '40mg'$ /dL. Patient tolerated the procedure well without immediate complications.   Impression and Recommendations:

## 2022-02-14 ENCOUNTER — Ambulatory Visit: Payer: Commercial Managed Care - HMO | Admitting: Family Medicine

## 2022-02-14 ENCOUNTER — Ambulatory Visit: Payer: Self-pay

## 2022-02-14 ENCOUNTER — Encounter: Payer: Self-pay | Admitting: Family Medicine

## 2022-02-14 VITALS — BP 112/68 | HR 71 | Ht 67.0 in | Wt 237.0 lb

## 2022-02-14 DIAGNOSIS — M1711 Unilateral primary osteoarthritis, right knee: Secondary | ICD-10-CM | POA: Diagnosis not present

## 2022-02-14 DIAGNOSIS — M7711 Lateral epicondylitis, right elbow: Secondary | ICD-10-CM | POA: Diagnosis not present

## 2022-02-14 DIAGNOSIS — M25521 Pain in right elbow: Secondary | ICD-10-CM

## 2022-02-14 NOTE — Assessment & Plan Note (Signed)
Patient has done remarkably well overall.  Has been nearly 15 months since we have done any injection previously in the knee.  Hopefully patient responds well again.  Could be a candidate for viscosupplementation.  Follow-up again in 6 to 8 weeks otherwise.

## 2022-02-14 NOTE — Assessment & Plan Note (Signed)
Injection given today, tolerated procedure well.  The patient needed to have the injection with patient unfortunately having this starting to get in the way of his likelihood.  Hopefully patient responds well to this.  We discussed for the next 48 hours to monitor.  Patient will monitor blood sugars little closer and does have a sensor.  Follow-up with me again in 6 to 8 weeks otherwise.

## 2022-02-14 NOTE — Patient Instructions (Addendum)
See me in 6-8 weeks Injected knee and L elbow today

## 2022-02-17 ENCOUNTER — Other Ambulatory Visit: Payer: Self-pay | Admitting: Endocrinology

## 2022-02-20 ENCOUNTER — Other Ambulatory Visit (INDEPENDENT_AMBULATORY_CARE_PROVIDER_SITE_OTHER): Payer: Commercial Managed Care - HMO

## 2022-02-20 DIAGNOSIS — E1165 Type 2 diabetes mellitus with hyperglycemia: Secondary | ICD-10-CM | POA: Diagnosis not present

## 2022-02-20 LAB — BASIC METABOLIC PANEL
BUN: 19 mg/dL (ref 6–23)
CO2: 24 mEq/L (ref 19–32)
Calcium: 9.3 mg/dL (ref 8.4–10.5)
Chloride: 102 mEq/L (ref 96–112)
Creatinine, Ser: 1.01 mg/dL (ref 0.40–1.50)
GFR: 79.08 mL/min (ref >60.00)
Glucose, Bld: 183 mg/dL — ABNORMAL HIGH (ref 70–99)
Potassium: 4.1 mEq/L (ref 3.5–5.1)
Sodium: 136 mEq/L (ref 135–145)

## 2022-02-21 ENCOUNTER — Telehealth: Payer: Self-pay

## 2022-02-21 ENCOUNTER — Other Ambulatory Visit (HOSPITAL_COMMUNITY): Payer: Self-pay

## 2022-02-21 ENCOUNTER — Other Ambulatory Visit: Payer: Self-pay | Admitting: Endocrinology

## 2022-02-21 DIAGNOSIS — E1165 Type 2 diabetes mellitus with hyperglycemia: Secondary | ICD-10-CM

## 2022-02-21 LAB — HEMOGLOBIN A1C: Hgb A1c MFr Bld: 7.7 % — ABNORMAL HIGH (ref 4.6–6.5)

## 2022-02-21 MED ORDER — INSULIN NPH (HUMAN) (ISOPHANE) 100 UNIT/ML ~~LOC~~ SUSP: SUBCUTANEOUS | 3 refills | Status: DC

## 2022-02-21 NOTE — Telephone Encounter (Signed)
Pharmacy Patient Advocate Encounter  Insurance verification completed.    Ran test claims for the following: Novolin N.  27 day supply: $15.00 90 day supply: $45.00

## 2022-02-21 NOTE — Telephone Encounter (Signed)
Patient called in states that insurance says a PA is needed for the Novolin N. Says even with the PA he will still have a $50 copay that he can't pay. Says there is a insulin that is cheaper that insurance will cover but not sure what it is. PA team are you all able to find out?

## 2022-02-21 NOTE — Addendum Note (Signed)
Addended by: Cinda Quest on: 02/21/2022 04:41 PM   Modules accepted: Orders

## 2022-02-23 ENCOUNTER — Ambulatory Visit: Payer: Commercial Managed Care - HMO | Admitting: Endocrinology

## 2022-02-23 ENCOUNTER — Encounter: Payer: Self-pay | Admitting: Endocrinology

## 2022-02-23 VITALS — BP 106/82 | HR 68 | Ht 68.0 in | Wt 233.8 lb

## 2022-02-23 DIAGNOSIS — E1165 Type 2 diabetes mellitus with hyperglycemia: Secondary | ICD-10-CM | POA: Diagnosis not present

## 2022-02-23 DIAGNOSIS — I10 Essential (primary) hypertension: Secondary | ICD-10-CM

## 2022-02-23 DIAGNOSIS — Z794 Long term (current) use of insulin: Secondary | ICD-10-CM | POA: Diagnosis not present

## 2022-02-23 MED ORDER — INSULIN PEN NEEDLE 31G X 5 MM MISC: 1 refills | Status: DC

## 2022-02-23 MED ORDER — DEXCOM G7 SENSOR MISC: 1.0000 | 3 refills | Status: DC

## 2022-02-23 MED ORDER — HUMALOG KWIKPEN 100 UNIT/ML ~~LOC~~ SOPN: 10.0000 [IU] | PEN_INJECTOR | Freq: Three times a day (TID) | SUBCUTANEOUS | 1 refills | Status: DC

## 2022-02-23 NOTE — Patient Instructions (Addendum)
Take 30 N in am and 40 at bedtime  Stop HCTZ

## 2022-02-23 NOTE — Progress Notes (Signed)
Patient ID: Steven [name], male   DOB: June 17, 1960, 61 y.o.   MRN: [id number]           Reason for Appointment: Type II Diabetes follow-up   History of Present Illness   Diagnosis date: 2010  Previous history:     Previously on metformin and started on insulin in 10/21 He has only been on NPH A1c range in the last few years: 8.1-12.1  Recent history:     Non-insulin hypoglycemic drugs: Farxiga 10 mg daily,   metformin 1 g twice daily:      Insulin regimen:   35-40 NPH, am and 40-50 hs     Side effects from medications: None  Current self management, blood sugar patterns and problems identified: 9/23  A1c is 7.7 compared to 9 He was started on the Dexcom sensor after his last visit but he has not used it since last month because he was waiting for prior authorization which has been done He is not understanding the action of NPH insulin and is taking the doses based on his blood sugar at the time of injection He thinks his blood sugars are relatively higher after dinner However he may feel his sugar relatively low during the day but does not check to confirm Lab glucose was 183 after breakfast Also reviewing his Dexcom data on the clarity app on his phone indicates that his sugars were significantly higher after breakfast, low normal before dinner and mildly increased after dinner He usually is eating leftovers for breakfast and not excessive carbohydrate He has been told to cut back on regular soft drinks He has seen the dietitian periodically in the past but not recently, was seen by diabetes educator in June and July   Exercise: none  Diet management: Dinner 7 pm      Monitors blood glucose: Once or twice a day.    Glucometer: [employer] brand          Blood Glucose readings from recall:    PRE-MEAL Fasting Lunch Dinner Bedtime Overall  Glucose range: 100+      Mean/median:        POST-MEAL PC Breakfast PC Lunch PC Dinner  Glucose range:   200  Mean/median:        Dietician visit: Most recent: 03/2020    Weight control:  Wt Readings from Last 3 Encounters:  02/23/22 233 lb 12.8 oz (106.1 kg)  02/14/22 237 lb (107.5 kg)  01/03/22 232 lb (105.2 kg)            Diabetes labs:  Lab Results  Component Value Date   HGBA1C 7.7 (H) 02/20/2022   HGBA1C 9.0 (H) 12/15/2021   HGBA1C 8.5 (A) 08/24/2021   Lab Results  Component Value Date   MICROALBUR <0.7 09/21/2021   LDLCALC 53 09/21/2021   CREATININE 1.01 02/20/2022     Allergies as of 02/23/2022   No Known Allergies      Medication List        Accurate as of February 23, 2022  1:55 PM. If you have any questions, ask your nurse or doctor.          STOP taking these medications    glipiZIDE 10 MG 24 hr tablet Commonly known as: GLUCOTROL XL Stopped by: Elayne Snare, MD   hydrochlorothiazide 25 MG tablet Commonly known as: HYDRODIURIL Stopped by: Elayne Snare, MD       TAKE these medications    allopurinol 100 MG tablet Commonly known as: ZYLOPRIM TAKE  1 TABLET BY MOUTH TWICE A DAY   COQ-10 PO Take 1 tablet by mouth 2 (two) times daily.   cyclobenzaprine 5 MG tablet Commonly known as: FLEXERIL TAKE 1 TABLET BY MOUTH THREE TIMES A DAY AS NEEDED FOR MUSCLE SPASMS What changed: See the new instructions.   dapagliflozin propanediol 10 MG Tabs tablet Commonly known as: Farxiga Take 1 tablet (10 mg total) by mouth daily before breakfast.   Dexcom G7 Sensor Misc 1 Device by Does not apply route as directed. Change sensor every 10 days   diclofenac Sodium 1 % Gel Commonly known as: VOLTAREN Apply 2 g topically 4 (four) times daily. What changed:  when to take this reasons to take this   ferrous sulfate 325 (65 FE) MG EC tablet Take 1 tablet (325 mg total) by mouth 2 (two) times daily.   gabapentin 100 MG capsule Commonly known as: NEURONTIN Take 2 capsules (200 mg total) by mouth at bedtime. What changed: how much to take   HumaLOG KwikPen 100 UNIT/ML  KwikPen Generic drug: insulin lispro Inject 10 Units into the skin 3 (three) times daily. Started by: Elayne Snare, MD   indomethacin 25 MG capsule Commonly known as: INDOCIN TAKE 1 CAPSULE BY MOUTH TWICE A DAY AS NEEDED What changed: See the new instructions.   insulin NPH Human 100 UNIT/ML injection Commonly known as: NovoLIN N ReliOn Inject up to 35 units max daily   Insulin Pen Needle 31G X 5 MM Misc Use bid Started by: Elayne Snare, MD   IRON PO Take 1 tablet by mouth 2 (two) times daily.   lisinopril 20 MG tablet Commonly known as: ZESTRIL TAKE 1 TABLET BY MOUTH EVERY DAY   lovastatin 40 MG tablet Commonly known as: MEVACOR TAKE 1 TABLET BY MOUTH AT BEDTIME **ANNUAL APPT DUE IN JUNE,MUST SEE PROVIDER FOR REFILLS*   meloxicam 15 MG tablet Commonly known as: MOBIC Take 1 tablet (15 mg total) by mouth daily as needed for pain.   metFORMIN 1000 MG tablet Commonly known as: GLUCOPHAGE TAKE 1 TABLET BY MOUTH TWICE A DAY WITH FOOD   OVER THE COUNTER MEDICATION Take 1 tablet by mouth 2 (two) times daily. Beet Root supplement   pantoprazole 40 MG tablet Commonly known as: PROTONIX Take 1 tablet (40 mg total) by mouth daily.   traMADol 50 MG tablet Commonly known as: ULTRAM TAKE 1 TABLET BY MOUTH EVERY 6 HOURS AS NEEDED        Allergies: No Known Allergies  Past Medical History:  Diagnosis Date   Chicken pox    Diabetes mellitus without complication ([place])    Essential hypertension    GERD (gastroesophageal reflux disease)    Gouty arthritis    Hypertension    Iron deficiency anemia    Vitamin D deficiency     Past Surgical History:  Procedure Laterality Date   BIOPSY  12/05/2021   Procedure: BIOPSY;  Surgeon: Doran Stabler, MD;  Location: [facility];  Service: Gastroenterology;;   ESOPHAGOGASTRODUODENOSCOPY N/A 12/05/2021   Procedure: ESOPHAGOGASTRODUODENOSCOPY (EGD);  Surgeon: Doran Stabler, MD;  Location: [facility];  Service:  Gastroenterology;  Laterality: N/A;   HOT HEMOSTASIS N/A 12/05/2021   Procedure: HOT HEMOSTASIS (ARGON PLASMA COAGULATION/BICAP);  Surgeon: Doran Stabler, MD;  Location: [facility];  Service: Gastroenterology;  Laterality: N/A;   INGUINAL HERNIA REPAIR Right 12/19/2017   Procedure: HERNIA REPAIR INGUINAL RIGHT;  Surgeon: Coralie Keens, MD;  Location: [facility];  Service: General;  Laterality: Right;   INSERTION OF MESH Right 12/19/2017   Procedure: INSERTION OF MESH;  Surgeon: Coralie Keens, MD;  Location: [facility];  Service: General;  Laterality: Right;   MASS EXCISION Right 12/19/2017   Procedure: EXCISION OF RIGHT SCROTAL MASS;  Surgeon: Coralie Keens, MD;  Location: [facility];  Service: General;  Laterality: Right;   TONSILLECTOMY      Family History  Problem Relation Age of Onset   Heart disease Mother    Hypertension Mother    Diabetes Mother    Stroke Father    Hypertension Father    Stroke Brother     Social History:  reports that he has quit smoking. His smoking use included cigarettes. He has a 50.00 pack-year smoking history. He has quit using smokeless tobacco.  His smokeless tobacco use included chew. He reports that he does not currently use alcohol. He reports that he does not use drugs.  Review of Systems:  Last diabetic eye exam date: 2021?  Last foot exam date:4/23 No symptoms of numbness in the feet   Microalbumin normal as of 4/23  Hypertension: Treated by PCP with HCTZ and lisinopril He was supposed to stop HCTZ   BP Readings from Last 3 Encounters:  02/23/22 106/82  02/14/22 112/68  01/03/22 102/62    Lipids: Controlled with lovastatin 40 mg prescribed by Dr. Jenny Reichmann    Lab Results  Component Value Date   CHOL 123 09/21/2021   CHOL 140 03/23/2021   CHOL 136 05/18/2020   Lab Results  Component Value Date   HDL 32.70 (L) 09/21/2021   HDL 35.80 (L) 03/23/2021   HDL 38.20 (L) 05/18/2020   Lab Results  Component Value Date   LDLCALC 53  09/21/2021   LDLCALC 73 03/23/2021   LDLCALC 68 05/18/2020   Lab Results  Component Value Date   TRIG 188.0 (H) 09/21/2021   TRIG 155.0 (H) 03/23/2021   TRIG 147.0 05/18/2020   Lab Results  Component Value Date   CHOLHDL 4 09/21/2021   CHOLHDL 4 03/23/2021   CHOLHDL 4 05/18/2020   No results found for: "LDLDIRECT"   Examination:   BP 106/82   Pulse 68   Ht '5\' 8"'$  (1.727 m)   Wt 233 lb 12.8 oz (106.1 kg)   SpO2 98%   BMI 35.55 kg/m   Body mass index is 35.55 kg/m.    ASSESSMENT/ PLAN:    Diabetes type 2:   Current regimen: NPH insulin twice a day, Farxiga 10 mg, and metformin  A1c is improved at 7.7  As discussed above his blood sugars are still not evenly controlled and he has poor understanding on how to adjust insulin and what the differences between basal and bolus insulin as well as the need to cover mealtime hyperglycemia with rapid acting insulin instead of adjusting NPH every day  Recommendations:   INSULIN: Today discussed in detail the need for mealtime insulin to cover postprandial spikes, action of mealtime insulin, use of the insulin pen, timing and action of the rapid acting insulin as well as starting dose and dosage titration to target the two-hour reading of under 180  Since maestro insulin is covered by his insurance he will start this He will take 10 units at breakfast and 8 to 10 units before dinner Discussed the importance of taking this before eating Since he will be restarting Dexcom sensor he should be able to monitor the readings after meals Discussed that if his sugars are consistently over  180 at breakfast he will go up to 12 units or more However he will need to follow-up with diabetes educator to review his progress and further fine-tune the doses as well as review his meal planning and daily management  To avoid hypoglycemia during the day will reduce the dose NPH dose to 30 units only and not adjusted based on [place] blood sugar He can  continue taking 40 units and not more at night but this may need to be adjusted based on his morning readings 30-35 units before breakfast and STOP  taking IN PM  Discussed need to be regularly walking for exercise  HYPERTENSION: Blood pressure is again low normal and he need to stop HCTZ, discussed that he is benefiting from [place]   Patient Instructions  Take 30 N in am and 40 at bedtime  Stop HCTZ   Elayne Snare 02/23/2022, 1:55 PM

## 2022-02-27 ENCOUNTER — Other Ambulatory Visit (HOSPITAL_COMMUNITY): Payer: Self-pay

## 2022-02-27 MED ORDER — HUMULIN N 100 UNIT/ML ~~LOC~~ SUSP: SUBCUTANEOUS | 11 refills | Status: DC

## 2022-02-27 NOTE — Addendum Note (Signed)
Addended by: Cinda Quest on: 02/27/2022 01:45 PM   Modules accepted: Orders

## 2022-02-27 NOTE — Telephone Encounter (Signed)
Correction to previous information. Per Test claim, Humulin N is covered, vs Novolin N. Vials and pens, either one, are $45 for a 3 month supply.

## 2022-02-27 NOTE — Telephone Encounter (Signed)
Is it ok to send in Humulin N?

## 2022-03-09 NOTE — Telephone Encounter (Signed)
Patient called with questions regarding insulin coverage. Informed the patient that Humulin is covered by insurance with the stated copay.

## 2022-03-12 ENCOUNTER — Other Ambulatory Visit: Payer: Self-pay | Admitting: Internal Medicine

## 2022-03-12 ENCOUNTER — Other Ambulatory Visit: Payer: Self-pay | Admitting: Endocrinology

## 2022-03-12 DIAGNOSIS — E1165 Type 2 diabetes mellitus with hyperglycemia: Secondary | ICD-10-CM

## 2022-03-13 ENCOUNTER — Other Ambulatory Visit: Payer: Self-pay

## 2022-03-13 DIAGNOSIS — E1165 Type 2 diabetes mellitus with hyperglycemia: Secondary | ICD-10-CM

## 2022-03-13 MED ORDER — HUMULIN N 100 UNIT/ML ~~LOC~~ SUSP: SUBCUTANEOUS | 3 refills | Status: DC

## 2022-03-13 NOTE — Telephone Encounter (Signed)
Please refill as per office routine med refill policy (all routine meds to be refilled for 3 mo or monthly (per pt preference) up to one year from last visit, then month to month grace period for 3 mo, then further med refills will have to be denied) ? ?

## 2022-03-15 ENCOUNTER — Other Ambulatory Visit: Payer: Self-pay | Admitting: Internal Medicine

## 2022-03-23 NOTE — Progress Notes (Signed)
[place] Phone: [phone] Subjective:   [name], am serving as a scribe for Dr. Hulan Saas.  I'm seeing this patient by the request  of:  [name], MD  CC: Right knee  URK:[id number]  02/14/2022 Patient has done remarkably well overall.  Has been nearly 15 months since we have done any injection previously in the knee.  Hopefully patient responds well again.  Could be a candidate for viscosupplementation.  Follow-up again in 6 to 8 weeks otherwise.  Injection given today, tolerated procedure well.  The patient needed to have the injection with patient unfortunately having this starting to get in the way of his likelihood.  Hopefully patient responds well to this.  We discussed for the next 48 hours to monitor.  Patient will monitor blood sugars little closer and does have a sensor.  Follow-up with me again in 6 to 8 weeks otherwise.  Update 03/27/2022 Paymon Rosensteel is a 61 y.o. male coming in with complaint of R knee and R elbow pain. Patient states that he had increase in pain since last visit. Injection did not help. Pain over anterior aspect. Wearing brace today.   Elbow pain has subsided.      Past Medical History:  Diagnosis Date   Chicken pox    Diabetes mellitus without complication ([place])    Essential hypertension    GERD (gastroesophageal reflux disease)    Gouty arthritis    Hypertension    Iron deficiency anemia    Vitamin D deficiency    Past Surgical History:  Procedure Laterality Date   BIOPSY  12/05/2021   Procedure: BIOPSY;  Surgeon: Doran Stabler, MD;  Location: [facility];  Service: Gastroenterology;;   ESOPHAGOGASTRODUODENOSCOPY N/A 12/05/2021   Procedure: ESOPHAGOGASTRODUODENOSCOPY (EGD);  Surgeon: Doran Stabler, MD;  Location: [facility];  Service: Gastroenterology;  Laterality: N/A;   HOT HEMOSTASIS N/A 12/05/2021   Procedure: HOT HEMOSTASIS (ARGON  PLASMA COAGULATION/BICAP);  Surgeon: Doran Stabler, MD;  Location: [facility];  Service: Gastroenterology;  Laterality: N/A;   INGUINAL HERNIA REPAIR Right 12/19/2017   Procedure: HERNIA REPAIR INGUINAL RIGHT;  Surgeon: Coralie Keens, MD;  Location: [facility];  Service: General;  Laterality: Right;   INSERTION OF MESH Right 12/19/2017   Procedure: INSERTION OF MESH;  Surgeon: Coralie Keens, MD;  Location: [facility];  Service: General;  Laterality: Right;   MASS EXCISION Right 12/19/2017   Procedure: EXCISION OF RIGHT SCROTAL MASS;  Surgeon: Coralie Keens, MD;  Location: [facility];  Service: General;  Laterality: Right;   TONSILLECTOMY     Social History   Socioeconomic History   Marital status: Divorced    Spouse name: Not on file   Number of children: 1   Years of education: 12   Highest education level: Not on file  Occupational History   Occupation: retired  Tobacco Use   Smoking status: Former    Packs/day: 2.00    Years: 25.00    Total pack years: 50.00    Types: Cigarettes   Smokeless tobacco: Former    Types: Nurse, children's Use: Never used  Substance and Sexual Activity   Alcohol use: Not Currently   Drug use: No   Sexual activity: Yes    Birth control/protection: Condom  Other Topics Concern   Not on file  Social History Narrative   Fun: Just working   Social  Determinants of Health   Financial Resource Strain: Not on file  Food Insecurity: Not on file  Transportation Needs: Not on file  Physical Activity: Not on file  Stress: Not on file  Social Connections: Not on file   No Known Allergies Family History  Problem Relation Age of Onset   Heart disease Mother    Hypertension Mother    Diabetes Mother    Stroke Father    Hypertension Father    Stroke Brother     Current Outpatient Medications (Endocrine & Metabolic):    FARXIGA 10 MG TABS tablet, TAKE 1 TABLET BY MOUTH DAILY BEFORE BREAKFAST.   glipiZIDE (GLUCOTROL XL) 10 MG 24 hr  tablet, Take 10 mg by mouth 2 (two) times daily.   HUMULIN N 100 UNIT/ML injection, Inject up to 30 units in morning and 40 units at bedtime   insulin lispro (HUMALOG KWIKPEN) 100 UNIT/ML KwikPen, Inject 10 Units into the skin 3 (three) times daily.   metFORMIN (GLUCOPHAGE) 1000 MG tablet, TAKE 1 TABLET BY MOUTH TWICE A DAY WITH FOOD  Current Outpatient Medications (Cardiovascular):    hydrochlorothiazide (HYDRODIURIL) 25 MG tablet, Take 25 mg by mouth daily.   lisinopril (ZESTRIL) 10 MG tablet, Take 1 tablet (10 mg total) by mouth daily.   lovastatin (MEVACOR) 40 MG tablet, Take 1 tablet (40 mg total) by mouth at bedtime.   Current Outpatient Medications (Analgesics):    allopurinol (ZYLOPRIM) 100 MG tablet, TAKE 1 TABLET BY MOUTH TWICE A DAY   indomethacin (INDOCIN) 25 MG capsule, TAKE 1 CAPSULE BY MOUTH TWICE A DAY AS NEEDED (Patient taking differently: Take 25 mg by mouth at bedtime.)   meloxicam (MOBIC) 15 MG tablet, Take 1 tablet (15 mg total) by mouth daily as needed for pain.   traMADol (ULTRAM) 50 MG tablet, TAKE 1 TABLET BY MOUTH EVERY 6 HOURS AS NEEDED  Current Outpatient Medications (Hematological):    Ferrous Sulfate (IRON PO), Take 1 tablet by mouth 2 (two) times daily.   ferrous sulfate 325 (65 FE) MG EC tablet, Take 1 tablet (325 mg total) by mouth 2 (two) times daily.  Current Outpatient Medications (Other):    Coenzyme Q10 (COQ-10 PO), Take 1 tablet by mouth 2 (two) times daily.   Continuous Blood Gluc Sensor (DEXCOM G7 SENSOR) MISC, 1 Device by Does not apply route as directed. Change sensor every 10 days   cyclobenzaprine (FLEXERIL) 5 MG tablet, TAKE 1 TABLET BY MOUTH THREE TIMES A DAY AS NEEDED FOR MUSCLE SPASMS (Patient taking differently: Take 5 mg by mouth 2 (two) times daily as needed for muscle spasms.)   diclofenac Sodium (VOLTAREN) 1 % GEL, Apply 2 g topically 4 (four) times daily. (Patient taking differently: Apply 2 g topically 4 (four) times daily as needed  (pain).)   doxycycline (VIBRA-TABS) 100 MG tablet, Take 1 tablet (100 mg total) by mouth 2 (two) times daily.   gabapentin (NEURONTIN) 100 MG capsule, Take 2 capsules (200 mg total) by mouth at bedtime. (Patient taking differently: Take 100 mg by mouth at bedtime.)   Insulin Pen Needle 31G X 5 MM MISC, Use bid   OVER THE COUNTER MEDICATION, Take 1 tablet by mouth 2 (two) times daily. Beet Root supplement   pantoprazole (PROTONIX) 40 MG tablet, Take 1 tablet (40 mg total) by mouth daily.   solifenacin (VESICARE) 5 MG tablet, Take 1 tablet (5 mg total) by mouth daily.   Reviewed prior external information including notes and imaging from  primary  care provider As well as notes that were available from care everywhere and other healthcare systems.  Past medical history, social, surgical and family history all reviewed in electronic medical record.  No pertanent information unless stated regarding to the chief complaint.   Review of Systems:  No headache, visual changes, nausea, vomiting, diarrhea, constipation, dizziness, abdominal pain, skin rash, fevers, chills, night sweats, weight loss, swollen lymph nodes, body aches, joint swelling, chest pain, shortness of breath, mood changes. POSITIVE muscle aches  Objective  Blood pressure 106/66, pulse 79, height '5\' 8"'$  (1.727 m), weight 235 lb (106.6 kg), SpO2 97 %.   General: No apparent distress alert and oriented x3 mood and affect normal, dressed appropriately.  HEENT: Pupils equal, extraocular movements intact  Respiratory: Patient's speak in full sentences and does not appear short of breath  Cardiovascular: Trace lower extremity edema, non tender, no erythema  Visions the right knee does have some crepitus noted as well.  Does have trace effusion noted.  Does have some instability with valgus and varus force.  After informed written and verbal consent, patient was seated on exam table. Right knee was prepped with alcohol swab and utilizing  anterolateral approach, patient's right knee space was injected with 2 cc of 0.5% Marc[place] and injected with 1 cc of Kenalog 30 mg/mL. Patient tolerated the procedure well without immediate complications.    Impression and Recommendations:     The above documentation has been reviewed and is accurate and complete Lyndal Pulley, DO

## 2022-03-24 ENCOUNTER — Ambulatory Visit (INDEPENDENT_AMBULATORY_CARE_PROVIDER_SITE_OTHER): Payer: Commercial Managed Care - HMO | Admitting: Internal Medicine

## 2022-03-24 ENCOUNTER — Encounter: Payer: Self-pay | Admitting: Internal Medicine

## 2022-03-24 VITALS — BP 116/60 | HR 68 | Temp 98.2°F | Ht 68.0 in | Wt 234.0 lb

## 2022-03-24 DIAGNOSIS — N3281 Overactive bladder: Secondary | ICD-10-CM | POA: Diagnosis not present

## 2022-03-24 DIAGNOSIS — D509 Iron deficiency anemia, unspecified: Secondary | ICD-10-CM

## 2022-03-24 DIAGNOSIS — E559 Vitamin D deficiency, unspecified: Secondary | ICD-10-CM

## 2022-03-24 DIAGNOSIS — Z23 Encounter for immunization: Secondary | ICD-10-CM | POA: Diagnosis not present

## 2022-03-24 DIAGNOSIS — E7800 Pure hypercholesterolemia, unspecified: Secondary | ICD-10-CM

## 2022-03-24 DIAGNOSIS — E1142 Type 2 diabetes mellitus with diabetic polyneuropathy: Secondary | ICD-10-CM | POA: Diagnosis not present

## 2022-03-24 DIAGNOSIS — N452 Orchitis: Secondary | ICD-10-CM | POA: Diagnosis not present

## 2022-03-24 DIAGNOSIS — I10 Essential (primary) hypertension: Secondary | ICD-10-CM

## 2022-03-24 LAB — CBC WITH DIFFERENTIAL/PLATELET
Basophils Absolute: 0 10*3/uL (ref 0.0–0.1)
Basophils Relative: 0.5 % (ref 0.0–3.0)
Eosinophils Absolute: 0.2 10*3/uL (ref 0.0–0.7)
Eosinophils Relative: 2.1 % (ref 0.0–5.0)
HCT: 41.7 % (ref 39.0–52.0)
Hemoglobin: 13.1 g/dL (ref 13.0–17.0)
Lymphocytes Relative: 35.4 % (ref 12.0–46.0)
Lymphs Abs: 2.6 10*3/uL (ref 0.7–4.0)
MCHC: 31.5 g/dL (ref 30.0–36.0)
MCV: 78.9 fl (ref 78.0–100.0)
Monocytes Absolute: 0.5 10*3/uL (ref 0.1–1.0)
Monocytes Relative: 7.4 % (ref 3.0–12.0)
Neutro Abs: 4 10*3/uL (ref 1.4–7.7)
Neutrophils Relative %: 54.6 % (ref 43.0–77.0)
Platelets: 350 10*3/uL (ref 150.0–400.0)
RBC: 5.28 Mil/uL (ref 4.22–5.81)
RDW: 17.5 % — ABNORMAL HIGH (ref 11.5–15.5)
WBC: 7.3 10*3/uL (ref 4.0–10.5)

## 2022-03-24 LAB — HEPATIC FUNCTION PANEL
ALT: 33 U/L (ref 0–53)
AST: 20 U/L (ref 0–37)
Albumin: 4.5 g/dL (ref 3.5–5.2)
Alkaline Phosphatase: 68 U/L (ref 39–117)
Bilirubin, Direct: 0.1 mg/dL (ref 0.0–0.3)
Total Bilirubin: 0.3 mg/dL (ref 0.2–1.2)
Total Protein: 7.5 g/dL (ref 6.0–8.3)

## 2022-03-24 LAB — BASIC METABOLIC PANEL
BUN: 21 mg/dL (ref 6–23)
CO2: 25 mEq/L (ref 19–32)
Calcium: 9.9 mg/dL (ref 8.4–10.5)
Chloride: 99 mEq/L (ref 96–112)
Creatinine, Ser: 1.22 mg/dL (ref 0.40–1.50)
GFR: 63 mL/min (ref >60.00)
Glucose, Bld: 112 mg/dL — ABNORMAL HIGH (ref 70–99)
Potassium: 4.6 mEq/L (ref 3.5–5.1)
Sodium: 131 mEq/L — ABNORMAL LOW (ref 135–145)

## 2022-03-24 LAB — IBC PANEL
Iron: 61 ug/dL (ref 42–165)
Saturation Ratios: 16.1 % — ABNORMAL LOW (ref 20.0–50.0)
TIBC: 378 ug/dL (ref 250.0–450.0)
Transferrin: 270 mg/dL (ref 212.0–360.0)

## 2022-03-24 LAB — FERRITIN: Ferritin: 108 ng/mL (ref 22.0–322.0)

## 2022-03-24 LAB — LIPID PANEL
Cholesterol: 133 mg/dL (ref 0–200)
HDL: 34 mg/dL — ABNORMAL LOW (ref >39.00)
NonHDL: 98.89
Total CHOL/HDL Ratio: 4
Triglycerides: 239 mg/dL — ABNORMAL HIGH (ref 0.0–149.0)
VLDL: 47.8 mg/dL — ABNORMAL HIGH (ref 0.0–40.0)

## 2022-03-24 LAB — VITAMIN D 25 HYDROXY (VIT D DEFICIENCY, FRACTURES): VITD: 18.06 ng/mL — ABNORMAL LOW (ref 30.00–100.00)

## 2022-03-24 LAB — HEMOGLOBIN A1C: Hgb A1c MFr Bld: 8 % — ABNORMAL HIGH (ref 4.6–6.5)

## 2022-03-24 LAB — LDL CHOLESTEROL, DIRECT: Direct LDL: 78 mg/dL

## 2022-03-24 MED ORDER — SOLIFENACIN SUCCINATE 5 MG PO TABS: 5.0000 mg | ORAL_TABLET | Freq: Every day | ORAL | 3 refills | Status: DC

## 2022-03-24 MED ORDER — LISINOPRIL 10 MG PO TABS: 10.0000 mg | ORAL_TABLET | Freq: Every day | ORAL | 3 refills | Status: DC

## 2022-03-24 MED ORDER — DOXYCYCLINE HYCLATE 100 MG PO TABS: 100.0000 mg | ORAL_TABLET | Freq: Two times a day (BID) | ORAL | 0 refills | Status: DC

## 2022-03-24 NOTE — Patient Instructions (Addendum)
You had the flu shot today  Ok to decrease the lisinopril to 10 mg per day  Please take all new medication as prescribed  - the Vesicare for the overactive bladder, and the antibiotic  Please continue all other medications as before, including the iron pills unless we call to let you know to stop  Please have the pharmacy call with any other refills you may need.  Please continue your efforts at being more active, low cholesterol diet, and weight control.  You are otherwise up to date with prevention measures today.  Please keep your appointments with your specialists as you may have planned  Please go to the LAB at the blood drawing area for the tests to be done  You will be contacted by phone if any changes need to be made immediately.  Otherwise, you will receive a letter about your results with an explanation, but please check with MyChart first.  Please remember to sign up for MyChart if you have not done so, as this will be important to you in the future with finding out test results, communicating by private email, and scheduling acute appointments online when needed.  Please make an Appointment to return in 6 months, or sooner if needed, also with Lab Appointment for testing done 3-5 days before at the [facility] (so this is for TWO appointments - please see the scheduling desk as you leave)

## 2022-03-24 NOTE — Progress Notes (Unsigned)
Patient ID: Steven [name], male   DOB: Oct 14, 1960, 61 y.o.   MRN: [id number]        Chief Complaint: follow up HTN, HLD and hyperglycemia ***       HPI:  Steven [name] is a 61 y.o. male here with c/o         Due for Tdap and Flu shots today Wt Readings from Last 3 Encounters:  03/24/22 234 lb (106.1 kg)  02/23/22 233 lb 12.8 oz (106.1 kg)  02/14/22 237 lb (107.5 kg)   BP Readings from Last 3 Encounters:  03/24/22 116/60  02/23/22 106/82  02/14/22 112/68         Past Medical History:  Diagnosis Date   Chicken pox    Diabetes mellitus without complication ([place])    Essential hypertension    GERD (gastroesophageal reflux disease)    Gouty arthritis    Hypertension    Iron deficiency anemia    Vitamin D deficiency    Past Surgical History:  Procedure Laterality Date   BIOPSY  12/05/2021   Procedure: BIOPSY;  Surgeon: Doran Stabler, MD;  [facility]: [facility];  Service: Gastroenterology;;   ESOPHAGOGASTRODUODENOSCOPY N/A 12/05/2021   Procedure: ESOPHAGOGASTRODUODENOSCOPY (EGD);  Surgeon: Doran Stabler, MD;  Location: [facility];  Service: Gastroenterology;  Laterality: N/A;   HOT HEMOSTASIS N/A 12/05/2021   Procedure: HOT HEMOSTASIS (ARGON PLASMA COAGULATION/BICAP);  Surgeon: Doran Stabler, MD;  Location: [facility];  Service: Gastroenterology;  Laterality: N/A;   INGUINAL HERNIA REPAIR Right 12/19/2017   Procedure: HERNIA REPAIR INGUINAL RIGHT;  Surgeon: Coralie Keens, MD;  Location: [facility];  Service: General;  Laterality: Right;   INSERTION OF MESH Right 12/19/2017   Procedure: INSERTION OF MESH;  Surgeon: Coralie Keens, MD;  Location: [facility];  Service: General;  Laterality: Right;   MASS EXCISION Right 12/19/2017   Procedure: EXCISION OF RIGHT SCROTAL MASS;  Surgeon: Coralie Keens, MD;  Location: [facility];  Service: General;  Laterality: Right;   TONSILLECTOMY      reports that he has quit smoking. His smoking use included cigarettes. He has a 50.00  pack-year smoking history. He has quit using smokeless tobacco.  His smokeless tobacco use included chew. He reports that he does not currently use alcohol. He reports that he does not use drugs. family history includes Diabetes in his mother; Heart disease in his mother; Hypertension in his father and mother; Stroke in his brother and father. No Known Allergies Current Outpatient Medications on File Prior to Visit  Medication Sig Dispense Refill   allopurinol (ZYLOPRIM) 100 MG tablet TAKE 1 TABLET BY MOUTH TWICE A DAY 180 tablet 3   Coenzyme Q10 (COQ-10 PO) Take 1 tablet by mouth 2 (two) times daily.     Continuous Blood Gluc Sensor (DEXCOM G7 SENSOR) MISC 1 Device by Does not apply route as directed. Change sensor every 10 days 3 each 3   cyclobenzaprine (FLEXERIL) 5 MG tablet TAKE 1 TABLET BY MOUTH THREE TIMES A DAY AS NEEDED FOR MUSCLE SPASMS (Patient taking differently: Take 5 mg by mouth 2 (two) times daily as needed for muscle spasms.) 30 tablet 5   diclofenac Sodium (VOLTAREN) 1 % GEL Apply 2 g topically 4 (four) times daily. (Patient taking differently: Apply 2 g topically 4 (four) times daily as needed (pain).) 50 g 0   FARXIGA 10 MG TABS tablet TAKE 1 TABLET BY MOUTH DAILY BEFORE BREAKFAST. 30 tablet 2   Ferrous Sulfate (IRON PO)  Take 1 tablet by mouth 2 (two) times daily.     ferrous sulfate 325 (65 FE) MG EC tablet Take 1 tablet (325 mg total) by mouth 2 (two) times daily. 60 tablet 0   gabapentin (NEURONTIN) 100 MG capsule Take 2 capsules (200 mg total) by mouth at bedtime. (Patient taking differently: Take 100 mg by mouth at bedtime.) 180 capsule 3   HUMULIN N 100 UNIT/ML injection Inject up to 30 units in morning and 40 units at bedtime 70 mL 3   indomethacin (INDOCIN) 25 MG capsule TAKE 1 CAPSULE BY MOUTH TWICE A DAY AS NEEDED (Patient taking differently: Take 25 mg by mouth at bedtime.) 180 capsule 1   insulin lispro (HUMALOG KWIKPEN) 100 UNIT/ML KwikPen Inject 10 Units into the  skin 3 (three) times daily. 15 mL 1   Insulin Pen Needle 31G X 5 MM MISC Use bid 100 each 1   lisinopril (ZESTRIL) 20 MG tablet TAKE 1 TABLET BY MOUTH EVERY DAY 90 tablet 3   lovastatin (MEVACOR) 40 MG tablet Take 1 tablet (40 mg total) by mouth at bedtime. 90 tablet 3   meloxicam (MOBIC) 15 MG tablet Take 1 tablet (15 mg total) by mouth daily as needed for pain. 30 tablet 11   metFORMIN (GLUCOPHAGE) 1000 MG tablet TAKE 1 TABLET BY MOUTH TWICE A DAY WITH FOOD 180 tablet 3   OVER THE COUNTER MEDICATION Take 1 tablet by mouth 2 (two) times daily. Beet Root supplement     pantoprazole (PROTONIX) 40 MG tablet Take 1 tablet (40 mg total) by mouth daily. 90 tablet 3   traMADol (ULTRAM) 50 MG tablet TAKE 1 TABLET BY MOUTH EVERY 6 HOURS AS NEEDED 60 tablet 2   glipiZIDE (GLUCOTROL XL) 10 MG 24 hr tablet Take 10 mg by mouth 2 (two) times daily.     hydrochlorothiazide (HYDRODIURIL) 25 MG tablet Take 25 mg by mouth daily.     No current facility-administered medications on file prior to visit.        ROS:  All others reviewed and negative.  Objective        PE:  BP 116/60 (BP Location: Right Arm, Patient Position: Sitting, Cuff Size: Large)   Pulse 68   Temp 98.2 F (36.8 C) (Oral)   Ht '5\' 8"'$  (1.727 m)   Wt 234 lb (106.1 kg)   SpO2 95%   BMI 35.58 kg/m                 Constitutional: Pt appears in NAD               HENT: Head: NCAT.                Right Ear: External ear normal.                 Left Ear: External ear normal.                Eyes: . Pupils are equal, round, and reactive to light. Conjunctivae and EOM are normal               Nose: without d/c or deformity               Neck: Neck supple. Gross normal ROM               Cardiovascular: Normal rate and regular rhythm.                 Pulmonary/Chest: Effort  normal and breath sounds without rales or wheezing.                Abd:  Soft, NT, ND, + BS, no organomegaly               Neurological: Pt is alert. At baseline  orientation, motor grossly intact               Skin: Skin is warm. No rashes, no other new lesions, LE edema - ***               Psychiatric: Pt behavior is normal without agitation   Micro: none  Cardiac tracings I have personally interpreted today:  none  Pertinent Radiological findings (summarize): none   Lab Results  Component Value Date   WBC 7.6 10/20/2021   HGB 11.5 (L) 10/20/2021   HCT 36.5 (L) 10/20/2021   PLT 367.0 10/20/2021   GLUCOSE 183 (H) 02/20/2022   CHOL 123 09/21/2021   TRIG 188.0 (H) 09/21/2021   HDL 32.70 (L) 09/21/2021   LDLCALC 53 09/21/2021   ALT 31 09/21/2021   AST 18 09/21/2021   NA 136 02/20/2022   K 4.1 02/20/2022   CL 102 02/20/2022   CREATININE 1.01 02/20/2022   BUN 19 02/20/2022   CO2 24 02/20/2022   TSH 2.03 09/21/2021   PSA 0.40 09/21/2021   HGBA1C 7.7 (H) 02/20/2022   MICROALBUR <0.7 09/21/2021   Assessment/Plan:  Steven [name] is a 61 y.o. Black or African American [2] male with  has a past medical history of Chicken pox, Diabetes mellitus without complication ([place]), Essential hypertension, GERD (gastroesophageal reflux disease), Gouty arthritis, Hypertension, Iron deficiency anemia, and Vitamin D deficiency.  No problem-specific Assessment & Plan notes found for this encounter.  Followup: No follow-ups on file.  Cathlean Cower, MD 03/24/2022 9:45 AM [facility]

## 2022-03-25 ENCOUNTER — Encounter: Payer: Self-pay | Admitting: Internal Medicine

## 2022-03-25 DIAGNOSIS — N3281 Overactive bladder: Secondary | ICD-10-CM | POA: Insufficient documentation

## 2022-03-25 DIAGNOSIS — N452 Orchitis: Secondary | ICD-10-CM | POA: Insufficient documentation

## 2022-03-25 NOTE — Assessment & Plan Note (Signed)
BP Readings from Last 3 Encounters:  03/24/22 116/60  02/23/22 106/82  02/14/22 112/68   overcontrolled recently with intermittent lightheaded,, pt to decrease lisinopril to 10 mg qd

## 2022-03-25 NOTE — Assessment & Plan Note (Signed)
Lab Results  Component Value Date   HGBA1C 8.0 (H) 03/24/2022   Uncontrolled mildly, pt to continue current medical treatment farxiga 10 mg qd, humulin N 30 am, 40 pm,, humalog 10 tid, metformin 1000 bid, and glucotrol xl 10 qd and prefers to f/u with endo next wk as planned

## 2022-03-25 NOTE — Assessment & Plan Note (Signed)
No recent overt bleeding, now for f/u iron lab and ferritin

## 2022-03-25 NOTE — Assessment & Plan Note (Signed)
Ok for new start vesicare 5 mg qd

## 2022-03-25 NOTE — Assessment & Plan Note (Signed)
Last vitamin D Lab Results  Component Value Date   VD25OH 18.06 (L) 03/24/2022   Low, to start oral replacement

## 2022-03-25 NOTE — Assessment & Plan Note (Signed)
Mild to mod, for antibx course doxycycline 100 bid,  to f/u any worsening symptoms or concerns 

## 2022-03-25 NOTE — Assessment & Plan Note (Signed)
Lab Results  Component Value Date   LDLCALC 53 09/21/2021   Stable, pt to continue current statin lovastatin 40 mg qd

## 2022-03-27 ENCOUNTER — Encounter: Payer: Self-pay | Admitting: Family Medicine

## 2022-03-27 ENCOUNTER — Ambulatory Visit: Payer: Self-pay

## 2022-03-27 ENCOUNTER — Ambulatory Visit: Payer: Commercial Managed Care - HMO | Admitting: Family Medicine

## 2022-03-27 VITALS — BP 106/66 | HR 79 | Ht 68.0 in | Wt 235.0 lb

## 2022-03-27 DIAGNOSIS — M25521 Pain in right elbow: Secondary | ICD-10-CM

## 2022-03-27 DIAGNOSIS — M1711 Unilateral primary osteoarthritis, right knee: Secondary | ICD-10-CM

## 2022-03-27 MED ORDER — KETOROLAC TROMETHAMINE 30 MG/ML IJ SOLN
30.0000 mg | Freq: Once | INTRAMUSCULAR | Status: AC
  Administered 2022-03-27: 30 mg via INTRAMUSCULAR

## 2022-03-27 NOTE — Patient Instructions (Signed)
We will get gel approved Continue with knee brace Ice 20 min at end of day See me in 6-8 weeks

## 2022-03-27 NOTE — Assessment & Plan Note (Signed)
Chronic problem with worsening symptoms.  We will ask if we can do viscosupplementation secondary to the moderate to severe arthritic changes.  Continue to wear the brace with the instability.  Discussed icing regimen and home exercises.  Felt it was too soon to do another steroid and with patient's also having some diabetes and other social determinants of health I think that this would be better.  We will have patient come back again in 6 to 8 weeks otherwise.  Continue to wear brace and home exercises otherwise.

## 2022-03-28 ENCOUNTER — Telehealth: Payer: Self-pay | Admitting: *Deleted

## 2022-03-28 NOTE — Telephone Encounter (Signed)
Visco - R knee initiated through portals.

## 2022-03-29 NOTE — Telephone Encounter (Signed)
Submitted prior auth form to [place].

## 2022-04-03 ENCOUNTER — Telehealth: Payer: Self-pay | Admitting: Internal Medicine

## 2022-04-03 NOTE — Telephone Encounter (Signed)
Pt called to report he is almost out of DOXYCYLINE.  Pt also states he spoke with provider about difficulty urinating but did not get a new medication to help with that. Pt also states he did not get an iron pill he thought would be sent in to the pharmacy. Also- LISINOPRIL 10 MG has not been sent in to pharmacy.  Preferred Pharmacy:  [id number] [place], [id number]: [phone] Fax: [phone]  Please call pt per his request: [phone]

## 2022-04-05 MED ORDER — TAMSULOSIN HCL 0.4 MG PO CAPS: 0.4000 mg | ORAL_CAPSULE | Freq: Every day | ORAL | 11 refills | Status: DC

## 2022-04-05 NOTE — Telephone Encounter (Signed)
It looks as though the lisinopril was increased on 11/2 to '20mg'$  so the patient is good on that one but it is time for a refill for doxyycline. Were you going to increase the quantity for more than 20 tabs? Not sure about the Rx for urinary hesitancy as previous discussed.Please advise.

## 2022-04-09 ENCOUNTER — Other Ambulatory Visit: Payer: Self-pay | Admitting: Internal Medicine

## 2022-04-09 NOTE — Telephone Encounter (Signed)
Please refill as per office routine med refill policy (all routine meds to be refilled for 3 mo or monthly (per pt preference) up to one year from last visit, then month to month grace period for 3 mo, then further med refills will have to be denied) ? ?

## 2022-04-10 ENCOUNTER — Other Ambulatory Visit: Payer: Self-pay

## 2022-04-12 NOTE — Telephone Encounter (Signed)
R knee Durolane approved 04/03/22 - 05/30/2022 Auth# [id number]

## 2022-04-20 ENCOUNTER — Other Ambulatory Visit: Payer: Self-pay | Admitting: Internal Medicine

## 2022-04-20 NOTE — Telephone Encounter (Signed)
Please refill as per office routine med refill policy (all routine meds to be refilled for 3 mo or monthly (per pt preference) up to one year from last visit, then month to month grace period for 3 mo, then further med refills will have to be denied) ? ?

## 2022-04-25 ENCOUNTER — Other Ambulatory Visit: Payer: Self-pay

## 2022-04-25 ENCOUNTER — Emergency Department (HOSPITAL_COMMUNITY)
Admission: EM | Admit: 2022-04-25 | Discharge: 2022-04-25 | Discharge status: Against Medical Advice | Payer: Commercial Managed Care - HMO | Attending: Medical | Admitting: Medical

## 2022-04-25 DIAGNOSIS — R5383 Other fatigue: Secondary | ICD-10-CM | POA: Insufficient documentation

## 2022-04-25 DIAGNOSIS — Z5321 Procedure and treatment not carried out due to patient leaving prior to being seen by health care provider: Secondary | ICD-10-CM | POA: Diagnosis not present

## 2022-04-25 DIAGNOSIS — R531 Weakness: Secondary | ICD-10-CM | POA: Diagnosis not present

## 2022-04-25 DIAGNOSIS — U071 COVID-19: Secondary | ICD-10-CM | POA: Diagnosis not present

## 2022-04-25 LAB — URINALYSIS, ROUTINE W REFLEX MICROSCOPIC
Bacteria, UA: NONE SEEN
Bilirubin Urine: NEGATIVE
Glucose, UA: >=500 mg/dL — AB
Ketones, ur: NEGATIVE mg/dL
Leukocytes,Ua: NEGATIVE
Nitrite: NEGATIVE
Protein, ur: NEGATIVE mg/dL
Specific Gravity, Urine: 1.007 (ref 1.005–1.030)
pH: 5 (ref 5.0–8.0)

## 2022-04-25 LAB — CBC WITH DIFFERENTIAL/PLATELET
Abs Immature Granulocytes: 0.02 10*3/uL (ref 0.00–0.07)
Basophils Absolute: 0 10*3/uL (ref 0.0–0.1)
Basophils Relative: 0 %
Eosinophils Absolute: 0 10*3/uL (ref 0.0–0.5)
Eosinophils Relative: 0 %
HCT: 40.1 % (ref 39.0–52.0)
Hemoglobin: 12.7 g/dL — ABNORMAL LOW (ref 13.0–17.0)
Immature Granulocytes: 0 %
Lymphocytes Relative: 11 %
Lymphs Abs: 0.7 10*3/uL (ref 0.7–4.0)
MCH: 25 pg — ABNORMAL LOW (ref 26.0–34.0)
MCHC: 31.7 g/dL (ref 30.0–36.0)
MCV: 79.1 fL — ABNORMAL LOW (ref 80.0–100.0)
Monocytes Absolute: 1 10*3/uL (ref 0.1–1.0)
Monocytes Relative: 16 %
Neutro Abs: 4.7 10*3/uL (ref 1.7–7.7)
Neutrophils Relative %: 73 %
Platelets: 284 10*3/uL (ref 150–400)
RBC: 5.07 MIL/uL (ref 4.22–5.81)
RDW: 17.7 % — ABNORMAL HIGH (ref 11.5–15.5)
WBC: 6.5 10*3/uL (ref 4.0–10.5)
nRBC: 0 % (ref 0.0–0.2)

## 2022-04-25 LAB — COMPREHENSIVE METABOLIC PANEL
ALT: 32 U/L (ref 0–44)
AST: 22 U/L (ref 15–41)
Albumin: 3.9 g/dL (ref 3.5–5.0)
Alkaline Phosphatase: 57 U/L (ref 38–126)
Anion gap: 10 (ref 5–15)
BUN: 20 mg/dL (ref 8–23)
CO2: 23 mmol/L (ref 22–32)
Calcium: 9.2 mg/dL (ref 8.9–10.3)
Chloride: 99 mmol/L (ref 98–111)
Creatinine, Ser: 1.4 mg/dL — ABNORMAL HIGH (ref 0.61–1.24)
GFR, Estimated: 56 mL/min — ABNORMAL LOW (ref >60)
Glucose, Bld: 197 mg/dL — ABNORMAL HIGH (ref 70–99)
Potassium: 4.1 mmol/L (ref 3.5–5.1)
Sodium: 132 mmol/L — ABNORMAL LOW (ref 135–145)
Total Bilirubin: 0.7 mg/dL (ref 0.3–1.2)
Total Protein: 7.2 g/dL (ref 6.5–8.1)

## 2022-04-25 LAB — RESP PANEL BY RT-PCR (FLU A&B, COVID) ARPGX2
Influenza A by PCR: NEGATIVE
Influenza B by PCR: NEGATIVE
SARS Coronavirus 2 by RT PCR: POSITIVE — AB

## 2022-04-25 LAB — BLOOD GAS, VENOUS
Acid-Base Excess: 1.4 mmol/L (ref 0.0–2.0)
Bicarbonate: 24.9 mmol/L (ref 20.0–28.0)
O2 Saturation: 91.1 %
Patient temperature: 37
pCO2, Ven: 35 mmHg — ABNORMAL LOW (ref 44–60)
pH, Ven: 7.46 — ABNORMAL HIGH (ref 7.25–7.43)
pO2, Ven: 58 mmHg — ABNORMAL HIGH (ref 32–45)

## 2022-04-25 LAB — CBG MONITORING, ED: Glucose-Capillary: 186 mg/dL — ABNORMAL HIGH (ref 70–99)

## 2022-04-25 LAB — TROPONIN I (HIGH SENSITIVITY): Troponin I (High Sensitivity): 5 ng/L (ref <18)

## 2022-04-25 NOTE — ED Provider Triage Note (Signed)
Emergency Medicine Provider Triage Evaluation Note  Steven [name] , a 61 y.o. male  was evaluated in triage.  Pt complains of generalized weakness and aversion to food for the last 2 days. Denies chest pain, SOB, abdominal pain, vomiting, diarrhea.  Review of Systems  Positive: fatigue Negative: Diarrhea, fever  Physical Exam  BP 130/78   Pulse (!) 104   Temp 99.8 F (37.7 C) (Oral)   Resp 19   Ht '5\' 8"'$  (1.727 m)   Wt 106.6 kg   SpO2 96%   BMI 35.73 kg/m  Gen:   Awake, no distress   Resp:  Normal effort  MSK:   Moves extremities without difficulty   Medical Decision Making  Medically screening exam initiated at 7:04 PM.  Appropriate orders placed.  Steven [name] was informed that the remainder of the evaluation will be completed by another provider, this initial triage assessment does not replace that evaluation, and the importance of remaining in the ED until their evaluation is complete.   Osvaldo Shipper, [place] 04/25/22 1905

## 2022-04-25 NOTE — ED Triage Notes (Signed)
Pt reports weakness and loss of appetite x 2 days.

## 2022-04-26 ENCOUNTER — Telehealth: Payer: Self-pay

## 2022-04-26 ENCOUNTER — Emergency Department (HOSPITAL_COMMUNITY)
Admission: EM | Admit: 2022-04-26 | Discharge: 2022-04-26 | Disposition: A | Discharge status: Home / Self Care | Payer: Commercial Managed Care - HMO | Attending: Emergency Medicine | Admitting: Emergency Medicine

## 2022-04-26 DIAGNOSIS — I10 Essential (primary) hypertension: Secondary | ICD-10-CM | POA: Diagnosis not present

## 2022-04-26 DIAGNOSIS — R5383 Other fatigue: Secondary | ICD-10-CM | POA: Diagnosis present

## 2022-04-26 DIAGNOSIS — U071 COVID-19: Secondary | ICD-10-CM | POA: Diagnosis not present

## 2022-04-26 DIAGNOSIS — Z79899 Other long term (current) drug therapy: Secondary | ICD-10-CM | POA: Insufficient documentation

## 2022-04-26 DIAGNOSIS — Z794 Long term (current) use of insulin: Secondary | ICD-10-CM | POA: Diagnosis not present

## 2022-04-26 DIAGNOSIS — Z7984 Long term (current) use of oral hypoglycemic drugs: Secondary | ICD-10-CM | POA: Insufficient documentation

## 2022-04-26 DIAGNOSIS — E119 Type 2 diabetes mellitus without complications: Secondary | ICD-10-CM | POA: Insufficient documentation

## 2022-04-26 LAB — CBG MONITORING, ED: Glucose-Capillary: 153 mg/dL — ABNORMAL HIGH (ref 70–99)

## 2022-04-26 MED ORDER — SODIUM CHLORIDE 0.9 % IV BOLUS
500.0000 mL | Freq: Once | INTRAVENOUS | Status: AC
  Administered 2022-04-26: 500 mL via INTRAVENOUS

## 2022-04-26 NOTE — ED Provider Notes (Signed)
Steven [name]   CSN: [id number] Arrival date & time: 04/26/22  0546     History  Chief Complaint  Patient presents with   Fatigue    Steven [name] is a 61 y.o. male.  Patient with history of hypertension, diabetes, GERD presents today with complaints of weakness and fatigue. He states that same has been ongoing for the past 2 days. He states that he has not had any appetite due to not feeling like food tastes right. He has not been taking his insulin because he has not been eating. Denies any fevers, chills, cough, congestion, chest pain, shortness of breath, nausea, vomiting, diarrhea, abdominal pain, hematuria, or dysuria.  The history is provided by the patient. No language interpreter was used.       Home Medications Prior to Admission medications   Medication Sig Start Date End Date Taking? Authorizing Provider  allopurinol (ZYLOPRIM) 100 MG tablet TAKE 1 TABLET BY MOUTH TWICE A DAY 10/13/21   [name], MD  Coenzyme Q10 (COQ-10 PO) Take 1 tablet by mouth 2 (two) times daily.    [provider]  Continuous Blood Gluc Sensor (DEXCOM G7 SENSOR) MISC 1 Device by Does not apply route as directed. Change sensor every 10 days 02/23/22   Elayne Snare, MD  cyclobenzaprine (FLEXERIL) 5 MG tablet TAKE 1 TABLET BY MOUTH THREE TIMES A DAY AS NEEDED FOR MUSCLE SPASMS Patient taking differently: Take 5 mg by mouth 2 (two) times daily as needed for muscle spasms. 09/20/21   [name], MD  diclofenac Sodium (VOLTAREN) 1 % GEL Apply 2 g topically 4 (four) times daily. Patient taking differently: Apply 2 g topically 4 (four) times daily as needed (pain). 08/31/20   Loni Beckwith, PA-C  doxycycline (VIBRA-TABS) 100 MG tablet Take 1 tablet (100 mg total) by mouth 2 (two) times daily. 03/24/22   [name], MD  FARXIGA 10 MG TABS tablet TAKE 1 TABLET BY MOUTH DAILY BEFORE BREAKFAST. 03/13/22   Elayne Snare, MD  Ferrous Sulfate  (IRON PO) Take 1 tablet by mouth 2 (two) times daily.    [provider]  ferrous sulfate 325 (65 FE) MG EC tablet Take 1 tablet (325 mg total) by mouth 2 (two) times daily. 07/22/21   Doran Stabler, MD  gabapentin (NEURONTIN) 100 MG capsule Take 2 capsules (200 mg total) by mouth at bedtime. Patient taking differently: Take 100 mg by mouth at bedtime. 03/30/20   Lyndal Pulley, DO  glipiZIDE (GLUCOTROL XL) 10 MG 24 hr tablet TAKE 1 TABLET BY MOUTH TWICE A DAY 04/24/22   [name], MD  HUMULIN N 100 UNIT/ML injection Inject up to 30 units in morning and 40 units at bedtime 03/13/22   Elayne Snare, MD  hydrochlorothiazide (HYDRODIURIL) 25 MG tablet TAKE 1 TABLET BY MOUTH EVERY DAY 04/10/22   [name], MD  indomethacin (INDOCIN) 25 MG capsule TAKE 1 CAPSULE BY MOUTH TWICE A DAY AS NEEDED Patient taking differently: Take 25 mg by mouth at bedtime. 10/13/21   [name], MD  insulin lispro (HUMALOG KWIKPEN) 100 UNIT/ML KwikPen Inject 10 Units into the skin 3 (three) times daily. 02/23/22   Elayne Snare, MD  Insulin Pen Needle 31G X 5 MM MISC Use bid 02/23/22   Elayne Snare, MD  lisinopril (ZESTRIL) 10 MG tablet Take 1 tablet (10 mg total) by mouth daily. 03/24/22   [name], MD  lovastatin (  MEVACOR) 40 MG tablet Take 1 tablet (40 mg total) by mouth at bedtime. 03/13/22   [name], MD  meloxicam (MOBIC) 15 MG tablet Take 1 tablet (15 mg total) by mouth daily as needed for pain. 03/23/21   [name], MD  metFORMIN (GLUCOPHAGE) 1000 MG tablet TAKE 1 TABLET BY MOUTH TWICE A DAY WITH FOOD 03/13/22   [name], MD  OVER THE COUNTER MEDICATION Take 1 tablet by mouth 2 (two) times daily. Beet Root supplement    [provider]  pantoprazole (PROTONIX) 40 MG tablet Take 1 tablet (40 mg total) by mouth daily. 12/09/21   Doran Stabler, MD  solifenacin (VESICARE) 5 MG tablet Take 1 tablet (5 mg total) by mouth daily. 03/24/22   [name], MD  tamsulosin  (FLOMAX) 0.4 MG CAPS capsule Take 1 capsule (0.4 mg total) by mouth daily. 04/05/22   [name], MD  traMADol Veatrice Bourbon) 50 MG tablet TAKE 1 TABLET BY MOUTH EVERY 6 HOURS AS NEEDED 03/15/22   [name], MD      Allergies    Patient has no known allergies.    Review of Systems   Review of Systems  Constitutional:  Positive for fatigue.  All other systems reviewed and are negative.   Physical Exam Updated Vital Signs BP (!) 116/54   Pulse 66   Temp 99.1 F (37.3 C) (Oral)   Resp 18   SpO2 93%  Physical Exam Vitals and nursing [name] reviewed.  Constitutional:      General: He is not in acute distress.    Appearance: Normal appearance. He is normal weight. He is not ill-appearing, toxic-appearing or diaphoretic.  HENT:     Head: Normocephalic and atraumatic.  Cardiovascular:     Rate and Rhythm: Normal rate and regular rhythm.     Heart sounds: Normal heart sounds.  Pulmonary:     Effort: Pulmonary effort is normal. No respiratory distress.     Breath sounds: Normal breath sounds.  Abdominal:     General: Abdomen is flat.     Palpations: Abdomen is soft.  Musculoskeletal:        General: Normal range of motion.     Cervical back: Normal range of motion.  Skin:    General: Skin is warm and dry.  Neurological:     General: No focal deficit present.     Mental Status: He is alert.  Psychiatric:        Mood and Affect: Mood normal.        Behavior: Behavior normal.    ED Results / Procedures / Treatments   Labs (all labs ordered are listed, but only abnormal results are displayed) Labs Reviewed  CBG MONITORING, ED - Abnormal; Notable for the following components:      Result Value   Glucose-Capillary 153 (*)    All other components within normal limits    EKG None  Radiology No results found.  Procedures Procedures    Medications Ordered in ED Medications  sodium chloride 0.9 % bolus 500 mL (500 mLs Intravenous New Bag/Given 04/26/22 0802)    ED  Course/ Medical Decision Making/ A&P                           Medical Decision Making  This patient is a 61 y.o. male who presents to the ED for concern of fatigue, this involves an extensive number  of treatment options, and is a complaint that carries with it a high risk of complications and morbidity. The emergent differential diagnosis prior to evaluation includes, but is not limited to,  UTI, COVID, dehydration, sepsis.   This is not an exhaustive differential.   Past Medical History / Co-morbidities / Social History: Hx htn, diabetes  Physical Exam: Physical exam performed. The pertinent findings include: no acute findings  Lab Tests: Labs ordered in triage last night, personally interpreted by me and reveal: no leukocytosis, hgb 12.7 improved from baseline. Glucose 186, UA noninfectious, COVID positive. NA 132, creatinine 1.40, up from 1.22 1 month ago    Cardiac Monitoring:  The patient was maintained on a cardiac monitor.  My attending physician Dr. Pearline Cables viewed and interpreted the cardiac monitored which showed an underlying rhythm of: no STEMI. I agree with this interpretation.   Medications: I ordered medication including fluids  for dehydration. Reevaluation of the patient after these medicines showed that the patient improved. I have reviewed the patients home medicines and have made adjustments as needed.    Disposition:  Patient presents today with complaints of generalized weakness and fatigue and diminished appetite x 2 days. He is afebrile, non-toxic appearing, and in no acute distress with reassuring vital signs. Found to be COVID +. No chest pain or shortness of breath. Suspect this to be the etiology of his symptoms. Work-up otherwise reassuring. No signs/symptoms of pneumonia or sepsis. Patient given fluids with symptomatic improvement. He is able to eat and drink normally. Discussed paxlovid which patient declines. No further emergent concerns, patient is stable to  be discharged. Patient understanding and amenable with plan. Emphasized importance of maintaining oral hydration as well as close pcp follow-up. Also educated on red flag symptoms that would prompt immediate return. Patient discharged in stable condition.   Final Clinical Impression(s) / ED Diagnoses Final diagnoses:  COVID    Rx / DC Orders ED Discharge Orders     None     An After Visit Summary was printed and given to the patient.     Bud Face, PA-C 04/26/22 [place], [place] A, DO 04/26/22 1606

## 2022-04-26 NOTE — Telephone Encounter (Signed)
Pt was called to inform Steven [name] he is doing okay and has no questions or concerns at this time.

## 2022-04-26 NOTE — ED Notes (Signed)
Pt given sandwich and diet coke

## 2022-04-26 NOTE — Telephone Encounter (Signed)
Transition Care Management Unsuccessful Follow-up Telephone Call  Date of discharge and from where:  04/26/22- [facility]  Attempts:  1st Attempt  Reason for unsuccessful TCM follow-up call:  Left voice message

## 2022-04-26 NOTE — Discharge Instructions (Signed)
As we discussed, you tested positive for COVID today. As this is a viral illness, no antibiotics are indicated.  I recommend that you rest and drink plenty of fluids.  You may also take Tylenol/ibuprofen as needed for fever and body aches.  Follow-up with your primary care doctor for your additional health needs.  Return if development of any new or worsening symptoms.

## 2022-04-26 NOTE — ED Triage Notes (Addendum)
Patient arrived stating that he has felt weak and loss of appetite over the last few days. Patient was asleep in the lobby all night and states he did not hear anyone call his name.

## 2022-04-27 ENCOUNTER — Telehealth: Payer: Self-pay

## 2022-04-27 NOTE — Telephone Encounter (Signed)
Transition Care Management Follow-up Telephone Call Date of discharge and from where: 11/29- [facility] How have you been since you were released from the hospital? Cough with phlegm Any questions or concerns? Yes- in need of cough syrup  Items Reviewed: Did the pt receive and understand the discharge instructions provided? Yes  Medications obtained and verified? No  Other? No  Any new allergies since your discharge? No  Dietary orders reviewed? Yes Do you have support at home? Yes   Home Care and Equipment/Supplies: Were home health services ordered? not applicable If so, what is the name of the agency?   Has the agency set up a time to come to the patient's home? not applicable Were any new equipment or medical supplies ordered?  No What is the name of the medical supply agency?  Were you able to get the supplies/equipment? not applicable Do you have any questions related to the use of the equipment or supplies? No  Functional Questionnaire: (I = Independent and D = Dependent) ADLs:   Bathing/Dressing-  Meal Prep-   Eating-   Maintaining continence-   Transferring/Ambulation-   Managing Meds-   Follow up appointments reviewed:  PCP Hospital f/u appt confirmed? Yes  Scheduled to see Dr.Burns on 12/4 @ 3:00. [facility] f/u appt confirmed? No   Are transportation arrangements needed? No  If their condition worsens, is the pt aware to call PCP or go to the Emergency Dept.? Yes Was the patient provided with contact information for the PCP's office or ED? Yes Was to pt encouraged to call back with questions or concerns? Yes

## 2022-04-29 ENCOUNTER — Other Ambulatory Visit: Payer: Self-pay | Admitting: Endocrinology

## 2022-04-30 NOTE — Patient Instructions (Signed)
Medications changes include :   none     Return if symptoms worsen or fail to improve.     COVID-19 COVID-19, or coronavirus disease 2019, is an infection that is caused by a new (novel) coronavirus called SARS-CoV-2. COVID-19 can cause many symptoms. In some people, the virus may not cause any symptoms. In others, it may cause mild or severe symptoms. Some people with severe infection develop severe disease. What are the causes? This illness is caused by a virus. The virus may be in the air as tiny specks of fluid (aerosols) or droplets, or it may be on surfaces. You may catch the virus by: Breathing in droplets from an infected person. Droplets can be spread by a person breathing, speaking, singing, coughing, or sneezing. Touching something, like a table or a doorknob, that has virus on it (is contaminated) and then touching your mouth, nose, or eyes. What increases the risk? Risk for infection: You are more likely to get infected with the COVID-19 virus if: You are within 6 ft (1.8 m) of a person with COVID-19 for 15 minutes or longer. You are providing care for a person who is infected with COVID-19. You are in close personal contact with other people. Close personal contact includes hugging, kissing, or sharing eating or drinking utensils. Risk for serious illness caused by COVID-19: You are more likely to get seriously ill from the COVID-19 virus if: You have cancer. You have a long-term (chronic) disease, such as: Chronic lung disease. This includes pulmonary embolism, chronic obstructive pulmonary disease, and cystic fibrosis. Long-term disease that lowers your body's ability to fight infection (immunocompromise). Serious cardiac conditions, such as heart failure, coronary artery disease, or cardiomyopathy. Diabetes. Chronic kidney disease. Liver diseases. These include cirrhosis, nonalcoholic fatty liver disease, alcoholic liver disease, or autoimmune hepatitis. You  have obesity. You are pregnant or were recently pregnant. You have sickle cell disease. What are the signs or symptoms? Symptoms of this condition can range from mild to severe. Symptoms may appear any time from 2 to 14 days after being exposed to the virus. They include: Fever or chills. Shortness of breath or trouble breathing. Feeling tired or very tired. Headaches, body aches, or muscle aches. Runny or stuffy nose, sneezing, coughing, or sore throat. New loss of taste or smell. This is rare. Some people may also have stomach problems, such as nausea, vomiting, or diarrhea. Other people may not have any symptoms of COVID-19. How is this diagnosed? This condition may be diagnosed by testing samples to check for the COVID-19 virus. The most common tests are the PCR test and the antigen test. Tests may be done in the lab or at home. They include: Using a swab to take a sample of fluid from the back of your nose and throat (nasopharyngeal fluid), from your nose, or from your throat. Testing a sample of saliva from your mouth. Testing a sample of coughed-up mucus from your lungs (sputum). How is this treated? Treatment for COVID-19 infection depends on the severity of the condition. Mild symptoms can be managed at home with rest, fluids, and over-the-counter medicines. Serious symptoms may be treated in a hospital intensive care unit (ICU). Treatment in the ICU may include: Supplemental oxygen. Extra oxygen is given through a tube in the nose, a face mask, or a hood. Medicines. These may include: Antivirals, such as monoclonal antibodies. These help your body fight off certain viruses that can cause disease. Anti-inflammatories, such as corticosteroids.  These reduce inflammation and suppress the immune system. Antithrombotics. These prevent or treat blood clots, if they develop. Convalescent plasma. This helps boost your immune system, if you have an underlying immunosuppressive condition or  are getting immunosuppressive treatments. Prone positioning. This means you will lie on your stomach. This helps oxygen to get into your lungs. Infection control measures. If you are at risk for more serious illness caused by COVID-19, your health care provider may prescribe two long-acting monoclonal antibodies, given together every 6 months. How is this prevented? To protect yourself: Use preventive medicine (pre-exposure prophylaxis). You may get pre-exposure prophylaxis if you have moderate or severe immunocompromise. Get vaccinated. Anyone 60 months old or older who meets guidelines can get a COVID-19 vaccine or vaccine series. This includes people who are pregnant or making breast milk (lactating). Get an added dose of COVID-19 vaccine after your first vaccine or vaccine series if you have moderate to severe immunocompromise. This applies if you have had a solid organ transplant or have been diagnosed with an immunocompromising condition. You should get the added dose 4 weeks after you got the first COVID-19 vaccine or vaccine series. If you get an mRNA vaccine, you will need a 3-dose primary series. If you get the J&J/Janssen vaccine, you will need a 2-dose primary series, with the second dose being an mRNA vaccine. Talk to your health care provider about getting experimental monoclonal antibodies. This treatment is approved under emergency use authorization to prevent severe illness before or after being exposed to the COVID-19 virus. You may be given monoclonal antibodies if: You have moderate or severe immunocompromise. This includes treatments that lower your immune response. People with immunocompromise may not develop protection against COVID-19 when they are vaccinated. You cannot be vaccinated. You may not get a vaccine if you have a severe allergic reaction to the vaccine or its components. You are not fully vaccinated. You are in a facility where COVID-19 is present and: Are in close  contact with a person who is infected with the COVID-19 virus. Are at high risk of being exposed to the COVID-19 virus. You are at risk of illness from new variants of the COVID-19 virus. To protect others: If you have symptoms of COVID-19, take steps to prevent the virus from spreading to others. Stay home. Leave your house only to get medical care. Do not use public transit, if possible. Do not travel while you are sick. Wash your hands often with soap and water for at least 20 seconds. If soap and water are not available, use alcohol-based hand sanitizer. Make sure that all people in your household wash their hands well and often. Cough or sneeze into a tissue or your sleeve or elbow. Do not cough or sneeze into your hand or into the air. Where to find more information Centers for Disease Control and Prevention: CharmC[employer].be World Health Organization: https://www.castaneda.info/ Get help right away if: You have trouble breathing. You have pain or pressure in your chest. You are confused. You have bluish lips and fingernails. You have trouble waking from sleep. You have symptoms that get worse. These symptoms may be an emergency. Get help right away. Call 911. Do not wait to see if the symptoms will go away. Do not drive yourself to the hospital. Summary COVID-19 is an infection that is caused by a new coronavirus. Sometimes, there are no symptoms. Other times, symptoms range from mild to severe. Some people with a severe COVID-19 infection develop severe disease. The virus  that causes COVID-19 can spread from person to person through droplets or aerosols from breathing, speaking, singing, coughing, or sneezing. Mild symptoms of COVID-19 can be managed at home with rest, fluids, and over-the-counter medicines. This information is not intended to replace advice given to you by your health care provider. Make sure you discuss any questions you have with your health  care provider. Document Revised: 05/03/2021 Document Reviewed: 05/05/2021 Elsevier Patient Education  [employer].

## 2022-04-30 NOTE — Progress Notes (Unsigned)
Subjective:    Patient ID: Steven [name], male    DOB: 1961-01-18, 61 y.o.   MRN: [id number]      HPI Hikeem is here for No chief complaint on file.    ED 11/29 for weakness, fatigue, decreased appetite x 2 days.  Found to be covid positive.  He declined paxlovid.       Medications and allergies reviewed with patient and updated if appropriate.  Current Outpatient Medications on File Prior to Visit  Medication Sig Dispense Refill   allopurinol (ZYLOPRIM) 100 MG tablet TAKE 1 TABLET BY MOUTH TWICE A DAY 180 tablet 3   Coenzyme Q10 (COQ-10 PO) Take 1 tablet by mouth 2 (two) times daily.     Continuous Blood Gluc Sensor (DEXCOM G7 SENSOR) MISC 1 Device by Does not apply route as directed. Change sensor every 10 days 3 each 3   cyclobenzaprine (FLEXERIL) 5 MG tablet TAKE 1 TABLET BY MOUTH THREE TIMES A DAY AS NEEDED FOR MUSCLE SPASMS (Patient taking differently: Take 5 mg by mouth 2 (two) times daily as needed for muscle spasms.) 30 tablet 5   diclofenac Sodium (VOLTAREN) 1 % GEL Apply 2 g topically 4 (four) times daily. (Patient taking differently: Apply 2 g topically 4 (four) times daily as needed (pain).) 50 g 0   doxycycline (VIBRA-TABS) 100 MG tablet Take 1 tablet (100 mg total) by mouth 2 (two) times daily. 20 tablet 0   FARXIGA 10 MG TABS tablet TAKE 1 TABLET BY MOUTH DAILY BEFORE BREAKFAST. 30 tablet 2   Ferrous Sulfate (IRON PO) Take 1 tablet by mouth 2 (two) times daily.     ferrous sulfate 325 (65 FE) MG EC tablet Take 1 tablet (325 mg total) by mouth 2 (two) times daily. 60 tablet 0   gabapentin (NEURONTIN) 100 MG capsule Take 2 capsules (200 mg total) by mouth at bedtime. (Patient taking differently: Take 100 mg by mouth at bedtime.) 180 capsule 3   glipiZIDE (GLUCOTROL XL) 10 MG 24 hr tablet TAKE 1 TABLET BY MOUTH TWICE A DAY 60 tablet 4   HUMULIN N 100 UNIT/ML injection Inject up to 30 units in morning and 40 units at bedtime 70 mL 3   hydrochlorothiazide (HYDRODIURIL)  25 MG tablet TAKE 1 TABLET BY MOUTH EVERY DAY 90 tablet 3   indomethacin (INDOCIN) 25 MG capsule TAKE 1 CAPSULE BY MOUTH TWICE A DAY AS NEEDED (Patient taking differently: Take 25 mg by mouth at bedtime.) 180 capsule 1   insulin lispro (HUMALOG KWIKPEN) 100 UNIT/ML KwikPen Inject 10 Units into the skin 3 (three) times daily. 15 mL 1   Insulin Pen Needle 31G X 5 MM MISC Use bid 100 each 1   lisinopril (ZESTRIL) 10 MG tablet Take 1 tablet (10 mg total) by mouth daily. 90 tablet 3   lovastatin (MEVACOR) 40 MG tablet Take 1 tablet (40 mg total) by mouth at bedtime. 90 tablet 3   meloxicam (MOBIC) 15 MG tablet Take 1 tablet (15 mg total) by mouth daily as needed for pain. 30 tablet 11   metFORMIN (GLUCOPHAGE) 1000 MG tablet TAKE 1 TABLET BY MOUTH TWICE A DAY WITH FOOD 180 tablet 3   OVER THE COUNTER MEDICATION Take 1 tablet by mouth 2 (two) times daily. Beet Root supplement     pantoprazole (PROTONIX) 40 MG tablet Take 1 tablet (40 mg total) by mouth daily. 90 tablet 3   solifenacin (VESICARE) 5 MG tablet Take 1 tablet (5 mg total)  by mouth daily. 90 tablet 3   tamsulosin (FLOMAX) 0.4 MG CAPS capsule Take 1 capsule (0.4 mg total) by mouth daily. 30 capsule 11   traMADol (ULTRAM) 50 MG tablet TAKE 1 TABLET BY MOUTH EVERY 6 HOURS AS NEEDED 60 tablet 2   No current facility-administered medications on file prior to visit.    Review of Systems     Objective:  There were no vitals filed for this visit. BP Readings from Last 3 Encounters:  04/26/22 130/67  04/25/22 130/78  03/27/22 106/66   Wt Readings from Last 3 Encounters:  04/25/22 235 lb 0.2 oz (106.6 kg)  03/27/22 235 lb (106.6 kg)  03/24/22 234 lb (106.1 kg)   There is no height or weight on file to calculate BMI.    Physical Exam         Assessment & Plan:    See Problem List for Assessment and Plan of chronic medical problems.

## 2022-05-01 ENCOUNTER — Encounter: Payer: Self-pay | Admitting: Internal Medicine

## 2022-05-01 ENCOUNTER — Ambulatory Visit (INDEPENDENT_AMBULATORY_CARE_PROVIDER_SITE_OTHER): Payer: Commercial Managed Care - HMO | Admitting: Internal Medicine

## 2022-05-01 VITALS — BP 110/78 | HR 71 | Temp 98.2°F | Ht 68.0 in | Wt 226.0 lb

## 2022-05-01 DIAGNOSIS — U071 COVID-19: Secondary | ICD-10-CM | POA: Diagnosis not present

## 2022-05-10 NOTE — Progress Notes (Signed)
[facility] Phone: [phone] Subjective:   Steven [name], am serving as a scribe for Dr. Hulan Saas.  I'm seeing this patient by the request  of:  [name], MD  CC: Right elbow and right knee pain  [id number]  03/27/2022 Chronic problem with worsening symptoms. We will ask if we can do viscosupplementation secondary to the moderate to severe arthritic changes. Continue to wear the brace with the instability. Discussed icing regimen and home exercises. Felt it was too soon to do another steroid and with patient's also having some diabetes and other social determinants of health I think that this would be better. We will have patient come back again in 6 to 8 weeks otherwise. Continue to wear brace and home exercises otherwise.   Update 05/15/2022 Steven [name] is a 61 y.o. male coming in with complaint of R elbow and R knee pain. Patient states that his knee is trying to "act right" patient is approved for Durolane, but states it is doing a lot better than it was.      Past Medical History:  Diagnosis Date   Chicken pox    Diabetes mellitus without complication ([place])    Essential hypertension    GERD (gastroesophageal reflux disease)    Gouty arthritis    Hypertension    Iron deficiency anemia    Vitamin D deficiency    Past Surgical History:  Procedure Laterality Date   BIOPSY  12/05/2021   Procedure: BIOPSY;  Surgeon: Doran Stabler, MD;  Location: [facility];  Service: Gastroenterology;;   ESOPHAGOGASTRODUODENOSCOPY N/A 12/05/2021   Procedure: ESOPHAGOGASTRODUODENOSCOPY (EGD);  Surgeon: Doran Stabler, MD;  Location: [facility];  Service: Gastroenterology;  Laterality: N/A;   HOT HEMOSTASIS N/A 12/05/2021   Procedure: HOT HEMOSTASIS (ARGON PLASMA COAGULATION/BICAP);  Surgeon: Doran Stabler, MD;  Location: [facility];  Service: Gastroenterology;  Laterality: N/A;    INGUINAL HERNIA REPAIR Right 12/19/2017   Procedure: HERNIA REPAIR INGUINAL RIGHT;  Surgeon: Coralie Keens, MD;  Location: [facility];  Service: General;  Laterality: Right;   INSERTION OF MESH Right 12/19/2017   Procedure: INSERTION OF MESH;  Surgeon: Coralie Keens, MD;  Location: [facility];  Service: General;  Laterality: Right;   MASS EXCISION Right 12/19/2017   Procedure: EXCISION OF RIGHT SCROTAL MASS;  Surgeon: Coralie Keens, MD;  Location: [facility];  Service: General;  Laterality: Right;   TONSILLECTOMY     Social History   Socioeconomic History   Marital status: Divorced    Spouse name: Not on file   Number of children: 1   Years of education: 12   Highest education level: Not on file  Occupational History   Occupation: retired  Tobacco Use   Smoking status: Former    Packs/day: 2.00    Years: 25.00    Total pack years: 50.00    Types: Cigarettes   Smokeless tobacco: Former    Types: Nurse, children's Use: Never used  Substance and Sexual Activity   Alcohol use: Not Currently   Drug use: No   Sexual activity: Yes    Birth control/protection: Condom  Other Topics Concern   Not on file  Social History Narrative   Fun: Just working   Investment banker, operational of Radio broadcast assistant Strain: Not on file  Food Insecurity: Not on file  Transportation Needs: Not on file  Physical Activity:  Not on file  Stress: Not on file  Social Connections: Not on file   No Known Allergies Family History  Problem Relation Age of Onset   Heart disease Mother    Hypertension Mother    Diabetes Mother    Stroke Father    Hypertension Father    Stroke Brother     Current Outpatient Medications (Endocrine & Metabolic):    FARXIGA 10 MG TABS tablet, TAKE 1 TABLET BY MOUTH DAILY BEFORE BREAKFAST.   glipiZIDE (GLUCOTROL XL) 10 MG 24 hr tablet, TAKE 1 TABLET BY MOUTH TWICE A DAY   HUMULIN N 100 UNIT/ML injection, Inject up to 30 units in morning and 40 units at  bedtime   insulin lispro (HUMALOG KWIKPEN) 100 UNIT/ML KwikPen, INJECT 10 UNITS INTO THE SKIN 3 (THREE) TIMES DAILY.   metFORMIN (GLUCOPHAGE) 1000 MG tablet, TAKE 1 TABLET BY MOUTH TWICE A DAY WITH FOOD  Current Outpatient Medications (Cardiovascular):    hydrochlorothiazide (HYDRODIURIL) 25 MG tablet, TAKE 1 TABLET BY MOUTH EVERY DAY   lisinopril (ZESTRIL) 10 MG tablet, Take 1 tablet (10 mg total) by mouth daily.   lovastatin (MEVACOR) 40 MG tablet, Take 1 tablet (40 mg total) by mouth at bedtime.   Current Outpatient Medications (Analgesics):    allopurinol (ZYLOPRIM) 100 MG tablet, TAKE 1 TABLET BY MOUTH TWICE A DAY   indomethacin (INDOCIN) 25 MG capsule, TAKE 1 CAPSULE BY MOUTH TWICE A DAY AS NEEDED (Patient taking differently: Take 25 mg by mouth at bedtime.)   meloxicam (MOBIC) 15 MG tablet, Take 1 tablet (15 mg total) by mouth daily as needed for pain.   traMADol (ULTRAM) 50 MG tablet, TAKE 1 TABLET BY MOUTH EVERY 6 HOURS AS NEEDED  Current Outpatient Medications (Hematological):    Ferrous Sulfate (IRON PO), Take 1 tablet by mouth 2 (two) times daily.   ferrous sulfate 325 (65 FE) MG EC tablet, Take 1 tablet (325 mg total) by mouth 2 (two) times daily.  Current Outpatient Medications (Other):    Coenzyme Q10 (COQ-10 PO), Take 1 tablet by mouth 2 (two) times daily.   Continuous Blood Gluc Sensor (DEXCOM G7 SENSOR) MISC, 1 Device by Does not apply route as directed. Change sensor every 10 days   cyclobenzaprine (FLEXERIL) 5 MG tablet, TAKE 1 TABLET BY MOUTH THREE TIMES A DAY AS NEEDED FOR MUSCLE SPASMS (Patient taking differently: Take 5 mg by mouth 2 (two) times daily as needed for muscle spasms.)   diclofenac Sodium (VOLTAREN) 1 % GEL, Apply 2 g topically 4 (four) times daily. (Patient taking differently: Apply 2 g topically 4 (four) times daily as needed (pain).)   gabapentin (NEURONTIN) 100 MG capsule, Take 2 capsules (200 mg total) by mouth at bedtime. (Patient taking differently:  Take 100 mg by mouth at bedtime.)   Insulin Pen Needle 31G X 5 MM MISC, Use bid   OVER THE COUNTER MEDICATION, Take 1 tablet by mouth 2 (two) times daily. Beet Root supplement   pantoprazole (PROTONIX) 40 MG tablet, Take 1 tablet (40 mg total) by mouth daily.   solifenacin (VESICARE) 5 MG tablet, Take 1 tablet (5 mg total) by mouth daily.   tamsulosin (FLOMAX) 0.4 MG CAPS capsule, Take 1 capsule (0.4 mg total) by mouth daily.   Reviewed prior external information including notes and imaging from  primary care provider As well as notes that were available from care everywhere and other healthcare systems.  Past medical history, social, surgical and family history all reviewed in  electronic medical record.  No pertanent information unless stated regarding to the chief complaint.   Review of Systems:  No headache, visual changes, nausea, vomiting, diarrhea, constipation, dizziness, abdominal pain, skin rash, fevers, chills, night sweats, weight loss, swollen lymph nodes, body aches, joint swelling, chest pain, shortness of breath, mood changes. POSITIVE muscle aches  Objective  Blood pressure 122/70, pulse 65, height '5\' 8"'$  (1.727 m), SpO2 97 %.   General: No apparent distress alert and oriented x3 mood and affect normal, dressed appropriately.  HEENT: Pupils equal, extraocular movements intact  Respiratory: Patient's speak in full sentences and does not appear short of breath  Cardiovascular: No lower extremity edema, non tender, no erythema  Right knee does have arthritic changes noted.  Tender to palpation over the medial joint line.  Crepitus noted.  After informed written and verbal consent, patient was seated on exam table. Right knee was prepped with alcohol swab and utilizing anterolateral approach, patient's right knee space was injected with 60 mg per 3 mL of Durolane (sodium hyaluronate) in a prefilled syringe was injected easily into the knee through a 22-gauge needle..Patient  tolerated the procedure well without immediate complications.   Impression and Recommendations:     The above documentation has been reviewed and is accurate and complete Lyndal Pulley, DO

## 2022-05-15 ENCOUNTER — Ambulatory Visit: Payer: Commercial Managed Care - HMO | Admitting: Family Medicine

## 2022-05-15 VITALS — BP 122/70 | HR 65 | Ht 68.0 in

## 2022-05-15 DIAGNOSIS — M1711 Unilateral primary osteoarthritis, right knee: Secondary | ICD-10-CM | POA: Diagnosis not present

## 2022-05-15 MED ORDER — SODIUM HYALURONATE 60 MG/3ML IX PRSY
60.0000 mg | PREFILLED_SYRINGE | Freq: Once | INTRA_ARTICULAR | Status: AC
  Administered 2022-05-15: 60 mg via INTRA_ARTICULAR

## 2022-05-15 NOTE — Patient Instructions (Addendum)
Good to see you  Durolane given in right knee Thurmond Butts will call about the brace Follow up in 3 months

## 2022-05-15 NOTE — Assessment & Plan Note (Signed)
Chronic problem with worsening symptoms.  Abnormal thigh to calf ratio and patient is ambulatory.  Patient does have minimal compartment disease that requires load reduction.  Patient has failed such things as multiple injections including steroid and viscosupplementation, patient's pain is limiting his activities of daily living and cannot walk greater than 200 feet without significant pain and instability.  Even affecting his quality of life and his livelihood.  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  Once again due to patient's abnormal thigh to calf ratio this would require custom bracing.  Patient does have instability of the knee.  Will give patient a custom molded brace that I think will be beneficial.  Patient will do icing regimen otherwise.  We discussed with patient about continuing to monitor his gout.  Continuing to stay active and icing regimen.  Follow-up again in 3 months for this chronic problem with worsening symptoms.

## 2022-05-25 ENCOUNTER — Other Ambulatory Visit: Payer: Self-pay | Admitting: Internal Medicine

## 2022-05-25 DIAGNOSIS — M25571 Pain in right ankle and joints of right foot: Secondary | ICD-10-CM

## 2022-05-25 DIAGNOSIS — M109 Gout, unspecified: Secondary | ICD-10-CM

## 2022-06-13 ENCOUNTER — Other Ambulatory Visit: Payer: Self-pay | Admitting: Endocrinology

## 2022-06-13 ENCOUNTER — Other Ambulatory Visit: Payer: Self-pay | Admitting: Internal Medicine

## 2022-06-13 DIAGNOSIS — E1165 Type 2 diabetes mellitus with hyperglycemia: Secondary | ICD-10-CM

## 2022-06-16 ENCOUNTER — Other Ambulatory Visit: Payer: Self-pay | Admitting: Internal Medicine

## 2022-06-26 ENCOUNTER — Other Ambulatory Visit (INDEPENDENT_AMBULATORY_CARE_PROVIDER_SITE_OTHER): Payer: Commercial Managed Care - HMO

## 2022-06-26 DIAGNOSIS — E1165 Type 2 diabetes mellitus with hyperglycemia: Secondary | ICD-10-CM | POA: Diagnosis not present

## 2022-06-26 DIAGNOSIS — Z794 Long term (current) use of insulin: Secondary | ICD-10-CM | POA: Diagnosis not present

## 2022-06-26 LAB — HEMOGLOBIN A1C: Hgb A1c MFr Bld: 8.1 % — ABNORMAL HIGH (ref 4.6–6.5)

## 2022-06-26 LAB — BASIC METABOLIC PANEL
BUN: 20 mg/dL (ref 6–23)
CO2: 25 mEq/L (ref 19–32)
Calcium: 9.3 mg/dL (ref 8.4–10.5)
Chloride: 99 mEq/L (ref 96–112)
Creatinine, Ser: 1.19 mg/dL (ref 0.40–1.50)
GFR: 64.79 mL/min (ref >60.00)
Glucose, Bld: 222 mg/dL — ABNORMAL HIGH (ref 70–99)
Potassium: 4.2 mEq/L (ref 3.5–5.1)
Sodium: 133 mEq/L — ABNORMAL LOW (ref 135–145)

## 2022-06-28 ENCOUNTER — Ambulatory Visit: Payer: Commercial Managed Care - HMO | Admitting: Endocrinology

## 2022-06-28 ENCOUNTER — Encounter: Payer: Self-pay | Admitting: Endocrinology

## 2022-06-28 VITALS — BP 120/72 | HR 84 | Ht 68.0 in | Wt 239.0 lb

## 2022-06-28 DIAGNOSIS — E1165 Type 2 diabetes mellitus with hyperglycemia: Secondary | ICD-10-CM

## 2022-06-28 DIAGNOSIS — Z794 Long term (current) use of insulin: Secondary | ICD-10-CM

## 2022-06-28 DIAGNOSIS — I10 Essential (primary) hypertension: Secondary | ICD-10-CM | POA: Diagnosis not present

## 2022-06-28 NOTE — Patient Instructions (Addendum)
N insulin 35 at bedtime, 25 in am  Humalog 5-10 min before meals and add 20 units at lunch

## 2022-06-28 NOTE — Progress Notes (Signed)
Patient ID: Steven [name], male   DOB: 20-Jun-1960, 62 y.o.   MRN: [id number]           Reason for Appointment: Type II Diabetes follow-up   History of Present Illness   Diagnosis date: 2010  Previous history:     Previously on metformin and started on insulin in 10/21 He has only been on NPH A1c range in the last few years: 8.1-12.1  Recent history:     Non-insulin hypoglycemic drugs: Farxiga 10 mg daily,   metformin 1 g twice daily:      Insulin regimen:   30 NPH, am and 25-30 hs Humalog 25 pc    Side effects from medications: None  Current self management, blood sugar patterns and problems identified: 9/23  A1c is back up to 8.1 He has not been able to go back to the Dexcom sensor since after his last visit and does not think he has been able to apply it properly He appears to be checking his blood sugars only randomly late morning and no other times Difficult to get a pattern of his blood sugars Also taking less insulin than prescribed especially the NPH With the insulin syringes he is dialing up on the approximate doses of insulin Also does not always take insulin consistently Appears that his fasting glucose may be higher than it was 222 in the lab His weight is trending higher No hypoglycemia reported also He usually is eating leftovers for breakfast and not excessive carbohydrate He has cut back on regular soft drinks but not stopped completely    Exercise: some walking   Diet management: Dinner 7 pm      Monitors blood glucose: Once or twice a day.    Glucometer: [employer] brand          Blood Glucose readings from recall:    PRE-MEAL Fasting Lunch Dinner Bedtime Overall  Glucose range: ? <150     Mean/median:        POST-MEAL PC Breakfast PC Lunch PC Dinner  Glucose range:   ?  Mean/median:       Dietician visit: Most recent: 03/2020    Weight control:  Wt Readings from Last 3 Encounters:  06/28/22 239 lb (108.4 kg)  05/01/22 226 lb (102.5 kg)   04/25/22 235 lb 0.2 oz (106.6 kg)            Diabetes labs:  Lab Results  Component Value Date   HGBA1C 8.1 (H) 06/26/2022   HGBA1C 8.0 (H) 03/24/2022   HGBA1C 7.7 (H) 02/20/2022   Lab Results  Component Value Date   MICROALBUR <0.7 09/21/2021   LDLCALC 53 09/21/2021   CREATININE 1.19 06/26/2022     Allergies as of 06/28/2022   No Known Allergies      Medication List        Accurate as of June 28, 2022  3:07 PM. If you have any questions, ask your nurse or doctor.          STOP taking these medications    IRON PO Stopped by: Elayne Snare, MD       TAKE these medications    allopurinol 100 MG tablet Commonly known as: ZYLOPRIM TAKE 1 TABLET BY MOUTH TWICE A DAY   COQ-10 PO Take 1 tablet by mouth 2 (two) times daily.   cyclobenzaprine 5 MG tablet Commonly known as: FLEXERIL TAKE 1 TABLET BY MOUTH THREE TIMES A DAY AS NEEDED FOR MUSCLE SPASMS   Dexcom G7 Sensor  Misc 1 Device by Does not apply route as directed. Change sensor every 10 days   diclofenac Sodium 1 % Gel Commonly known as: VOLTAREN Apply 2 g topically 4 (four) times daily. What changed:  when to take this reasons to take this   Farxiga 10 MG Tabs tablet Generic drug: dapagliflozin propanediol TAKE 1 TABLET BY MOUTH EVERY DAY BEFORE BREAKFAST   ferrous sulfate 325 (65 FE) MG EC tablet Take 1 tablet (325 mg total) by mouth 2 (two) times daily.   gabapentin 100 MG capsule Commonly known as: NEURONTIN Take 2 capsules (200 mg total) by mouth at bedtime. What changed: how much to take   glipiZIDE 10 MG 24 hr tablet Commonly known as: GLUCOTROL XL TAKE 1 TABLET BY MOUTH TWICE A DAY   HumaLOG KwikPen 100 UNIT/ML KwikPen Generic drug: insulin lispro INJECT 10 UNITS INTO THE SKIN 3 (THREE) TIMES DAILY.   HumuLIN N 100 UNIT/ML injection Generic drug: insulin NPH Human Inject up to 30 units in morning and 40 units at bedtime   hydrochlorothiazide 25 MG tablet Commonly known as:  HYDRODIURIL TAKE 1 TABLET BY MOUTH EVERY DAY   indomethacin 25 MG capsule Commonly known as: INDOCIN TAKE 1 CAPSULE BY MOUTH TWICE A DAY AS NEEDED   Insulin Pen Needle 31G X 5 MM Misc Use bid   lisinopril 10 MG tablet Commonly known as: ZESTRIL Take 1 tablet (10 mg total) by mouth daily.   lovastatin 40 MG tablet Commonly known as: MEVACOR Take 1 tablet (40 mg total) by mouth at bedtime.   meloxicam 15 MG tablet Commonly known as: MOBIC TAKE 1 TABLET BY MOUTH EVERY DAY AS NEEDED FOR PAIN   metFORMIN 1000 MG tablet Commonly known as: GLUCOPHAGE TAKE 1 TABLET BY MOUTH TWICE A DAY WITH FOOD   OVER THE COUNTER MEDICATION Take 1 tablet by mouth 2 (two) times daily. Beet Root supplement   pantoprazole 40 MG tablet Commonly known as: PROTONIX Take 1 tablet (40 mg total) by mouth daily.   solifenacin 5 MG tablet Commonly known as: VESIcare Take 1 tablet (5 mg total) by mouth daily.   tamsulosin 0.4 MG Caps capsule Commonly known as: FLOMAX Take 1 capsule (0.4 mg total) by mouth daily.   traMADol 50 MG tablet Commonly known as: ULTRAM TAKE 1 TABLET BY MOUTH EVERY 6 HOURS AS NEEDED        Allergies: No Known Allergies  Past Medical History:  Diagnosis Date   Chicken pox    Diabetes mellitus without complication ([place])    Essential hypertension    GERD (gastroesophageal reflux disease)    Gouty arthritis    Hypertension    Iron deficiency anemia    Vitamin D deficiency     Past Surgical History:  Procedure Laterality Date   BIOPSY  12/05/2021   Procedure: BIOPSY;  Surgeon: Doran Stabler, MD;  Location: [facility];  Service: Gastroenterology;;   ESOPHAGOGASTRODUODENOSCOPY N/A 12/05/2021   Procedure: ESOPHAGOGASTRODUODENOSCOPY (EGD);  Surgeon: Doran Stabler, MD;  Location: [facility];  Service: Gastroenterology;  Laterality: N/A;   HOT HEMOSTASIS N/A 12/05/2021   Procedure: HOT HEMOSTASIS (ARGON PLASMA COAGULATION/BICAP);  Surgeon: Doran Stabler, MD;  Location: [facility];  Service: Gastroenterology;  Laterality: N/A;   INGUINAL HERNIA REPAIR Right 12/19/2017   Procedure: HERNIA REPAIR INGUINAL RIGHT;  Surgeon: Coralie Keens, MD;  Location: [facility];  Service: General;  Laterality: Right;   INSERTION OF MESH Right 12/19/2017   Procedure:  INSERTION OF MESH;  Surgeon: Coralie Keens, MD;  Location: [facility];  Service: General;  Laterality: Right;   MASS EXCISION Right 12/19/2017   Procedure: EXCISION OF RIGHT SCROTAL MASS;  Surgeon: Coralie Keens, MD;  Location: [facility];  Service: General;  Laterality: Right;   TONSILLECTOMY      Family History  Problem Relation Age of Onset   Heart disease Mother    Hypertension Mother    Diabetes Mother    Stroke Father    Hypertension Father    Stroke Brother     Social History:  reports that he has quit smoking. His smoking use included cigarettes. He has a 50.00 pack-year smoking history. He has quit using smokeless tobacco.  His smokeless tobacco use included chew. He reports that he does not currently use alcohol. He reports that he does not use drugs.  Review of Systems:  Last diabetic eye exam date: 2021?  Last foot exam date:4/23 No symptoms of numbness in the feet   Microalbumin normal as of 4/23  Hypertension: Treated by PCP with HCTZ and lisinopril He was supposed to stop HCTZ   BP Readings from Last 3 Encounters:  06/28/22 120/72  05/15/22 122/70  05/01/22 110/78    Lipids: Controlled with lovastatin 40 mg prescribed by Dr. Jenny Reichmann    Lab Results  Component Value Date   CHOL 133 03/24/2022   CHOL 123 09/21/2021   CHOL 140 03/23/2021   Lab Results  Component Value Date   HDL 34.00 (L) 03/24/2022   HDL 32.70 (L) 09/21/2021   HDL 35.80 (L) 03/23/2021   Lab Results  Component Value Date   LDLCALC 53 09/21/2021   LDLCALC 73 03/23/2021   LDLCALC 68 05/18/2020   Lab Results  Component Value Date   TRIG 239.0 (H) 03/24/2022   TRIG 188.0 (H) 09/21/2021    TRIG 155.0 (H) 03/23/2021   Lab Results  Component Value Date   CHOLHDL 4 03/24/2022   CHOLHDL 4 09/21/2021   CHOLHDL 4 03/23/2021   Lab Results  Component Value Date   LDLDIRECT 78.0 03/24/2022     Examination:   BP 120/72 (BP Location: Left Arm, Patient Position: Sitting, Cuff Size: Large)   Pulse 84   Ht '5\' 8"'$  (1.727 m)   Wt 239 lb (108.4 kg)   SpO2 96%   BMI 36.34 kg/m   Body mass index is 36.34 kg/m.    ASSESSMENT/ PLAN:    Diabetes type 2:   Current regimen: NPH insulin twice a day, Humalog at breakfast and dinner Farxiga 10 mg, and metformin  A1c is still high at 8.1  He appears to be getting inadequate insulin and difficult to identify blood sugar patterns Also has taken less NPH at night than previously and likely has high fasting readings  Recommendations:  He will follow-up with the diabetes educator regarding using the Dexcom sensor and what his blood sugar patterns are  INSULIN doses:  N insulin 35 at bedtime, 25 in am May need more than 35 minutes if fasting blood sugar stays high  Humalog to be taken 5-10 min before meals and add another 20 units at lunch while continuing 25 units before breakfast and dinner Avoid regular soft drinks completely Reviewed diet again with dietitian Needs to do some regular walking for weight loss No change in [place] or metformin  HYPERTENSION: Blood pressure is well-controlled   Patient Instructions  N insulin 35 at bedtime, 25 in am  Humalog 5-10 min before meals  and add 20 units at lunch  Total visit time for evaluation and management and counseling = 30 minutes  Elayne Snare 06/28/2022, 3:07 PM

## 2022-07-05 ENCOUNTER — Encounter: Payer: Commercial Managed Care - HMO | Attending: Endocrinology | Admitting: Nutrition

## 2022-07-05 DIAGNOSIS — E1165 Type 2 diabetes mellitus with hyperglycemia: Secondary | ICD-10-CM | POA: Diagnosis present

## 2022-07-05 NOTE — Patient Instructions (Signed)
Humalog:  25u before breakfast and supper                   20u before lunch NPH: AM: 25u    Bedtime: 35u.   Call Dr. Ronnie Derby office if blood sugars drop low Take Humalog 10 minutes before all meals

## 2022-07-05 NOTE — Progress Notes (Signed)
NPH:  35u bid Humalog:  5u acB.  Blood sugar was 249 1 hr pcB today                   0u acL  forgets,                    0u supper   forgets  Says his after visit summary did not give him a Humalog dose for meals.  So was giving only 5 acB CGM  Has had a lot of difficulty with dexcoms sticking.  Started using again 1 week ago.  Gave him some IV prep wipes to use to help this stick.  He can not see to attach the extra adhesive and it is very sticky and he can not attach this, due to shoulder issues.   No data on dexcom clarity.   Reset password to [phone], but has wrong email, and we were not able to change this.  He will call them and have them change this.   Discussion: Need for humalog before all meals Written instructions given for Humalog 25u ac B and acS, and 20u acL Written instructions given for NPH:  AM: 25u and 36u at bedtime.  Discussed that if he takes his Humalog acB and AcL, will not need 35u of NPH in the AM.  He agreed to try this dosage with fear of dropping low.  Told him to call the office if blood sugars drop, and to have some regular soda to drink only he drops low.  He agreed to do this.

## 2022-07-06 ENCOUNTER — Other Ambulatory Visit: Payer: Self-pay | Admitting: Endocrinology

## 2022-07-06 DIAGNOSIS — E1165 Type 2 diabetes mellitus with hyperglycemia: Secondary | ICD-10-CM

## 2022-07-12 ENCOUNTER — Telehealth: Payer: Self-pay | Admitting: Nutrition

## 2022-07-12 NOTE — Telephone Encounter (Unsigned)
Pt. Lvm to call him.  I returned his call and had to lvm for him

## 2022-08-01 ENCOUNTER — Other Ambulatory Visit: Payer: Self-pay | Admitting: Endocrinology

## 2022-08-04 ENCOUNTER — Other Ambulatory Visit: Payer: Self-pay

## 2022-08-04 DIAGNOSIS — E1165 Type 2 diabetes mellitus with hyperglycemia: Secondary | ICD-10-CM

## 2022-08-04 MED ORDER — DAPAGLIFLOZIN PROPANEDIOL 10 MG PO TABS: ORAL_TABLET | ORAL | 0 refills | Status: DC

## 2022-08-14 NOTE — Progress Notes (Unsigned)
Steven [name] [address] [place] Phone: [phone] Subjective:   Steven [name], am serving as a scribe for Dr. Hulan Saas.  I'm seeing this patient by the request  of:  [name], MD  CC: Right knee pain  [id number]  05/15/2022 Chronic problem with worsening symptoms.  Abnormal thigh to calf ratio and patient is ambulatory.  Patient does have minimal compartment disease that requires load reduction.  Patient has failed such things as multiple injections including steroid and viscosupplementation, patient's pain is limiting his activities of daily living and cannot walk greater than 200 feet without significant pain and instability.  Even affecting his quality of life and his livelihood.  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  Once again due to patient's abnormal thigh to calf ratio this would require custom bracing.  Patient does have instability of the knee.  Will give patient a custom molded brace that I think will be beneficial.  Patient will do icing regimen otherwise.  We discussed with patient about continuing to monitor his gout.  Continuing to stay active and icing regimen.  Follow-up again in 3 months for this chronic problem with worsening symptoms.      Update 08/15/2022 Steven [name] is a 62 y.o. male coming in with complaint of R knee pain. Patient states today knee is feeling okay because he had some days of rest. He has been wearing the brace and rest helps the knee feel better. Last injection did work, but has worn off.      Past Medical History:  Diagnosis Date   Chicken pox    Diabetes mellitus without complication ([place])    Essential hypertension    GERD (gastroesophageal reflux disease)    Gouty arthritis    Hypertension    Iron deficiency anemia    Vitamin D deficiency    Past Surgical History:   Procedure Laterality Date   BIOPSY  12/05/2021   Procedure: BIOPSY;  Surgeon: Doran Stabler, MD;  Location: [facility];  Service: Gastroenterology;;   ESOPHAGOGASTRODUODENOSCOPY N/A 12/05/2021   Procedure: ESOPHAGOGASTRODUODENOSCOPY (EGD);  Surgeon: Doran Stabler, MD;  Location: [facility];  Service: Gastroenterology;  Laterality: N/A;   HOT HEMOSTASIS N/A 12/05/2021   Procedure: HOT HEMOSTASIS (ARGON PLASMA COAGULATION/BICAP);  Surgeon: Doran Stabler, MD;  Location: [facility];  Service: Gastroenterology;  Laterality: N/A;   INGUINAL HERNIA REPAIR Right 12/19/2017   Procedure: HERNIA REPAIR INGUINAL RIGHT;  Surgeon: Coralie Keens, MD;  Location: [facility];  Service: General;  Laterality: Right;   INSERTION OF MESH Right 12/19/2017   Procedure: INSERTION OF MESH;  Surgeon: Coralie Keens, MD;  Location: [facility];  Service: General;  Laterality: Right;   MASS EXCISION Right 12/19/2017   Procedure: EXCISION OF RIGHT SCROTAL MASS;  Surgeon: Coralie Keens, MD;  Location: [facility];  Service: General;  Laterality: Right;   TONSILLECTOMY     Social History   Socioeconomic History   Marital status: Divorced    Spouse name: Not on file   Number of children: 1   Years of education: 12   Highest education level: Not on file  Occupational History   Occupation: retired  Tobacco Use   Smoking status: Former    Packs/day: 2.00    Years: 25.00    Additional pack years: 0.00    Total pack years: 50.00  Types: Cigarettes   Smokeless tobacco: Former    Types: Nurse, children's Use: Never used  Substance and Sexual Activity   Alcohol use: Not Currently   Drug use: No   Sexual activity: Yes    Birth control/protection: Condom  Other Topics Concern   Not on file  Social History Narrative   Fun: Just working   Investment banker, operational of Radio broadcast assistant Strain: Not on file  Food Insecurity: Not on file  Transportation Needs: Not on file  Physical  Activity: Not on file  Stress: Not on file  Social Connections: Not on file   No Known Allergies Family History  Problem Relation Age of Onset   Heart disease Mother    Hypertension Mother    Diabetes Mother    Stroke Father    Hypertension Father    Stroke Brother     Current Outpatient Medications (Endocrine & Metabolic):    dapagliflozin propanediol (FARXIGA) 10 MG TABS tablet, TAKE 1 TABLET BY MOUTH EVERY DAY BEFORE BREAKFAST   glipiZIDE (GLUCOTROL XL) 10 MG 24 hr tablet, TAKE 1 TABLET BY MOUTH TWICE A DAY   HUMULIN N 100 UNIT/ML injection, Inject up to 30 units in morning and 40 units at bedtime   insulin lispro (HUMALOG KWIKPEN) 100 UNIT/ML KwikPen, INJECT 10 UNITS INTO THE SKIN 3 (THREE) TIMES DAILY.   metFORMIN (GLUCOPHAGE) 1000 MG tablet, TAKE 1 TABLET BY MOUTH TWICE A DAY WITH FOOD  Current Outpatient Medications (Cardiovascular):    hydrochlorothiazide (HYDRODIURIL) 25 MG tablet, TAKE 1 TABLET BY MOUTH EVERY DAY   lisinopril (ZESTRIL) 10 MG tablet, Take 1 tablet (10 mg total) by mouth daily.   lovastatin (MEVACOR) 40 MG tablet, Take 1 tablet (40 mg total) by mouth at bedtime.   Current Outpatient Medications (Analgesics):    allopurinol (ZYLOPRIM) 100 MG tablet, TAKE 1 TABLET BY MOUTH TWICE A DAY   indomethacin (INDOCIN) 25 MG capsule, TAKE 1 CAPSULE BY MOUTH TWICE A DAY AS NEEDED   meloxicam (MOBIC) 15 MG tablet, TAKE 1 TABLET BY MOUTH EVERY DAY AS NEEDED FOR PAIN   traMADol (ULTRAM) 50 MG tablet, TAKE 1 TABLET BY MOUTH EVERY 6 HOURS AS NEEDED  Current Outpatient Medications (Hematological):    ferrous sulfate 325 (65 FE) MG EC tablet, Take 1 tablet (325 mg total) by mouth 2 (two) times daily.  Current Outpatient Medications (Other):    Coenzyme Q10 (COQ-10 PO), Take 1 tablet by mouth 2 (two) times daily.   Continuous Blood Gluc Sensor (DEXCOM G7 SENSOR) MISC, 1 Device by Does not apply route as directed. Change sensor every 10 days (Patient not taking: Reported on  06/28/2022)   cyclobenzaprine (FLEXERIL) 5 MG tablet, TAKE 1 TABLET BY MOUTH THREE TIMES A DAY AS NEEDED FOR MUSCLE SPASMS   diclofenac Sodium (VOLTAREN) 1 % GEL, Apply 2 g topically 4 (four) times daily. (Patient taking differently: Apply 2 g topically 4 (four) times daily as needed (pain).)   gabapentin (NEURONTIN) 100 MG capsule, Take 2 capsules (200 mg total) by mouth at bedtime. (Patient taking differently: Take 100 mg by mouth at bedtime.)   Insulin Pen Needle (B-D ULTRAFINE III SHORT PEN) 31G X 8 MM MISC, USE TWICE A DAY   OVER THE COUNTER MEDICATION, Take 1 tablet by mouth 2 (two) times daily. Beet Root supplement   pantoprazole (PROTONIX) 40 MG tablet, Take 1 tablet (40 mg total) by mouth daily.   solifenacin (VESICARE) 5 MG tablet,  Take 1 tablet (5 mg total) by mouth daily.   tamsulosin (FLOMAX) 0.4 MG CAPS capsule, Take 1 capsule (0.4 mg total) by mouth daily.   Reviewed prior external information including notes and imaging from  primary care provider As well as notes that were available from care everywhere and other healthcare systems.  Past medical history, social, surgical and family history all reviewed in electronic medical record.  No pertanent information unless stated regarding to the chief complaint.   Review of Systems:  No headache, visual changes, nausea, vomiting, diarrhea, constipation, dizziness, abdominal pain, skin rash, fevers, chills, night sweats, weight loss, swollen lymph nodes, body aches, joint swelling, chest pain, shortness of breath, mood changes. POSITIVE muscle aches  Objective  Blood pressure 116/68, pulse 94, height 5\' 8"  (1.727 m), weight 239 lb (108.4 kg), SpO2 99 %.   General: No apparent distress alert and oriented x3 mood and affect normal, dressed appropriately.  HEENT: Pupils equal, extraocular movements intact  Respiratory: Patient's speak in full sentences and does not appear short of breath  Cardiovascular: No lower extremity edema, non  tender, no erythema  Right knee exam shows the patient does have instability noted with varus deformity of the knee.  Valgus and deformity also with instability.  Patient has crepitus with range of motion.  Tender to palpation of the medial joint line  After informed written and verbal consent, patient was seated on exam table. Right knee was prepped with alcohol swab and utilizing anterolateral approach, patient's right knee space was injected with 4:1  marcaine 0.5%: Kenalog 40mg /dL. Patient tolerated the procedure well without immediate complications.    Impression and Recommendations:    The above documentation has been reviewed and is accurate and complete Lyndal Pulley, DO

## 2022-08-15 ENCOUNTER — Encounter: Payer: Self-pay | Admitting: Family Medicine

## 2022-08-15 ENCOUNTER — Ambulatory Visit: Payer: Commercial Managed Care - HMO | Admitting: Family Medicine

## 2022-08-15 VITALS — BP 116/68 | HR 94 | Ht 68.0 in | Wt 239.0 lb

## 2022-08-15 DIAGNOSIS — M1711 Unilateral primary osteoarthritis, right knee: Secondary | ICD-10-CM | POA: Diagnosis not present

## 2022-08-15 NOTE — Assessment & Plan Note (Signed)
Chronic problem with worsening symptoms.  Patient is still ambulating using the cane at this time.  Discussed icing regimen and home exercises, discussed which activities to do and which ones to be used.  Increase activity slowly.  Follow-up again in 3 months.  Social determinants of health includes venous insufficiency and gout that could be contributing to some of the discomfort and pain.

## 2022-08-15 NOTE — Patient Instructions (Addendum)
Injection in knee today Good to see you! See you again in 3 months

## 2022-08-24 ENCOUNTER — Other Ambulatory Visit: Payer: Self-pay | Admitting: Gastroenterology

## 2022-08-24 ENCOUNTER — Other Ambulatory Visit: Payer: Self-pay | Admitting: Internal Medicine

## 2022-08-24 ENCOUNTER — Other Ambulatory Visit: Payer: Self-pay | Admitting: Endocrinology

## 2022-08-28 ENCOUNTER — Other Ambulatory Visit (INDEPENDENT_AMBULATORY_CARE_PROVIDER_SITE_OTHER): Payer: Commercial Managed Care - HMO

## 2022-08-28 DIAGNOSIS — E1165 Type 2 diabetes mellitus with hyperglycemia: Secondary | ICD-10-CM

## 2022-08-28 DIAGNOSIS — Z794 Long term (current) use of insulin: Secondary | ICD-10-CM | POA: Diagnosis not present

## 2022-08-28 LAB — BASIC METABOLIC PANEL
BUN: 25 mg/dL — ABNORMAL HIGH (ref 6–23)
CO2: 24 mEq/L (ref 19–32)
Calcium: 9.6 mg/dL (ref 8.4–10.5)
Chloride: 101 mEq/L (ref 96–112)
Creatinine, Ser: 1.33 mg/dL (ref 0.40–1.50)
GFR: 56.63 mL/min — ABNORMAL LOW (ref >60.00)
Glucose, Bld: 175 mg/dL — ABNORMAL HIGH (ref 70–99)
Potassium: 3.9 mEq/L (ref 3.5–5.1)
Sodium: 130 mEq/L — ABNORMAL LOW (ref 135–145)

## 2022-08-29 LAB — FRUCTOSAMINE: Fructosamine: 271 umol/L (ref 0–285)

## 2022-08-30 ENCOUNTER — Encounter: Payer: Self-pay | Admitting: Endocrinology

## 2022-08-30 ENCOUNTER — Ambulatory Visit: Payer: Commercial Managed Care - HMO | Admitting: Endocrinology

## 2022-08-30 VITALS — BP 108/68 | HR 74 | Ht 68.0 in | Wt 237.6 lb

## 2022-08-30 DIAGNOSIS — Z794 Long term (current) use of insulin: Secondary | ICD-10-CM | POA: Diagnosis not present

## 2022-08-30 DIAGNOSIS — E1165 Type 2 diabetes mellitus with hyperglycemia: Secondary | ICD-10-CM

## 2022-08-30 DIAGNOSIS — I10 Essential (primary) hypertension: Secondary | ICD-10-CM | POA: Diagnosis not present

## 2022-08-30 NOTE — Patient Instructions (Addendum)
N INSULIN 22 IN AM AND 35 AT BEDTIME  Humalog 15-16 before breakfast and 12-13 at supper  Limit cokes to 12 oz

## 2022-08-30 NOTE — Progress Notes (Signed)
Patient ID: Steven [name], male   DOB: 1960-08-09, 62 y.o.   MRN: [id number]           Reason for Appointment: Type II Diabetes follow-up   History of Present Illness   Diagnosis date: 2010  Previous history:     Previously on metformin and started on insulin in 10/21 He has only been on NPH A1c range in the last few years: 8.1-12.1  Recent history:     Non-insulin hypoglycemic drugs: Farxiga 10 mg daily,   metformin 1 g twice daily:      Insulin regimen:   25 NPH, am and 35 hs Humalog 11 pc    Side effects from medications: None  Current self management, blood sugar patterns and problems identified: 9/23  A1c is last at 8.1, fructosamine is 271 He has been able to go back to the Dexcom sensor since after his last visit  However again is having some issues with the sensor falling off when he is taking off his chart and has not had any data since 2/10 Also has not checked his blood sugars with fingerstick lately He was told to change his NPH doses on the last visit and with this his blood sugars are better in the mornings overall but may be low normal in the afternoon Also he says he was getting some low sugars with taking 25 or some Humalog at meals and is only taking 11 units This appears to be inadequate at breakfast at least His weight about the same He may tend to have some hypoglycemia around 2-3 PM especially if he is feeling a low carbohydrate lunch or being late He usually is eating leftovers for breakfast and this is his largest meal He has cut back on regular soft drinks but still will drink 16 ounces of Coke at dinnertime   Exercise: some walking   Diet management: Dinner 7 pm  Dexcom sensor interpretation  Data interpreted between 06/28/2022 and 2/10 CGM was active 99% of the time  He has moderate variability in his blood sugars overnight but these are generally in the mid 100 range average without any hypoglycemia Blood sugars are usually rising  significantly at breakfast time with POSTPRANDIAL readings also somewhat higher after dinner Premeal readings are generally much lower in the afternoon between 1-5 PM but slightly higher at dinnertime Postprandial readings after breakfast are AVERAGING 210 with some variability After dinnertime average blood sugar is 147 with some variability As above hypoglycemia is present mostly transiently in the afternoons      CGM use % of time   2-week average/GV 148  Time in range    74    %  % Time Above 180 22  % Time above 250   % Time Below 70 1     Dietician visit: Most recent: 03/2020    Weight control:  Wt Readings from Last 3 Encounters:  08/30/22 237 lb 9.6 oz (107.8 kg)  08/15/22 239 lb (108.4 kg)  06/28/22 239 lb (108.4 kg)            Diabetes labs:  Lab Results  Component Value Date   HGBA1C 8.1 (H) 06/26/2022   HGBA1C 8.0 (H) 03/24/2022   HGBA1C 7.7 (H) 02/20/2022   Lab Results  Component Value Date   MICROALBUR <0.7 09/21/2021   LDLCALC 53 09/21/2021   CREATININE 1.33 08/28/2022   Lab Results  Component Value Date   FRUCTOSAMINE 271 08/28/2022   FRUCTOSAMINE 261 12/15/2021  Allergies as of 08/30/2022   No Known Allergies      Medication List        Accurate as of August 30, 2022  2:53 PM. If you have any questions, ask your nurse or doctor.          allopurinol 100 MG tablet Commonly known as: ZYLOPRIM TAKE 1 TABLET BY MOUTH TWICE A DAY   B-D ULTRAFINE III SHORT PEN 31G X 8 MM Misc Generic drug: Insulin Pen Needle USE TWICE A DAY   COQ-10 PO Take 1 tablet by mouth 2 (two) times daily.   cyclobenzaprine 5 MG tablet Commonly known as: FLEXERIL TAKE 1 TABLET BY MOUTH THREE TIMES A DAY AS NEEDED FOR MUSCLE SPASMS   dapagliflozin propanediol 10 MG Tabs tablet Commonly known as: Farxiga TAKE 1 TABLET BY MOUTH EVERY DAY BEFORE BREAKFAST   Dexcom G7 Sensor Misc 1 Device by Does not apply route as directed. Change sensor every 10 days    diclofenac Sodium 1 % Gel Commonly known as: VOLTAREN Apply 2 g topically 4 (four) times daily. What changed:  when to take this reasons to take this   ferrous sulfate 325 (65 FE) MG EC tablet Take 1 tablet (325 mg total) by mouth 2 (two) times daily.   gabapentin 100 MG capsule Commonly known as: NEURONTIN Take 2 capsules (200 mg total) by mouth at bedtime. What changed: how much to take   glipiZIDE 10 MG 24 hr tablet Commonly known as: GLUCOTROL XL TAKE 1 TABLET BY MOUTH TWICE A DAY   HumaLOG KwikPen 100 UNIT/ML KwikPen Generic drug: insulin lispro INJECT 10 UNITS INTO THE SKIN 3 (THREE) TIMES DAILY.   HumuLIN N 100 UNIT/ML injection Generic drug: insulin NPH Human Inject up to 30 units in morning and 40 units at bedtime   hydrochlorothiazide 25 MG tablet Commonly known as: HYDRODIURIL TAKE 1 TABLET BY MOUTH EVERY DAY   indomethacin 25 MG capsule Commonly known as: INDOCIN TAKE 1 CAPSULE BY MOUTH TWICE A DAY AS NEEDED   lisinopril 10 MG tablet Commonly known as: ZESTRIL Take 1 tablet (10 mg total) by mouth daily.   lovastatin 40 MG tablet Commonly known as: MEVACOR Take 1 tablet (40 mg total) by mouth at bedtime.   meloxicam 15 MG tablet Commonly known as: MOBIC TAKE 1 TABLET BY MOUTH EVERY DAY AS NEEDED FOR PAIN   metFORMIN 1000 MG tablet Commonly known as: GLUCOPHAGE TAKE 1 TABLET BY MOUTH TWICE A DAY WITH FOOD   OVER THE COUNTER MEDICATION Take 1 tablet by mouth 2 (two) times daily. Beet Root supplement   pantoprazole 40 MG tablet Commonly known as: PROTONIX Take 1 tablet (40 mg total) by mouth daily.   solifenacin 5 MG tablet Commonly known as: VESIcare Take 1 tablet (5 mg total) by mouth daily.   tamsulosin 0.4 MG Caps capsule Commonly known as: FLOMAX Take 1 capsule (0.4 mg total) by mouth daily.   traMADol 50 MG tablet Commonly known as: ULTRAM TAKE 1 TABLET BY MOUTH EVERY 6 HOURS AS NEEDED        Allergies: No Known  Allergies  Past Medical History:  Diagnosis Date   Chicken pox    Diabetes mellitus without complication    Essential hypertension    GERD (gastroesophageal reflux disease)    Gouty arthritis    Hypertension    Iron deficiency anemia    Vitamin D deficiency     Past Surgical History:  Procedure Laterality Date  BIOPSY  12/05/2021   Procedure: BIOPSY;  Surgeon: Doran Stabler, MD;  Location: [facility];  Service: Gastroenterology;;   ESOPHAGOGASTRODUODENOSCOPY N/A 12/05/2021   Procedure: ESOPHAGOGASTRODUODENOSCOPY (EGD);  Surgeon: Doran Stabler, MD;  Location: [facility];  Service: Gastroenterology;  Laterality: N/A;   HOT HEMOSTASIS N/A 12/05/2021   Procedure: HOT HEMOSTASIS (ARGON PLASMA COAGULATION/BICAP);  Surgeon: Doran Stabler, MD;  Location: [facility];  Service: Gastroenterology;  Laterality: N/A;   INGUINAL HERNIA REPAIR Right 12/19/2017   Procedure: HERNIA REPAIR INGUINAL RIGHT;  Surgeon: Coralie Keens, MD;  Location: [facility];  Service: General;  Laterality: Right;   INSERTION OF MESH Right 12/19/2017   Procedure: INSERTION OF MESH;  Surgeon: Coralie Keens, MD;  Location: [facility];  Service: General;  Laterality: Right;   MASS EXCISION Right 12/19/2017   Procedure: EXCISION OF RIGHT SCROTAL MASS;  Surgeon: Coralie Keens, MD;  Location: [facility];  Service: General;  Laterality: Right;   TONSILLECTOMY      Family History  Problem Relation Age of Onset   Heart disease Mother    Hypertension Mother    Diabetes Mother    Stroke Father    Hypertension Father    Stroke Brother     Social History:  reports that he has quit smoking. His smoking use included cigarettes. He has a 50.00 pack-year smoking history. He has quit using smokeless tobacco.  His smokeless tobacco use included chew. He reports that he does not currently use alcohol. He reports that he does not use drugs.  Review of Systems:  Last diabetic eye exam date: 2021?  Last foot exam  date:4/23 No symptoms of numbness in the feet   Microalbumin normal as of 4/23  Hypertension: Treated by PCP with HCTZ and lisinopril Not clear if he is taking HCTZ regularly  BP Readings from Last 3 Encounters:  08/30/22 108/68  08/15/22 116/68  06/28/22 120/72    Lipids: Controlled with lovastatin 40 mg prescribed by Dr. Jenny Reichmann    Lab Results  Component Value Date   CHOL 133 03/24/2022   CHOL 123 09/21/2021   CHOL 140 03/23/2021   Lab Results  Component Value Date   HDL 34.00 (L) 03/24/2022   HDL 32.70 (L) 09/21/2021   HDL 35.80 (L) 03/23/2021   Lab Results  Component Value Date   LDLCALC 53 09/21/2021   LDLCALC 73 03/23/2021   LDLCALC 68 05/18/2020   Lab Results  Component Value Date   TRIG 239.0 (H) 03/24/2022   TRIG 188.0 (H) 09/21/2021   TRIG 155.0 (H) 03/23/2021   Lab Results  Component Value Date   CHOLHDL 4 03/24/2022   CHOLHDL 4 09/21/2021   CHOLHDL 4 03/23/2021   Lab Results  Component Value Date   LDLDIRECT 78.0 03/24/2022     Examination:   BP 108/68 (BP Location: Left Arm, Patient Position: Sitting, Cuff Size: Normal)   Pulse 74   Ht 5\' 8"  (1.727 m)   Wt 237 lb 9.6 oz (107.8 kg)   SpO2 95%   BMI 36.13 kg/m   Body mass index is 36.13 kg/m.    ASSESSMENT/ PLAN:    Diabetes type 2:   Current regimen: NPH insulin twice a day, Humalog at breakfast and dinner Farxiga 10 mg, and metformin  A1c is last high at 8.1, fructosamine relatively better at 271  Difficult to assess his recent control because of lack of monitoring However Dexcom data was available from early February and likely has the  same blood sugar patterns now  Likely has better readings overnight with increasing his NPH at night However has inadequate Humalog at least at breakfast time when he is only taking 11 units an average blood sugar in the low 200s after meals in the morning Also likely getting higher dose of NPH that he needs for the afternoon Blood sugars after  dinner are mostly high because of drinking regular Cokes  Recommendations:  He will see the diabetes educator today to review the application technique of the Dexcom and a sample was given and started in the office New insulin doses: N INSULIN 22 IN AM AND 35 AT BEDTIME  Humalog 15-16 before breakfast and 12-13 at supper  Limit cokes to 12 oz  Needs to do some regular walking for exercise Continue Farxiga or metformin  HYPERTENSION: Blood pressure is well-controlled   Patient Instructions  N INSULIN 22 IN AM AND 35 AT BEDTIME  Humalog 15-16 before breakfast and 12-13 at supper  Limit cokes to 12 oz  Total visit time for evaluation and management and counseling = 30 minutes  Elayne Snare 08/30/2022, 2:53 PM

## 2022-09-06 ENCOUNTER — Other Ambulatory Visit: Payer: Self-pay | Admitting: Endocrinology

## 2022-09-06 DIAGNOSIS — E1165 Type 2 diabetes mellitus with hyperglycemia: Secondary | ICD-10-CM

## 2022-09-07 ENCOUNTER — Encounter: Payer: Self-pay | Admitting: Endocrinology

## 2022-09-07 ENCOUNTER — Other Ambulatory Visit: Payer: Self-pay | Admitting: Internal Medicine

## 2022-09-07 DIAGNOSIS — E538 Deficiency of other specified B group vitamins: Secondary | ICD-10-CM

## 2022-09-07 DIAGNOSIS — E559 Vitamin D deficiency, unspecified: Secondary | ICD-10-CM

## 2022-09-07 DIAGNOSIS — E1142 Type 2 diabetes mellitus with diabetic polyneuropathy: Secondary | ICD-10-CM

## 2022-09-07 DIAGNOSIS — Z125 Encounter for screening for malignant neoplasm of prostate: Secondary | ICD-10-CM

## 2022-09-08 ENCOUNTER — Other Ambulatory Visit: Payer: Self-pay | Admitting: Internal Medicine

## 2022-09-12 ENCOUNTER — Telehealth: Payer: Self-pay | Admitting: Internal Medicine

## 2022-09-12 MED ORDER — TRAMADOL HCL 50 MG PO TABS: 50.0000 mg | ORAL_TABLET | Freq: Four times a day (QID) | ORAL | 2 refills | Status: DC | PRN

## 2022-09-12 NOTE — Telephone Encounter (Signed)
Prescription Request  09/12/2022  LOV: 03/24/2022  What is the name of the medication or equipment? traMADol (ULTRAM) 50 MG tablet   Have you contacted your pharmacy to request a refill? No   Which pharmacy would you like this sent to?     [id number] - [place], [place] - [address] AT [facility] OF [address] [place] [address] Phone: [phone] Fax: [phone]  Patient notified that their request is being sent to the clinical staff for review and that they should receive a response within 2 business days.   Please advise at Mobile [phone] (mobile)

## 2022-09-12 NOTE — Telephone Encounter (Signed)
Done erx 

## 2022-09-19 ENCOUNTER — Telehealth: Payer: Self-pay

## 2022-09-19 ENCOUNTER — Other Ambulatory Visit (INDEPENDENT_AMBULATORY_CARE_PROVIDER_SITE_OTHER): Payer: Commercial Managed Care - HMO

## 2022-09-19 DIAGNOSIS — Z125 Encounter for screening for malignant neoplasm of prostate: Secondary | ICD-10-CM

## 2022-09-19 DIAGNOSIS — E538 Deficiency of other specified B group vitamins: Secondary | ICD-10-CM

## 2022-09-19 DIAGNOSIS — E1142 Type 2 diabetes mellitus with diabetic polyneuropathy: Secondary | ICD-10-CM

## 2022-09-19 DIAGNOSIS — E559 Vitamin D deficiency, unspecified: Secondary | ICD-10-CM | POA: Diagnosis not present

## 2022-09-19 LAB — BASIC METABOLIC PANEL
BUN: 20 mg/dL (ref 6–23)
CO2: 25 mEq/L (ref 19–32)
Calcium: 9.4 mg/dL (ref 8.4–10.5)
Chloride: 102 mEq/L (ref 96–112)
Creatinine, Ser: 1.39 mg/dL (ref 0.40–1.50)
GFR: 53.69 mL/min — ABNORMAL LOW (ref >60.00)
Glucose, Bld: 141 mg/dL — ABNORMAL HIGH (ref 70–99)
Potassium: 4.3 mEq/L (ref 3.5–5.1)
Sodium: 135 mEq/L (ref 135–145)

## 2022-09-19 LAB — CBC WITH DIFFERENTIAL/PLATELET
Basophils Absolute: 0 10*3/uL (ref 0.0–0.1)
Basophils Relative: 0.3 % (ref 0.0–3.0)
Eosinophils Absolute: 0.2 10*3/uL (ref 0.0–0.7)
Eosinophils Relative: 2 % (ref 0.0–5.0)
HCT: 40.7 % (ref 39.0–52.0)
Hemoglobin: 13.1 g/dL (ref 13.0–17.0)
Lymphocytes Relative: 35.8 % (ref 12.0–46.0)
Lymphs Abs: 2.8 10*3/uL (ref 0.7–4.0)
MCHC: 32.1 g/dL (ref 30.0–36.0)
MCV: 79.8 fl (ref 78.0–100.0)
Monocytes Absolute: 0.6 10*3/uL (ref 0.1–1.0)
Monocytes Relative: 7.1 % (ref 3.0–12.0)
Neutro Abs: 4.3 10*3/uL (ref 1.4–7.7)
Neutrophils Relative %: 54.8 % (ref 43.0–77.0)
Platelets: 316 10*3/uL (ref 150.0–400.0)
RBC: 5.1 Mil/uL (ref 4.22–5.81)
RDW: 18 % — ABNORMAL HIGH (ref 11.5–15.5)
WBC: 7.9 10*3/uL (ref 4.0–10.5)

## 2022-09-19 LAB — URINALYSIS, ROUTINE W REFLEX MICROSCOPIC
Bilirubin Urine: NEGATIVE
Hgb urine dipstick: NEGATIVE
Ketones, ur: NEGATIVE
Leukocytes,Ua: NEGATIVE
Nitrite: NEGATIVE
RBC / HPF: NONE SEEN (ref 0–?)
Specific Gravity, Urine: 1.025 (ref 1.000–1.030)
Total Protein, Urine: NEGATIVE
Urine Glucose: >=1000 — AB
Urobilinogen, UA: 0.2 (ref 0.0–1.0)
WBC, UA: NONE SEEN (ref 0–?)
pH: 5.5 (ref 5.0–8.0)

## 2022-09-19 LAB — HEPATIC FUNCTION PANEL
ALT: 26 U/L (ref 0–53)
AST: 15 U/L (ref 0–37)
Albumin: 4.2 g/dL (ref 3.5–5.2)
Alkaline Phosphatase: 61 U/L (ref 39–117)
Bilirubin, Direct: 0.1 mg/dL (ref 0.0–0.3)
Total Bilirubin: 0.3 mg/dL (ref 0.2–1.2)
Total Protein: 6.6 g/dL (ref 6.0–8.3)

## 2022-09-19 LAB — LIPID PANEL
Cholesterol: 137 mg/dL (ref 0–200)
HDL: 36.2 mg/dL — ABNORMAL LOW (ref >39.00)
LDL Cholesterol: 64 mg/dL (ref 0–99)
NonHDL: 101.07
Total CHOL/HDL Ratio: 4
Triglycerides: 185 mg/dL — ABNORMAL HIGH (ref 0.0–149.0)
VLDL: 37 mg/dL (ref 0.0–40.0)

## 2022-09-19 LAB — MICROALBUMIN / CREATININE URINE RATIO
Creatinine,U: 92.5 mg/dL
Microalb Creat Ratio: 0.8 mg/g (ref 0.0–30.0)
Microalb, Ur: <0.7 mg/dL (ref 0.0–1.9)

## 2022-09-19 LAB — HEMOGLOBIN A1C: Hgb A1c MFr Bld: 8.5 % — ABNORMAL HIGH (ref 4.6–6.5)

## 2022-09-19 LAB — TSH: TSH: 2.05 u[IU]/mL (ref 0.35–5.50)

## 2022-09-19 LAB — VITAMIN B12: Vitamin B-12: 331 pg/mL (ref 211–911)

## 2022-09-19 LAB — VITAMIN D 25 HYDROXY (VIT D DEFICIENCY, FRACTURES): VITD: >120 ng/mL

## 2022-09-19 LAB — PSA: PSA: 0.5 ng/mL (ref 0.10–4.00)

## 2022-09-21 ENCOUNTER — Telehealth: Payer: Self-pay

## 2022-09-21 DIAGNOSIS — E1165 Type 2 diabetes mellitus with hyperglycemia: Secondary | ICD-10-CM

## 2022-09-21 MED ORDER — DAPAGLIFLOZIN PROPANEDIOL 10 MG PO TABS
10.0000 mg | ORAL_TABLET | Freq: Every day | ORAL | 3 refills | Status: DC
Start: 2022-09-21 — End: 2023-02-15

## 2022-09-21 NOTE — Telephone Encounter (Signed)
Requested Prescriptions   Signed Prescriptions Disp Refills   dapagliflozin propanediol (FARXIGA) 10 MG TABS tablet 90 tablet 3    Sig: Take 1 tablet (10 mg total) by mouth daily.    Authorizing Provider: Reather Littler    Ordering User: Valorie Roosevelt S    Refill Sent.

## 2022-09-21 NOTE — Telephone Encounter (Signed)
Patient called requesting a refill on Farxiga.  The last rx that was sent in was Farxiga 5 mg take 2 tablets by mouth daily.Patient states he has not been taking it that way, he has only been taking one tablet. I saw in his chart he was on 10 mg tablets and not 5 mg. Can you please advise,

## 2022-09-26 ENCOUNTER — Ambulatory Visit (INDEPENDENT_AMBULATORY_CARE_PROVIDER_SITE_OTHER): Payer: Commercial Managed Care - HMO | Admitting: Internal Medicine

## 2022-09-26 ENCOUNTER — Encounter: Payer: Self-pay | Admitting: Internal Medicine

## 2022-09-26 VITALS — BP 130/78 | HR 75 | Temp 97.8°F | Ht 68.0 in | Wt 237.0 lb

## 2022-09-26 DIAGNOSIS — E7800 Pure hypercholesterolemia, unspecified: Secondary | ICD-10-CM

## 2022-09-26 DIAGNOSIS — I10 Essential (primary) hypertension: Secondary | ICD-10-CM

## 2022-09-26 DIAGNOSIS — R351 Nocturia: Secondary | ICD-10-CM | POA: Diagnosis not present

## 2022-09-26 DIAGNOSIS — Z0001 Encounter for general adult medical examination with abnormal findings: Secondary | ICD-10-CM

## 2022-09-26 DIAGNOSIS — E559 Vitamin D deficiency, unspecified: Secondary | ICD-10-CM | POA: Diagnosis not present

## 2022-09-26 DIAGNOSIS — Z794 Long term (current) use of insulin: Secondary | ICD-10-CM

## 2022-09-26 DIAGNOSIS — E1142 Type 2 diabetes mellitus with diabetic polyneuropathy: Secondary | ICD-10-CM | POA: Diagnosis not present

## 2022-09-26 DIAGNOSIS — G4710 Hypersomnia, unspecified: Secondary | ICD-10-CM

## 2022-09-26 DIAGNOSIS — D509 Iron deficiency anemia, unspecified: Secondary | ICD-10-CM

## 2022-09-26 DIAGNOSIS — Z7984 Long term (current) use of oral hypoglycemic drugs: Secondary | ICD-10-CM

## 2022-09-26 MED ORDER — CYCLOBENZAPRINE HCL 5 MG PO TABS: ORAL_TABLET | ORAL | 5 refills | Status: DC

## 2022-09-26 MED ORDER — LISINOPRIL 5 MG PO TABS: 5.0000 mg | ORAL_TABLET | Freq: Every day | ORAL | 3 refills | Status: DC

## 2022-09-26 NOTE — Progress Notes (Addendum)
Patient ID: Steven [name], male   DOB: 25-Nov-1960, 62 y.o.   MRN: [id number]         Chief Complaint:: wellness exam and nocturia, htn, dm, hld, low vit d       HPI:  Steven [name] is a 62 y.o. male here for wellness exam; pt states will call for eye exam himself, for shingrix at pharmacy,  o/w up to date                        Also Pt denies chest pain, increased sob or doe, wheezing, orthopnea, PND, increased LE swelling, palpitations, dizziness or syncope.   Pt denies polydipsia, polyuria, or new focal neuro s/s.    Pt denies fever, wt loss, night sweats, loss of appetite, or other constitutional symptoms  Denies urinary symptoms such as dysuria, frequency, urgency, flank pain, hematuria or n/v, fever, chills, but does have several nocturia per night, admits to drinking more fluids before bed recently.  Seens Endo Dr Lucianne Muss for DM but will be retiring soon.  Now taking lisinopril 5 mg per endo recently.     Wt Readings from Last 3 Encounters:  09/26/22 237 lb (107.5 kg)  08/30/22 237 lb 9.6 oz (107.8 kg)  08/15/22 239 lb (108.4 kg)   BP Readings from Last 3 Encounters:  09/26/22 130/78  08/30/22 108/68  08/15/22 116/68   Immunization History  Administered Date(s) Administered   Influenza,inj,Quad PF,6+ Mos 06/11/2017, 03/15/2018, 05/18/2020, 03/23/2021, 03/24/2022   Pneumococcal Polysaccharide-23 07/02/2015, 03/23/2021   Tdap 03/24/2022   Health Maintenance Due  Topic Date Due   Lung Cancer Screening  02/23/2009   OPHTHALMOLOGY EXAM  09/20/2022      Past Medical History:  Diagnosis Date   Chicken pox    Diabetes mellitus without complication (HCC)    Essential hypertension    GERD (gastroesophageal reflux disease)    Gouty arthritis    Hypertension    Iron deficiency anemia    Vitamin D deficiency    Past Surgical History:  Procedure Laterality Date   BIOPSY  12/05/2021   Procedure: BIOPSY;  Surgeon: Sherrilyn Rist, MD;  Location: [facility];  Service:  Gastroenterology;;   ESOPHAGOGASTRODUODENOSCOPY N/A 12/05/2021   Procedure: ESOPHAGOGASTRODUODENOSCOPY (EGD);  Surgeon: Sherrilyn Rist, MD;  Location: [facility];  Service: Gastroenterology;  Laterality: N/A;   HOT HEMOSTASIS N/A 12/05/2021   Procedure: HOT HEMOSTASIS (ARGON PLASMA COAGULATION/BICAP);  Surgeon: Sherrilyn Rist, MD;  Location: [facility];  Service: Gastroenterology;  Laterality: N/A;   INGUINAL HERNIA REPAIR Right 12/19/2017   Procedure: HERNIA REPAIR INGUINAL RIGHT;  Surgeon: Abigail Miyamoto, MD;  Location: [facility];  Service: General;  Laterality: Right;   INSERTION OF MESH Right 12/19/2017   Procedure: INSERTION OF MESH;  Surgeon: Abigail Miyamoto, MD;  Location: [facility];  Service: General;  Laterality: Right;   MASS EXCISION Right 12/19/2017   Procedure: EXCISION OF RIGHT SCROTAL MASS;  Surgeon: Abigail Miyamoto, MD;  Location: [facility];  Service: General;  Laterality: Right;   TONSILLECTOMY      reports that he has quit smoking. His smoking use included cigarettes. He has a 50.00 pack-year smoking history. He has quit using smokeless tobacco.  His smokeless tobacco use included chew. He reports that he does not currently use alcohol. He reports that he does not use drugs. family history includes Diabetes in his mother; Heart disease in his mother; Hypertension in his father and mother; Stroke in  his brother and father. No Known Allergies Current Outpatient Medications on File Prior to Visit  Medication Sig Dispense Refill   allopurinol (ZYLOPRIM) 100 MG tablet TAKE 1 TABLET BY MOUTH TWICE A DAY 180 tablet 3   Coenzyme Q10 (COQ-10 PO) Take 1 tablet by mouth 2 (two) times daily.     dapagliflozin propanediol (FARXIGA) 10 MG TABS tablet Take 1 tablet (10 mg total) by mouth daily. 90 tablet 3   diclofenac Sodium (VOLTAREN) 1 % GEL Apply 2 g topically 4 (four) times daily. (Patient taking differently: Apply 2 g topically 4 (four) times daily as needed (pain).) 50 g 0    ferrous sulfate 325 (65 FE) MG EC tablet Take 1 tablet (325 mg total) by mouth 2 (two) times daily. 60 tablet 0   gabapentin (NEURONTIN) 100 MG capsule Take 2 capsules (200 mg total) by mouth at bedtime. (Patient taking differently: Take 100 mg by mouth at bedtime.) 180 capsule 3   glipiZIDE (GLUCOTROL XL) 10 MG 24 hr tablet TAKE 1 TABLET BY MOUTH TWICE A DAY 60 tablet 4   HUMULIN N 100 UNIT/ML injection Inject up to 30 units in morning and 40 units at bedtime 70 mL 3   hydrochlorothiazide (HYDRODIURIL) 25 MG tablet TAKE 1 TABLET BY MOUTH EVERY DAY 90 tablet 3   indomethacin (INDOCIN) 25 MG capsule TAKE 1 CAPSULE BY MOUTH TWICE A DAY AS NEEDED 180 capsule 1   insulin lispro (HUMALOG KWIKPEN) 100 UNIT/ML KwikPen INJECT 10 UNITS INTO THE SKIN 3 (THREE) TIMES DAILY. 15 mL 1   Insulin Pen Needle (B-D ULTRAFINE III SHORT PEN) 31G X 8 MM MISC USE TWICE A DAY 100 each 1   lovastatin (MEVACOR) 40 MG tablet Take 1 tablet (40 mg total) by mouth at bedtime. 90 tablet 3   meloxicam (MOBIC) 15 MG tablet TAKE 1 TABLET BY MOUTH EVERY DAY AS NEEDED FOR PAIN 30 tablet 5   metFORMIN (GLUCOPHAGE) 1000 MG tablet TAKE 1 TABLET BY MOUTH TWICE A DAY WITH FOOD 180 tablet 3   OVER THE COUNTER MEDICATION Take 1 tablet by mouth 2 (two) times daily. Beet Root supplement     pantoprazole (PROTONIX) 40 MG tablet Take 1 tablet (40 mg total) by mouth daily. 90 tablet 3   solifenacin (VESICARE) 5 MG tablet Take 1 tablet (5 mg total) by mouth daily. 90 tablet 3   tamsulosin (FLOMAX) 0.4 MG CAPS capsule Take 1 capsule (0.4 mg total) by mouth daily. 30 capsule 11   traMADol (ULTRAM) 50 MG tablet Take 1 tablet (50 mg total) by mouth every 6 (six) hours as needed. 120 tablet 2   Continuous Blood Gluc Sensor (DEXCOM G7 SENSOR) MISC 1 Device by Does not apply route as directed. Change sensor every 10 days (Patient not taking: Reported on 06/28/2022) 3 each 3   No current facility-administered medications on file prior to visit.         ROS:  All others reviewed and negative.  Objective        PE:  BP 130/78 (BP Location: Right Arm, Patient Position: Sitting, Cuff Size: Normal)   Pulse 75   Temp 97.8 F (36.6 C) (Oral)   Ht 5\' 8"  (1.727 m)   Wt 237 lb (107.5 kg)   SpO2 98%   BMI 36.04 kg/m                 Constitutional: Pt appears in NAD  HENT: Head: NCAT.                Right Ear: External ear normal.                 Left Ear: External ear normal.                Eyes: . Pupils are equal, round, and reactive to light. Conjunctivae and EOM are normal               Nose: without d/c or deformity               Neck: Neck supple. Gross normal ROM               Cardiovascular: Normal rate and regular rhythm.                 Pulmonary/Chest: Effort normal and breath sounds without rales or wheezing.                Abd:  Soft, NT, ND, + BS, no organomegaly               Neurological: Pt is alert. At baseline orientation, motor grossly intact               Skin: Skin is warm. No rashes, no other new lesions, LE edema - none               Psychiatric: Pt behavior is normal without agitation   Micro: none  Cardiac tracings I have personally interpreted today:  none  Pertinent Radiological findings (summarize): none   Lab Results  Component Value Date   WBC 7.9 09/19/2022   HGB 13.1 09/19/2022   HCT 40.7 09/19/2022   PLT 316.0 09/19/2022   GLUCOSE 141 (H) 09/19/2022   CHOL 137 09/19/2022   TRIG 185.0 (H) 09/19/2022   HDL 36.20 (L) 09/19/2022   LDLDIRECT 78.0 03/24/2022   LDLCALC 64 09/19/2022   ALT 26 09/19/2022   AST 15 09/19/2022   NA 135 09/19/2022   K 4.3 09/19/2022   CL 102 09/19/2022   CREATININE 1.39 09/19/2022   BUN 20 09/19/2022   CO2 25 09/19/2022   TSH 2.05 09/19/2022   PSA 0.50 09/19/2022   HGBA1C 8.5 (H) 09/19/2022   MICROALBUR <0.7 09/19/2022   Assessment/Plan:  Steven [name] is a 62 y.o. Black or African American [2] male with  has a past medical history of Chicken pox,  Diabetes mellitus without complication (HCC), Essential hypertension, GERD (gastroesophageal reflux disease), Gouty arthritis, Hypertension, Iron deficiency anemia, and Vitamin D deficiency.  Encounter for well adult exam with abnormal findings Age and sex appropriate education and counseling updated with regular exercise and diet Referrals for preventative services - pt states will call soon for eye exam himself Immunizations addressed - for shingrix at pharmacy Smoking counseling  - none needed Evidence for depression or other mood disorder - none significant Most recent labs reviewed. I have personally reviewed and have noted: 1) the patient's medical and social history 2) The patient's current medications and supplements 3) The patient's height, weight, and BMI have been recorded in the chart   Diabetes [facility]) Lab Results  Component Value Date   HGBA1C 8.5 (H) 09/19/2022   Uncontrolled,pt declines any change in med tx for now, will finish with Dr Lucian[facility] Muss endo soon then plans to transition to new endo so pt to continue current medical treatment farxiga 10 qd, glucotrol xl 10 qd, humulin 30  u qam, 40 u qpm, and humalog 10 u tid ac, metformin 1000 bid   Essential hypertension Lab Results  Component Value Date   HGBA1C 8.5 (H) 09/19/2022   Stable with recent addition of lisinopril 5 qd per endo, pt to continue current medical treatment hct 25 mg as well   HLD (hyperlipidemia) Lab Results  Component Value Date   LDLCALC 64 09/19/2022   Stable, pt to continue current statin lovastatin 40 mg qd   Iron deficiency anemia No recent overt bleeding, for f/u iron lab  Vitamin D deficiency Last vitamin D Lab Results  Component Value Date   VD25OH >120 09/19/2022   Overcontrolled, ok to decrease oral replacement to 2000 u qd   Nocturia D/w pt, possible related to bph but should try reduced oral fluids before bedtime to start, consider flomax trial if not improved or urology  referral Followup: Return in about 6 months (around 03/28/2023).  Steven Barre, MD 09/29/2022 3:55 PM [facility]

## 2022-09-26 NOTE — Patient Instructions (Signed)
Please remember to call for your yearly eye exam  Ok to try to cut back on the sugary Coke before bedtime  Please continue all other medications as before, and refills have been done if requested.  Please have the pharmacy call with any other refills you may need.  Please continue your efforts at being more active, low cholesterol diet, and weight control.  You are otherwise up to date with prevention measures today.  Please keep your appointments with your specialists as you may have planned - the new endocrinologist soon  Please make an Appointment to return in 6 months, or sooner if needed, also with Lab Appointment for testing done 3-5 days before at the FIRST FLOOR Lab (so this is for TWO appointments - please see the scheduling desk as you leave)

## 2022-09-26 NOTE — Telephone Encounter (Signed)
Previously routed to provider and addressed

## 2022-09-29 ENCOUNTER — Encounter: Payer: Self-pay | Admitting: Internal Medicine

## 2022-09-29 DIAGNOSIS — R351 Nocturia: Secondary | ICD-10-CM | POA: Insufficient documentation

## 2022-09-29 NOTE — Assessment & Plan Note (Signed)
No recent overt bleeding, for f/u iron lab 

## 2022-09-29 NOTE — Assessment & Plan Note (Signed)
Lab Results  Component Value Date   HGBA1C 8.5 (H) 09/19/2022   Stable with recent addition of lisinopril 5 qd per endo, pt to continue current medical treatment hct 25 mg as well

## 2022-09-29 NOTE — Assessment & Plan Note (Signed)
D/w pt, possible related to bph but should try reduced oral fluids before bedtime to start, consider flomax trial if not improved or urology referral

## 2022-09-29 NOTE — Addendum Note (Signed)
Addended by: Corwin Levins on: 09/29/2022 03:55 PM   Modules accepted: Level of Service

## 2022-09-29 NOTE — Assessment & Plan Note (Addendum)
Last vitamin D Lab Results  Component Value Date   VD25OH >120 09/19/2022   Overcontrolled, ok to decrease oral replacement to 2000 u qd

## 2022-09-29 NOTE — Assessment & Plan Note (Signed)
Lab Results  Component Value Date   HGBA1C 8.5 (H) 09/19/2022   Uncontrolled,pt declines any change in med tx for now, will finish with Dr [facility] M[facility] endo soon then plans to transition to new endo so pt to continue current medical treatment farxiga 10 qd, glucotrol xl 10 qd, humulin 30 u qam, 40 u qpm, and humalog 10 u tid ac, metformin 1000 bid

## 2022-09-29 NOTE — Assessment & Plan Note (Signed)
Lab Results  Component Value Date   LDLCALC 64 09/19/2022   Stable, pt to continue current statin lovastatin 40 mg qd

## 2022-09-29 NOTE — Assessment & Plan Note (Signed)
Age and sex appropriate education and counseling updated with regular exercise and diet Referrals for preventative services - pt states will call soon for eye exam himself Immunizations addressed - for shingrix at pharmacy Smoking counseling  - none needed Evidence for depression or other mood disorder - none significant Most recent labs reviewed. I have personally reviewed and have noted: 1) the patient's medical and social history 2) The patient's current medications and supplements 3) The patient's height, weight, and BMI have been recorded in the chart

## 2022-10-04 ENCOUNTER — Other Ambulatory Visit: Payer: Self-pay | Admitting: Internal Medicine

## 2022-10-15 ENCOUNTER — Other Ambulatory Visit: Payer: Self-pay | Admitting: Endocrinology

## 2022-10-17 ENCOUNTER — Other Ambulatory Visit: Payer: Self-pay | Admitting: Internal Medicine

## 2022-10-17 ENCOUNTER — Other Ambulatory Visit: Payer: Self-pay

## 2022-10-17 ENCOUNTER — Other Ambulatory Visit: Payer: Self-pay | Admitting: Endocrinology

## 2022-10-17 DIAGNOSIS — M109 Gout, unspecified: Secondary | ICD-10-CM

## 2022-11-13 NOTE — Progress Notes (Unsigned)
[facility] [address] [place] [address] Phone: [phone] Subjective:   Steven [name], am serving as a scribe for Dr. Antoine Primas.  I'm seeing this patient by the request  of:  Corwin Levins, MD  CC: right sided knee pain   [id number]  08/15/2022 Chronic problem with worsening symptoms.  Patient is still ambulating using the cane at this time.  Discussed icing regimen and home exercises, discussed which activities to do and which ones to be used.  Increase activity slowly.  Follow-up again in 3 months.  Social determinants of health includes venous insufficiency and gout that could be contributing to some of the discomfort and pain.     Update 11/14/2022 Steven [name] is a 62 y.o. male coming in with complaint of R knee pain. Patient states that his pain has improved with use of custom brace. Patient wants to know what supplements he should be using. Inquires about Vit D and iron.        Past Medical History:  Diagnosis Date   Chicken pox    Diabetes mellitus without complication (HCC)    Essential hypertension    GERD (gastroesophageal reflux disease)    Gouty arthritis    Hypertension    Iron deficiency anemia    Vitamin D deficiency    Past Surgical History:  Procedure Laterality Date   BIOPSY  12/05/2021   Procedure: BIOPSY;  Surgeon: Sherrilyn Rist, MD;  Location: [facility];  Service: Gastroenterology;;   ESOPHAGOGASTRODUODENOSCOPY N/A 12/05/2021   Procedure: ESOPHAGOGASTRODUODENOSCOPY (EGD);  Surgeon: Sherrilyn Rist, MD;  Location: [facility];  Service: Gastroenterology;  Laterality: N/A;   HOT HEMOSTASIS N/A 12/05/2021   Procedure: HOT HEMOSTASIS (ARGON PLASMA COAGULATION/BICAP);  Surgeon: Sherrilyn Rist, MD;  Location: [facility];  Service: Gastroenterology;  Laterality: N/A;   INGUINAL HERNIA REPAIR Right 12/19/2017   Procedure: HERNIA REPAIR INGUINAL RIGHT;  Surgeon: Abigail Miyamoto, MD;  Location:  [facility];  Service: General;  Laterality: Right;   INSERTION OF MESH Right 12/19/2017   Procedure: INSERTION OF MESH;  Surgeon: Abigail Miyamoto, MD;  Location: [facility];  Service: General;  Laterality: Right;   MASS EXCISION Right 12/19/2017   Procedure: EXCISION OF RIGHT SCROTAL MASS;  Surgeon: Abigail Miyamoto, MD;  Location: [facility];  Service: General;  Laterality: Right;   TONSILLECTOMY     Social History   Socioeconomic History   Marital status: Divorced    Spouse name: Not on file   Number of children: 1   Years of education: 12   Highest education level: Not on file  Occupational History   Occupation: retired  Tobacco Use   Smoking status: Former    Packs/day: 2.00    Years: 25.00    Additional pack years: 0.00    Total pack years: 50.00    Types: Cigarettes   Smokeless tobacco: Former    Types: Associate Professor Use: Never used  Substance and Sexual Activity   Alcohol use: Not Currently   Drug use: No   Sexual activity: Yes    Birth control/protection: Condom  Other Topics Concern   Not on file  Social History Narrative   Fun: Just working   International aid/development worker of [employer] investment [employer] Strain: Not on file  Food Insecurity: Not on file  Transportation Needs: Not on file  Physical Activity: Not on file  Stress: Not on file  Social Connections:  Not on file   No Known Allergies Family History  Problem Relation Age of Onset   Heart disease Mother    Hypertension Mother    Diabetes Mother    Stroke Father    Hypertension Father    Stroke Brother     Current Outpatient Medications (Endocrine & Metabolic):    dapagliflozin propanediol (FARXIGA) 10 MG TABS tablet, Take 1 tablet (10 mg total) by mouth daily.   glipiZIDE (GLUCOTROL XL) 10 MG 24 hr tablet, TAKE 1 TABLET BY MOUTH TWICE A DAY   HUMULIN N 100 UNIT/ML injection, Inject up to 30 units in morning and 40 units at bedtime   insulin lispro (HUMALOG KWIKPEN) 100 UNIT/ML KwikPen, INJECT 10  UNITS INTO THE SKIN 3 (THREE) TIMES DAILY.   metFORMIN (GLUCOPHAGE) 1000 MG tablet, TAKE 1 TABLET BY MOUTH TWICE A DAY WITH FOOD  Current Outpatient Medications (Cardiovascular):    hydrochlorothiazide (HYDRODIURIL) 25 MG tablet, TAKE 1 TABLET BY MOUTH EVERY DAY   lisinopril (ZESTRIL) 5 MG tablet, Take 1 tablet (5 mg total) by mouth daily.   lovastatin (MEVACOR) 40 MG tablet, Take 1 tablet (40 mg total) by mouth at bedtime.   Current Outpatient Medications (Analgesics):    allopurinol (ZYLOPRIM) 100 MG tablet, TAKE 1 TABLET BY MOUTH TWICE A DAY   indomethacin (INDOCIN) 25 MG capsule, TAKE 1 CAPSULE BY MOUTH TWICE A DAY AS NEEDED   meloxicam (MOBIC) 15 MG tablet, TAKE 1 TABLET BY MOUTH EVERY DAY AS NEEDED FOR PAIN   traMADol (ULTRAM) 50 MG tablet, Take 1 tablet (50 mg total) by mouth every 6 (six) hours as needed.  Current Outpatient Medications (Hematological):    ferrous sulfate 324 (65 Fe) MG TBEC, Take 1 tablet (325 mg total) by mouth daily.  Current Outpatient Medications (Other):    Coenzyme Q10 (COQ-10 PO), Take 1 tablet by mouth 2 (two) times daily.   Continuous Blood Gluc Sensor (DEXCOM G7 SENSOR) MISC, 1 Device by Does not apply route as directed. Change sensor every 10 days   cyclobenzaprine (FLEXERIL) 5 MG tablet, TAKE 1 TABLET BY MOUTH THREE TIMES A DAY AS NEEDED FOR MUSCLE SPASMS   diclofenac Sodium (VOLTAREN) 1 % GEL, Apply 2 g topically 4 (four) times daily. (Patient taking differently: Apply 2 g topically 4 (four) times daily as needed (pain).)   gabapentin (NEURONTIN) 100 MG capsule, Take 2 capsules (200 mg total) by mouth at bedtime. (Patient taking differently: Take 100 mg by mouth at bedtime.)   Insulin Pen Needle (B-D ULTRAFINE III SHORT PEN) 31G X 8 MM MISC, USE AS DIRECTED TWICE A DAY   OVER THE COUNTER MEDICATION, Take 1 tablet by mouth 2 (two) times daily. Beet Root supplement   pantoprazole (PROTONIX) 40 MG tablet, Take 1 tablet (40 mg total) by mouth daily.    solifenacin (VESICARE) 5 MG tablet, Take 1 tablet (5 mg total) by mouth daily.   tamsulosin (FLOMAX) 0.4 MG CAPS capsule, Take 1 capsule (0.4 mg total) by mouth daily.   Reviewed prior external information including notes and imaging from  primary care provider As well as notes that were available from care everywhere and other healthcare systems.  Past medical history, social, surgical and family history all reviewed in electronic medical record.  No pertanent information unless stated regarding to the chief complaint.   Review of Systems:  No headache, visual changes, nausea, vomiting, diarrhea, constipation, dizziness, abdominal pain, skin rash, fevers, chills, night sweats, weight loss, swollen lymph nodes, body  aches, chest pain, shortness of breath, mood changes. POSITIVE muscle aches, joint swelling  Objective  Blood pressure 106/66, pulse 70, height 5\' 8"  (1.727 m), weight 238 lb (108 kg), SpO2 96 %.   General: No apparent distress alert and oriented x3 mood and affect normal, dressed appropriately.  HEENT: Pupils equal, extraocular movements intact  Respiratory: Patient's speak in full sentences and does not appear short of breath  Cardiovascular: No lower extremity edema, non tender, no erythema  Patient does have an antalgic gait noted.  Tenderness to palpation over the knee itself.  Trace effusion noted.  After informed written and verbal consent, patient was seated on exam table. Right knee was prepped with alcohol swab and utilizing anterolateral approach, patient's right knee space was injected with 4:1  marcaine 0.5%: Kenalog 40mg /dL. Patient tolerated the procedure well without immediate complications.    Impression and Recommendations:    The above documentation has been reviewed and is accurate and complete Judi Saa, DO

## 2022-11-14 ENCOUNTER — Encounter: Payer: Self-pay | Admitting: Family Medicine

## 2022-11-14 ENCOUNTER — Ambulatory Visit (INDEPENDENT_AMBULATORY_CARE_PROVIDER_SITE_OTHER): Payer: Commercial Managed Care - HMO | Admitting: Family Medicine

## 2022-11-14 VITALS — BP 106/66 | HR 70 | Ht 68.0 in | Wt 238.0 lb

## 2022-11-14 DIAGNOSIS — M1711 Unilateral primary osteoarthritis, right knee: Secondary | ICD-10-CM | POA: Diagnosis not present

## 2022-11-14 MED ORDER — FERROUS SULFATE 324 (65 FE) MG PO TBEC: 325.0000 mg | DELAYED_RELEASE_TABLET | Freq: Every day | ORAL | 0 refills | Status: AC

## 2022-11-14 NOTE — Assessment & Plan Note (Signed)
Repeat injection given today and tolerated the procedure well, discussed ice nodule exercises.  Discussed which activities to do more things to avoid patient is still doing well with the custom brace overall when it comes to the stability just more pain and swelling intermittently.  Patient still wants to avoid surgical intervention as long as possible.  Feels like he is going to be able to.  Follow-up with me again in 3 months.

## 2022-11-14 NOTE — Patient Instructions (Signed)
Injected knee today Iron sent in See me again in 4 months

## 2022-11-16 ENCOUNTER — Other Ambulatory Visit: Payer: Self-pay | Admitting: Internal Medicine

## 2022-11-16 ENCOUNTER — Other Ambulatory Visit: Payer: Self-pay

## 2022-11-16 DIAGNOSIS — M109 Gout, unspecified: Secondary | ICD-10-CM

## 2022-11-16 DIAGNOSIS — M25471 Effusion, right ankle: Secondary | ICD-10-CM

## 2022-11-29 ENCOUNTER — Other Ambulatory Visit: Payer: Self-pay | Admitting: Internal Medicine

## 2022-11-29 ENCOUNTER — Other Ambulatory Visit: Payer: Self-pay | Admitting: Endocrinology

## 2022-12-04 ENCOUNTER — Telehealth: Payer: Self-pay | Admitting: Family Medicine

## 2022-12-04 ENCOUNTER — Ambulatory Visit: Payer: 59 | Admitting: Nutrition

## 2022-12-04 ENCOUNTER — Ambulatory Visit (INDEPENDENT_AMBULATORY_CARE_PROVIDER_SITE_OTHER): Payer: Commercial Managed Care - HMO | Admitting: "Endocrinology

## 2022-12-04 ENCOUNTER — Encounter: Payer: Self-pay | Admitting: "Endocrinology

## 2022-12-04 VITALS — BP 120/65 | HR 47 | Ht 68.0 in | Wt 238.0 lb

## 2022-12-04 DIAGNOSIS — Z7984 Long term (current) use of oral hypoglycemic drugs: Secondary | ICD-10-CM

## 2022-12-04 DIAGNOSIS — E1165 Type 2 diabetes mellitus with hyperglycemia: Secondary | ICD-10-CM | POA: Diagnosis not present

## 2022-12-04 DIAGNOSIS — E782 Mixed hyperlipidemia: Secondary | ICD-10-CM

## 2022-12-04 DIAGNOSIS — Z794 Long term (current) use of insulin: Secondary | ICD-10-CM | POA: Diagnosis not present

## 2022-12-04 MED ORDER — BD PEN NEEDLE SHORT U/F 31G X 8 MM MISC: 2 refills | Status: DC

## 2022-12-04 MED ORDER — GVOKE HYPOPEN 1-PACK 1 MG/0.2ML ~~LOC~~ SOAJ
1.0000 mg | SUBCUTANEOUS | 2 refills | Status: DC | PRN
Start: 2022-12-04 — End: 2023-03-26

## 2022-12-04 NOTE — Telephone Encounter (Signed)
Yes ok to fill

## 2022-12-04 NOTE — Progress Notes (Signed)
Outpatient Endocrinology Note Steven [place], MD  12/04/22   Steven [name] 1961/01/29 [id number]  Referring Provider: Corwin Levins, MD Primary Care Provider: Corwin Levins, MD Reason for consultation: Subjective   Assessment & Plan  Steven [name] was seen today for diabetes.  Diagnoses and all orders for this visit:  Uncontrolled type 2 diabetes mellitus with hyperglycemia (HCC)  Long-term insulin use (HCC)  Long term (current) use of oral hypoglycemic drugs  Mixed hypercholesterolemia and hypertriglyceridemia  Other orders -     Insulin Pen Needle (B-D ULTRAFINE III SHORT PEN) 31G X 8 MM MISC; USE AS DIRECTED TWICE A DAY -     Glucagon (GVOKE HYPOPEN 1-PACK) 1 MG/0.2ML SOAJ; Inject 1 mg into the skin as needed (low blood sugar with imaopired consciousness).    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, neuropathy  Lab Results  Component Value Date   GFR 53.69 (L) 09/19/2022   Hba1c goal less than 7, current Hba1c is  Lab Results  Component Value Date   HGBA1C 8.5 (H) 09/19/2022   Currently on: Farxiga 10 mg daily, metformin 1 g twice daily, glipizide XL 10 mg bid  Insulin regimen: 25 NPH, am and 35 hs Humalog 15 units before break fast, forgets with lunch and dinner often  Unable to make dose adjustments due to lack of BG data Recommend checking BG before meals Recommended to sue skin tac to allow dexcom to stay put  No known contraindications to any of above medications Glucagon discussed and prescribed with refills on 12/04/22  -Last LD and Tg are as follows: Lab Results  Component Value Date   [facility] 62 09/19/2022    Lab Results  Component Value Date   TRIG 185.0 (H) 09/19/2022   -on lovastatin 40 mg QD -Follow low fat diet and exercise   -Blood pressure goal <140/90 - Microalbumin/creatinine goal < 30 -Last MA/Cr is as follows: Lab Results  Component Value Date   MICROALBUR <0.7 09/19/2022   -on ACE/ARB lisinopril 5mg  every day  -diet  changes including salt restriction -limit eating outside -counseled BP targets per standards of diabetes care -uncontrolled blood pressure can lead to retinopathy, nephropathy and cardiovascular and atherosclerotic heart disease  Reviewed and counseled on: -A1C target -Blood sugar targets -Complications of uncontrolled diabetes  -Checking blood sugar before meals and bedtime and bring log next visit -All medications with mechanism of action and side effects -Hypoglycemia management: rule of 15's, Glucagon Emergency Kit and medical alert ID -low-carb low-fat plate-method diet -At least 20 minutes of physical activity per day -Annual dilated retinal eye exam and foot exam -compliance and follow up needs -follow up as scheduled or earlier if problem gets worse  Call if blood sugar is less than 70 or consistently above 250    Take a 15 gm snack of carbohydrate at bedtime before you go to sleep if your blood sugar is less than 100.    If you are going to fast after midnight for a test or procedure, ask your physician for instructions on how to reduce/decrease your insulin dose.    Call if blood sugar is less than 70 or consistently above 250  -Treating a low sugar by rule of 15  (15 gms of sugar every 15 min until sugar is more than 70) If you feel your sugar is low, test your sugar to be sure If your sugar is low (less than 70), then take 15 grams of a fast acting Carbohydrate (3-4 glucose tablets  or glucose gel or 4 ounces of juice or regular soda) Recheck your sugar 15 min after treating low to make sure it is more than 70 If sugar is still less than 70, treat again with 15 grams of carbohydrate          Don't drive the hour of hypoglycemia  If unconscious/unable to eat or drink by mouth, use glucagon injection or nasal spray baqsimi and call 911. Can repeat again in 15 min if still unconscious.  Return in about 3 months (around 03/07/2023).   I have reviewed current medications,  nurse's notes, allergies, vital signs, past medical and surgical history, family medical history, and social history for this encounter. Counseled patient on symptoms, examination findings, lab findings, imaging results, treatment decisions and monitoring and prognosis. The patient understood the recommendations and agrees with the treatment plan. All questions regarding treatment plan were fully answered.  Steven Chenega, MD  12/04/22    History of Present Illness Steven [name] is a 62 y.o. year old male who presents for evaluation of Type II diabetes mellitus.  Steven [name] was first diagnosed in 2010.   Diabetes education +  Home diabetes regimen: Farxiga 10 mg daily, metformin 1 g twice daily, glipizide XL 10 mg bid  Insulin regimen: 25 NPH, am and 35 hs Humalog 15 units before break fast  Previous history:     Previously on metformin and started on insulin in 10/21 He has only been on NPH A1c range in the last few years: 8.1-12.1  COMPLICATIONS -  MI/Stroke -  retinopathy +  neuropathy +  nephropathy  BLOOD SUGAR DATA Doesn't check BG   Physical Exam  BP 120/65   Pulse (!) 47   Ht 5\' 8"  (1.727 m)   Wt 238 lb (108 kg)   SpO2 98%   BMI 36.19 kg/m    Constitutional: well developed, well nourished Head: normocephalic, atraumatic Eyes: sclera anicteric, no redness Neck: supple Lungs: normal respiratory effort Neurology: alert and oriented Skin: dry, no appreciable rashes Musculoskeletal: no appreciable defects Psychiatric: normal mood and affect Diabetic Foot Exam - Simple   No data filed      Current Medications Patient's Medications  New Prescriptions   GLUCAGON (GVOKE HYPOPEN 1-PACK) 1 MG/0.2ML SOAJ    Inject 1 mg into the skin as needed (low blood sugar with imaopired consciousness).  Previous Medications   ALLOPURINOL (ZYLOPRIM) 100 MG TABLET    TAKE 1 TABLET BY MOUTH TWICE A DAY   COENZYME Q10 (COQ-10 PO)    Take 1 tablet by mouth 2 (two) times  daily.   CONTINUOUS BLOOD GLUC SENSOR (DEXCOM G7 SENSOR) MISC    1 Device by Does not apply route as directed. Change sensor every 10 days   CYCLOBENZAPRINE (FLEXERIL) 5 MG TABLET    TAKE 1 TABLET BY MOUTH THREE TIMES A DAY AS NEEDED FOR MUSCLE SPASMS   DAPAGLIFLOZIN PROPANEDIOL (FARXIGA) 10 MG TABS TABLET    Take 1 tablet (10 mg total) by mouth daily.   DICLOFENAC SODIUM (VOLTAREN) 1 % GEL    Apply 2 g topically 4 (four) times daily.   FERROUS SULFATE 324 (65 FE) MG TBEC    Take 1 tablet (325 mg total) by mouth daily.   GABAPENTIN (NEURONTIN) 100 MG CAPSULE    Take 2 capsules (200 mg total) by mouth at bedtime.   GLIPIZIDE (GLUCOTROL XL) 10 MG 24 HR TABLET    TAKE 1 TABLET BY MOUTH TWICE A DAY  HUMULIN N 100 UNIT/ML INJECTION    Inject up to 30 units in morning and 40 units at bedtime   HYDROCHLOROTHIAZIDE (HYDRODIURIL) 25 MG TABLET    TAKE 1 TABLET BY MOUTH EVERY DAY   INDOMETHACIN (INDOCIN) 25 MG CAPSULE    TAKE 1 CAPSULE BY MOUTH TWICE A DAY AS NEEDED   INSULIN LISPRO (HUMALOG KWIKPEN) 100 UNIT/ML KWIKPEN    INJECT 10 UNITS INTO THE SKIN 3 (THREE) TIMES DAILY.   LISINOPRIL (ZESTRIL) 5 MG TABLET    Take 1 tablet (5 mg total) by mouth daily.   LOVASTATIN (MEVACOR) 40 MG TABLET    Take 1 tablet (40 mg total) by mouth at bedtime.   MELOXICAM (MOBIC) 15 MG TABLET    TAKE 1 TABLET BY MOUTH EVERY DAY AS NEEDED FOR PAIN   METFORMIN (GLUCOPHAGE) 1000 MG TABLET    TAKE 1 TABLET BY MOUTH TWICE A DAY WITH FOOD   OVER THE COUNTER MEDICATION    Take 1 tablet by mouth 2 (two) times daily. Beet Root supplement   PANTOPRAZOLE (PROTONIX) 40 MG TABLET    Take 1 tablet (40 mg total) by mouth daily.   SOLIFENACIN (VESICARE) 5 MG TABLET    Take 1 tablet (5 mg total) by mouth daily.   TAMSULOSIN (FLOMAX) 0.4 MG CAPS CAPSULE    Take 1 capsule (0.4 mg total) by mouth daily.   TRAMADOL (ULTRAM) 50 MG TABLET    Take 1 tablet (50 mg total) by mouth every 6 (six) hours as needed.  Modified Medications   Modified  Medication Previous Medication   INSULIN PEN NEEDLE (B-D ULTRAFINE III SHORT PEN) 31G X 8 MM MISC Insulin Pen Needle (B-D ULTRAFINE III SHORT PEN) 31G X 8 MM MISC      USE AS DIRECTED TWICE A DAY    USE AS DIRECTED TWICE A DAY  Discontinued Medications   FARXIGA 5 MG TABS TABLET    TAKE 1 TABLET (5 MG TOTAL) BY MOUTH DAILY.    Allergies No Known Allergies  Past Medical History Past Medical History:  Diagnosis Date   Chicken pox    Diabetes mellitus without complication (HCC)    Essential hypertension    GERD (gastroesophageal reflux disease)    Gouty arthritis    Hypertension    Iron deficiency anemia    Vitamin D deficiency     Past Surgical History Past Surgical History:  Procedure Laterality Date   BIOPSY  12/05/2021   Procedure: BIOPSY;  Surgeon: Sherrilyn Rist, MD;  Location: [facility];  Service: Gastroenterology;;   ESOPHAGOGASTRODUODENOSCOPY N/A 12/05/2021   Procedure: ESOPHAGOGASTRODUODENOSCOPY (EGD);  Surgeon: Sherrilyn Rist, MD;  Location: [facility];  Service: Gastroenterology;  Laterality: N/A;   HOT HEMOSTASIS N/A 12/05/2021   Procedure: HOT HEMOSTASIS (ARGON PLASMA COAGULATION/BICAP);  Surgeon: Sherrilyn Rist, MD;  Location: [facility];  Service: Gastroenterology;  Laterality: N/A;   INGUINAL HERNIA REPAIR Right 12/19/2017   Procedure: HERNIA REPAIR INGUINAL RIGHT;  Surgeon: Abigail Miyamoto, MD;  Location: [facility];  Service: General;  Laterality: Right;   INSERTION OF MESH Right 12/19/2017   Procedure: INSERTION OF MESH;  Surgeon: Abigail Miyamoto, MD;  Location: [facility];  Service: General;  Laterality: Right;   MASS EXCISION Right 12/19/2017   Procedure: EXCISION OF RIGHT SCROTAL MASS;  Surgeon: Abigail Miyamoto, MD;  Location: [facility];  Service: General;  Laterality: Right;   TONSILLECTOMY      Family History family history includes Diabetes in  his mother; Heart disease in his mother; Hypertension in his father and mother; Stroke in his brother  and father.  Social History Social History   Socioeconomic History   Marital status: Divorced    Spouse name: Not on file   Number of children: 1   Years of education: 12   Highest education level: Not on file  Occupational History   Occupation: retired  Tobacco Use   Smoking status: Former    Packs/day: 2.00    Years: 25.00    Additional pack years: 0.00    Total pack years: 50.00    Types: Cigarettes   Smokeless tobacco: Former    Types: Associate Professor Use: Never used  Substance and Sexual Activity   Alcohol use: Not Currently   Drug use: No   Sexual activity: Yes    Birth control/protection: Condom  Other Topics Concern   Not on file  Social History Narrative   Fun: Just working   Social Determinants of Corporate investment banker Strain: Not on file  Food Insecurity: Not on file  Transportation Needs: Not on file  Physical Activity: Not on file  Stress: Not on file  Social Connections: Not on file  Intimate Partner Violence: Not on file    Lab Results  Component Value Date   HGBA1C 8.5 (H) 09/19/2022   HGBA1C 8.1 (H) 06/26/2022   HGBA1C 8.0 (H) 03/24/2022   Lab Results  Component Value Date   CHOL 137 09/19/2022   Lab Results  Component Value Date   HDL 36.20 (L) 09/19/2022   Lab Results  Component Value Date   LDLCALC 64 09/19/2022   Lab Results  Component Value Date   TRIG 185.0 (H) 09/19/2022   Lab Results  Component Value Date   CHOLHDL 4 09/19/2022   Lab Results  Component Value Date   CREATININE 1.39 09/19/2022   Lab Results  Component Value Date   GFR 53.69 (L) 09/19/2022   Lab Results  Component Value Date   MICROALBUR <0.7 09/19/2022      Component Value Date/Time   NA 135 09/19/2022 0907   K 4.3 09/19/2022 0907   CL 102 09/19/2022 0907   CO2 25 09/19/2022 0907   GLUCOSE 141 (H) 09/19/2022 0907   BUN 20 09/19/2022 0907   CREATININE 1.39 09/19/2022 0907   CALCIUM 9.4 09/19/2022 0907   PROT 6.6  09/19/2022 0907   ALBUMIN 4.2 09/19/2022 0907   AST 15 09/19/2022 0907   ALT 26 09/19/2022 0907   ALKPHOS 61 09/19/2022 0907   BILITOT 0.3 09/19/2022 0907   GFRNONAA 56 (L) 04/25/2022 1904   GFRAA >60 01/30/2018 2037      Latest Ref Rng & Units 09/19/2022    9:07 AM 08/28/2022    9:53 AM 06/26/2022    9:08 AM  BMP  Glucose 70 - 99 mg/dL 161  096  045   BUN 6 - 23 mg/dL 20  25  20    Creatinine 0.40 - 1.50 mg/dL 4.09  8.11  9.14   Sodium 135 - 145 mEq/L 135  130  133   Potassium 3.5 - 5.1 mEq/L 4.3  3.9  4.2   Chloride 96 - 112 mEq/L 102  101  99   CO2 19 - 32 mEq/L 25  24  25    Calcium 8.4 - 10.5 mg/dL 9.4  9.6  9.3        Component Value Date/Time   WBC 7.9  09/19/2022 0907   RBC 5.10 09/19/2022 0907   HGB 13.1 09/19/2022 0907   HCT 40.7 09/19/2022 0907   PLT 316.0 09/19/2022 0907   MCV 79.8 09/19/2022 0907   MCH 25.0 (L) 04/25/2022 1904   MCHC 32.1 09/19/2022 0907   RDW 18.0 (H) 09/19/2022 0907   LYMPHSABS 2.8 09/19/2022 0907   MONOABS 0.6 09/19/2022 0907   EOSABS 0.2 09/19/2022 0907   BASOSABS 0.0 09/19/2022 0907     Parts of this note may have been dictated using voice recognition software. There may be variances in spelling and vocabulary which are unintentional. Not all errors are proofread. Please notify the Thereasa Parkin if any discrepancies are noted or if the meaning of any statement is not clear.

## 2022-12-04 NOTE — Telephone Encounter (Signed)
Patient called asking if Dr Katrinka Blazing would be able to refill the Gabapentin 100mg  (Take 2 by mouth at bedtime)?  Please advise.

## 2022-12-05 ENCOUNTER — Other Ambulatory Visit: Payer: Self-pay

## 2022-12-05 MED ORDER — GABAPENTIN 100 MG PO CAPS: 200.0000 mg | ORAL_CAPSULE | Freq: Every day | ORAL | 3 refills | Status: AC

## 2022-12-05 NOTE — Telephone Encounter (Signed)
Rx filled

## 2022-12-07 ENCOUNTER — Telehealth: Payer: Self-pay

## 2022-12-07 ENCOUNTER — Other Ambulatory Visit: Payer: Self-pay

## 2022-12-07 MED ORDER — INSULIN PEN NEEDLE 31G X 6 MM MISC: 3 refills | Status: DC

## 2022-12-07 NOTE — Telephone Encounter (Signed)
Patient called in requesting specific needles to use for his insulin. Needles were sent to [facility]

## 2022-12-14 ENCOUNTER — Other Ambulatory Visit: Payer: Self-pay

## 2022-12-14 DIAGNOSIS — Z794 Long term (current) use of insulin: Secondary | ICD-10-CM

## 2022-12-14 MED ORDER — INSULIN PEN NEEDLE 31G X 6 MM MISC
3 refills | Status: DC
Start: 2022-12-14 — End: 2023-08-31

## 2022-12-19 ENCOUNTER — Other Ambulatory Visit: Payer: Self-pay | Admitting: Internal Medicine

## 2022-12-25 ENCOUNTER — Other Ambulatory Visit: Payer: Self-pay | Admitting: Internal Medicine

## 2022-12-25 ENCOUNTER — Other Ambulatory Visit: Payer: Self-pay | Admitting: Gastroenterology

## 2022-12-25 DIAGNOSIS — I10 Essential (primary) hypertension: Secondary | ICD-10-CM

## 2022-12-26 ENCOUNTER — Other Ambulatory Visit: Payer: Self-pay

## 2022-12-26 MED ORDER — PANTOPRAZOLE SODIUM 40 MG PO TBEC: 40.0000 mg | DELAYED_RELEASE_TABLET | Freq: Every day | ORAL | 0 refills | Status: DC

## 2022-12-28 ENCOUNTER — Other Ambulatory Visit: Payer: Self-pay

## 2022-12-28 MED ORDER — "RELION INSULIN SYRINGE 31G X 15/64"" 0.5 ML MISC": 3 refills | Status: DC

## 2022-12-28 MED ORDER — SKIN TAC ADHESIVE BARRIER WIPE MISC: 1 refills | Status: AC

## 2023-01-15 ENCOUNTER — Other Ambulatory Visit (HOSPITAL_COMMUNITY): Payer: Self-pay

## 2023-01-22 ENCOUNTER — Other Ambulatory Visit: Payer: Self-pay | Admitting: Internal Medicine

## 2023-01-22 DIAGNOSIS — E1165 Type 2 diabetes mellitus with hyperglycemia: Secondary | ICD-10-CM

## 2023-01-24 ENCOUNTER — Other Ambulatory Visit: Payer: Self-pay | Admitting: Internal Medicine

## 2023-01-24 DIAGNOSIS — E1165 Type 2 diabetes mellitus with hyperglycemia: Secondary | ICD-10-CM

## 2023-02-15 ENCOUNTER — Other Ambulatory Visit: Payer: Self-pay

## 2023-02-15 DIAGNOSIS — E1165 Type 2 diabetes mellitus with hyperglycemia: Secondary | ICD-10-CM

## 2023-02-15 MED ORDER — DAPAGLIFLOZIN PROPANEDIOL 10 MG PO TABS
10.0000 mg | ORAL_TABLET | Freq: Every day | ORAL | 3 refills | Status: DC
Start: 2023-02-15 — End: 2024-03-05

## 2023-02-16 IMAGING — CR DG KNEE COMPLETE 4+V*R*
4 series · 4 of 4 positions shown · non-contrast
Comparison: None.

CLINICAL DATA: Right knee pain.

EXAM:
RIGHT KNEE - COMPLETE 4+ VIEW

[x knee ap right (1 of 3)]
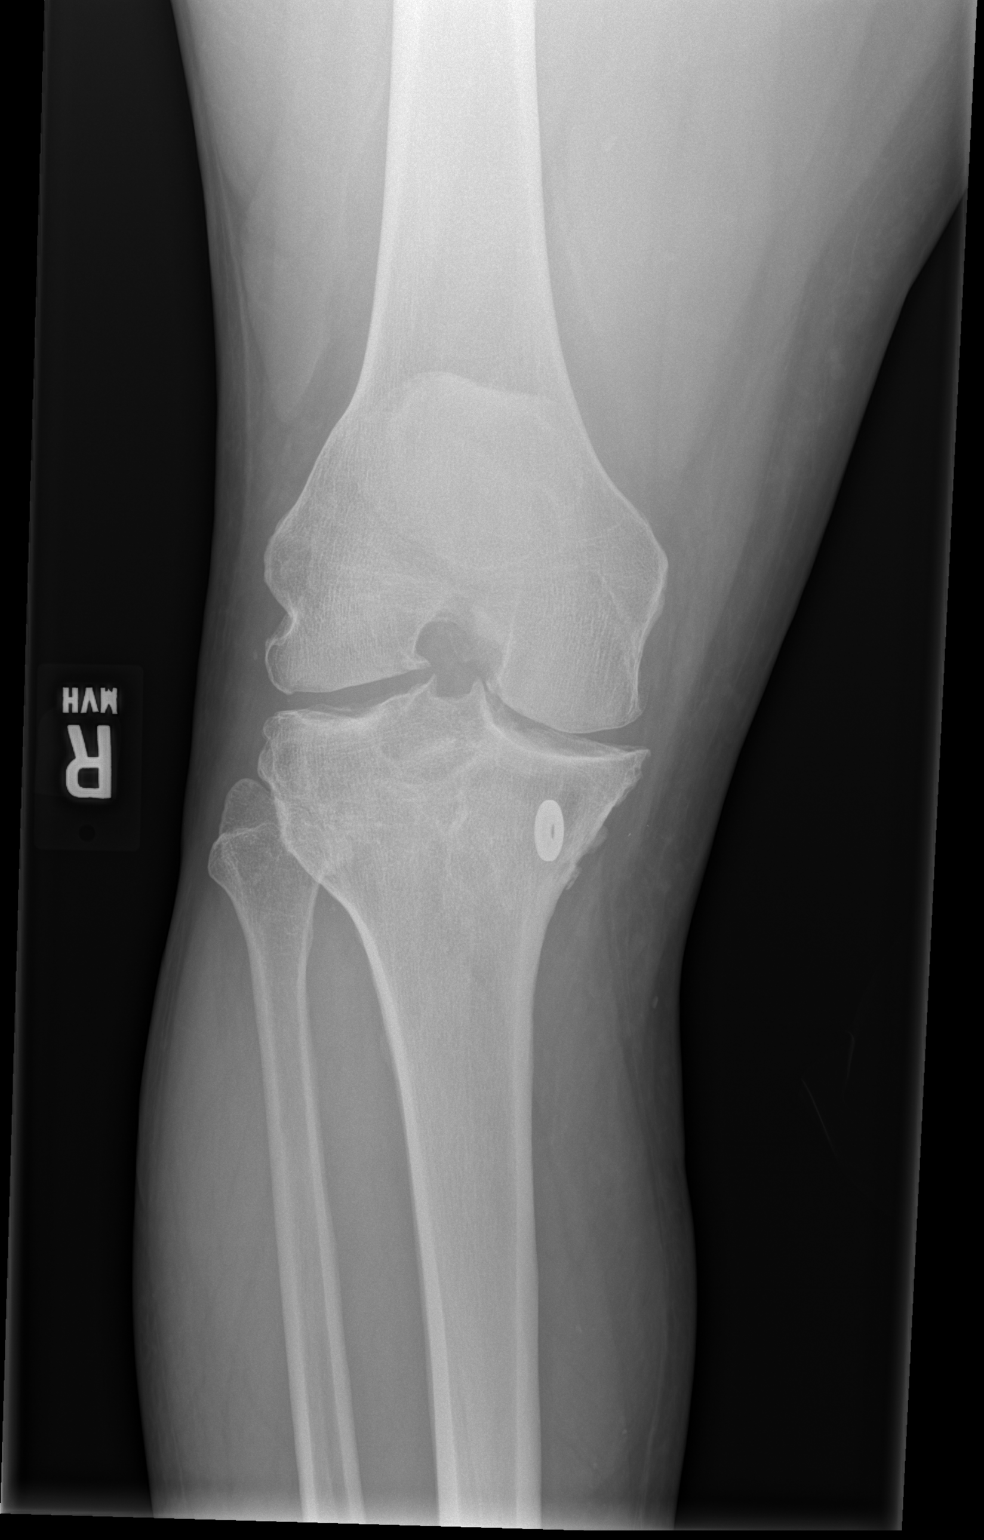

[x knee ap right (2 of 3)]
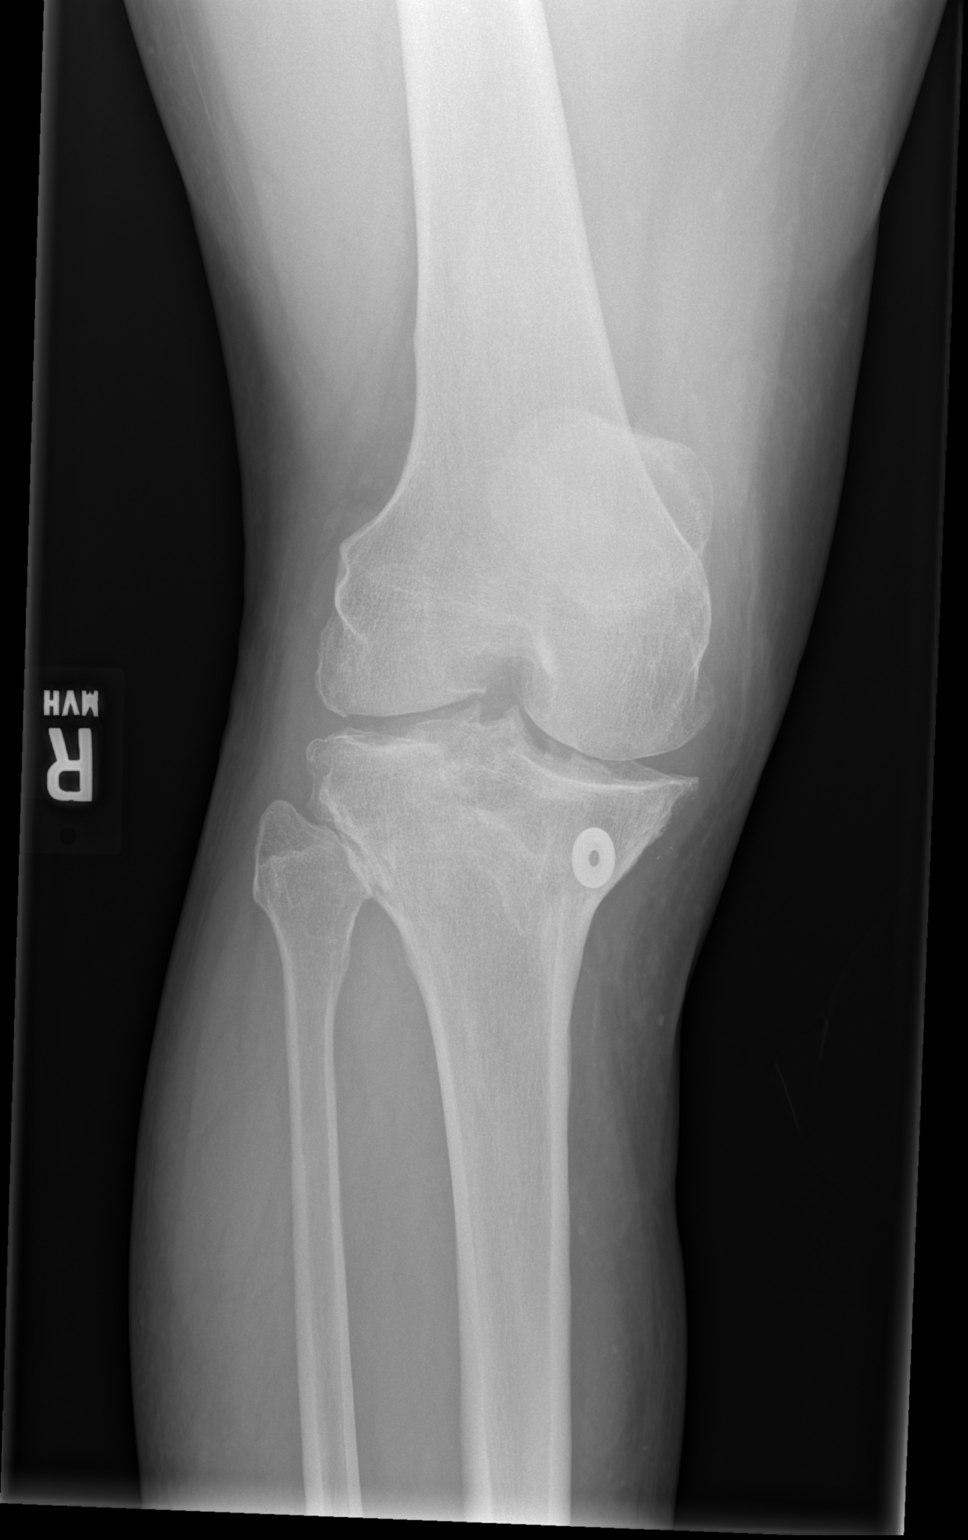

[x knee ap right (3 of 3)]
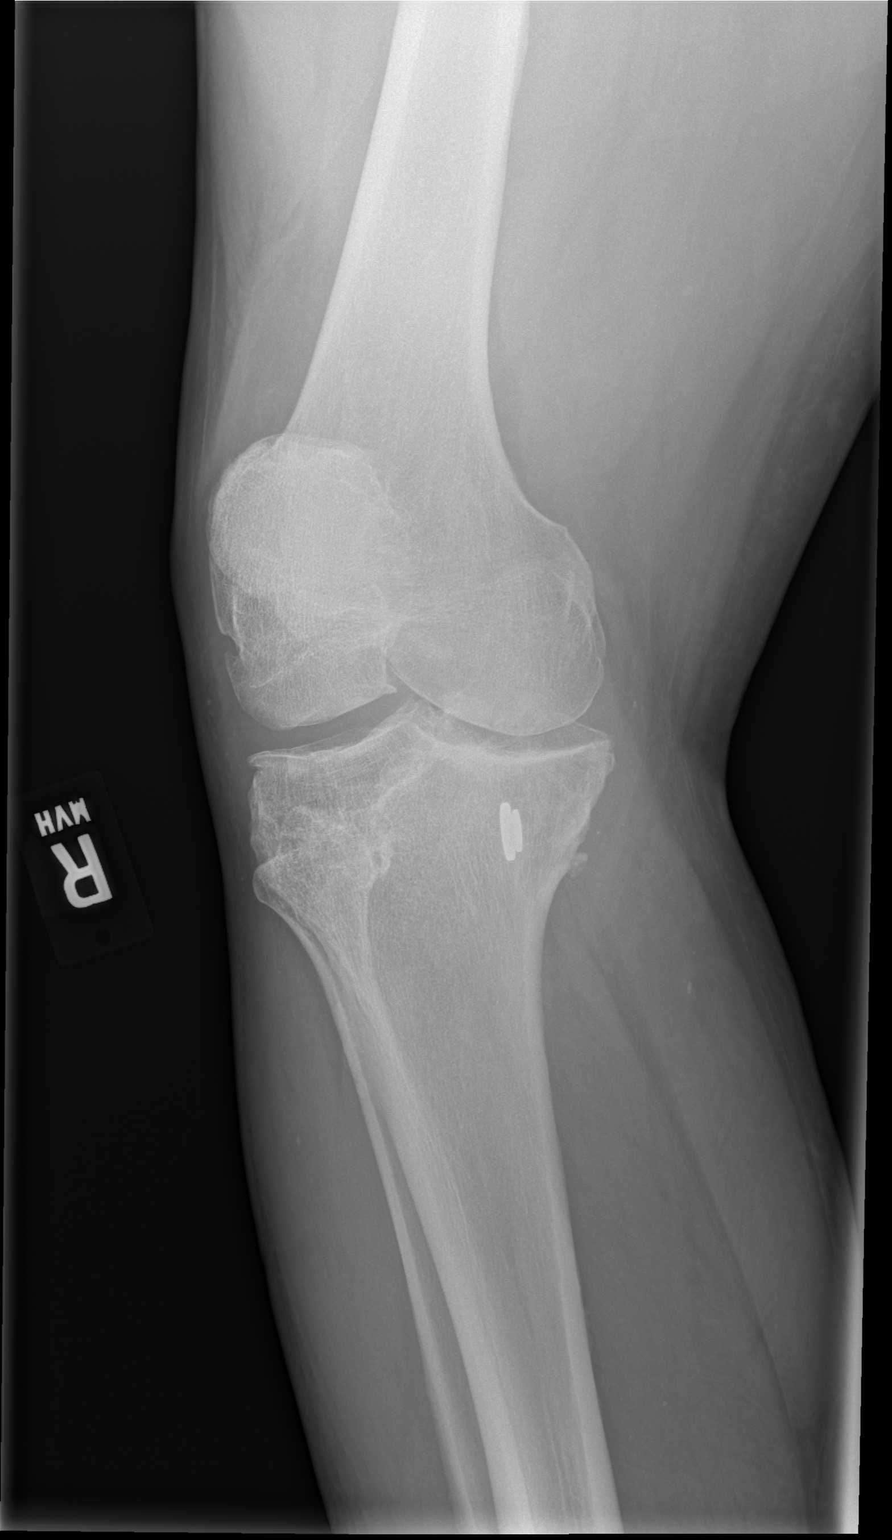

[x knee lat right]
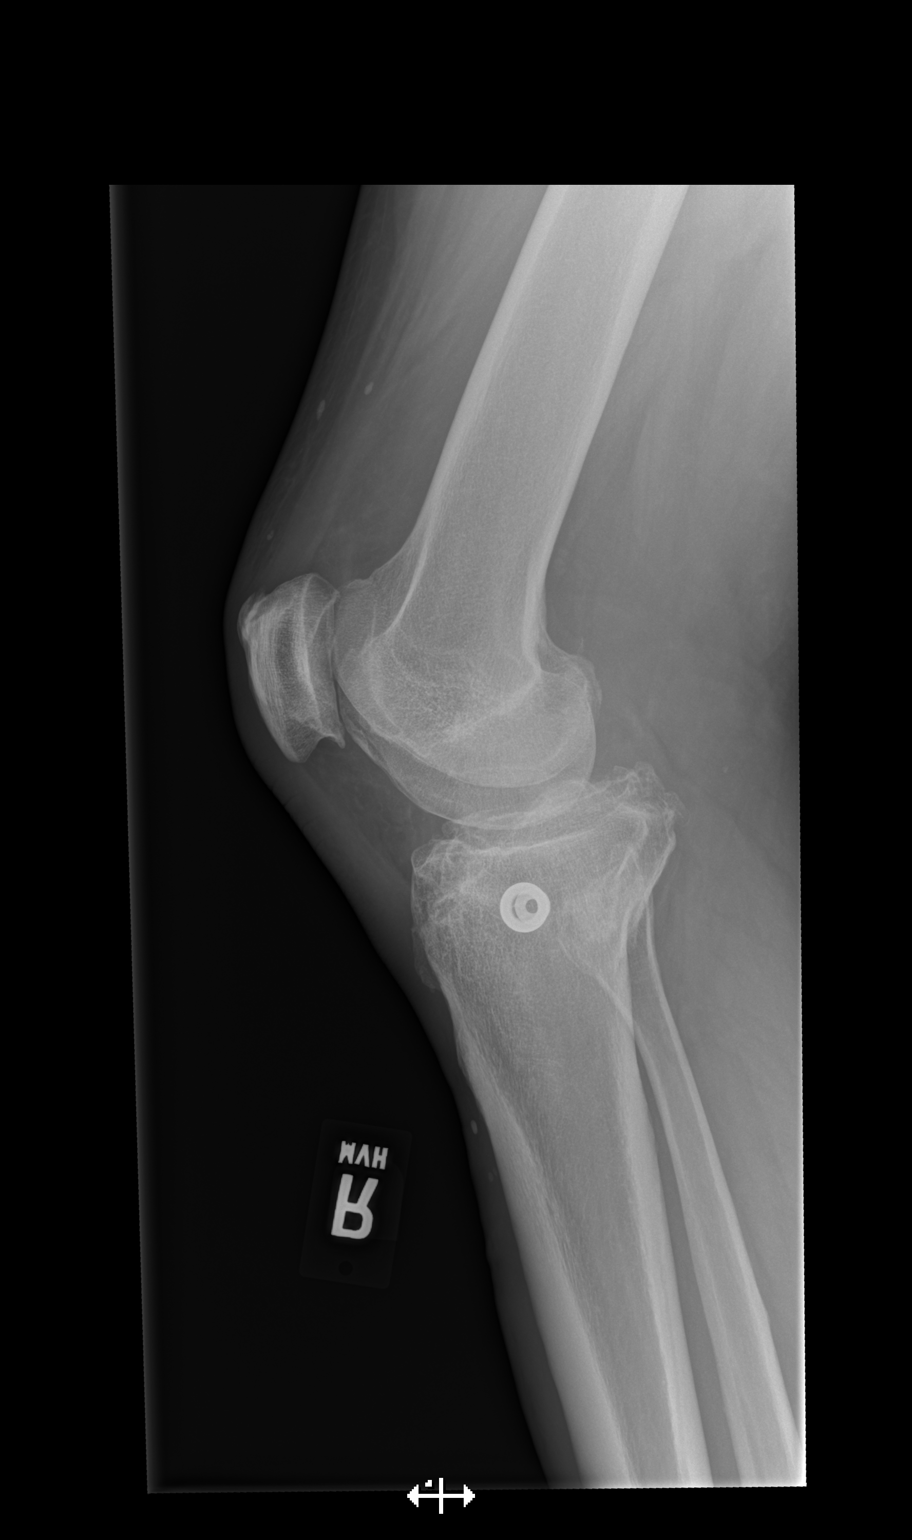

[4 of 4 positions shown; findings below may reference images not displayed]

FINDINGS: There are moderate tricompartmental degenerative changes of the
knee, greatest within the medial and patellofemoral compartments.
There is no large joint effusion. There is no acute displaced
fracture. There is a metallic foreign body projecting over the
medial tibial plateau, likely related to prior surgical
intervention.
IMPRESSION: Moderate tricompartmental degenerative changes of the knee. No acute
displaced fracture or large joint effusion.

## 2023-02-26 ENCOUNTER — Other Ambulatory Visit: Payer: Self-pay | Admitting: Internal Medicine

## 2023-02-26 ENCOUNTER — Other Ambulatory Visit: Payer: Self-pay

## 2023-03-05 ENCOUNTER — Other Ambulatory Visit: Payer: Self-pay

## 2023-03-05 DIAGNOSIS — E1165 Type 2 diabetes mellitus with hyperglycemia: Secondary | ICD-10-CM

## 2023-03-06 NOTE — Progress Notes (Signed)
[name] [address] [place] [address] Phone: [phone] Subjective:   [name], am serving as a scribe for Dr. Antoine Primas.  I'm seeing this patient by the request  of:  Corwin Levins, MD  CC: Right knee pain  [id number]  11/14/2022 Repeat injection given today and tolerated the procedure well, discussed ice nodule exercises.  Discussed which activities to do more things to avoid patient is still doing well with the custom brace overall when it comes to the stability just more pain and swelling intermittently.  Patient still wants to avoid surgical intervention as long as possible.  Feels like he is going to be able to.  Follow-up with me again in 3 months.    Update 03/13/2023 Steven [name] is a 62 y.o. male coming in with complaint of R knee pain. Patient states here for knee injections. Okay with doing gel. Patient is having difficulty with both knees.  Seems to be right greater than left at the moment.      Past Medical History:  Diagnosis Date   Chicken pox    Diabetes mellitus without complication (HCC)    Essential hypertension    GERD (gastroesophageal reflux disease)    Gouty arthritis    Hypertension    Iron deficiency anemia    Vitamin D deficiency    Past Surgical History:  Procedure Laterality Date   BIOPSY  12/05/2021   Procedure: BIOPSY;  Surgeon: Sherrilyn Rist, MD;  Location: [facility];  Service: Gastroenterology;;   ESOPHAGOGASTRODUODENOSCOPY N/A 12/05/2021   Procedure: ESOPHAGOGASTRODUODENOSCOPY (EGD);  Surgeon: Sherrilyn Rist, MD;  Location: [facility];  Service: Gastroenterology;  Laterality: N/A;   HOT HEMOSTASIS N/A 12/05/2021   Procedure: HOT HEMOSTASIS (ARGON PLASMA COAGULATION/BICAP);  Surgeon: Sherrilyn Rist, MD;  Location: [facility];  Service: Gastroenterology;  Laterality: N/A;   INGUINAL HERNIA REPAIR Right 12/19/2017   Procedure: HERNIA REPAIR INGUINAL RIGHT;  Surgeon:  Abigail Miyamoto, MD;  Location: [facility];  Service: General;  Laterality: Right;   INSERTION OF MESH Right 12/19/2017   Procedure: INSERTION OF MESH;  Surgeon: Abigail Miyamoto, MD;  Location: [facility];  Service: General;  Laterality: Right;   MASS EXCISION Right 12/19/2017   Procedure: EXCISION OF RIGHT SCROTAL MASS;  Surgeon: Abigail Miyamoto, MD;  Location: [facility];  Service: General;  Laterality: Right;   TONSILLECTOMY     Social History   Socioeconomic History   Marital status: Divorced    Spouse name: Not on file   Number of children: 1   Years of education: 12   Highest education level: Not on file  Occupational History   Occupation: retired  Tobacco Use   Smoking status: Former    Current packs/day: 2.00    Average packs/day: 2.0 packs/day for 25.0 years (50.0 ttl pk-yrs)    Types: Cigarettes   Smokeless tobacco: Former    Types: Engineer, drilling   Vaping status: Never Used  Substance and Sexual Activity   Alcohol use: Not Currently   Drug use: No   Sexual activity: Yes    Birth control/protection: Condom  Other Topics Concern   Not on file  Social History Narrative   Fun: Just working   International aid/development worker of Corporate investment banker Strain: Not on file  Food Insecurity: Not on file  Transportation Needs: Not on file  Physical Activity: Not on file  Stress: Not on file  Social Connections:  Not on file   No Known Allergies Family History  Problem Relation Age of Onset   Heart disease Mother    Hypertension Mother    Diabetes Mother    Stroke Father    Hypertension Father    Stroke Brother     Current Outpatient Medications (Endocrine & Metabolic):    dapagliflozin propanediol (FARXIGA) 10 MG TABS tablet, Take 1 tablet (10 mg total) by mouth daily.   glipiZIDE (GLUCOTROL XL) 10 MG 24 hr tablet, TAKE 1 TABLET BY MOUTH TWICE A DAY   Glucagon (GVOKE HYPOPEN 1-PACK) 1 MG/0.2ML SOAJ, Inject 1 mg into the skin as needed (low blood sugar with imaopired  consciousness).   HUMULIN N 100 UNIT/ML injection, Inject up to 30 units in morning and 40 units at bedtime   insulin lispro (HUMALOG KWIKPEN) 100 UNIT/ML KwikPen, INJECT 10 UNITS INTO THE SKIN 3 (THREE) TIMES DAILY.   metFORMIN (GLUCOPHAGE) 1000 MG tablet, TAKE 1 TABLET BY MOUTH TWICE A DAY WITH FOOD  Current Outpatient Medications (Cardiovascular):    hydrochlorothiazide (HYDRODIURIL) 25 MG tablet, TAKE 1 TABLET BY MOUTH EVERY DAY   lisinopril (ZESTRIL) 5 MG tablet, Take 1 tablet (5 mg total) by mouth daily.   lovastatin (MEVACOR) 40 MG tablet, Take 1 tablet (40 mg total) by mouth at bedtime.   Current Outpatient Medications (Analgesics):    allopurinol (ZYLOPRIM) 100 MG tablet, TAKE 1 TABLET BY MOUTH TWICE A DAY   indomethacin (INDOCIN) 25 MG capsule, TAKE 1 CAPSULE BY MOUTH TWICE A DAY AS NEEDED   meloxicam (MOBIC) 15 MG tablet, TAKE 1 TABLET BY MOUTH EVERY DAY AS NEEDED FOR PAIN   traMADol (ULTRAM) 50 MG tablet, TAKE 1 TABLET BY MOUTH EVERY 6 HOURS AS NEEDED.  Current Outpatient Medications (Hematological):    ferrous sulfate 324 (65 Fe) MG TBEC, Take 1 tablet (325 mg total) by mouth daily.  Current Outpatient Medications (Other):    Coenzyme Q10 (COQ-10 PO), Take 1 tablet by mouth 2 (two) times daily.   Continuous Blood Gluc Sensor (DEXCOM G7 SENSOR) MISC, 1 Device by Does not apply route as directed. Change sensor every 10 days   cyclobenzaprine (FLEXERIL) 5 MG tablet, TAKE 1 TABLET BY MOUTH THREE TIMES A DAY AS NEEDED FOR MUSCLE SPASMS   diclofenac Sodium (VOLTAREN) 1 % GEL, Apply 2 g topically 4 (four) times daily.   gabapentin (NEURONTIN) 100 MG capsule, Take 2 capsules (200 mg total) by mouth at bedtime.   Insulin Pen Needle (B-D ULTRAFINE III SHORT PEN) 31G X 8 MM MISC, USE AS DIRECTED TWICE A DAY   Insulin Pen Needle 31G X 6 MM MISC, Use as directed twice daily   Insulin Syringe-Needle U-100 (RELION INSULIN SYRINGE) 31G X 15/64" 0.5 ML MISC, Use to inject insulin twice a  day.   Ostomy Supplies (SKIN TAC ADHESIVE BARRIER WIPE) MISC, Use to help apply Dexcom sensor every 10 days   OVER THE COUNTER MEDICATION, Take 1 tablet by mouth 2 (two) times daily. Beet Root supplement   pantoprazole (PROTONIX) 40 MG tablet, Take 1 tablet (40 mg total) by mouth daily.   solifenacin (VESICARE) 5 MG tablet, Take 1 tablet (5 mg total) by mouth daily.   tamsulosin (FLOMAX) 0.4 MG CAPS capsule, Take 1 capsule (0.4 mg total) by mouth daily.   Reviewed prior external information including notes and imaging from  primary care provider As well as notes that were available from care everywhere and other healthcare systems.  Past medical history, social,  surgical and family history all reviewed in electronic medical record.  No pertanent information unless stated regarding to the chief complaint.   Review of Systems:  No headache, visual changes, nausea, vomiting, diarrhea, constipation, dizziness, abdominal pain, skin rash, fevers, chills, night sweats, weight loss, swollen lymph nodes, body aches, joint swelling, chest pain, shortness of breath, mood changes. POSITIVE muscle aches  Objective  Blood pressure 110/80, pulse 79, height 5\' 8"  (1.727 m), weight 246 lb (111.6 kg), SpO2 96%.   General: No apparent distress alert and oriented x3 mood and affect normal, dressed appropriately.  HEENT: Pupils equal, extraocular movements intact  Respiratory: Patient's speak in full sentences and does not appear short of breath  Cardiovascular: No lower extremity edema, non tender, no erythema  Antalgic gait noted.  Does have tenderness to palpation in the instability with valgus and varus force noted.  After informed written and verbal consent, patient was seated on exam table. Right knee was prepped with alcohol swab and utilizing anterolateral approach, patient's right knee space was injected with 60 mg per 3 mL of narrowing (sodium hyaluronate) in a prefilled syringe was injected easily  into the knee through a 22-gauge needle..Patient tolerated the procedure well without immediate complications.  After informed written and verbal consent, patient was seated on exam table. Left knee was prepped with alcohol swab and utilizing anterolateral approach, patient's left knee space was injected with 60 mg per 3 mL of Durolane (sodium hyaluronate) in a prefilled syringe was injected easily into the knee through a 22-gauge needle..Patient tolerated the procedure well without immediate complications.    Impression and Recommendations:     The above documentation has been reviewed and is accurate and complete Judi Saa, DO

## 2023-03-07 ENCOUNTER — Ambulatory Visit (INDEPENDENT_AMBULATORY_CARE_PROVIDER_SITE_OTHER): Payer: Managed Care, Other (non HMO) | Admitting: "Endocrinology

## 2023-03-07 ENCOUNTER — Encounter: Payer: Self-pay | Admitting: "Endocrinology

## 2023-03-07 VITALS — BP 134/70 | HR 88 | Resp 20 | Ht 68.0 in | Wt 245.8 lb

## 2023-03-07 DIAGNOSIS — Z794 Long term (current) use of insulin: Secondary | ICD-10-CM

## 2023-03-07 DIAGNOSIS — E1165 Type 2 diabetes mellitus with hyperglycemia: Secondary | ICD-10-CM | POA: Diagnosis not present

## 2023-03-07 DIAGNOSIS — E782 Mixed hyperlipidemia: Secondary | ICD-10-CM | POA: Diagnosis not present

## 2023-03-07 DIAGNOSIS — Z7984 Long term (current) use of oral hypoglycemic drugs: Secondary | ICD-10-CM | POA: Diagnosis not present

## 2023-03-07 LAB — POCT GLYCOSYLATED HEMOGLOBIN (HGB A1C): Hemoglobin A1C: 7.9 % — AB (ref 4.0–5.6)

## 2023-03-07 NOTE — Patient Instructions (Signed)

## 2023-03-07 NOTE — Progress Notes (Signed)
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, neuropathy  Lab Results  Component Value Date   GFR 53.69 (L) 09/19/2022   Hba1c goal less than 7, current Hba1c is  Lab Results  Component Value Date   HGBA1C 7.9 (A) 03/07/2023   Currently on: Farxiga 10 mg daily, metformin 1 g twice daily, glipizide XL 10 mg bid  Insulin regimen: 25 NPH am and 35 hs (takes after meals, instructed to take 15 min before break fast and dinner) Humalog 25 units before break fast, forgets with lunch and dinner often  Check BG before meals Use skin tac to allow dexcom to stay put  No known contraindications to any of above medications Glucagon discussed and prescribed with refills on 12/04/22  -Last LD and Tg are as follows: Lab Results  Component Value Date   [facility] 62 09/19/2022    Lab Results  Component Value Date   TRIG 185.0 (H) 09/19/2022   -on lovastatin 40 mg QD -Follow low fat diet and exercise   -Blood pressure goal <140/90 - Microalbumin/creatinine goal < 30 -Last MA/Cr is as follows: Lab Results  Component Value Date   MICROALBUR <0.7 09/19/2022   -on ACE/ARB lisinopril 5 mg every day  -diet changes including salt restriction -limit eating outside -counseled BP targets per standards of diabetes care -uncontrolled blood pressure can lead to retinopathy, nephropathy and cardiovascular and atherosclerotic heart disease  Reviewed and counseled  on: -A1C target -Blood sugar targets -Complications of uncontrolled diabetes  -Checking blood sugar before meals and bedtime and bring log next visit -All medications with mechanism of action and side effects -Hypoglycemia management: rule of 15's, Glucagon Emergency Kit and medical alert ID -low-carb low-fat plate-method diet -At least 20 minutes of physical activity per day -Annual dilated retinal eye exam and foot exam -compliance and follow up needs -follow up as scheduled or earlier if problem gets worse  Call if blood sugar is less than 70 or consistently above 250    Take a 15 gm snack of carbohydrate at bedtime before you go to sleep if your blood sugar is less than 100.    If you are going to fast after midnight for a test or procedure, ask your physician for instructions on how to reduce/decrease your insulin dose.    Call if blood sugar is less than 70 or consistently above 250  -Treating a low sugar by rule of 15  (15 gms of sugar every 15 min until sugar is more than 70) If you feel your sugar is low, test your sugar to be sure If your sugar is low (less than 70), then take 15 grams of a fast acting Carbohydrate (3-4 glucose tablets or glucose gel or 4 ounces of juice or regular soda) Recheck your sugar 15 min after treating low to make sure it is more than 70 If sugar is still less than 70, treat again with 15 grams of carbohydrate  Don't drive the hour of hypoglycemia  If unconscious/unable to eat or drink by mouth, use glucagon injection or nasal spray baqsimi and call 911. Can repeat again in 15 min if still unconscious.  Return in about 6 weeks (around 04/18/2023).   I have reviewed current medications, nurse's notes, allergies, vital signs, past medical and surgical history, family medical history, and social history for this encounter. Counseled patient on symptoms, examination findings, lab findings, imaging results, treatment decisions and monitoring and  prognosis. The patient understood the recommendations and agrees with the treatment plan. All questions regarding treatment plan were fully answered.  Steven Orocovis, MD  03/07/23    History of Present Illness Steven [name] is a 62 y.o. year old male who presents for evaluation of Type II diabetes mellitus.  Steven [name] was first diagnosed in 2010.   Diabetes education +  Home diabetes regimen: Farxiga 10 mg daily, metformin 1 g twice daily, glipizide XL 10 mg bid  Insulin regimen: 25 NPH, am and 35 hs Humalog 25 units before break fast  Previous history:     Previously on metformin and started on insulin in 10/21 He has only been on NPH A1c range in the last few years: 8.1-12.1  COMPLICATIONS -  MI/Stroke -  retinopathy +  neuropathy +  nephropathy  BLOOD SUGAR DATA Doesn't check BG   Physical Exam  BP 134/70 (BP Location: Right Arm, Patient Position: Sitting, Cuff Size: Large)   Pulse 88   Resp 20   Ht 5\' 8"  (1.727 m)   Wt 245 lb 12.8 oz (111.5 kg)   SpO2 96%   BMI 37.37 kg/m    Constitutional: well developed, well nourished Head: normocephalic, atraumatic Eyes: sclera anicteric, no redness Neck: supple Lungs: normal respiratory effort Neurology: alert and oriented Skin: dry, no appreciable rashes Musculoskeletal: no appreciable defects Psychiatric: normal mood and affect Diabetic Foot Exam - Simple   No data filed      Current Medications Patient's Medications  New Prescriptions   No medications on file  Previous Medications   ALLOPURINOL (ZYLOPRIM) 100 MG TABLET    TAKE 1 TABLET BY MOUTH TWICE A DAY   COENZYME Q10 (COQ-10 PO)    Take 1 tablet by mouth 2 (two) times daily.   CONTINUOUS BLOOD GLUC SENSOR (DEXCOM G7 SENSOR) MISC    1 Device by Does not apply route as directed. Change sensor every 10 days   CYCLOBENZAPRINE (FLEXERIL) 5 MG TABLET    TAKE 1 TABLET BY MOUTH THREE TIMES A DAY AS NEEDED FOR MUSCLE SPASMS   DAPAGLIFLOZIN PROPANEDIOL  (FARXIGA) 10 MG TABS TABLET    Take 1 tablet (10 mg total) by mouth daily.   DICLOFENAC SODIUM (VOLTAREN) 1 % GEL    Apply 2 g topically 4 (four) times daily.   FERROUS SULFATE 324 (65 FE) MG TBEC    Take 1 tablet (325 mg total) by mouth daily.   GABAPENTIN (NEURONTIN) 100 MG CAPSULE    Take 2 capsules (200 mg total) by mouth at bedtime.   GLIPIZIDE (GLUCOTROL XL) 10 MG 24 HR TABLET    TAKE 1 TABLET BY MOUTH TWICE A DAY   GLUCAGON (GVOKE HYPOPEN 1-PACK) 1 MG/0.2ML SOAJ    Inject 1 mg into the skin as needed (low blood sugar with imaopired consciousness).   HUMULIN N 100 UNIT/ML INJECTION    Inject up to 30 units in morning and 40 units at bedtime   HYDROCHLOROTHIAZIDE (HYDRODIURIL) 25 MG TABLET  TAKE 1 TABLET BY MOUTH EVERY DAY   INDOMETHACIN (INDOCIN) 25 MG CAPSULE    TAKE 1 CAPSULE BY MOUTH TWICE A DAY AS NEEDED   INSULIN LISPRO (HUMALOG KWIKPEN) 100 UNIT/ML KWIKPEN    INJECT 10 UNITS INTO THE SKIN 3 (THREE) TIMES DAILY.   INSULIN PEN NEEDLE (B-D ULTRAFINE III SHORT PEN) 31G X 8 MM MISC    USE AS DIRECTED TWICE A DAY   INSULIN PEN NEEDLE 31G X 6 MM MISC    Use as directed twice daily   INSULIN SYRINGE-NEEDLE U-100 (RELION INSULIN SYRINGE) 31G X 15/64" 0.5 ML MISC    Use to inject insulin twice a day.   LISINOPRIL (ZESTRIL) 5 MG TABLET    Take 1 tablet (5 mg total) by mouth daily.   LOVASTATIN (MEVACOR) 40 MG TABLET    Take 1 tablet (40 mg total) by mouth at bedtime.   MELOXICAM (MOBIC) 15 MG TABLET    TAKE 1 TABLET BY MOUTH EVERY DAY AS NEEDED FOR PAIN   METFORMIN (GLUCOPHAGE) 1000 MG TABLET    TAKE 1 TABLET BY MOUTH TWICE A DAY WITH FOOD   OSTOMY SUPPLIES (SKIN TAC ADHESIVE BARRIER WIPE) MISC    Use to help apply Dexcom sensor every 10 days   OVER THE COUNTER MEDICATION    Take 1 tablet by mouth 2 (two) times daily. Beet Root supplement   PANTOPRAZOLE (PROTONIX) 40 MG TABLET    Take 1 tablet (40 mg total) by mouth daily.   SOLIFENACIN (VESICARE) 5 MG TABLET    Take 1 tablet (5 mg total) by  mouth daily.   TAMSULOSIN (FLOMAX) 0.4 MG CAPS CAPSULE    Take 1 capsule (0.4 mg total) by mouth daily.   TRAMADOL (ULTRAM) 50 MG TABLET    TAKE 1 TABLET BY MOUTH EVERY 6 HOURS AS NEEDED.  Modified Medications   No medications on file  Discontinued Medications   No medications on file    Allergies No Known Allergies  Past Medical History Past Medical History:  Diagnosis Date   Chicken pox    Diabetes mellitus without complication (HCC)    Essential hypertension    GERD (gastroesophageal reflux disease)    Gouty arthritis    Hypertension    Iron deficiency anemia    Vitamin D deficiency     Past Surgical History Past Surgical History:  Procedure Laterality Date   BIOPSY  12/05/2021   Procedure: BIOPSY;  Surgeon: Sherrilyn Rist, MD;  Location: [facility];  Service: Gastroenterology;;   ESOPHAGOGASTRODUODENOSCOPY N/A 12/05/2021   Procedure: ESOPHAGOGASTRODUODENOSCOPY (EGD);  Surgeon: Sherrilyn Rist, MD;  Location: [facility];  Service: Gastroenterology;  Laterality: N/A;   HOT HEMOSTASIS N/A 12/05/2021   Procedure: HOT HEMOSTASIS (ARGON PLASMA COAGULATION/BICAP);  Surgeon: Sherrilyn Rist, MD;  Location: [facility];  Service: Gastroenterology;  Laterality: N/A;   INGUINAL HERNIA REPAIR Right 12/19/2017   Procedure: HERNIA REPAIR INGUINAL RIGHT;  Surgeon: Abigail Miyamoto, MD;  Location: [facility];  Service: General;  Laterality: Right;   INSERTION OF MESH Right 12/19/2017   Procedure: INSERTION OF MESH;  Surgeon: Abigail Miyamoto, MD;  Location: [facility];  Service: General;  Laterality: Right;   MASS EXCISION Right 12/19/2017   Procedure: EXCISION OF RIGHT SCROTAL MASS;  Surgeon: Abigail Miyamoto, MD;  Location: [facility];  Service: General;  Laterality: Right;   TONSILLECTOMY      Family History family history includes Diabetes in his mother; Heart disease in his mother;  Hypertension in his father and mother; Stroke in his brother and father.  Social History Social  History   Socioeconomic History   Marital status: Divorced    Spouse name: Not on file   Number of children: 1   Years of education: 12   Highest education level: Not on file  Occupational History   Occupation: retired  Tobacco Use   Smoking status: Former    Current packs/day: 2.00    Average packs/day: 2.0 packs/day for 25.0 years (50.0 ttl pk-yrs)    Types: Cigarettes   Smokeless tobacco: Former    Types: Engineer, drilling   Vaping status: Never Used  Substance and Sexual Activity   Alcohol use: Not Currently   Drug use: No   Sexual activity: Yes    Birth control/protection: Condom  Other Topics Concern   Not on file  Social History Narrative   Fun: Just working   Social Determinants of Corporate investment banker Strain: Not on file  Food Insecurity: Not on file  Transportation Needs: Not on file  Physical Activity: Not on file  Stress: Not on file  Social Connections: Not on file  Intimate Partner Violence: Not on file    Lab Results  Component Value Date   HGBA1C 7.9 (A) 03/07/2023   HGBA1C 8.5 (H) 09/19/2022   HGBA1C 8.1 (H) 06/26/2022   Lab Results  Component Value Date   CHOL 137 09/19/2022   Lab Results  Component Value Date   HDL 36.20 (L) 09/19/2022   Lab Results  Component Value Date   LDLCALC 64 09/19/2022   Lab Results  Component Value Date   TRIG 185.0 (H) 09/19/2022   Lab Results  Component Value Date   CHOLHDL 4 09/19/2022   Lab Results  Component Value Date   CREATININE 1.39 09/19/2022   Lab Results  Component Value Date   GFR 53.69 (L) 09/19/2022   Lab Results  Component Value Date   MICROALBUR <0.7 09/19/2022      Component Value Date/Time   NA 135 09/19/2022 0907   K 4.3 09/19/2022 0907   CL 102 09/19/2022 0907   CO2 25 09/19/2022 0907   GLUCOSE 141 (H) 09/19/2022 0907   BUN 20 09/19/2022 0907   CREATININE 1.39 09/19/2022 0907   CALCIUM 9.4 09/19/2022 0907   PROT 6.6 09/19/2022 0907   ALBUMIN 4.2 09/19/2022  0907   AST 15 09/19/2022 0907   ALT 26 09/19/2022 0907   ALKPHOS 61 09/19/2022 0907   BILITOT 0.3 09/19/2022 0907   GFRNONAA 56 (L) 04/25/2022 1904   GFRAA >60 01/30/2018 2037      Latest Ref Rng & Units 09/19/2022    9:07 AM 08/28/2022    9:53 AM 06/26/2022    9:08 AM  BMP  Glucose 70 - 99 mg/dL 161  096  045   BUN 6 - 23 mg/dL 20  25  20    Creatinine 0.40 - 1.50 mg/dL 4.09  8.11  9.14   Sodium 135 - 145 mEq/L 135  130  133   Potassium 3.5 - 5.1 mEq/L 4.3  3.9  4.2   Chloride 96 - 112 mEq/L 102  101  99   CO2 19 - 32 mEq/L 25  24  25    Calcium 8.4 - 10.5 mg/dL 9.4  9.6  9.3        Component Value Date/Time   WBC 7.9 09/19/2022 0907   RBC 5.10 09/19/2022 0907   HGB 13.1  09/19/2022 0907   HCT 40.7 09/19/2022 0907   PLT 316.0 09/19/2022 0907   MCV 79.8 09/19/2022 0907   MCH 25.0 (L) 04/25/2022 1904   MCHC 32.1 09/19/2022 0907   RDW 18.0 (H) 09/19/2022 0907   LYMPHSABS 2.8 09/19/2022 0907   MONOABS 0.6 09/19/2022 0907   EOSABS 0.2 09/19/2022 0907   BASOSABS 0.0 09/19/2022 0907     Parts of this note may have been dictated using voice recognition software. There may be variances in spelling and vocabulary which are unintentional. Not all errors are proofread. Please notify the [facility] if any discrepancies are noted or if the meaning of any statement is not clear.

## 2023-03-13 ENCOUNTER — Encounter: Payer: Self-pay | Admitting: Family Medicine

## 2023-03-13 ENCOUNTER — Ambulatory Visit: Payer: Managed Care, Other (non HMO) | Admitting: Family Medicine

## 2023-03-13 VITALS — BP 110/80 | HR 79 | Ht 68.0 in | Wt 246.0 lb

## 2023-03-13 DIAGNOSIS — M1711 Unilateral primary osteoarthritis, right knee: Secondary | ICD-10-CM | POA: Diagnosis not present

## 2023-03-13 DIAGNOSIS — M25561 Pain in right knee: Secondary | ICD-10-CM | POA: Diagnosis not present

## 2023-03-13 DIAGNOSIS — G8929 Other chronic pain: Secondary | ICD-10-CM | POA: Diagnosis not present

## 2023-03-13 DIAGNOSIS — M25562 Pain in left knee: Secondary | ICD-10-CM | POA: Diagnosis not present

## 2023-03-13 MED ORDER — SODIUM HYALURONATE 60 MG/3ML IX PRSY
120.0000 mg | PREFILLED_SYRINGE | Freq: Once | INTRA_ARTICULAR | Status: AC
Start: 2023-03-13 — End: 2023-03-13
  Administered 2023-03-13: 120 mg via INTRA_ARTICULAR

## 2023-03-13 NOTE — Patient Instructions (Signed)
Good to see you! Steven [name] rep will contact you about the brace See you again in 2 months

## 2023-03-13 NOTE — Assessment & Plan Note (Signed)
Chronic problem with worsening symptoms.  Discussed icing regimen and home exercises, patient is wearing brace but does seem to be wearing at a very quick pace.  Discussed with patient about which activities to do and which ones to avoid.  Increase activity slowly.  Follow-up again in 12 weeks.

## 2023-03-14 ENCOUNTER — Other Ambulatory Visit: Payer: Self-pay

## 2023-03-14 ENCOUNTER — Other Ambulatory Visit: Payer: Self-pay | Admitting: Internal Medicine

## 2023-03-14 DIAGNOSIS — E1165 Type 2 diabetes mellitus with hyperglycemia: Secondary | ICD-10-CM

## 2023-03-21 ENCOUNTER — Other Ambulatory Visit: Payer: Self-pay | Admitting: Gastroenterology

## 2023-03-21 ENCOUNTER — Other Ambulatory Visit: Payer: Self-pay | Admitting: Internal Medicine

## 2023-03-21 ENCOUNTER — Other Ambulatory Visit: Payer: Self-pay

## 2023-03-26 ENCOUNTER — Encounter: Payer: Self-pay | Admitting: Gastroenterology

## 2023-03-26 ENCOUNTER — Ambulatory Visit (INDEPENDENT_AMBULATORY_CARE_PROVIDER_SITE_OTHER): Payer: Managed Care, Other (non HMO) | Admitting: Gastroenterology

## 2023-03-26 VITALS — BP 110/60 | HR 82 | Ht 68.0 in | Wt 247.0 lb

## 2023-03-26 DIAGNOSIS — A048 Other specified bacterial intestinal infections: Secondary | ICD-10-CM | POA: Diagnosis not present

## 2023-03-26 DIAGNOSIS — D509 Iron deficiency anemia, unspecified: Secondary | ICD-10-CM | POA: Diagnosis not present

## 2023-03-26 DIAGNOSIS — K219 Gastro-esophageal reflux disease without esophagitis: Secondary | ICD-10-CM | POA: Diagnosis not present

## 2023-03-26 NOTE — Patient Instructions (Addendum)
Please go to the lab today on the basement level.   _______________________________________________________  If your blood pressure at your visit was 140/90 or greater, please contact your primary care physician to follow up on this.  _______________________________________________________  If you are age 64 or older, your body mass index should be between 23-30. Your Body mass index is 37.56 kg/m. If this is out of the aforementioned range listed, please consider follow up with your Primary Care Provider.  If you are age 70 or younger, your body mass index should be between 19-25. Your Body mass index is 37.56 kg/m. If this is out of the aformentioned range listed, please consider follow up with your Primary Care Provider.   ________________________________________________________  The [facility] providers would like to encourage you to use [facility] to communicate with providers for non-urgent requests or questions.  Due to long hold times on the telephone, sending your provider a message by [facility] may be a faster and more efficient way to get a response.  Please allow 48 business hours for a response.  Please remember that this is for non-urgent requests.  _______________________________________________________ It was a pleasure to see you today!  Thank you for trusting me with your gastrointestinal care!

## 2023-03-26 NOTE — Progress Notes (Deleted)
[facility] progress note:  History: Steven [name] 03/26/2023  Referring provider: Corwin Levins, MD  Reason for consult/chief complaint: No chief complaint on file.   Subjective  HPI: Partha is here for reflux and history of iron deficiency anemia.  Clinical details in May 2023 office note.  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 EGD July 2023 with friable mucosa the gastric antrum treated with APC.  Biopsies positive for H. pylori and treated with bismuth based quadruple therapy.  Urea breath test to confirm eradication was ordered but there are no results.  He continues to take pantoprazole once daily for control of reflux symptoms.  ***   ROS:  Review of Systems   Past Medical History: Past Medical History:  Diagnosis Date   Chicken pox    Diabetes mellitus without complication (HCC)    Essential hypertension    GERD (gastroesophageal reflux disease)    Gouty arthritis    Hypertension    Iron deficiency anemia    Vitamin D deficiency      Past Surgical History: Past Surgical History:  Procedure Laterality Date   BIOPSY  12/05/2021   Procedure: BIOPSY;  Surgeon: Sherrilyn Rist, MD;  Location: [facility];  Service: Gastroenterology;;   ESOPHAGOGASTRODUODENOSCOPY N/A 12/05/2021   Procedure: ESOPHAGOGASTRODUODENOSCOPY (EGD);  Surgeon: Sherrilyn Rist, MD;  Location: [facility];  Service: Gastroenterology;  Laterality: N/A;   HOT HEMOSTASIS N/A 12/05/2021   Procedure: HOT HEMOSTASIS (ARGON PLASMA COAGULATION/BICAP);  Surgeon: Sherrilyn Rist, MD;  Location: [facility];  Service: Gastroenterology;  Laterality: N/A;   INGUINAL HERNIA REPAIR Right 12/19/2017   Procedure: HERNIA REPAIR INGUINAL RIGHT;  Surgeon: Abigail Miyamoto, MD;  Location: [facility];  Service: General;  Laterality: Right;   INSERTION OF MESH Right 12/19/2017   Procedure: INSERTION OF MESH;  Surgeon: Abigail Miyamoto, MD;  Location: [facility];  Service: General;  Laterality: Right;   MASS EXCISION Right 12/19/2017   Procedure: EXCISION OF RIGHT SCROTAL MASS;  Surgeon: Abigail Miyamoto, MD;  Location: [facility];  Service: General;  Laterality: Right;   TONSILLECTOMY       Family History: Family History  Problem Relation Age of Onset   Heart disease Mother    Hypertension Mother    Diabetes Mother    Stroke Father    Hypertension Father    Stroke Brother     Social History: Social History   Socioeconomic History   Marital status: Divorced    Spouse name: Not on file   Number of children: 1   Years of education: 12   Highest education level: Not on file  Occupational History   Occupation: retired  Tobacco Use   Smoking status: Former    Current packs/day: 2.00    Average packs/day: 2.0 packs/day for 25.0 years (50.0 ttl pk-yrs)    Types: Cigarettes   Smokeless tobacco: Former    Types: Engineer, drilling   Vaping status: Never Used  Substance and Sexual Activity   Alcohol use: Not Currently   Drug use: No   Sexual activity: Yes    Birth control/protection: Condom  Other Topics Concern   Not on file  Social History Narrative   Fun: Just working   Social Determinants of Corporate investment banker Strain: Not on file  Food Insecurity: Not on file  Transportation Needs: Not on file  Physical Activity: Not on file  Stress:  Not on file  Social Connections: Not on file    Allergies: No Known Allergies  Outpatient Meds: Current Outpatient Medications  Medication Sig Dispense Refill   tamsulosin (FLOMAX) 0.4 MG CAPS capsule TAKE 1 CAPSULE BY MOUTH EVERY DAY 30 capsule 11   allopurinol (ZYLOPRIM) 100 MG tablet TAKE 1 TABLET BY MOUTH TWICE A DAY 180 tablet 3   Coenzyme Q10 (COQ-10 PO) Take 1 tablet by mouth 2 (two) times daily.     Continuous Blood Gluc Sensor (DEXCOM G7 SENSOR) MISC 1 Device by Does not apply route as directed. Change sensor every 10 days 3 each 3    cyclobenzaprine (FLEXERIL) 5 MG tablet TAKE 1 TABLET BY MOUTH THREE TIMES A DAY AS NEEDED FOR MUSCLE SPASMS 90 tablet 5   dapagliflozin propanediol (FARXIGA) 10 MG TABS tablet Take 1 tablet (10 mg total) by mouth daily. 90 tablet 3   diclofenac Sodium (VOLTAREN) 1 % GEL Apply 2 g topically 4 (four) times daily. 50 g 0   ferrous sulfate 324 (65 Fe) MG TBEC Take 1 tablet (325 mg total) by mouth daily. 30 tablet 0   gabapentin (NEURONTIN) 100 MG capsule Take 2 capsules (200 mg total) by mouth at bedtime. 180 capsule 3   glipiZIDE (GLUCOTROL XL) 10 MG 24 hr tablet TAKE 1 TABLET BY MOUTH TWICE A DAY 60 tablet 4   Glucagon (GVOKE HYPOPEN 1-PACK) 1 MG/0.2ML SOAJ Inject 1 mg into the skin as needed (low blood sugar with imaopired consciousness). 0.4 mL 2   HUMULIN N 100 UNIT/ML injection Inject up to 30 units in morning and 40 units at bedtime 70 mL 3   hydrochlorothiazide (HYDRODIURIL) 25 MG tablet TAKE 1 TABLET BY MOUTH EVERY DAY 90 tablet 3   indomethacin (INDOCIN) 25 MG capsule TAKE 1 CAPSULE BY MOUTH TWICE A DAY AS NEEDED 60 capsule 5   insulin lispro (HUMALOG KWIKPEN) 100 UNIT/ML KwikPen INJECT 10 UNITS INTO THE SKIN 3 (THREE) TIMES DAILY. 15 mL 1   Insulin Pen Needle (B-D ULTRAFINE III SHORT PEN) 31G X 8 MM MISC USE AS DIRECTED TWICE A DAY 100 each 2   Insulin Pen Needle 31G X 6 MM MISC Use as directed twice daily 100 each 3   Insulin Syringe-Needle U-100 (RELION INSULIN SYRINGE) 31G X 15/64" 0.5 ML MISC Use to inject insulin twice a day. 200 each 3   lisinopril (ZESTRIL) 5 MG tablet Take 1 tablet (5 mg total) by mouth daily. 90 tablet 3   lovastatin (MEVACOR) 40 MG tablet Take 1 tablet (40 mg total) by mouth at bedtime. 90 tablet 3   meloxicam (MOBIC) 15 MG tablet TAKE 1 TABLET BY MOUTH EVERY DAY AS NEEDED FOR PAIN 30 tablet 5   metFORMIN (GLUCOPHAGE) 1000 MG tablet TAKE 1 TABLET BY MOUTH TWICE A DAY WITH FOOD 60 tablet 11   Ostomy Supplies (SKIN TAC ADHESIVE BARRIER WIPE) MISC Use to help apply  Dexcom sensor every 10 days 100 each 1   OVER THE COUNTER MEDICATION Take 1 tablet by mouth 2 (two) times daily. Beet Root supplement     pantoprazole (PROTONIX) 40 MG tablet Take 1 tablet (40 mg total) by mouth daily. 90 tablet 0   solifenacin (VESICARE) 5 MG tablet TAKE 1 TABLET (5 MG TOTAL) BY MOUTH DAILY. 30 tablet 11   traMADol (ULTRAM) 50 MG tablet TAKE 1 TABLET BY MOUTH EVERY 6 HOURS AS NEEDED. 120 tablet 2   No current facility-administered medications for this visit.  ___________________________________________________________________ Objective   Exam:  There were no vitals taken for this visit. Wt Readings from Last 3 Encounters:  03/13/23 246 lb (111.6 kg)  03/07/23 245 lb 12.8 oz (111.5 kg)  12/04/22 238 lb (108 kg)    General: ***  Eyes: sclera anicteric, no redness ENT: oral mucosa moist without lesions, no cervical or supraclavicular lymphadenopathy CV: ***, no JVD, no peripheral edema Resp: clear to auscultation bilaterally, normal RR and effort noted GI: soft, *** tenderness, with active bowel sounds. No guarding or palpable organomegaly noted. Skin; warm and dry, no rash or jaundice noted Neuro: awake, alert and oriented x 3. Normal gross motor function and fluent speech  Labs:     Latest Ref Rng & Units 09/19/2022    9:07 AM 04/25/2022    7:04 PM 03/24/2022   10:14 AM  CBC  WBC 4.0 - 10.5 K/uL 7.9  6.5  7.3   Hemoglobin 13.0 - 17.0 g/dL 70.6  23.7  62.8   Hematocrit 39.0 - 52.0 % 40.7  40.1  41.7   Platelets 150.0 - 400.0 K/uL 316.0  284  350.0      Radiologic Studies:  ***  Assessment: No diagnosis found.  ***  Plan:  ***  Thank you for the courtesy of this consult.  Please call me with any questions or concerns.  Charlie Pitter III  CC: Referring provider noted above

## 2023-03-26 NOTE — Progress Notes (Signed)
[facility] progress note:  History: Steven [name] 03/26/2023  Referring provider: Corwin Levins, MD  Reason for consult/chief complaint: Gastroesophageal Reflux (Pt states he is doing well on Pantoprazole; pt has no significant gi complaints at this time)   Subjective  HPI: Steven [name] is here for reflux and history of iron deficiency anemia.  Clinical details in May 2023 office note.  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 EGD July 2023 with friable mucosa the gastric antrum treated with APC.  Biopsies positive for H. pylori and treated with bismuth based quadruple therapy.  Urea breath test to confirm eradication was ordered but there are no results.  He continues to take pantoprazole once daily for control of reflux symptoms. ____________________________  Today, he reports feeling well overall. His reflux and heartburn symptoms are currently well managed with pantoprazole 40 mg once daily. He does report experiencing dysphagia every now and then but not often. He states he's currently taking one pill of iron supplements per his PCP's recommendation as his previous labs showed adequate iron levels. He is following with his PCP in few days.  We also reviewed his previous diagnosis of H. Pylori infection but he states that he did not undergo the Urea breath test to confirm eradication of the infection, and did not recall that plan to testing. He denies any appetite or weight changes.   Patient denies diarrhea, constipation, nausea, blood in stool, black stool, vomiting, abdominal pain, bloating, unintentional weight loss, or reflux.   ROS:  Review of Systems  Constitutional:  Negative for appetite change and fever.  HENT:  Positive for trouble swallowing.   Respiratory:  Negative for cough and shortness of breath.   Cardiovascular:  Negative for chest pain.  Gastrointestinal:  Negative for abdominal  distention, abdominal pain, anal bleeding, blood in stool, constipation, diarrhea, nausea, rectal pain and vomiting.  Genitourinary:  Negative for dysuria.  Musculoskeletal:  Negative for back pain.  Skin:  Negative for rash.  Neurological:  Negative for weakness.  All other systems reviewed and are negative.    Past Medical History: Past Medical History:  Diagnosis Date   Chicken pox    Diabetes mellitus without complication (HCC)    Essential hypertension    GERD (gastroesophageal reflux disease)    Gouty arthritis    Hypertension    Iron deficiency anemia    Vitamin D deficiency      Past Surgical History: Past Surgical History:  Procedure Laterality Date   BIOPSY  12/05/2021   Procedure: BIOPSY;  Surgeon: Sherrilyn Rist, MD;  Location: [facility];  Service: Gastroenterology;;   ESOPHAGOGASTRODUODENOSCOPY N/A 12/05/2021   Procedure: ESOPHAGOGASTRODUODENOSCOPY (EGD);  Surgeon: Sherrilyn Rist, MD;  Location: [facility];  Service: Gastroenterology;  Laterality: N/A;   HOT HEMOSTASIS N/A 12/05/2021   Procedure: HOT HEMOSTASIS (ARGON PLASMA COAGULATION/BICAP);  Surgeon: Sherrilyn Rist, MD;  Location: [facility];  Service: Gastroenterology;  Laterality: N/A;   INGUINAL HERNIA REPAIR Right 12/19/2017   Procedure: HERNIA REPAIR INGUINAL RIGHT;  Surgeon: Abigail Miyamoto, MD;  Location: [facility];  Service: General;  Laterality: Right;   INSERTION OF MESH Right 12/19/2017   Procedure: INSERTION OF MESH;  Surgeon: Abigail Miyamoto, MD;  Location: [facility];  Service: General;  Laterality: Right;   MASS EXCISION Right 12/19/2017   Procedure: EXCISION OF RIGHT SCROTAL MASS;  Surgeon: Abigail Miyamoto, MD;  Location: [facility];  Service: General;  Laterality: Right;   TONSILLECTOMY       Family History: Family History  Problem Relation Age of Onset   Heart disease Mother    Hypertension Mother    Diabetes Mother    Stroke Father    Hypertension Father    Stroke Brother      Social History: Social History   Socioeconomic History   Marital status: Divorced    Spouse name: Not on file   Number of children: 1   Years of education: 12   Highest education level: Not on file  Occupational History   Occupation: retired  Tobacco Use   Smoking status: Former    Current packs/day: 2.00    Average packs/day: 2.0 packs/day for 25.0 years (50.0 ttl pk-yrs)    Types: Cigarettes   Smokeless tobacco: Former    Types: Engineer, drilling   Vaping status: Never Used  Substance and Sexual Activity   Alcohol use: Not Currently   Drug use: No   Sexual activity: Yes    Birth control/protection: Condom  Other Topics Concern   Not on file  Social History Narrative   Fun: Just working   International aid/development worker of [employer] investment [employer] Strain: Not on file  Food Insecurity: Not on file  Transportation Needs: Not on file  Physical Activity: Not on file  Stress: Not on file  Social Connections: Not on file    Allergies: No Known Allergies  Outpatient Meds: Current Outpatient Medications  Medication Sig Dispense Refill   allopurinol (ZYLOPRIM) 100 MG tablet TAKE 1 TABLET BY MOUTH TWICE A DAY 180 tablet 3   Continuous Blood Gluc Sensor (DEXCOM G7 SENSOR) MISC 1 Device by Does not apply route as directed. Change sensor every 10 days 3 each 3   cyclobenzaprine (FLEXERIL) 5 MG tablet TAKE 1 TABLET BY MOUTH THREE TIMES A DAY AS NEEDED FOR MUSCLE SPASMS 90 tablet 5   dapagliflozin propanediol (FARXIGA) 10 MG TABS tablet Take 1 tablet (10 mg total) by mouth daily. 90 tablet 3   diclofenac Sodium (VOLTAREN) 1 % GEL Apply 2 g topically 4 (four) times daily. 50 g 0   ferrous sulfate 324 (65 Fe) MG TBEC Take 1 tablet (325 mg total) by mouth daily. 30 tablet 0   gabapentin (NEURONTIN) 100 MG capsule Take 2 capsules (200 mg total) by mouth at bedtime. 180 capsule 3   glipiZIDE (GLUCOTROL XL) 10 MG 24 hr tablet TAKE 1 TABLET BY MOUTH TWICE A DAY 60 tablet 4   HUMULIN N  100 UNIT/ML injection Inject up to 30 units in morning and 40 units at bedtime 70 mL 3   hydrochlorothiazide (HYDRODIURIL) 25 MG tablet TAKE 1 TABLET BY MOUTH EVERY DAY 90 tablet 3   indomethacin (INDOCIN) 25 MG capsule TAKE 1 CAPSULE BY MOUTH TWICE A DAY AS NEEDED 60 capsule 5   insulin lispro (HUMALOG KWIKPEN) 100 UNIT/ML KwikPen INJECT 10 UNITS INTO THE SKIN 3 (THREE) TIMES DAILY. 15 mL 1   Insulin Pen Needle (B-D ULTRAFINE III SHORT PEN) 31G X 8 MM MISC USE AS DIRECTED TWICE A DAY 100 each 2   Insulin Pen Needle 31G X 6 MM MISC Use as directed twice daily 100 each 3   Insulin Syringe-Needle U-100 (RELION INSULIN SYRINGE) 31G X 15/64" 0.5 ML MISC Use to inject insulin twice a day. 200 each 3   lisinopril (ZESTRIL) 5 MG tablet Take 1 tablet (5 mg total) by mouth daily. 90 tablet 3  lovastatin (MEVACOR) 40 MG tablet Take 1 tablet (40 mg total) by mouth at bedtime. 90 tablet 3   meloxicam (MOBIC) 15 MG tablet TAKE 1 TABLET BY MOUTH EVERY DAY AS NEEDED FOR PAIN 30 tablet 5   metFORMIN (GLUCOPHAGE) 1000 MG tablet TAKE 1 TABLET BY MOUTH TWICE A DAY WITH FOOD 60 tablet 11   Ostomy Supplies (SKIN TAC ADHESIVE BARRIER WIPE) MISC Use to help apply Dexcom sensor every 10 days 100 each 1   OVER THE COUNTER MEDICATION Take 1 tablet by mouth 2 (two) times daily. Beet Root supplement     solifenacin (VESICARE) 5 MG tablet TAKE 1 TABLET (5 MG TOTAL) BY MOUTH DAILY. 30 tablet 11   tamsulosin (FLOMAX) 0.4 MG CAPS capsule TAKE 1 CAPSULE BY MOUTH EVERY DAY 30 capsule 11   traMADol (ULTRAM) 50 MG tablet TAKE 1 TABLET BY MOUTH EVERY 6 HOURS AS NEEDED. 120 tablet 2   pantoprazole (PROTONIX) 40 MG tablet TAKE 1 TABLET BY MOUTH EVERY DAY 30 tablet 6   No current facility-administered medications for this visit.      ___________________________________________________________________ Objective   Exam:  BP 110/60   Pulse 82   Ht 5\' 8"  (1.727 m)   Wt 247 lb (112 kg)   BMI 37.56 kg/m  Wt Readings from Last  3 Encounters:  03/26/23 247 lb (112 kg)  03/13/23 246 lb (111.6 kg)  03/07/23 245 lb 12.8 oz (111.5 kg)   General: well-appearing   Eyes: sclera anicteric, no redness ENT: oral mucosa moist without lesions, no cervical or supraclavicular lymphadenopathy CV: RRR, no JVD, no peripheral edema Resp: clear to auscultation bilaterally, normal RR and effort noted GI: soft, no tenderness, with active bowel sounds. No guarding or palpable organomegaly noted. Skin; warm and dry, no rash or jaundice noted Neuro: awake, alert and oriented x 3. Normal gross motor function and fluent speech Rectal exam: rectal swab done   Labs:     Latest Ref Rng & Units 09/19/2022    9:07 AM 04/25/2022    7:04 PM 03/24/2022   10:14 AM  CBC  WBC 4.0 - 10.5 K/uL 7.9  6.5  7.3   Hemoglobin 13.0 - 17.0 g/dL 86.5  78.4  69.6   Hematocrit 39.0 - 52.0 % 40.7  40.1  41.7   Platelets 150.0 - 400.0 K/uL 316.0  284  350.0      Radiologic Studies:   Assessment: H. pylori infection  Gastroesophageal reflux disease without esophagitis  Iron deficiency anemia, unspecified iron deficiency anemia type   His IDA can be checked up at his PCP visit this week and consider discontinuing iron if clinically appropriate. GERD symptoms under control.  Plan: -reduce pantoprazole 40 mg to once every other day  -diatherix for h pylori infection in office today  -will contact patient with results  Thank you for the courtesy of this consult.  Please call me with any questions or concerns.   - Amada Jupiter, MD    [facility]   Ladona Mow M Kadhim,acting as a scribe for Charlie Pitter III, MD.,have documented all relevant documentation on the behalf of Sherrilyn Rist, MD,as directed by  Sherrilyn Rist, MD while in the presence of Sherrilyn Rist, MD.   Marvis Repress III, MD, have reviewed all documentation for this visit. The documentation on 03/26/23 for the exam, diagnosis, procedures, and orders are all accurate and  complete.    CC: Referring provider noted above

## 2023-03-28 ENCOUNTER — Ambulatory Visit: Payer: Commercial Managed Care - HMO | Admitting: Internal Medicine

## 2023-03-29 ENCOUNTER — Other Ambulatory Visit: Payer: Self-pay | Admitting: Internal Medicine

## 2023-03-30 ENCOUNTER — Other Ambulatory Visit: Payer: Self-pay | Admitting: Internal Medicine

## 2023-03-30 ENCOUNTER — Other Ambulatory Visit: Payer: Self-pay

## 2023-03-30 ENCOUNTER — Telehealth: Payer: Self-pay | Admitting: Gastroenterology

## 2023-03-30 DIAGNOSIS — E1165 Type 2 diabetes mellitus with hyperglycemia: Secondary | ICD-10-CM

## 2023-03-30 MED ORDER — INSULIN LISPRO (1 UNIT DIAL) 100 UNIT/ML (KWIKPEN): 10.0000 [IU] | PEN_INJECTOR | Freq: Three times a day (TID) | SUBCUTANEOUS | 1 refills | Status: DC

## 2023-03-30 MED ORDER — HUMULIN N 100 UNIT/ML ~~LOC~~ SUSP: SUBCUTANEOUS | 3 refills | Status: DC

## 2023-03-30 NOTE — Telephone Encounter (Signed)
Please let this patient know that his test collected in the office earlier this week showed that the H. pylori stomach bacteria is gone with the treatment he previously received.  Ellwood Dense MD

## 2023-04-01 ENCOUNTER — Other Ambulatory Visit: Payer: Self-pay | Admitting: "Endocrinology

## 2023-04-01 DIAGNOSIS — E1165 Type 2 diabetes mellitus with hyperglycemia: Secondary | ICD-10-CM

## 2023-04-03 ENCOUNTER — Other Ambulatory Visit: Payer: Self-pay

## 2023-04-03 DIAGNOSIS — E1165 Type 2 diabetes mellitus with hyperglycemia: Secondary | ICD-10-CM

## 2023-04-03 MED ORDER — NOVOLOG FLEXPEN 100 UNIT/ML ~~LOC~~ SOPN: 25.0000 [IU] | PEN_INJECTOR | Freq: Every day | SUBCUTANEOUS | 0 refills | Status: AC

## 2023-04-09 ENCOUNTER — Telehealth: Payer: Self-pay | Admitting: Radiology

## 2023-04-09 ENCOUNTER — Encounter: Payer: Self-pay | Admitting: Internal Medicine

## 2023-04-09 ENCOUNTER — Ambulatory Visit (INDEPENDENT_AMBULATORY_CARE_PROVIDER_SITE_OTHER): Payer: Managed Care, Other (non HMO) | Admitting: Internal Medicine

## 2023-04-09 VITALS — BP 122/80 | HR 63 | Temp 97.9°F | Ht 68.0 in | Wt 243.0 lb

## 2023-04-09 DIAGNOSIS — L509 Urticaria, unspecified: Secondary | ICD-10-CM | POA: Diagnosis not present

## 2023-04-09 DIAGNOSIS — E538 Deficiency of other specified B group vitamins: Secondary | ICD-10-CM

## 2023-04-09 DIAGNOSIS — J309 Allergic rhinitis, unspecified: Secondary | ICD-10-CM | POA: Diagnosis not present

## 2023-04-09 DIAGNOSIS — E1142 Type 2 diabetes mellitus with diabetic polyneuropathy: Secondary | ICD-10-CM | POA: Diagnosis not present

## 2023-04-09 DIAGNOSIS — E559 Vitamin D deficiency, unspecified: Secondary | ICD-10-CM

## 2023-04-09 DIAGNOSIS — I10 Essential (primary) hypertension: Secondary | ICD-10-CM | POA: Diagnosis not present

## 2023-04-09 DIAGNOSIS — Z7984 Long term (current) use of oral hypoglycemic drugs: Secondary | ICD-10-CM

## 2023-04-09 LAB — BASIC METABOLIC PANEL
BUN: 19 mg/dL (ref 6–23)
CO2: 26 meq/L (ref 19–32)
Calcium: 9.6 mg/dL (ref 8.4–10.5)
Chloride: 98 meq/L (ref 96–112)
Creatinine, Ser: 1.44 mg/dL (ref 0.40–1.50)
GFR: 51.26 mL/min — ABNORMAL LOW (ref >60.00)
Glucose, Bld: 74 mg/dL (ref 70–99)
Potassium: 4.2 meq/L (ref 3.5–5.1)
Sodium: 135 meq/L (ref 135–145)

## 2023-04-09 LAB — HEPATIC FUNCTION PANEL
ALT: 20 U/L (ref 0–53)
AST: 17 U/L (ref 0–37)
Albumin: 4.3 g/dL (ref 3.5–5.2)
Alkaline Phosphatase: 62 U/L (ref 39–117)
Bilirubin, Direct: 0.1 mg/dL (ref 0.0–0.3)
Total Bilirubin: 0.4 mg/dL (ref 0.2–1.2)
Total Protein: 7.4 g/dL (ref 6.0–8.3)

## 2023-04-09 LAB — LIPID PANEL
Cholesterol: 140 mg/dL (ref 0–200)
HDL: 32 mg/dL — ABNORMAL LOW (ref >39.00)
LDL Cholesterol: 59 mg/dL (ref 0–99)
NonHDL: 107.79
Total CHOL/HDL Ratio: 4
Triglycerides: 246 mg/dL — ABNORMAL HIGH (ref 0.0–149.0)
VLDL: 49.2 mg/dL — ABNORMAL HIGH (ref 0.0–40.0)

## 2023-04-09 LAB — VITAMIN D 25 HYDROXY (VIT D DEFICIENCY, FRACTURES): VITD: >120 ng/mL

## 2023-04-09 LAB — HEMOGLOBIN A1C: Hgb A1c MFr Bld: 8.6 % — ABNORMAL HIGH (ref 4.6–6.5)

## 2023-04-09 MED ORDER — PREDNISONE 10 MG PO TABS: ORAL_TABLET | ORAL | 0 refills | Status: DC

## 2023-04-09 NOTE — Telephone Encounter (Signed)
Please to contact pt  Vit d too high but this is not harmful  Regardless, ok to cut back on Vit D3 supplement to mon - wed- Friday only

## 2023-04-09 NOTE — Patient Instructions (Addendum)
Ok to stop the lisinopril as this may be the cause for the hives  Please take all new medication as prescribed - the prednisone  Ok to start the OTC B12 1000 mcg per day  Please continue all other medications as before, and refills have been done if requested.  Please have the pharmacy call with any other refills you may need.  Please continue your efforts at being more active, low cholesterol diet, and weight control.  Please keep your appointments with your specialists as you may have planned  You will be contacted regarding the referral for: eye doctor  Please go to the LAB at the blood drawing area for the tests to be done  You will be contacted by phone if any changes need to be made immediately.  Otherwise, you will receive a letter about your results with an explanation, but please check with MyChart first.  Please make an Appointment to return in 6 months, or sooner if needed

## 2023-04-09 NOTE — Telephone Encounter (Signed)
Critical lab

## 2023-04-09 NOTE — Progress Notes (Unsigned)
Patient ID: Jaheed Adem, male   DOB: June 08, 1960, 62 y.o.   MRN: [id number]        Chief Complaint: follow up HTN, low b12 and DM, hives and allergic rhinitis, lwo vit d       HPI:  Yuji Gismondi is a 62 y.o. male here with c/o 2 wks onset hives like rash to torso and extremities that comes and goes.  No sob, wheezing, tongue or other swelling.  Pt denies chest pain, increased sob or doe, wheezing, orthopnea, PND, increased LE swelling, palpitations, dizziness or syncope.   Pt denies polydipsia, polyuria, or new focal neuro s/s.    Pt denies fever, wt loss, night sweats, loss of appetite, or other constitutional symptoms  Does have several wks ongoing nasal allergy symptoms with clearish congestion, itch and sneezing, without fever, pain, ST, cough, swelling or wheezing.        Wt Readings from Last 3 Encounters:  04/09/23 243 lb (110.2 kg)  03/26/23 247 lb (112 kg)  03/13/23 246 lb (111.6 kg)   BP Readings from Last 3 Encounters:  04/09/23 122/80  03/26/23 110/60  03/13/23 110/80         Past Medical History:  Diagnosis Date   Chicken pox    Diabetes mellitus without complication (HCC)    Essential hypertension    GERD (gastroesophageal reflux disease)    Gouty arthritis    Hypertension    Iron deficiency anemia    Vitamin D deficiency    Past Surgical History:  Procedure Laterality Date   BIOPSY  12/05/2021   Procedure: BIOPSY;  Surgeon: Sherrilyn Rist, MD;  Location: [facility];  Service: Gastroenterology;;   ESOPHAGOGASTRODUODENOSCOPY N/A 12/05/2021   Procedure: ESOPHAGOGASTRODUODENOSCOPY (EGD);  Surgeon: Sherrilyn Rist, MD;  Location: [facility];  Service: Gastroenterology;  Laterality: N/A;   HOT HEMOSTASIS N/A 12/05/2021   Procedure: HOT HEMOSTASIS (ARGON PLASMA COAGULATION/BICAP);  Surgeon: Sherrilyn Rist, MD;  Location: [facility];  Service: Gastroenterology;  Laterality: N/A;   INGUINAL HERNIA REPAIR Right 12/19/2017   Procedure: HERNIA REPAIR INGUINAL  RIGHT;  Surgeon: Abigail Miyamoto, MD;  Location: [facility];  Service: General;  Laterality: Right;   INSERTION OF MESH Right 12/19/2017   Procedure: INSERTION OF MESH;  Surgeon: Abigail Miyamoto, MD;  Location: [facility];  Service: General;  Laterality: Right;   MASS EXCISION Right 12/19/2017   Procedure: EXCISION OF RIGHT SCROTAL MASS;  Surgeon: Abigail Miyamoto, MD;  Location: [facility];  Service: General;  Laterality: Right;   TONSILLECTOMY      reports that he has quit smoking. His smoking use included cigarettes. He has a 50 pack-year smoking history. He has quit using smokeless tobacco.  His smokeless tobacco use included chew. He reports that he does not currently use alcohol. He reports that he does not use drugs. family history includes Diabetes in his mother; Heart disease in his mother; Hypertension in his father and mother; Stroke in his brother and father. No Known Allergies Current Outpatient Medications on File Prior to Visit  Medication Sig Dispense Refill   allopurinol (ZYLOPRIM) 100 MG tablet TAKE 1 TABLET BY MOUTH TWICE A DAY 180 tablet 3   Continuous Blood Gluc Sensor (DEXCOM G7 SENSOR) MISC 1 Device by Does not apply route as directed. Change sensor every 10 days 3 each 3   cyclobenzaprine (FLEXERIL) 5 MG tablet TAKE 1 TABLET BY MOUTH THREE TIMES A DAY AS NEEDED FOR MUSCLE SPASM 90 tablet 5  cyclobenzaprine (FLEXERIL) 5 MG tablet TAKE 1 TABLET BY MOUTH THREE TIMES A DAY AS NEEDED FOR MUSCLE SPASM 60 tablet 5   dapagliflozin propanediol (FARXIGA) 10 MG TABS tablet Take 1 tablet (10 mg total) by mouth daily. 90 tablet 3   diclofenac Sodium (VOLTAREN) 1 % GEL Apply 2 g topically 4 (four) times daily. 50 g 0   ferrous sulfate 324 (65 Fe) MG TBEC Take 1 tablet (325 mg total) by mouth daily. 30 tablet 0   gabapentin (NEURONTIN) 100 MG capsule Take 2 capsules (200 mg total) by mouth at bedtime. 180 capsule 3   glipiZIDE (GLUCOTROL XL) 10 MG 24 hr tablet TAKE 1 TABLET BY MOUTH TWICE A DAY  60 tablet 4   HUMULIN N 100 UNIT/ML injection Inject up to 30 units in morning and 40 units at bedtime 70 mL 3   hydrochlorothiazide (HYDRODIURIL) 25 MG tablet TAKE 1 TABLET BY MOUTH EVERY DAY 30 tablet 11   indomethacin (INDOCIN) 25 MG capsule TAKE 1 CAPSULE BY MOUTH TWICE A DAY AS NEEDED 60 capsule 5   insulin aspart (NOVOLOG FLEXPEN) 100 UNIT/ML FlexPen Inject 25 Units into the skin daily before breakfast. 45 mL 0   insulin lispro (HUMALOG KWIKPEN) 100 UNIT/ML KwikPen Inject 10 Units into the skin 3 (three) times daily. 15 mL 1   Insulin Pen Needle (B-D ULTRAFINE III SHORT PEN) 31G X 8 MM MISC USE AS DIRECTED TWICE A DAY 100 each 2   Insulin Pen Needle 31G X 6 MM MISC Use as directed twice daily 100 each 3   Insulin Syringe-Needle U-100 (RELION INSULIN SYRINGE) 31G X 15/64" 0.5 ML MISC Use to inject insulin twice a day. 200 each 3   lovastatin (MEVACOR) 40 MG tablet Take 1 tablet (40 mg total) by mouth at bedtime. 90 tablet 3   meloxicam (MOBIC) 15 MG tablet TAKE 1 TABLET BY MOUTH EVERY DAY AS NEEDED FOR PAIN 30 tablet 5   metFORMIN (GLUCOPHAGE) 1000 MG tablet TAKE 1 TABLET BY MOUTH TWICE A DAY WITH FOOD 60 tablet 11   Ostomy Supplies (SKIN TAC ADHESIVE BARRIER WIPE) MISC Use to help apply Dexcom sensor every 10 days 100 each 1   OVER THE COUNTER MEDICATION Take 1 tablet by mouth 2 (two) times daily. Beet Root supplement     pantoprazole (PROTONIX) 40 MG tablet TAKE 1 TABLET BY MOUTH EVERY DAY 30 tablet 6   solifenacin (VESICARE) 5 MG tablet TAKE 1 TABLET (5 MG TOTAL) BY MOUTH DAILY. 30 tablet 11   tamsulosin (FLOMAX) 0.4 MG CAPS capsule TAKE 1 CAPSULE BY MOUTH EVERY DAY 30 capsule 11   traMADol (ULTRAM) 50 MG tablet TAKE 1 TABLET BY MOUTH EVERY 6 HOURS AS NEEDED 60 tablet 0   No current facility-administered medications on file prior to visit.        ROS:  All others reviewed and negative.  Objective        PE:  BP 122/80 (BP Location: Left Arm, Patient Position: Sitting, Cuff Size:  Normal)   Pulse 63   Temp 97.9 F (36.6 C) (Oral)   Ht 5\' 8"  (1.727 m)   Wt 243 lb (110.2 kg)   SpO2 98%   BMI 36.95 kg/m                 Constitutional: Pt appears in NAD               HENT: Head: NCAT.  Right Ear: External ear normal.                 Left Ear: External ear normal.                Eyes: . Pupils are equal, round, and reactive to light. Conjunctivae and EOM are normal               Nose: without d/c or deformity               Neck: Neck supple. Gross normal ROM               Cardiovascular: Normal rate and regular rhythm.                 Pulmonary/Chest: Effort normal and breath sounds without rales or wheezing.                Abd:  Soft, NT, ND, + BS, no organomegaly               Neurological: Pt is alert. At baseline orientation, motor grossly intact               Skin: Skin is warm., LE edema - none, hive like rash wheel and flare to torso and extremities               Psychiatric: Pt behavior is normal without agitation   Micro: none  Cardiac tracings I have personally interpreted today:  none  Pertinent Radiological findings (summarize): none   Lab Results  Component Value Date   WBC 7.9 09/19/2022   HGB 13.1 09/19/2022   HCT 40.7 09/19/2022   PLT 316.0 09/19/2022   GLUCOSE 74 04/09/2023   CHOL 140 04/09/2023   TRIG 246.0 (H) 04/09/2023   HDL 32.00 (L) 04/09/2023   LDLDIRECT 78.0 03/24/2022   LDLCALC 59 04/09/2023   ALT 20 04/09/2023   AST 17 04/09/2023   NA 135 04/09/2023   K 4.2 04/09/2023   CL 98 04/09/2023   CREATININE 1.44 04/09/2023   BUN 19 04/09/2023   CO2 26 04/09/2023   TSH 2.05 09/19/2022   PSA 0.50 09/19/2022   HGBA1C 8.6 (H) 04/09/2023   MICROALBUR <0.7 09/19/2022   Assessment/Plan:  Fenton Mongelli is a 62 y.o. Black or African American [2] male with  has a past medical history of Chicken pox, Diabetes mellitus without complication (HCC), Essential hypertension, GERD (gastroesophageal reflux disease), Gouty  arthritis, Hypertension, Iron deficiency anemia, and Vitamin D deficiency.  Vitamin D deficiency Last vitamin D Lab Results  Component Value Date   VD25OH >120 04/09/2023   overcontrolled, cont oral replacement reduced mon - wed - fri   Essential hypertension BP Readings from Last 3 Encounters:  04/09/23 122/80  03/26/23 110/60  03/13/23 110/80   Stable, pt to continue medical treatment hct 25 every day but STOP ACEI   Diabetes (HCC) Lab Results  Component Value Date   HGBA1C 8.6 (H) 04/09/2023   Uncontrolled,, pt to continue current medical treatment farxiga 10 every day, glucotrol xl 10 every day, nph 30am, 40 pm, metformin 1000 bid, and refer optho as is due, and declines other change as plans to f/u at [facility]   Hives Mild to mod, for prednisone taper,  to f/u any worsening symptoms or concerns  Allergic rhinitis Mild to mod, for prednisone taper,  to f/u any worsening symptoms or concerns  B12 deficiency Lab Results  Component Value Date   VITAMINB12 331 09/19/2022  Low, to start oral replacement - b12 1000 mcg qd  Followup: Return in about 6 months (around 10/07/2023).  Oliver Barre, MD 04/10/2023 9:46 PM  [facility]

## 2023-04-10 ENCOUNTER — Encounter: Payer: Self-pay | Admitting: Internal Medicine

## 2023-04-10 DIAGNOSIS — L509 Urticaria, unspecified: Secondary | ICD-10-CM | POA: Insufficient documentation

## 2023-04-10 DIAGNOSIS — J309 Allergic rhinitis, unspecified: Secondary | ICD-10-CM | POA: Insufficient documentation

## 2023-04-10 DIAGNOSIS — E538 Deficiency of other specified B group vitamins: Secondary | ICD-10-CM | POA: Insufficient documentation

## 2023-04-10 NOTE — Telephone Encounter (Signed)
LVM for patient to Call back regarding labs.

## 2023-04-10 NOTE — Assessment & Plan Note (Signed)
Mild to mod, for prednisone taper, to f/u any worsening symptoms or concerns 

## 2023-04-10 NOTE — Assessment & Plan Note (Signed)
Lab Results  Component Value Date   VITAMINB12 331 09/19/2022   Low, to start oral replacement - b12 1000 mcg qd

## 2023-04-10 NOTE — Telephone Encounter (Signed)
Called and spoke with Pt, states understanding.

## 2023-04-10 NOTE — Assessment & Plan Note (Signed)
Last vitamin D Lab Results  Component Value Date   VD25OH >120 04/09/2023   overcontrolled, cont oral replacement reduced mon - wed - fri

## 2023-04-10 NOTE — Assessment & Plan Note (Addendum)
BP Readings from Last 3 Encounters:  04/09/23 122/80  03/26/23 110/60  03/13/23 110/80   Stable, pt to continue medical treatment hct 25 every day but STOP ACEI

## 2023-04-10 NOTE — Assessment & Plan Note (Addendum)
Lab Results  Component Value Date   HGBA1C 8.6 (H) 04/09/2023   Uncontrolled,, pt to continue current medical treatment farxiga 10 every day, glucotrol xl 10 every day, nph 30am, 40 pm, metformin 1000 bid, and refer optho as is due, and declines other change as plans to f/u at [facility]

## 2023-04-11 ENCOUNTER — Encounter: Payer: Self-pay | Admitting: Gastroenterology

## 2023-04-20 ENCOUNTER — Ambulatory Visit (INDEPENDENT_AMBULATORY_CARE_PROVIDER_SITE_OTHER): Payer: Managed Care, Other (non HMO) | Admitting: "Endocrinology

## 2023-04-20 ENCOUNTER — Encounter: Payer: Self-pay | Admitting: "Endocrinology

## 2023-04-20 VITALS — BP 122/80 | HR 78 | Ht 68.0 in | Wt 245.4 lb

## 2023-04-20 DIAGNOSIS — Z7984 Long term (current) use of oral hypoglycemic drugs: Secondary | ICD-10-CM | POA: Diagnosis not present

## 2023-04-20 DIAGNOSIS — E782 Mixed hyperlipidemia: Secondary | ICD-10-CM

## 2023-04-20 DIAGNOSIS — E1165 Type 2 diabetes mellitus with hyperglycemia: Secondary | ICD-10-CM | POA: Diagnosis not present

## 2023-04-20 DIAGNOSIS — Z794 Long term (current) use of insulin: Secondary | ICD-10-CM

## 2023-04-20 LAB — COMPREHENSIVE METABOLIC PANEL
ALT: 31 U/L (ref 0–53)
AST: 16 U/L (ref 0–37)
Albumin: 4.1 g/dL (ref 3.5–5.2)
Alkaline Phosphatase: 60 U/L (ref 39–117)
BUN: 30 mg/dL — ABNORMAL HIGH (ref 6–23)
CO2: 28 meq/L (ref 19–32)
Calcium: 9.4 mg/dL (ref 8.4–10.5)
Chloride: 99 meq/L (ref 96–112)
Creatinine, Ser: 1.41 mg/dL (ref 0.40–1.50)
GFR: 52.56 mL/min — ABNORMAL LOW (ref >60.00)
Glucose, Bld: 216 mg/dL — ABNORMAL HIGH (ref 70–99)
Potassium: 3.9 meq/L (ref 3.5–5.1)
Sodium: 134 meq/L — ABNORMAL LOW (ref 135–145)
Total Bilirubin: 0.3 mg/dL (ref 0.2–1.2)
Total Protein: 6.9 g/dL (ref 6.0–8.3)

## 2023-04-20 NOTE — Patient Instructions (Signed)

## 2023-04-20 NOTE — Progress Notes (Signed)
Outpatient Endocrinology Note Steven Calumet, MD  04/20/23   Steven [name] 09-25-1960 [id number]  Referring Provider: Corwin Levins, MD Primary Care Provider: Corwin Levins, MD Reason for consultation: Subjective   Assessment & Plan  Steven [name] was seen today for follow-up.  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, neuropathy  Lab Results  Component Value Date   GFR 51.26 (L) 04/09/2023   Hba1c goal less than 7, current Hba1c is  Lab Results  Component Value Date   HGBA1C 8.6 (H) 04/09/2023   Currently on: Farxiga 10 mg daily, metformin 1 g twice daily, glipizide XL 10 mg bid  Insulin regimen: 25 NPH am and 35 hs (takes after meals, instructed to take 15 min before break fast and dinner) Humalog 20 units after break fast 18 units after dinner  Check BG before meals Ordered DM education and instructed pt to bring all insulins to help with timings of intake as patient doesn't remember names of insulin  Use skin tac to allow dexcom to stay put  No known contraindications to any of above medications Glucagon discussed and prescribed with refills on 12/04/22  -Last LD and Tg are as follows: Lab Results  Component Value Date   LDLCALC 59 04/09/2023    Lab Results  Component Value Date   TRIG 246.0 (H) 04/09/2023   -on lovastatin 40 mg QD -Follow low fat diet and exercise   -Blood pressure goal <140/90 - Microalbumin/creatinine goal < 30 -Last MA/Cr is as follows: Lab Results  Component Value Date   MICROALBUR <0.7 09/19/2022   -on ACE/ARB lisinopril 5 mg every day  -diet changes including  salt restriction -limit eating outside -counseled BP targets per standards of diabetes care -uncontrolled blood pressure can lead to retinopathy, nephropathy and cardiovascular and atherosclerotic heart disease  Reviewed and counseled on: -A1C target -Blood sugar targets -Complications of uncontrolled diabetes  -Checking blood sugar before meals and bedtime and bring log next visit -All medications with mechanism of action and side effects -Hypoglycemia management: rule of 15's, Glucagon Emergency Kit and medical alert ID -low-carb low-fat plate-method diet -At least 20 minutes of physical activity per day -Annual dilated retinal eye exam and foot exam -compliance and follow up needs -follow up as scheduled or earlier if problem gets worse  Call if blood sugar is less than 70 or consistently above 250    Take a 15 gm snack of carbohydrate at bedtime before you go to sleep if your blood sugar is less than 100.    If you are going to fast after midnight for a test or procedure, ask your physician for instructions on how to reduce/decrease your insulin dose.    Call if blood sugar is less than 70 or consistently above 250  -Treating a low sugar by rule of 15  (15 gms of sugar every 15 min until sugar is more than 70) If you feel your sugar is low, test your sugar to be sure If your sugar is low (less than 70), then take 15 grams of a fast acting Carbohydrate (3-4 glucose tablets or  glucose gel or 4 ounces of juice or regular soda) Recheck your sugar 15 min after treating low to make sure it is more than 70 If sugar is still less than 70, treat again with 15 grams of carbohydrate          Don't drive the hour of hypoglycemia  If unconscious/unable to eat or drink by mouth, use glucagon injection or nasal spray baqsimi and call 911. Can repeat again in 15 min if still unconscious.  Return in about 4 weeks (around 05/18/2023).   I have reviewed current medications, nurse's notes,  allergies, vital signs, past medical and surgical history, family medical history, and social history for this encounter. Counseled patient on symptoms, examination findings, lab findings, imaging results, treatment decisions and monitoring and prognosis. The patient understood the recommendations and agrees with the treatment plan. All questions regarding treatment plan were fully answered.  Steven Martinez, MD  04/20/23    History of Present Illness Steven [name] is a 62 y.o. year old male who presents for follow up of Type II diabetes mellitus.  Nim Gelwicks was first diagnosed in 2010.   Diabetes education +  Home diabetes regimen: Farxiga 10 mg daily, metformin 1 g twice daily, glipizide XL 10 mg bid  Insulin regimen: 25 NPH, am and 35 hs Humalog 25 units before break fast  Previous history:     Previously on metformin and started on insulin in 10/21 He has only been on NPH A1c range in the last few years: 8.1-12.1  COMPLICATIONS -  MI/Stroke -  retinopathy +  neuropathy +  nephropathy  BLOOD SUGAR DATA  Dexcom 14 day   Average 144 GMI 6.7% Time in range 3% very high, 18% high 77% in range 1 low, <1 very low  Physical Exam  BP 122/80 (BP Location: Left Arm, Patient Position: Sitting, Cuff Size: Large)   Pulse 78   Ht 5\' 8"  (1.727 m)   Wt 245 lb 6.4 oz (111.3 kg)   SpO2 97%   BMI 37.31 kg/m    Constitutional: well developed, well nourished Head: normocephalic, atraumatic Eyes: sclera anicteric, no redness Neck: supple Lungs: normal respiratory effort Neurology: alert and oriented Skin: dry, no appreciable rashes Musculoskeletal: no appreciable defects Psychiatric: normal mood and affect Diabetic Foot Exam - Simple   No data filed      Current Medications Patient's Medications  New Prescriptions   No medications on file  Previous Medications   ALLOPURINOL (ZYLOPRIM) 100 MG TABLET    TAKE 1 TABLET BY MOUTH TWICE A DAY   CONTINUOUS BLOOD GLUC  SENSOR (DEXCOM G7 SENSOR) MISC    1 Device by Does not apply route as directed. Change sensor every 10 days   CYCLOBENZAPRINE (FLEXERIL) 5 MG TABLET    TAKE 1 TABLET BY MOUTH THREE TIMES A DAY AS NEEDED FOR MUSCLE SPASM   CYCLOBENZAPRINE (FLEXERIL) 5 MG TABLET    TAKE 1 TABLET BY MOUTH THREE TIMES A DAY AS NEEDED FOR MUSCLE SPASM   DAPAGLIFLOZIN PROPANEDIOL (FARXIGA) 10 MG TABS TABLET    Take 1 tablet (10 mg total) by mouth daily.   DICLOFENAC SODIUM (VOLTAREN) 1 % GEL    Apply 2 g topically 4 (four) times daily.   FERROUS SULFATE 324 (65 FE) MG TBEC    Take 1 tablet (325 mg total) by mouth daily.   GABAPENTIN (NEURONTIN) 100 MG CAPSULE    Take 2 capsules (200 mg total) by mouth at bedtime.   GLIPIZIDE (GLUCOTROL  XL) 10 MG 24 HR TABLET    TAKE 1 TABLET BY MOUTH TWICE A DAY   HUMULIN N 100 UNIT/ML INJECTION    Inject up to 30 units in morning and 40 units at bedtime   HYDROCHLOROTHIAZIDE (HYDRODIURIL) 25 MG TABLET    TAKE 1 TABLET BY MOUTH EVERY DAY   INDOMETHACIN (INDOCIN) 25 MG CAPSULE    TAKE 1 CAPSULE BY MOUTH TWICE A DAY AS NEEDED   INSULIN ASPART (NOVOLOG FLEXPEN) 100 UNIT/ML FLEXPEN    Inject 25 Units into the skin daily before breakfast.   INSULIN LISPRO (HUMALOG KWIKPEN) 100 UNIT/ML KWIKPEN    Inject 10 Units into the skin 3 (three) times daily.   INSULIN PEN NEEDLE (B-D ULTRAFINE III SHORT PEN) 31G X 8 MM MISC    USE AS DIRECTED TWICE A DAY   INSULIN PEN NEEDLE 31G X 6 MM MISC    Use as directed twice daily   INSULIN SYRINGE-NEEDLE U-100 (RELION INSULIN SYRINGE) 31G X 15/64" 0.5 ML MISC    Use to inject insulin twice a day.   LOVASTATIN (MEVACOR) 40 MG TABLET    Take 1 tablet (40 mg total) by mouth at bedtime.   MELOXICAM (MOBIC) 15 MG TABLET    TAKE 1 TABLET BY MOUTH EVERY DAY AS NEEDED FOR PAIN   METFORMIN (GLUCOPHAGE) 1000 MG TABLET    TAKE 1 TABLET BY MOUTH TWICE A DAY WITH FOOD   OSTOMY SUPPLIES (SKIN TAC ADHESIVE BARRIER WIPE) MISC    Use to help apply Dexcom sensor every 10 days    OVER THE COUNTER MEDICATION    Take 1 tablet by mouth 2 (two) times daily. Beet Root supplement   PANTOPRAZOLE (PROTONIX) 40 MG TABLET    TAKE 1 TABLET BY MOUTH EVERY DAY   PREDNISONE (DELTASONE) 10 MG TABLET    3 tabs by mouth per day for 3 days,2tabs per day for 3 days,1tab per day for 3 days   SOLIFENACIN (VESICARE) 5 MG TABLET    TAKE 1 TABLET (5 MG TOTAL) BY MOUTH DAILY.   TAMSULOSIN (FLOMAX) 0.4 MG CAPS CAPSULE    TAKE 1 CAPSULE BY MOUTH EVERY DAY   TRAMADOL (ULTRAM) 50 MG TABLET    TAKE 1 TABLET BY MOUTH EVERY 6 HOURS AS NEEDED  Modified Medications   No medications on file  Discontinued Medications   No medications on file    Allergies No Known Allergies  Past Medical History Past Medical History:  Diagnosis Date   Chicken pox    Diabetes mellitus without complication (HCC)    Essential hypertension    GERD (gastroesophageal reflux disease)    Gouty arthritis    Hypertension    Iron deficiency anemia    Vitamin D deficiency     Past Surgical History Past Surgical History:  Procedure Laterality Date   BIOPSY  12/05/2021   Procedure: BIOPSY;  Surgeon: Sherrilyn Rist, MD;  Location: [facility];  Service: Gastroenterology;;   ESOPHAGOGASTRODUODENOSCOPY N/A 12/05/2021   Procedure: ESOPHAGOGASTRODUODENOSCOPY (EGD);  Surgeon: Sherrilyn Rist, MD;  Location: [facility];  Service: Gastroenterology;  Laterality: N/A;   HOT HEMOSTASIS N/A 12/05/2021   Procedure: HOT HEMOSTASIS (ARGON PLASMA COAGULATION/BICAP);  Surgeon: Sherrilyn Rist, MD;  Location: [facility];  Service: Gastroenterology;  Laterality: N/A;   INGUINAL HERNIA REPAIR Right 12/19/2017   Procedure: HERNIA REPAIR INGUINAL RIGHT;  Surgeon: Abigail Miyamoto, MD;  Location: [facility];  Service: General;  Laterality: Right;   INSERTION  OF MESH Right 12/19/2017   Procedure: INSERTION OF MESH;  Surgeon: Abigail Miyamoto, MD;  Location: [facility];  Service: General;  Laterality: Right;   MASS EXCISION Right  12/19/2017   Procedure: EXCISION OF RIGHT SCROTAL MASS;  Surgeon: Abigail Miyamoto, MD;  Location: [facility];  Service: General;  Laterality: Right;   TONSILLECTOMY      Family History family history includes Diabetes in his mother; Heart disease in his mother; Hypertension in his father and mother; Stroke in his brother and father.  Social History Social History   Socioeconomic History   Marital status: Divorced    Spouse name: Not on file   Number of children: 1   Years of education: 12   Highest education level: 12th grade  Occupational History   Occupation: retired  Tobacco Use   Smoking status: Former    Current packs/day: 2.00    Average packs/day: 2.0 packs/day for 25.0 years (50.0 ttl pk-yrs)    Types: Cigarettes   Smokeless tobacco: Former    Types: Engineer, drilling   Vaping status: Never Used  Substance and Sexual Activity   Alcohol use: Not Currently   Drug use: No   Sexual activity: Yes    Birth control/protection: Condom  Other Topics Concern   Not on file  Social History Narrative   Fun: Just working   Social Determinants of Health   Financial Resource Strain: Medium Risk (04/07/2023)   Overall Financial Resource Strain (CARDIA)    Difficulty of Paying Living Expenses: Somewhat hard  Food Insecurity: Food Insecurity Present (04/07/2023)   Hunger Vital Sign    Worried About Running Out of Food in the Last Year: Sometimes true    Ran Out of Food in the Last Year: Sometimes true  Transportation Needs: No Transportation Needs (04/07/2023)   PRAPARE - Administrator, Civil Service (Medical): No    Lack of Transportation (Non-Medical): No  Physical Activity: Unknown (04/07/2023)   Exercise Vital Sign    Days of Exercise per Week: 0 days    Minutes of Exercise per Session: Not on file  Stress: No Stress Concern Present (04/07/2023)   [employer] of Occupational Health - Occupational Stress Questionnaire    Feeling of Stress : Not at all   Social Connections: Socially Isolated (04/07/2023)   Social Connection and Isolation Panel [NHANES]    Frequency of Communication with Friends and Family: Twice a week    Frequency of Social Gatherings with Friends and Family: Once a week    Attends Religious Services: Never    Database administrator or Organizations: No    Attends Engineer, structural: Not on file    Marital Status: Divorced  Intimate Partner Violence: Not on file    Lab Results  Component Value Date   HGBA1C 8.6 (H) 04/09/2023   HGBA1C 7.9 (A) 03/07/2023   HGBA1C 8.5 (H) 09/19/2022   Lab Results  Component Value Date   CHOL 140 04/09/2023   Lab Results  Component Value Date   HDL 32.00 (L) 04/09/2023   Lab Results  Component Value Date   LDLCALC 59 04/09/2023   Lab Results  Component Value Date   TRIG 246.0 (H) 04/09/2023   Lab Results  Component Value Date   CHOLHDL 4 04/09/2023   Lab Results  Component Value Date   CREATININE 1.44 04/09/2023   Lab Results  Component Value Date   GFR 51.26 (L) 04/09/2023   Lab Results  Component Value Date   MICROALBUR <0.7 09/19/2022      Component Value Date/Time   NA 135 04/09/2023 1009   K 4.2 04/09/2023 1009   CL 98 04/09/2023 1009   CO2 26 04/09/2023 1009   GLUCOSE 74 04/09/2023 1009   BUN 19 04/09/2023 1009   CREATININE 1.44 04/09/2023 1009   CALCIUM 9.6 04/09/2023 1009   PROT 7.4 04/09/2023 1009   ALBUMIN 4.3 04/09/2023 1009   AST 17 04/09/2023 1009   ALT 20 04/09/2023 1009   ALKPHOS 62 04/09/2023 1009   BILITOT 0.4 04/09/2023 1009   GFRNONAA 56 (L) 04/25/2022 1904   GFRAA >60 01/30/2018 2037      Latest Ref Rng & Units 04/09/2023   10:09 AM 09/19/2022    9:07 AM 08/28/2022    9:53 AM  BMP  Glucose 70 - 99 mg/dL 74  010  932   BUN 6 - 23 mg/dL 19  20  25    Creatinine 0.40 - 1.50 mg/dL 3.55  7.32  2.02   Sodium 135 - 145 mEq/L 135  135  130   Potassium 3.5 - 5.1 mEq/L 4.2  4.3  3.9   Chloride 96 - 112 mEq/L 98  102  101    CO2 19 - 32 mEq/L 26  25  24    Calcium 8.4 - 10.5 mg/dL 9.6  9.4  9.6        Component Value Date/Time   WBC 7.9 09/19/2022 0907   RBC 5.10 09/19/2022 0907   HGB 13.1 09/19/2022 0907   HCT 40.7 09/19/2022 0907   PLT 316.0 09/19/2022 0907   MCV 79.8 09/19/2022 0907   MCH 25.0 (L) 04/25/2022 1904   MCHC 32.1 09/19/2022 0907   RDW 18.0 (H) 09/19/2022 0907   LYMPHSABS 2.8 09/19/2022 0907   MONOABS 0.6 09/19/2022 0907   EOSABS 0.2 09/19/2022 0907   BASOSABS 0.0 09/19/2022 0907     Parts of this note may have been dictated using voice recognition software. There may be variances in spelling and vocabulary which are unintentional. Not all errors are proofread. Please notify the Thereasa Parkin if any discrepancies are noted or if the meaning of any statement is not clear.

## 2023-04-21 LAB — C-PEPTIDE: C-Peptide: 7.02 ng/mL — ABNORMAL HIGH (ref 0.80–3.85)

## 2023-04-30 ENCOUNTER — Other Ambulatory Visit: Payer: Self-pay | Admitting: Internal Medicine

## 2023-04-30 DIAGNOSIS — M25471 Effusion, right ankle: Secondary | ICD-10-CM

## 2023-04-30 DIAGNOSIS — M109 Gout, unspecified: Secondary | ICD-10-CM

## 2023-05-01 ENCOUNTER — Other Ambulatory Visit: Payer: Self-pay

## 2023-05-17 ENCOUNTER — Ambulatory Visit (INDEPENDENT_AMBULATORY_CARE_PROVIDER_SITE_OTHER): Payer: Commercial Managed Care - HMO | Admitting: Internal Medicine

## 2023-05-17 ENCOUNTER — Encounter: Payer: Self-pay | Admitting: Internal Medicine

## 2023-05-17 VITALS — BP 124/80 | HR 63 | Temp 98.3°F | Ht 68.0 in | Wt 243.0 lb

## 2023-05-17 DIAGNOSIS — L509 Urticaria, unspecified: Secondary | ICD-10-CM | POA: Diagnosis not present

## 2023-05-17 DIAGNOSIS — I10 Essential (primary) hypertension: Secondary | ICD-10-CM | POA: Diagnosis not present

## 2023-05-17 DIAGNOSIS — Z7984 Long term (current) use of oral hypoglycemic drugs: Secondary | ICD-10-CM

## 2023-05-17 DIAGNOSIS — E1142 Type 2 diabetes mellitus with diabetic polyneuropathy: Secondary | ICD-10-CM

## 2023-05-17 DIAGNOSIS — K1370 Unspecified lesions of oral mucosa: Secondary | ICD-10-CM | POA: Diagnosis not present

## 2023-05-17 DIAGNOSIS — E7800 Pure hypercholesterolemia, unspecified: Secondary | ICD-10-CM

## 2023-05-17 DIAGNOSIS — Z794 Long term (current) use of insulin: Secondary | ICD-10-CM

## 2023-05-17 MED ORDER — PREDNISONE 10 MG PO TABS: ORAL_TABLET | ORAL | 0 refills | Status: DC

## 2023-05-17 NOTE — Assessment & Plan Note (Signed)
Lab Results  Component Value Date   HGBA1C 8.6 (H) 04/09/2023   Uncontrolled,, pt to continue current medical treatment farxiga 10 every day, glipizide xl 10 every day, numulin n 30 u AM, 40 u PM, metfomrin 1000 bid and declines further change for now

## 2023-05-17 NOTE — Assessment & Plan Note (Signed)
BP Readings from Last 3 Encounters:  05/17/23 124/80  04/20/23 122/80  04/09/23 122/80   Stable, pt to continue medical treatment hct 25 qd

## 2023-05-17 NOTE — Patient Instructions (Signed)
Please take all new medication as prescribed - the prednisone  Please continue all other medications as before, and refills have been done if requested.  Please have the pharmacy call with any other refills you may need  Please keep your appointments with your specialists as you may have planned  You will be contacted regarding the referral for: Mouth Surgeon

## 2023-05-17 NOTE — Assessment & Plan Note (Signed)
Etiology unclear, may be developing chronic urticaria, for prednisone taper repeat, consider allergy referral

## 2023-05-17 NOTE — Progress Notes (Signed)
Patient ID: Steven [name], male   DOB: 06/28/1960, 62 y.o.   MRN: [id number]        Chief Complaint: follow up HTN, HLD and DM, oral lesion, hives       HPI:  Steven [name] is a 62 y.o. male here overall doing ok,  Pt denies chest pain, increased sob or doe, wheezing, orthopnea, PND, increased LE swelling, palpitations, dizziness or syncope.   Pt denies polydipsia, polyuria, or new focal neuro s/s.    Pt denies fever, wt loss, night sweats, loss of appetite, or other constitutional symptoms  Does have much milder but recurring hive rash and itching similar to last visit recurring.  Also has a hard bony lesion to the left upper posterior roof of mouth persistent developed x 2-3 wks,.   Has some very mild discomfort to eating but no other pain or fever or trauma.         Wt Readings from Last 3 Encounters:  05/17/23 243 lb (110.2 kg)  04/20/23 245 lb 6.4 oz (111.3 kg)  04/09/23 243 lb (110.2 kg)   BP Readings from Last 3 Encounters:  05/17/23 124/80  04/20/23 122/80  04/09/23 122/80         Past Medical History:  Diagnosis Date   Chicken pox    Diabetes mellitus without complication (HCC)    Essential hypertension    GERD (gastroesophageal reflux disease)    Gouty arthritis    Hypertension    Iron deficiency anemia    Vitamin D deficiency    Past Surgical History:  Procedure Laterality Date   BIOPSY  12/05/2021   Procedure: BIOPSY;  Surgeon: Sherrilyn Rist, MD;  Location: [facility];  Service: Gastroenterology;;   ESOPHAGOGASTRODUODENOSCOPY N/A 12/05/2021   Procedure: ESOPHAGOGASTRODUODENOSCOPY (EGD);  Surgeon: Sherrilyn Rist, MD;  Location: [facility];  Service: Gastroenterology;  Laterality: N/A;   HOT HEMOSTASIS N/A 12/05/2021   Procedure: HOT HEMOSTASIS (ARGON PLASMA COAGULATION/BICAP);  Surgeon: Sherrilyn Rist, MD;  Location: [facility];  Service: Gastroenterology;  Laterality: N/A;   INGUINAL HERNIA REPAIR Right 12/19/2017   Procedure: HERNIA REPAIR INGUINAL  RIGHT;  Surgeon: Abigail Miyamoto, MD;  Location: [facility];  Service: General;  Laterality: Right;   INSERTION OF MESH Right 12/19/2017   Procedure: INSERTION OF MESH;  Surgeon: Abigail Miyamoto, MD;  Location: [facility];  Service: General;  Laterality: Right;   MASS EXCISION Right 12/19/2017   Procedure: EXCISION OF RIGHT SCROTAL MASS;  Surgeon: Abigail Miyamoto, MD;  Location: [facility];  Service: General;  Laterality: Right;   TONSILLECTOMY      reports that he has quit smoking. His smoking use included cigarettes. He has a 50 pack-year smoking history. He has quit using smokeless tobacco.  His smokeless tobacco use included chew. He reports that he does not currently use alcohol. He reports that he does not use drugs. family history includes Diabetes in his mother; Heart disease in his mother; Hypertension in his father and mother; Stroke in his brother and father. No Known Allergies Current Outpatient Medications on File Prior to Visit  Medication Sig Dispense Refill   allopurinol (ZYLOPRIM) 100 MG tablet TAKE 1 TABLET BY MOUTH TWICE A DAY 180 tablet 3   Continuous Blood Gluc Sensor (DEXCOM G7 SENSOR) MISC 1 Device by Does not apply route as directed. Change sensor every 10 days 3 each 3   cyclobenzaprine (FLEXERIL) 5 MG tablet TAKE 1 TABLET BY MOUTH THREE TIMES A DAY AS NEEDED FOR  MUSCLE SPASM 90 tablet 5   cyclobenzaprine (FLEXERIL) 5 MG tablet TAKE 1 TABLET BY MOUTH THREE TIMES A DAY AS NEEDED FOR MUSCLE SPASM 60 tablet 5   dapagliflozin propanediol (FARXIGA) 10 MG TABS tablet Take 1 tablet (10 mg total) by mouth daily. 90 tablet 3   diclofenac Sodium (VOLTAREN) 1 % GEL Apply 2 g topically 4 (four) times daily. 50 g 0   ferrous sulfate 324 (65 Fe) MG TBEC Take 1 tablet (325 mg total) by mouth daily. 30 tablet 0   gabapentin (NEURONTIN) 100 MG capsule Take 2 capsules (200 mg total) by mouth at bedtime. 180 capsule 3   glipiZIDE (GLUCOTROL XL) 10 MG 24 hr tablet TAKE 1 TABLET BY MOUTH TWICE A DAY  60 tablet 4   HUMULIN N 100 UNIT/ML injection Inject up to 30 units in morning and 40 units at bedtime 70 mL 3   hydrochlorothiazide (HYDRODIURIL) 25 MG tablet TAKE 1 TABLET BY MOUTH EVERY DAY 30 tablet 11   indomethacin (INDOCIN) 25 MG capsule TAKE 1 CAPSULE BY MOUTH TWICE A DAY AS NEEDED 60 capsule 5   insulin aspart (NOVOLOG FLEXPEN) 100 UNIT/ML FlexPen Inject 25 Units into the skin daily before breakfast. 45 mL 0   insulin lispro (HUMALOG KWIKPEN) 100 UNIT/ML KwikPen Inject 10 Units into the skin 3 (three) times daily. 15 mL 1   Insulin Pen Needle (B-D ULTRAFINE III SHORT PEN) 31G X 8 MM MISC USE AS DIRECTED TWICE A DAY 100 each 2   Insulin Pen Needle 31G X 6 MM MISC Use as directed twice daily 100 each 3   Insulin Syringe-Needle U-100 (RELION INSULIN SYRINGE) 31G X 15/64" 0.5 ML MISC Use to inject insulin twice a day. 200 each 3   lovastatin (MEVACOR) 40 MG tablet Take 1 tablet (40 mg total) by mouth at bedtime. 90 tablet 3   meloxicam (MOBIC) 15 MG tablet TAKE 1 TABLET BY MOUTH EVERY DAY AS NEEDED FOR PAIN 30 tablet 5   metFORMIN (GLUCOPHAGE) 1000 MG tablet TAKE 1 TABLET BY MOUTH TWICE A DAY WITH FOOD 60 tablet 11   Ostomy Supplies (SKIN TAC ADHESIVE BARRIER WIPE) MISC Use to help apply Dexcom sensor every 10 days 100 each 1   OVER THE COUNTER MEDICATION Take 1 tablet by mouth 2 (two) times daily. Beet Root supplement     pantoprazole (PROTONIX) 40 MG tablet TAKE 1 TABLET BY MOUTH EVERY DAY 30 tablet 6   solifenacin (VESICARE) 5 MG tablet TAKE 1 TABLET (5 MG TOTAL) BY MOUTH DAILY. 30 tablet 11   tamsulosin (FLOMAX) 0.4 MG CAPS capsule TAKE 1 CAPSULE BY MOUTH EVERY DAY 30 capsule 11   traMADol (ULTRAM) 50 MG tablet TAKE 1 TABLET BY MOUTH EVERY 6 HOURS AS NEEDED 60 tablet 0   No current facility-administered medications on file prior to visit.        ROS:  All others reviewed and negative.  Objective        PE:  BP 124/80 (BP Location: Right Arm, Patient Position: Sitting, Cuff Size:  Normal)   Pulse 63   Temp 98.3 F (36.8 C) (Oral)   Ht 5\' 8"  (1.727 m)   Wt 243 lb (110.2 kg)   SpO2 97%   BMI 36.95 kg/m                 Constitutional: Pt appears in NAD               HENT: Head: NCAT.  Right Ear: External ear normal.                 Left Ear: External ear normal.                Eyes: . Pupils are equal, round, and reactive to light. Conjunctivae and EOM are normal;  mouth with hard tooth vs other bony lesion noted to the left upper posterior roof of mouth that seems to be eroding through the mucosa, ,minimal tender, no swelling o frank ulceration               Nose: without d/c or deformity               Neck: Neck supple. Gross normal ROM               Cardiovascular: Normal rate and regular rhythm.                 Pulmonary/Chest: Effort normal and breath sounds without rales or wheezing.                               Neurological: Pt is alert. At baseline orientation, motor grossly intact               Skin: Skin is warm. LE edema - none, mild hive like rash to arms               Psychiatric: Pt behavior is normal without agitation   Micro: none  Cardiac tracings I have personally interpreted today:  none  Pertinent Radiological findings (summarize): none   Lab Results  Component Value Date   WBC 7.9 09/19/2022   HGB 13.1 09/19/2022   HCT 40.7 09/19/2022   PLT 316.0 09/19/2022   GLUCOSE 216 (H) 04/20/2023   CHOL 140 04/09/2023   TRIG 246.0 (H) 04/09/2023   HDL 32.00 (L) 04/09/2023   LDLDIRECT 78.0 03/24/2022   LDLCALC 59 04/09/2023   ALT 31 04/20/2023   AST 16 04/20/2023   NA 134 (L) 04/20/2023   K 3.9 04/20/2023   CL 99 04/20/2023   CREATININE 1.41 04/20/2023   BUN 30 (H) 04/20/2023   CO2 28 04/20/2023   TSH 2.05 09/19/2022   PSA 0.50 09/19/2022   HGBA1C 8.6 (H) 04/09/2023   MICROALBUR <0.7 09/19/2022   Assessment/Plan:  Steven [name] is a 62 y.o. Black or African American [2] male with  has a past medical history of  Chicken pox, Diabetes mellitus without complication (HCC), Essential hypertension, GERD (gastroesophageal reflux disease), Gouty arthritis, Hypertension, Iron deficiency anemia, and Vitamin D deficiency.  Essential hypertension BP Readings from Last 3 Encounters:  05/17/23 124/80  04/20/23 122/80  04/09/23 122/80   Stable, pt to continue medical treatment hct 25 qd   Diabetes (HCC) Lab Results  Component Value Date   HGBA1C 8.6 (H) 04/09/2023   Uncontrolled,, pt to continue current medical treatment farxiga 10 every day, glipizide xl 10 every day, numulin n 30 u AM, 40 u PM, metfomrin 1000 bid and declines further change for now   HLD (hyperlipidemia) Lab Results  Component Value Date   LDLCALC 59 04/09/2023   Stable, pt to continue current statin lovastatin 40 qd   Mouth lesion Unusual bony lesion to left upper post roof of mouth approx 10 mm diameter eroding through mucosa - etiology unclear, I suspect is benign, but can't r/o other - for oral surgury referral  Hives  Etiology unclear, may be developing chronic urticaria, for prednisone taper repeat, consider allergy referral  Followup: Return if symptoms worsen or fail to improve.  Oliver Barre, MD 05/17/2023 7:48 PM [facility]  Primary Care - [facility]

## 2023-05-17 NOTE — Assessment & Plan Note (Signed)
Unusual bony lesion to left upper post roof of mouth approx 10 mm diameter eroding through mucosa - etiology unclear, I suspect is benign, but can't r/o other - for oral surgury referral

## 2023-05-17 NOTE — Assessment & Plan Note (Signed)
Lab Results  Component Value Date   LDLCALC 59 04/09/2023   Stable, pt to continue current statin lovastatin 40 qd

## 2023-05-18 ENCOUNTER — Ambulatory Visit (INDEPENDENT_AMBULATORY_CARE_PROVIDER_SITE_OTHER): Payer: Commercial Managed Care - HMO | Admitting: "Endocrinology

## 2023-05-18 ENCOUNTER — Encounter: Payer: Self-pay | Admitting: "Endocrinology

## 2023-05-18 VITALS — BP 120/60 | HR 88 | Ht 68.0 in | Wt 245.4 lb

## 2023-05-18 DIAGNOSIS — Z7984 Long term (current) use of oral hypoglycemic drugs: Secondary | ICD-10-CM | POA: Diagnosis not present

## 2023-05-18 DIAGNOSIS — E1165 Type 2 diabetes mellitus with hyperglycemia: Secondary | ICD-10-CM | POA: Diagnosis not present

## 2023-05-18 DIAGNOSIS — Z794 Long term (current) use of insulin: Secondary | ICD-10-CM

## 2023-05-18 DIAGNOSIS — E782 Mixed hyperlipidemia: Secondary | ICD-10-CM | POA: Diagnosis not present

## 2023-05-18 MED ORDER — TRULICITY 0.75 MG/0.5ML ~~LOC~~ SOAJ: 0.7500 mg | SUBCUTANEOUS | 0 refills | Status: DC

## 2023-05-18 NOTE — Patient Instructions (Addendum)
Farxiga 10 mg daily, metformin 1 g twice daily, glipizide XL 10 mg bid  25 NPH am and 35 hs- take 15 min before break fast and dinner Humalog 22 units after break fast, 18 units after dinner  Start Trulicity 0.75mg /week

## 2023-05-18 NOTE — Progress Notes (Signed)
Outpatient Endocrinology Note [facility] [place], [place]  05/18/23   Steven [name] 1960/06/22 [id number]  Referring Provider: Corwin Levins, MD Primary Care Provider: Corwin Levins, MD Reason for consultation: Subjective   Assessment & Plan  There are no diagnoses linked to this encounter.   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, neuropathy  Lab Results  Component Value Date   GFR 52.56 (L) 04/20/2023   Hba1c goal less than 7, current Hba1c is  Lab Results  Component Value Date   HGBA1C 8.6 (H) 04/09/2023   Currently on: Farxiga 10 mg daily, metformin 1 g twice daily, glipizide XL 10 mg bid  25 NPH am and 35 hs (take 15 min before break fast and dinner) Humalog 22 units after break fast, 18 units after dinner  Start Trulicity 0.75mg /week  Goal is to stop glipizide once settled on Trulicity  Check BG before meals Ordered DM education and instructed pt to bring all insulins to help with timings of intake as patient doesn't remember names of insulin  Use skin tac to allow dexcom to stay put  No known contraindications to any of above medications Glucagon discussed and prescribed with refills on 12/04/22  -Last LD and Tg are as follows: Lab Results  Component Value Date   LDLCALC 59 04/09/2023    Lab Results  Component Value Date   TRIG 246.0 (H) 04/09/2023   -on lovastatin 40 mg QD -Follow low fat diet and exercise   -Blood pressure goal <140/90 - Microalbumin/creatinine goal < 30 -Last MA/Cr is as follows: Lab Results  Component Value Date   MICROALBUR <0.7 09/19/2022   -on ACE/ARB lisinopril 5 mg every day  -diet changes including salt restriction -limit eating outside -counseled BP targets per standards of diabetes care -uncontrolled blood pressure can lead to retinopathy, nephropathy and cardiovascular and atherosclerotic heart disease  Reviewed and counseled on: -A1C target -Blood sugar targets -Complications of uncontrolled diabetes  -Checking  blood sugar before meals and bedtime and bring log next visit -All medications with mechanism of action and side effects -Hypoglycemia management: rule of 15's, Glucagon Emergency Kit and medical alert ID -low-carb low-fat plate-method diet -At least 20 minutes of physical activity per day -Annual dilated retinal eye exam and foot exam -compliance and follow up needs -follow up as scheduled or earlier if problem gets worse  Call if blood sugar is less than 70 or consistently above 250    Take a 15 gm snack of carbohydrate at bedtime before you go to sleep if your blood sugar is less than 100.    If you are going to fast after midnight for a test or procedure, ask your physician for instructions on how to reduce/decrease your insulin dose.    Call if blood sugar is less than 70 or consistently above 250  -Treating a low sugar by rule of 15  (15 gms of sugar every 15 min until sugar is more than 70) If you feel your sugar is low, test your sugar to be sure If your sugar is low (less than 70), then take 15 grams of a fast acting Carbohydrate (3-4 glucose tablets or glucose gel or 4 ounces of juice or regular soda) Recheck your sugar 15 min after treating low to make sure it is more than 70 If sugar is still less than 70, treat again with 15 grams of carbohydrate          Don't drive the hour of hypoglycemia  If unconscious/unable  to eat or drink by mouth, use glucagon injection or nasal spray baqsimi and call 911. Can repeat again in 15 min if still unconscious.  Return in about 4 weeks (around 06/15/2023).   I have reviewed current medications, nurse's notes, allergies, vital signs, past medical and surgical history, family medical history, and social history for this encounter. Counseled patient on symptoms, examination findings, lab findings, imaging results, treatment decisions and monitoring and prognosis. The patient understood the recommendations and agrees with the treatment plan. All  questions regarding treatment plan were fully answered.  [facility] [place], MD  05/18/23    History of Present Illness Steven [name] is a 62 y.o. year old male who presents for follow up of Type II diabetes mellitus.  Steven [name] was first diagnosed in 2010.   Diabetes education +  Home diabetes regimen: Farxiga 10 mg daily, metformin 1 g twice daily, glipizide XL 10 mg bid  Insulin regimen: 25 NPH am and 35 hs (takes after meals, instructed to take 15 min before break fast and dinner) Humalog 20 units after break fast 18 units after dinner   Previous history:     Previously on metformin and started on insulin in 10/21 He has only been on NPH A1c range in the last few years: 8.1-12.1  COMPLICATIONS -  MI/Stroke -  retinopathy +  neuropathy +  nephropathy  BLOOD SUGAR DATA  CGM interpretation: At today's visit, we reviewed her CGM downloads. The full report is scanned in the media. Reviewing the CGM trends, BG are elevated in morning and after dinner.  Physical Exam  BP 120/60   Pulse 88   Ht 5\' 8"  (1.727 m)   Wt 245 lb 6.4 oz (111.3 kg)   SpO2 97%   BMI 37.31 kg/m    Constitutional: well developed, well nourished Head: normocephalic, atraumatic Eyes: sclera anicteric, no redness Neck: supple Lungs: normal respiratory effort Neurology: alert and oriented Skin: dry, no appreciable rashes Musculoskeletal: no appreciable defects Psychiatric: normal mood and affect Diabetic Foot Exam - Simple   No data filed      Current Medications Patient's Medications  New Prescriptions   DULAGLUTIDE (TRULICITY) 0.75 MG/0.5ML SOAJ    Inject 0.75 mg into the skin once a week.  Previous Medications   ALLOPURINOL (ZYLOPRIM) 100 MG TABLET    TAKE 1 TABLET BY MOUTH TWICE A DAY   CONTINUOUS BLOOD GLUC SENSOR (DEXCOM G7 SENSOR) MISC    1 Device by Does not apply route as directed. Change sensor every 10 days   CYCLOBENZAPRINE (FLEXERIL) 5 MG TABLET    TAKE 1 TABLET BY MOUTH  THREE TIMES A DAY AS NEEDED FOR MUSCLE SPASM   CYCLOBENZAPRINE (FLEXERIL) 5 MG TABLET    TAKE 1 TABLET BY MOUTH THREE TIMES A DAY AS NEEDED FOR MUSCLE SPASM   DAPAGLIFLOZIN PROPANEDIOL (FARXIGA) 10 MG TABS TABLET    Take 1 tablet (10 mg total) by mouth daily.   DICLOFENAC SODIUM (VOLTAREN) 1 % GEL    Apply 2 g topically 4 (four) times daily.   FERROUS SULFATE 324 (65 FE) MG TBEC    Take 1 tablet (325 mg total) by mouth daily.   GABAPENTIN (NEURONTIN) 100 MG CAPSULE    Take 2 capsules (200 mg total) by mouth at bedtime.   GLIPIZIDE (GLUCOTROL XL) 10 MG 24 HR TABLET    TAKE 1 TABLET BY MOUTH TWICE A DAY   HUMULIN N 100 UNIT/ML INJECTION    Inject up to 30 units  in morning and 40 units at bedtime   HYDROCHLOROTHIAZIDE (HYDRODIURIL) 25 MG TABLET    TAKE 1 TABLET BY MOUTH EVERY DAY   INDOMETHACIN (INDOCIN) 25 MG CAPSULE    TAKE 1 CAPSULE BY MOUTH TWICE A DAY AS NEEDED   INSULIN ASPART (NOVOLOG FLEXPEN) 100 UNIT/ML FLEXPEN    Inject 25 Units into the skin daily before breakfast.   INSULIN LISPRO (HUMALOG KWIKPEN) 100 UNIT/ML KWIKPEN    Inject 10 Units into the skin 3 (three) times daily.   INSULIN PEN NEEDLE (B-D ULTRAFINE III SHORT PEN) 31G X 8 MM MISC    USE AS DIRECTED TWICE A DAY   INSULIN PEN NEEDLE 31G X 6 MM MISC    Use as directed twice daily   INSULIN SYRINGE-NEEDLE U-100 (RELION INSULIN SYRINGE) 31G X 15/64" 0.5 ML MISC    Use to inject insulin twice a day.   LOVASTATIN (MEVACOR) 40 MG TABLET    Take 1 tablet (40 mg total) by mouth at bedtime.   MELOXICAM (MOBIC) 15 MG TABLET    TAKE 1 TABLET BY MOUTH EVERY DAY AS NEEDED FOR PAIN   METFORMIN (GLUCOPHAGE) 1000 MG TABLET    TAKE 1 TABLET BY MOUTH TWICE A DAY WITH FOOD   OSTOMY SUPPLIES (SKIN TAC ADHESIVE BARRIER WIPE) MISC    Use to help apply Dexcom sensor every 10 days   OVER THE COUNTER MEDICATION    Take 1 tablet by mouth 2 (two) times daily. Beet Root supplement   PANTOPRAZOLE (PROTONIX) 40 MG TABLET    TAKE 1 TABLET BY MOUTH EVERY DAY    PREDNISONE (DELTASONE) 10 MG TABLET    3 tabs by mouth per day for 3 days,2tabs per day for 3 days,1tab per day for 3 days   SOLIFENACIN (VESICARE) 5 MG TABLET    TAKE 1 TABLET (5 MG TOTAL) BY MOUTH DAILY.   TAMSULOSIN (FLOMAX) 0.4 MG CAPS CAPSULE    TAKE 1 CAPSULE BY MOUTH EVERY DAY   TRAMADOL (ULTRAM) 50 MG TABLET    TAKE 1 TABLET BY MOUTH EVERY 6 HOURS AS NEEDED  Modified Medications   No medications on file  Discontinued Medications   No medications on file    Allergies No Known Allergies  Past Medical History Past Medical History:  Diagnosis Date   Chicken pox    Diabetes mellitus without complication (HCC)    Essential hypertension    GERD (gastroesophageal reflux disease)    Gouty arthritis    Hypertension    Iron deficiency anemia    Vitamin D deficiency     Past Surgical History Past Surgical History:  Procedure Laterality Date   BIOPSY  12/05/2021   Procedure: BIOPSY;  Surgeon: Sherrilyn Rist, MD;  Location: [facility];  Service: Gastroenterology;;   ESOPHAGOGASTRODUODENOSCOPY N/A 12/05/2021   Procedure: ESOPHAGOGASTRODUODENOSCOPY (EGD);  Surgeon: Sherrilyn Rist, MD;  Location: [facility];  Service: Gastroenterology;  Laterality: N/A;   HOT HEMOSTASIS N/A 12/05/2021   Procedure: HOT HEMOSTASIS (ARGON PLASMA COAGULATION/BICAP);  Surgeon: Sherrilyn Rist, MD;  Location: [facility];  Service: Gastroenterology;  Laterality: N/A;   INGUINAL HERNIA REPAIR Right 12/19/2017   Procedure: HERNIA REPAIR INGUINAL RIGHT;  Surgeon: Abigail Miyamoto, MD;  Location: [facility];  Service: General;  Laterality: Right;   INSERTION OF MESH Right 12/19/2017   Procedure: INSERTION OF MESH;  Surgeon: Abigail Miyamoto, MD;  Location: [facility];  Service: General;  Laterality: Right;   MASS EXCISION Right 12/19/2017  Procedure: EXCISION OF RIGHT SCROTAL MASS;  Surgeon: Abigail Miyamoto, MD;  Location: [facility];  Service: General;  Laterality: Right;   TONSILLECTOMY      Family  History family history includes Diabetes in his mother; Heart disease in his mother; Hypertension in his father and mother; Stroke in his brother and father.  Social History Social History   Socioeconomic History   Marital status: Divorced    Spouse name: Not on file   Number of children: 1   Years of education: 12   Highest education level: 12th grade  Occupational History   Occupation: retired  Tobacco Use   Smoking status: Former    Current packs/day: 2.00    Average packs/day: 2.0 packs/day for 25.0 years (50.0 ttl pk-yrs)    Types: Cigarettes   Smokeless tobacco: Former    Types: Engineer, drilling   Vaping status: Never Used  Substance and Sexual Activity   Alcohol use: Not Currently   Drug use: No   Sexual activity: Yes    Birth control/protection: Condom  Other Topics Concern   Not on file  Social History Narrative   Fun: Just working   Social Drivers of Corporate investment banker Strain: Low Risk  (05/16/2023)   Overall Financial Resource Strain (CARDIA)    Difficulty of Paying Living Expenses: Not very hard  Recent Concern: Financial Resource Strain - Medium Risk (04/07/2023)   Overall Financial Resource Strain (CARDIA)    Difficulty of Paying Living Expenses: Somewhat hard  Food Insecurity: No Food Insecurity (05/16/2023)   Hunger Vital Sign    Worried About Running Out of Food in the Last Year: Never true    Ran Out of Food in the Last Year: Never true  Recent Concern: Food Insecurity - Food Insecurity Present (04/07/2023)   Hunger Vital Sign    Worried About Running Out of Food in the Last Year: Sometimes true    Ran Out of Food in the Last Year: Sometimes true  Transportation Needs: No Transportation Needs (05/16/2023)   PRAPARE - Administrator, Civil Service (Medical): No    Lack of Transportation (Non-Medical): No  Physical Activity: Insufficiently Active (05/16/2023)   Exercise Vital Sign    Days of Exercise per Week: 2 days    Minutes  of Exercise per Session: 10 min  Stress: No Stress Concern Present (05/16/2023)   [employer] of Occupational Health - Occupational Stress Questionnaire    Feeling of Stress : Not at all  Social Connections: Moderately Isolated (05/16/2023)   Social Connection and Isolation Panel [NHANES]    Frequency of Communication with Friends and Family: More than three times a week    Frequency of Social Gatherings with Friends and Family: Once a week    Attends Religious Services: 1 to 4 times per year    Active Member of [employer] or Organizations: No    Attends Banker Meetings: Not on file    Marital Status: Divorced  Intimate Partner Violence: Not on file    Lab Results  Component Value Date   HGBA1C 8.6 (H) 04/09/2023   HGBA1C 7.9 (A) 03/07/2023   HGBA1C 8.5 (H) 09/19/2022   Lab Results  Component Value Date   CHOL 140 04/09/2023   Lab Results  Component Value Date   HDL 32.00 (L) 04/09/2023   Lab Results  Component Value Date   LDLCALC 59 04/09/2023   Lab Results  Component Value Date   TRIG 246.0 (  H) 04/09/2023   Lab Results  Component Value Date   CHOLHDL 4 04/09/2023   Lab Results  Component Value Date   CREATININE 1.41 04/20/2023   Lab Results  Component Value Date   GFR 52.56 (L) 04/20/2023   Lab Results  Component Value Date   MICROALBUR <0.7 09/19/2022      Component Value Date/Time   NA 134 (L) 04/20/2023 0841   K 3.9 04/20/2023 0841   CL 99 04/20/2023 0841   CO2 28 04/20/2023 0841   GLUCOSE 216 (H) 04/20/2023 0841   BUN 30 (H) 04/20/2023 0841   CREATININE 1.41 04/20/2023 0841   CALCIUM 9.4 04/20/2023 0841   PROT 6.9 04/20/2023 0841   ALBUMIN 4.1 04/20/2023 0841   AST 16 04/20/2023 0841   ALT 31 04/20/2023 0841   ALKPHOS 60 04/20/2023 0841   BILITOT 0.3 04/20/2023 0841   GFRNONAA 56 (L) 04/25/2022 1904   GFRAA >60 01/30/2018 2037      Latest Ref Rng & Units 04/20/2023    8:41 AM 04/09/2023   10:09 AM 09/19/2022    9:07 AM   BMP  Glucose 70 - 99 mg/dL 161  74  096   BUN 6 - 23 mg/dL 30  19  20    Creatinine 0.40 - 1.50 mg/dL 0.45  4.09  8.11   Sodium 135 - 145 mEq/L 134  135  135   Potassium 3.5 - 5.1 mEq/L 3.9  4.2  4.3   Chloride 96 - 112 mEq/L 99  98  102   CO2 19 - 32 mEq/L 28  26  25    Calcium 8.4 - 10.5 mg/dL 9.4  9.6  9.4        Component Value Date/Time   WBC 7.9 09/19/2022 0907   RBC 5.10 09/19/2022 0907   HGB 13.1 09/19/2022 0907   HCT 40.7 09/19/2022 0907   PLT 316.0 09/19/2022 0907   MCV 79.8 09/19/2022 0907   MCH 25.0 (L) 04/25/2022 1904   MCHC 32.1 09/19/2022 0907   RDW 18.0 (H) 09/19/2022 0907   LYMPHSABS 2.8 09/19/2022 0907   MONOABS 0.6 09/19/2022 0907   EOSABS 0.2 09/19/2022 0907   BASOSABS 0.0 09/19/2022 0907     Parts of this note may have been dictated using voice recognition software. There may be variances in spelling and vocabulary which are unintentional. Not all errors are proofread. Please notify the Thereasa Parkin if any discrepancies are noted or if the meaning of any statement is not clear.

## 2023-05-20 ENCOUNTER — Other Ambulatory Visit: Payer: Self-pay | Admitting: "Endocrinology

## 2023-05-20 DIAGNOSIS — E1165 Type 2 diabetes mellitus with hyperglycemia: Secondary | ICD-10-CM

## 2023-05-24 ENCOUNTER — Encounter: Payer: Self-pay | Admitting: "Endocrinology

## 2023-05-24 ENCOUNTER — Other Ambulatory Visit: Payer: Self-pay

## 2023-05-24 MED ORDER — DEXCOM G7 SENSOR MISC: 1.0000 | 3 refills | Status: DC

## 2023-05-31 ENCOUNTER — Other Ambulatory Visit: Payer: Self-pay | Admitting: Internal Medicine

## 2023-06-04 ENCOUNTER — Ambulatory Visit (INDEPENDENT_AMBULATORY_CARE_PROVIDER_SITE_OTHER): Payer: Commercial Managed Care - HMO | Admitting: Emergency Medicine

## 2023-06-04 ENCOUNTER — Encounter: Payer: Self-pay | Admitting: Emergency Medicine

## 2023-06-04 VITALS — BP 114/72 | HR 96 | Temp 98.0°F | Ht 68.0 in | Wt 238.8 lb

## 2023-06-04 DIAGNOSIS — K529 Noninfective gastroenteritis and colitis, unspecified: Secondary | ICD-10-CM | POA: Diagnosis not present

## 2023-06-04 LAB — COMPREHENSIVE METABOLIC PANEL
ALT: 38 U/L (ref 0–53)
AST: 17 U/L (ref 0–37)
Albumin: 4.6 g/dL (ref 3.5–5.2)
Alkaline Phosphatase: 63 U/L (ref 39–117)
BUN: 35 mg/dL — ABNORMAL HIGH (ref 6–23)
CO2: 22 meq/L (ref 19–32)
Calcium: 9.7 mg/dL (ref 8.4–10.5)
Chloride: 100 meq/L (ref 96–112)
Creatinine, Ser: 1.26 mg/dL (ref 0.40–1.50)
GFR: 60.1 mL/min (ref >60.00)
Glucose, Bld: 86 mg/dL (ref 70–99)
Potassium: 4 meq/L (ref 3.5–5.1)
Sodium: 133 meq/L — ABNORMAL LOW (ref 135–145)
Total Bilirubin: 0.5 mg/dL (ref 0.2–1.2)
Total Protein: 7.6 g/dL (ref 6.0–8.3)

## 2023-06-04 LAB — CBC WITH DIFFERENTIAL/PLATELET
Basophils Absolute: 0 10*3/uL (ref 0.0–0.1)
Basophils Relative: 0.2 % (ref 0.0–3.0)
Eosinophils Absolute: 0.3 10*3/uL (ref 0.0–0.7)
Eosinophils Relative: 3.3 % (ref 0.0–5.0)
HCT: 46.2 % (ref 39.0–52.0)
Hemoglobin: 14.6 g/dL (ref 13.0–17.0)
Lymphocytes Relative: 24.8 % (ref 12.0–46.0)
Lymphs Abs: 2.2 10*3/uL (ref 0.7–4.0)
MCHC: 31.7 g/dL (ref 30.0–36.0)
MCV: 79.8 fL (ref 78.0–100.0)
Monocytes Absolute: 1 10*3/uL (ref 0.1–1.0)
Monocytes Relative: 11.9 % (ref 3.0–12.0)
Neutro Abs: 5.2 10*3/uL (ref 1.4–7.7)
Neutrophils Relative %: 59.8 % (ref 43.0–77.0)
Platelets: 332 10*3/uL (ref 150.0–400.0)
RBC: 5.79 Mil/uL (ref 4.22–5.81)
RDW: 18.8 % — ABNORMAL HIGH (ref 11.5–15.5)
WBC: 8.8 10*3/uL (ref 4.0–10.5)

## 2023-06-04 NOTE — Patient Instructions (Signed)
 Take Imodium OTC as needed.  Diarrhea, Adult Diarrhea is when you pass loose and sometimes watery poop (stool) often. Diarrhea can make you feel weak and cause you to lose water in your body (get dehydrated). Losing water in your body can cause you to: Feel tired and thirsty. Have a dry mouth. Go pee (urinate) less often. Diarrhea often lasts 2-3 days. It can last longer if it is a sign of something more serious. Be sure to treat your diarrhea as told by your doctor. Follow these instructions at home: Eating and drinking     Follow these instructions as told by your doctor: Take an ORS (oral rehydration solution). This is a drink that helps you replace fluids and minerals your body lost. It is sold at pharmacies and stores. Drink enough fluid to keep your pee (urine) pale yellow. Drink fluids such as: Water. You can also get fluids by sucking on ice chips. Diluted fruit juice. Low-calorie sports drinks. Milk. Avoid drinking fluids that have a lot of sugar or caffeine in them. These include soda, energy drinks, and regular sports drinks. Avoid alcohol. Eat bland, easy-to-digest foods in small amounts as you are able. These foods include: Bananas. Applesauce. Rice. Low-fat (lean) meats. Toast. Crackers. Avoid spicy or fatty foods.  Medicines Take over-the-counter and prescription medicines only as told by your doctor. If you were prescribed antibiotics, take them as told by your doctor. Do not stop taking them even if you start to feel better. General instructions  Wash your hands often using soap and water for 20 seconds. If soap and water are not available, use hand sanitizer. Others in your home should wash their hands as well. Wash your hands: After using the toilet or changing a diaper. Before preparing, cooking, or serving food. While caring for a sick person. While visiting someone in a hospital. Rest at home while you get better. Take a warm bath to help with any  burning or pain from having diarrhea. Watch your condition for any changes. Contact a doctor if: You have a fever. Your diarrhea gets worse. You have new symptoms. You vomit every time you eat or drink. You feel light-headed, dizzy, or you have a headache. You have muscle cramps. You have signs of losing too much water in your body, such as: Dark pee, very little pee, or no pee. Cracked lips. Dry mouth. Sunken eyes. Sleepiness. Weakness. You have bloody or black poop or poop that looks like tar. You have very bad pain, cramping, or bloating in your belly (abdomen). Your skin feels cold and clammy. You feel confused. Get help right away if: You have chest pain. Your heart is beating very quickly. You have trouble breathing or you are breathing very quickly. You feel very weak or you faint. These symptoms may be an emergency. Get help right away. Call 911. Do not wait to see if the symptoms will go away. Do not drive yourself to the hospital. This information is not intended to replace advice given to you by your health care provider. Make sure you discuss any questions you have with your health care provider. Document Revised: 11/01/2021 Document Reviewed: 11/01/2021 Elsevier Patient Education  2024 [employer].

## 2023-06-04 NOTE — Progress Notes (Signed)
 [name] Dec 63 y.o.   Chief Complaint  Patient presents with   Diarrhea    Patients this started Thursday, upset stomach. Patient states no headache, no fever, nausea, threw up 2 days ago, slight headache. Pt states he hasn't taken anything for the symptom     HISTORY OF PRESENT ILLNESS: Acute problem visit today.  Patient of Dr. Lynwood Rush This is a 63 y.o. male complaining of watery nonbloody diarrhea that started 3 days ago with upset stomach Denies fever or chill.  Denies nausea or vomiting. Symptoms started soon after eating chicken dumplings last Thursday No other associated symptoms.  No other complaints or medical concerns today.  Diabetic on insulin .  No new medications.  Diarrhea  Associated symptoms include abdominal pain. Pertinent negatives include no chills, coughing, fever, headaches or vomiting.     Prior to Admission medications   Medication Sig Start Date End Date Taking? Authorizing Provider  allopurinol  (ZYLOPRIM ) 100 MG tablet TAKE 1 TABLET BY MOUTH TWICE A DAY 10/17/22  Yes Rush Lynwood ORN, MD  Continuous Glucose Sensor (DEXCOM G7 SENSOR) MISC 1 Device by Does not apply route as directed. Change sensor every 10 days 05/24/23  Yes Motwani, Komal, MD  cyclobenzaprine  (FLEXERIL ) 5 MG tablet TAKE 1 TABLET BY MOUTH THREE TIMES A DAY AS NEEDED FOR MUSCLE SPASM 03/30/23  Yes Rush Lynwood ORN, MD  cyclobenzaprine  (FLEXERIL ) 5 MG tablet TAKE 1 TABLET BY MOUTH THREE TIMES A DAY AS NEEDED FOR MUSCLE SPASM 03/30/23  Yes Geofm Glade PARAS, MD  dapagliflozin  propanediol (FARXIGA ) 10 MG TABS tablet Take 1 tablet (10 mg total) by mouth daily. 02/15/23  Yes Motwani, Komal, MD  diclofenac  Sodium (VOLTAREN ) 1 % GEL Apply 2 g topically 4 (four) times daily. 08/31/20  Yes Eudelia Maude SAUNDERS, PA-C  Dulaglutide  (TRULICITY ) 0.75 MG/0.5ML SOAJ Inject 0.75 mg into the skin once a week. 05/18/23  Yes Motwani, Komal, MD  ferrous sulfate  324 (65 Fe) MG TBEC Take 1 tablet (325 mg total) by mouth  daily. 11/14/22  Yes Claudene Arthea HERO, DO  gabapentin  (NEURONTIN ) 100 MG capsule Take 2 capsules (200 mg total) by mouth at bedtime. 12/05/22  Yes Claudene Arthea M, DO  glipiZIDE  (GLUCOTROL  XL) 10 MG 24 hr tablet TAKE 1 TABLET BY MOUTH TWICE A DAY 02/26/23  Yes Rush Lynwood ORN, MD  HUMULIN  N 100 UNIT/ML injection Inject up to 30 units in morning and 40 units at bedtime 03/30/23  Yes Motwani, Komal, MD  hydrochlorothiazide  (HYDRODIURIL ) 25 MG tablet TAKE 1 TABLET BY MOUTH EVERY DAY 03/30/23  Yes Rush Lynwood ORN, MD  indomethacin  (INDOCIN ) 25 MG capsule TAKE 1 CAPSULE BY MOUTH TWICE A DAY AS NEEDED 05/01/23  Yes Rush Lynwood ORN, MD  insulin  aspart (NOVOLOG  FLEXPEN) 100 UNIT/ML FlexPen Inject 25 Units into the skin daily before breakfast. 04/03/23  Yes Motwani, Komal, MD  insulin  lispro (HUMALOG  KWIKPEN) 100 UNIT/ML KwikPen INJECT 10 UNITS INTO THE SKIN 3 (THREE) TIMES DAILY. 05/20/23  Yes Motwani, Komal, MD  Insulin  Pen Needle (B-D ULTRAFINE III SHORT PEN) 31G X 8 MM MISC USE AS DIRECTED TWICE A DAY 12/04/22  Yes Motwani, Komal, MD  Insulin  Pen Needle 31G X 6 MM MISC Use as directed twice daily 12/14/22  Yes Motwani, Komal, MD  Insulin  Syringe-Needle U-100 (RELION INSULIN  SYRINGE) 31G X 15/64 0.5 ML MISC Use to inject insulin  twice a day. 12/28/22  Yes Von Pacific, MD  lovastatin  (MEVACOR ) 40 MG tablet Take 1 tablet (40 mg total) by mouth  at bedtime. 03/13/22  Yes Norleen Lynwood ORN, MD  meloxicam  (MOBIC ) 15 MG tablet TAKE 1 TABLET BY MOUTH EVERY DAY AS NEEDED FOR PAIN 05/01/23  Yes Norleen Lynwood ORN, MD  metFORMIN  (GLUCOPHAGE ) 1000 MG tablet TAKE 1 TABLET BY MOUTH TWICE A DAY WITH FOOD 03/14/23  Yes Norleen Lynwood ORN, MD  Ostomy Supplies (SKIN TAC ADHESIVE BARRIER WIPE) MISC Use to help apply Dexcom sensor every 10 days 12/28/22  Yes Von Pacific, MD  OVER THE COUNTER MEDICATION Take 1 tablet by mouth 2 (two) times daily. Beet Root supplement   Yes [provider]  pantoprazole  (PROTONIX ) 40 MG tablet TAKE 1 TABLET BY MOUTH  EVERY DAY 03/26/23  Yes Danis, Victory LITTIE MOULD, MD  predniSONE  (DELTASONE ) 10 MG tablet 3 tabs by mouth per day for 3 days,2tabs per day for 3 days,1tab per day for 3 days 05/17/23  Yes Norleen Lynwood ORN, MD  solifenacin  (VESICARE ) 5 MG tablet TAKE 1 TABLET (5 MG TOTAL) BY MOUTH DAILY. 03/14/23  Yes Norleen Lynwood ORN, MD  tamsulosin  (FLOMAX ) 0.4 MG CAPS capsule TAKE 1 CAPSULE BY MOUTH EVERY DAY 03/21/23  Yes Norleen Lynwood ORN, MD  traMADol  (ULTRAM ) 50 MG tablet TAKE 1 TABLET BY MOUTH EVERY 6 HOURS AS NEEDED 05/31/23  Yes Norleen Lynwood ORN, MD    No Known Allergies  Patient Active Problem List   Diagnosis Date Noted   Mouth lesion 05/17/2023   Hives 04/10/2023   Allergic rhinitis 04/10/2023   B12 deficiency 04/10/2023   Nocturia 09/29/2022   Orchitis, left 03/25/2022   OAB (overactive bladder) 03/25/2022   Lateral epicondylitis, right elbow 01/03/2022   Peripheral edema 04/06/2021   Right foot pain 03/29/2021   Degenerative joint disease of knee, right 09/02/2020   HLD (hyperlipidemia) 05/18/2020   Fatigue 05/18/2020   Patellofemoral arthritis of left knee 05/06/2020   Left carpal tunnel syndrome 05/06/2020   Acute bursitis of left shoulder 03/30/2020   Diabetes (HCC) 01/29/2020   Iron  deficiency anemia 11/06/2019   Vitamin D  deficiency 11/06/2019   Hypersomnolence 10/19/2018   GERD (gastroesophageal reflux disease) 10/18/2018   Anemia 04/01/2018   Neck pain 04/01/2018   Ganglion cyst 03/15/2018   Acute pain of left knee 03/15/2018   Sebaceous cyst 03/15/2018   Mass of right inguinal region s/p excision 12/19/2017 12/21/2017   Incarcerated right inguinal hernia s/p repair 12/19/2017 12/18/2017   Venous insufficiency 12/10/2017   Encounter for well adult exam with abnormal findings 06/11/2017   Chronic idiopathic gout involving toe of left foot without tophus 11/16/2016   Chest wall pain 07/02/2015   Essential hypertension 10/14/2014    Past Medical History:  Diagnosis Date   Chicken pox     Diabetes mellitus without complication (HCC)    Essential hypertension    GERD (gastroesophageal reflux disease)    Gouty arthritis    Hypertension    Iron  deficiency anemia    Vitamin D  deficiency     Past Surgical History:  Procedure Laterality Date   BIOPSY  12/05/2021   Procedure: BIOPSY;  Surgeon: Legrand Victory LITTIE MOULD, MD;  Location: [facility];  Service: Gastroenterology;;   ESOPHAGOGASTRODUODENOSCOPY N/A 12/05/2021   Procedure: ESOPHAGOGASTRODUODENOSCOPY (EGD);  Surgeon: Legrand Victory LITTIE MOULD, MD;  Location: [facility];  Service: Gastroenterology;  Laterality: N/A;   HOT HEMOSTASIS N/A 12/05/2021   Procedure: HOT HEMOSTASIS (ARGON PLASMA COAGULATION/BICAP);  Surgeon: Legrand Victory LITTIE MOULD, MD;  Location: [facility];  Service: Gastroenterology;  Laterality: N/A;   INGUINAL HERNIA REPAIR  Right 12/19/2017   Procedure: HERNIA REPAIR INGUINAL RIGHT;  Surgeon: Vernetta Berg, MD;  Location: [facility];  Service: General;  Laterality: Right;   INSERTION OF MESH Right 12/19/2017   Procedure: INSERTION OF MESH;  Surgeon: Vernetta Berg, MD;  Location: [facility];  Service: General;  Laterality: Right;   MASS EXCISION Right 12/19/2017   Procedure: EXCISION OF RIGHT SCROTAL MASS;  Surgeon: Vernetta Berg, MD;  Location: [facility];  Service: General;  Laterality: Right;   TONSILLECTOMY      Social History   Socioeconomic History   Marital status: Divorced    Spouse name: Not on file   Number of children: 1   Years of education: 12   Highest education level: 12th grade  Occupational History   Occupation: retired  Tobacco Use   Smoking status: Former    Current packs/day: 2.00    Average packs/day: 2.0 packs/day for 25.0 years (50.0 ttl pk-yrs)    Types: Cigarettes   Smokeless tobacco: Former    Types: Engineer, Drilling   Vaping status: Never Used  Substance and Sexual Activity   Alcohol use: Not Currently   Drug use: No   Sexual activity: Yes    Birth control/protection: Condom   Other Topics Concern   Not on file  Social History Narrative   Fun: Just working   Social Drivers of Corporate Investment Banker Strain: Low Risk  (05/16/2023)   Overall Financial Resource Strain (CARDIA)    Difficulty of Paying Living Expenses: Not very hard  Recent Concern: Financial Resource Strain - Medium Risk (04/07/2023)   Overall Financial Resource Strain (CARDIA)    Difficulty of Paying Living Expenses: Somewhat hard  Food Insecurity: No Food Insecurity (05/16/2023)   Hunger Vital Sign    Worried About Running Out of Food in the Last Year: Never true    Ran Out of Food in the Last Year: Never true  Recent Concern: Food Insecurity - Food Insecurity Present (04/07/2023)   Hunger Vital Sign    Worried About Running Out of Food in the Last Year: Sometimes true    Ran Out of Food in the Last Year: Sometimes true  Transportation Needs: No Transportation Needs (05/16/2023)   PRAPARE - Administrator, Civil Service (Medical): No    Lack of Transportation (Non-Medical): No  Physical Activity: Insufficiently Active (05/16/2023)   Exercise Vital Sign    Days of Exercise per Week: 2 days    Minutes of Exercise per Session: 10 min  Stress: No Stress Concern Present (05/16/2023)   [employer] of Occupational Health - Occupational Stress Questionnaire    Feeling of Stress : Not at all  Social Connections: Moderately Isolated (05/16/2023)   Social Connection and Isolation Panel [NHANES]    Frequency of Communication with Friends and Family: More than three times a week    Frequency of Social Gatherings with Friends and Family: Once a week    Attends Religious Services: 1 to 4 times per year    Active Member of [employer] or Organizations: No    Attends Engineer, Structural: Not on file    Marital Status: Divorced  Catering Manager Violence: Not on file    Family History  Problem Relation Age of Onset   Heart disease Mother    Hypertension Mother     Diabetes Mother    Stroke Father    Hypertension Father    Stroke Brother      Review of  Systems  Constitutional: Negative.  Negative for chills and fever.  HENT: Negative.  Negative for congestion and sore throat.   Respiratory: Negative.  Negative for cough and shortness of breath.   Gastrointestinal:  Positive for abdominal pain and diarrhea. Negative for blood in stool, melena, nausea and vomiting.  Genitourinary: Negative.  Negative for dysuria and hematuria.  Skin: Negative.  Negative for rash.  Neurological: Negative.  Negative for dizziness and headaches.  All other systems reviewed and are negative.   Vitals:   06/04/23 1456  BP: 114/72  Pulse: 96  Temp: 98 F (36.7 C)  SpO2: 96%    Physical Exam Vitals reviewed.  Constitutional:      Appearance: Normal appearance.  HENT:     Head: Normocephalic.     Mouth/Throat:     Mouth: Mucous membranes are moist.     Pharynx: Oropharynx is clear.  Eyes:     Extraocular Movements: Extraocular movements intact.     Conjunctiva/sclera: Conjunctivae normal.     Pupils: Pupils are equal, round, and reactive to light.  Cardiovascular:     Rate and Rhythm: Normal rate and regular rhythm.     Pulses: Normal pulses.     Heart sounds: Normal heart sounds.  Pulmonary:     Effort: Pulmonary effort is normal.     Breath sounds: Normal breath sounds.  Abdominal:     Palpations: Abdomen is soft.     Tenderness: There is no abdominal tenderness.  Musculoskeletal:     Cervical back: No tenderness.  Lymphadenopathy:     Cervical: No cervical adenopathy.  Skin:    General: Skin is warm and dry.     Capillary Refill: Capillary refill takes less than 2 seconds.  Neurological:     General: No focal deficit present.     Mental Status: He is alert and oriented to person, place, and time.  Psychiatric:        Mood and Affect: Mood normal.        Behavior: Behavior normal.      ASSESSMENT & PLAN: A total of 35 minutes was  spent with the patient and counseling/coordination of care regarding preparing for this visit, review of most recent office visit notes, review of multiple chronic medical conditions under management, review of all medications, diagnosis of gastroenteritis and management, diarrhea management, need to stay well-hydrated, education on nutrition, prognosis, documentation and need for follow-up if no better or worse during the next several days.  Problem List Items Addressed This Visit       Digestive   Acute gastroenteritis - Primary   Clinically stable.  Benign abdominal examination. Afebrile with stable vital signs No red flag signs or symptoms Recommend blood work today Advised to stay well-hydrated and follow BRAT diet. ED precautions given Recommend over-the-counter Imodium as needed Advised to contact the office if no better or worse during the next several days      Relevant Orders   CBC with Differential/Platelet   Comprehensive metabolic panel   Patient Instructions  Take Imodium OTC as needed.  Diarrhea, Adult Diarrhea is when you pass loose and sometimes watery poop (stool) often. Diarrhea can make you feel weak and cause you to lose water in your body (get dehydrated). Losing water in your body can cause you to: Feel tired and thirsty. Have a dry mouth. Go pee (urinate) less often. Diarrhea often lasts 2-3 days. It can last longer if it is a sign of something more serious. Be  sure to treat your diarrhea as told by your doctor. Follow these instructions at home: Eating and drinking     Follow these instructions as told by your doctor: Take an ORS (oral rehydration solution). This is a drink that helps you replace fluids and minerals your body lost. It is sold at pharmacies and stores. Drink enough fluid to keep your pee (urine) pale yellow. Drink fluids such as: Water. You can also get fluids by sucking on ice chips. Diluted fruit juice. Low-calorie sports  drinks. Milk. Avoid drinking fluids that have a lot of sugar or caffeine in them. These include soda, energy drinks, and regular sports drinks. Avoid alcohol. Eat bland, easy-to-digest foods in small amounts as you are able. These foods include: Bananas. Applesauce. Rice. Low-fat (lean) meats. Toast. Crackers. Avoid spicy or fatty foods.  Medicines Take over-the-counter and prescription medicines only as told by your doctor. If you were prescribed antibiotics, take them as told by your doctor. Do not stop taking them even if you start to feel better. General instructions  Wash your hands often using soap and water for 20 seconds. If soap and water are not available, use hand sanitizer. Others in your home should wash their hands as well. Wash your hands: After using the toilet or changing a diaper. Before preparing, cooking, or serving food. While caring for a sick person. While visiting someone in a hospital. Rest at home while you get better. Take a warm bath to help with any burning or pain from having diarrhea. Watch your condition for any changes. Contact a doctor if: You have a fever. Your diarrhea gets worse. You have new symptoms. You vomit every time you eat or drink. You feel light-headed, dizzy, or you have a headache. You have muscle cramps. You have signs of losing too much water in your body, such as: Dark pee, very little pee, or no pee. Cracked lips. Dry mouth. Sunken eyes. Sleepiness. Weakness. You have bloody or black poop or poop that looks like tar. You have very bad pain, cramping, or bloating in your belly (abdomen). Your skin feels cold and clammy. You feel confused. Get help right away if: You have chest pain. Your heart is beating very quickly. You have trouble breathing or you are breathing very quickly. You feel very weak or you faint. These symptoms may be an emergency. Get help right away. Call 911. Do not wait to see if the symptoms will  go away. Do not drive yourself to the hospital. This information is not intended to replace advice given to you by your health care provider. Make sure you discuss any questions you have with your health care provider. Document Revised: 11/01/2021 Document Reviewed: 11/01/2021 Elsevier Patient Education  2024 Elsevier Inc.     Emil Schaumann, MD [facility]

## 2023-06-04 NOTE — Assessment & Plan Note (Signed)
 Clinically stable.  Benign abdominal examination. Afebrile with stable vital signs No red flag signs or symptoms Recommend blood work today Advised to stay well-hydrated and follow BRAT diet. ED precautions given Recommend over-the-counter Imodium as needed Advised to contact the office if no better or worse during the next several days

## 2023-06-05 NOTE — Progress Notes (Signed)
 This has to continue running its course for now.  Thanks.

## 2023-06-07 ENCOUNTER — Other Ambulatory Visit: Payer: Self-pay | Admitting: "Endocrinology

## 2023-06-07 DIAGNOSIS — E1165 Type 2 diabetes mellitus with hyperglycemia: Secondary | ICD-10-CM

## 2023-06-11 NOTE — Progress Notes (Deleted)
 [name] [address] [place] [address] Phone: [phone] Subjective:    I'm seeing this patient by the request  of:  Norleen Lynwood ORN, MD  CC:   YEP:[id number]  03/13/2023 Chronic problem with worsening symptoms.  Discussed icing regimen and home exercises, patient is wearing brace but does seem to be wearing at a very quick pace.  Discussed with patient about which activities to do and which ones to avoid.  Increase activity slowly.  Follow-up again in 12 weeks.      Update 06/12/2023 Steven [name] is a 63 y.o. male coming in with complaint of R knee pain. Patient states       Past Medical History:  Diagnosis Date   Chicken pox    Diabetes mellitus without complication (HCC)    Essential hypertension    GERD (gastroesophageal reflux disease)    Gouty arthritis    Hypertension    Iron  deficiency anemia    Vitamin D  deficiency    Past Surgical History:  Procedure Laterality Date   BIOPSY  12/05/2021   Procedure: BIOPSY;  Surgeon: Legrand Victory LITTIE DOUGLAS, MD;  Location: [facility];  Service: Gastroenterology;;   ESOPHAGOGASTRODUODENOSCOPY N/A 12/05/2021   Procedure: ESOPHAGOGASTRODUODENOSCOPY (EGD);  Surgeon: Legrand Victory LITTIE DOUGLAS, MD;  Location: [facility];  Service: Gastroenterology;  Laterality: N/A;   HOT HEMOSTASIS N/A 12/05/2021   Procedure: HOT HEMOSTASIS (ARGON PLASMA COAGULATION/BICAP);  Surgeon: Legrand Victory LITTIE DOUGLAS, MD;  Location: [facility];  Service: Gastroenterology;  Laterality: N/A;   INGUINAL HERNIA REPAIR Right 12/19/2017   Procedure: HERNIA REPAIR INGUINAL RIGHT;  Surgeon: Vernetta Berg, MD;  Location: [facility];  Service: General;  Laterality: Right;   INSERTION OF MESH Right 12/19/2017   Procedure: INSERTION OF MESH;  Surgeon: Vernetta Berg, MD;  Location: [facility];  Service: General;  Laterality: Right;   MASS EXCISION Right 12/19/2017   Procedure: EXCISION OF RIGHT SCROTAL MASS;  Surgeon: Vernetta Berg, MD;   Location: [facility];  Service: General;  Laterality: Right;   TONSILLECTOMY     Social History   Socioeconomic History   Marital status: Divorced    Spouse name: Not on file   Number of children: 1   Years of education: 12   Highest education level: 12th grade  Occupational History   Occupation: retired  Tobacco Use   Smoking status: Former    Current packs/day: 2.00    Average packs/day: 2.0 packs/day for 25.0 years (50.0 ttl pk-yrs)    Types: Cigarettes   Smokeless tobacco: Former    Types: Engineer, Drilling   Vaping status: Never Used  Substance and Sexual Activity   Alcohol use: Not Currently   Drug use: No   Sexual activity: Yes    Birth control/protection: Condom  Other Topics Concern   Not on file  Social History Narrative   Fun: Just working   Social Drivers of Corporate Investment Banker Strain: Low Risk  (05/16/2023)   Overall Financial Resource Strain (CARDIA)    Difficulty of Paying Living Expenses: Not very hard  Recent Concern: Financial Resource Strain - Medium Risk (04/07/2023)   Overall Financial Resource Strain (CARDIA)    Difficulty of Paying Living Expenses: Somewhat hard  Food Insecurity: No Food Insecurity (05/16/2023)   Hunger Vital Sign    Worried About Running Out of Food in the Last Year: Never true    Ran Out of Food in the Last Year: Never true  Recent  Concern: Food Insecurity - Food Insecurity Present (04/07/2023)   Hunger Vital Sign    Worried About Running Out of Food in the Last Year: Sometimes true    Ran Out of Food in the Last Year: Sometimes true  Transportation Needs: No Transportation Needs (05/16/2023)   PRAPARE - Administrator, Civil Service (Medical): No    Lack of Transportation (Non-Medical): No  Physical Activity: Insufficiently Active (05/16/2023)   Exercise Vital Sign    Days of Exercise per Week: 2 days    Minutes of Exercise per Session: 10 min  Stress: No Stress Concern Present (05/16/2023)   [employer] of Occupational Health - Occupational Stress Questionnaire    Feeling of Stress : Not at all  Social Connections: Moderately Isolated (05/16/2023)   Social Connection and Isolation Panel [NHANES]    Frequency of Communication with Friends and Family: More than three times a week    Frequency of Social Gatherings with Friends and Family: Once a week    Attends Religious Services: 1 to 4 times per year    Active Member of [employer] or Organizations: No    Attends Engineer, Structural: Not on file    Marital Status: Divorced   No Known Allergies Family History  Problem Relation Age of Onset   Heart disease Mother    Hypertension Mother    Diabetes Mother    Stroke Father    Hypertension Father    Stroke Brother     Current Outpatient Medications (Endocrine & Metabolic):    dapagliflozin  propanediol (FARXIGA ) 10 MG TABS tablet, Take 1 tablet (10 mg total) by mouth daily.   Dulaglutide  (TRULICITY ) 0.75 MG/0.5ML SOAJ, Inject 0.75 mg into the skin once a week.   glipiZIDE  (GLUCOTROL  XL) 10 MG 24 hr tablet, TAKE 1 TABLET BY MOUTH TWICE A DAY   HUMULIN  N 100 UNIT/ML injection, Inject up to 30 units in morning and 40 units at bedtime   insulin  aspart (NOVOLOG  FLEXPEN) 100 UNIT/ML FlexPen, Inject 25 Units into the skin daily before breakfast.   insulin  lispro (HUMALOG  KWIKPEN) 100 UNIT/ML KwikPen, Humalog  22 units after break fast, 18 units after dinner   metFORMIN  (GLUCOPHAGE ) 1000 MG tablet, TAKE 1 TABLET BY MOUTH TWICE A DAY WITH FOOD   predniSONE  (DELTASONE ) 10 MG tablet, 3 tabs by mouth per day for 3 days,2tabs per day for 3 days,1tab per day for 3 days  Current Outpatient Medications (Cardiovascular):    hydrochlorothiazide  (HYDRODIURIL ) 25 MG tablet, TAKE 1 TABLET BY MOUTH EVERY DAY   lovastatin  (MEVACOR ) 40 MG tablet, Take 1 tablet (40 mg total) by mouth at bedtime.   Current Outpatient Medications (Analgesics):    allopurinol  (ZYLOPRIM ) 100 MG tablet, TAKE 1 TABLET  BY MOUTH TWICE A DAY   indomethacin  (INDOCIN ) 25 MG capsule, TAKE 1 CAPSULE BY MOUTH TWICE A DAY AS NEEDED   meloxicam  (MOBIC ) 15 MG tablet, TAKE 1 TABLET BY MOUTH EVERY DAY AS NEEDED FOR PAIN   traMADol  (ULTRAM ) 50 MG tablet, TAKE 1 TABLET BY MOUTH EVERY 6 HOURS AS NEEDED  Current Outpatient Medications (Hematological):    ferrous sulfate  324 (65 Fe) MG TBEC, Take 1 tablet (325 mg total) by mouth daily.  Current Outpatient Medications (Other):    Continuous Glucose Sensor (DEXCOM G7 SENSOR) MISC, 1 Device by Does not apply route as directed. Change sensor every 10 days   cyclobenzaprine  (FLEXERIL ) 5 MG tablet, TAKE 1 TABLET BY MOUTH THREE TIMES A DAY AS NEEDED  FOR MUSCLE SPASM   cyclobenzaprine  (FLEXERIL ) 5 MG tablet, TAKE 1 TABLET BY MOUTH THREE TIMES A DAY AS NEEDED FOR MUSCLE SPASM   diclofenac  Sodium (VOLTAREN ) 1 % GEL, Apply 2 g topically 4 (four) times daily.   gabapentin  (NEURONTIN ) 100 MG capsule, Take 2 capsules (200 mg total) by mouth at bedtime.   Insulin  Pen Needle (B-D ULTRAFINE III SHORT PEN) 31G X 8 MM MISC, USE AS DIRECTED TWICE A DAY   Insulin  Pen Needle 31G X 6 MM MISC, Use as directed twice daily   Insulin  Syringe-Needle U-100 (RELION INSULIN  SYRINGE) 31G X 15/64 0.5 ML MISC, Use to inject insulin  twice a day.   Ostomy Supplies (SKIN TAC ADHESIVE BARRIER WIPE) MISC, Use to help apply Dexcom sensor every 10 days   OVER THE COUNTER MEDICATION, Take 1 tablet by mouth 2 (two) times daily. Beet Root supplement   pantoprazole  (PROTONIX ) 40 MG tablet, TAKE 1 TABLET BY MOUTH EVERY DAY   solifenacin  (VESICARE ) 5 MG tablet, TAKE 1 TABLET (5 MG TOTAL) BY MOUTH DAILY.   tamsulosin  (FLOMAX ) 0.4 MG CAPS capsule, TAKE 1 CAPSULE BY MOUTH EVERY DAY   Reviewed prior external information including notes and imaging from  primary care provider As well as notes that were available from care everywhere and other healthcare systems.  Past medical history, social, surgical and family history  all reviewed in electronic medical record.  No pertanent information unless stated regarding to the chief complaint.   Review of Systems:  No headache, visual changes, nausea, vomiting, diarrhea, constipation, dizziness, abdominal pain, skin rash, fevers, chills, night sweats, weight loss, swollen lymph nodes, body aches, joint swelling, chest pain, shortness of breath, mood changes. POSITIVE muscle aches  Objective  There were no vitals taken for this visit.   General: No apparent distress alert and oriented x3 mood and affect normal, dressed appropriately.  HEENT: Pupils equal, extraocular movements intact  Respiratory: Patient's speak in full sentences and does not appear short of breath  Cardiovascular: No lower extremity edema, non tender, no erythema      Impression and Recommendations:

## 2023-06-12 ENCOUNTER — Ambulatory Visit: Payer: Commercial Managed Care - HMO | Admitting: Family Medicine

## 2023-06-15 ENCOUNTER — Other Ambulatory Visit: Payer: Self-pay

## 2023-06-15 ENCOUNTER — Encounter: Payer: Self-pay | Admitting: "Endocrinology

## 2023-06-15 ENCOUNTER — Ambulatory Visit (INDEPENDENT_AMBULATORY_CARE_PROVIDER_SITE_OTHER): Payer: Commercial Managed Care - HMO | Admitting: "Endocrinology

## 2023-06-15 VITALS — BP 124/74 | HR 95 | Ht 68.0 in | Wt 242.0 lb

## 2023-06-15 DIAGNOSIS — Z7984 Long term (current) use of oral hypoglycemic drugs: Secondary | ICD-10-CM

## 2023-06-15 DIAGNOSIS — Z794 Long term (current) use of insulin: Secondary | ICD-10-CM

## 2023-06-15 DIAGNOSIS — E1165 Type 2 diabetes mellitus with hyperglycemia: Secondary | ICD-10-CM

## 2023-06-15 DIAGNOSIS — E782 Mixed hyperlipidemia: Secondary | ICD-10-CM

## 2023-06-15 MED ORDER — TRULICITY 0.75 MG/0.5ML ~~LOC~~ SOAJ
0.7500 mg | SUBCUTANEOUS | 0 refills | Status: DC
Start: 2023-06-15 — End: 2023-10-02
  Filled 2023-06-15 – 2023-09-05 (×5): qty 2, 28d supply, fill #0

## 2023-06-15 NOTE — Patient Instructions (Signed)

## 2023-06-15 NOTE — Progress Notes (Signed)
Outpatient Endocrinology Note Steven Montara, MD  06/15/23   [name] Aug 22, 1960 [id number]  Referring Provider: Corwin Levins, MD Primary Care Provider: Corwin Levins, MD Reason for consultation: Subjective   Assessment & Plan  There are no diagnoses linked to this encounter.   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, neuropathy  Lab Results  Component Value Date   GFR 60.10 06/04/2023   Hba1c goal less than 7, current Hba1c is  Lab Results  Component Value Date   HGBA1C 8.6 (H) 04/09/2023   Currently on: Farxiga 10 mg daily, metformin 1 g twice daily, glipizide XL 10 mg bid  25 NPH am and 35 hs (take 15 min before break fast and dinner) Humalog 22 units after break fast, 16 units after dinner (reinforced intake before meals but patient is unable to take it before meals) Start Trulicity 0.75mg /week-sent to [employer] to assess insurance coverage   Goal is to decrease humalog insulin once settled on Trulicity  Check BG before meals Ordered DM education and instructed pt to bring all insulins to help with timings of intake as patient doesn't remember names of insulin  Use skin tac to allow dexcom to stay put  No known contraindications to any of above medications Glucagon discussed and prescribed with refills on 12/04/22  -Last LD and Tg are as follows: Lab Results  Component Value Date   LDLCALC 59 04/09/2023    Lab Results  Component Value Date   TRIG 246.0 (H) 04/09/2023   -on lovastatin 40 mg QD -Follow low fat diet and exercise   -Blood pressure goal <140/90 - Microalbumin/creatinine goal < 30 -Last MA/Cr is as follows: Lab Results  Component Value Date   MICROALBUR <0.7 09/19/2022   -on ACE/ARB lisinopril 5 mg every day  -diet changes including salt restriction -limit eating outside -counseled BP targets per standards of diabetes care -uncontrolled blood pressure can lead to retinopathy, nephropathy and cardiovascular and atherosclerotic  heart disease  Reviewed and counseled on: -A1C target -Blood sugar targets -Complications of uncontrolled diabetes  -Checking blood sugar before meals and bedtime and bring log next visit -All medications with mechanism of action and side effects -Hypoglycemia management: rule of 15's, Glucagon Emergency Kit and medical alert ID -low-carb low-fat plate-method diet -At least 20 minutes of physical activity per day -Annual dilated retinal eye exam and foot exam -compliance and follow up needs -follow up as scheduled or earlier if problem gets worse  Call if blood sugar is less than 70 or consistently above 250    Take a 15 gm snack of carbohydrate at bedtime before you go to sleep if your blood sugar is less than 100.    If you are going to fast after midnight for a test or procedure, ask your physician for instructions on how to reduce/decrease your insulin dose.    Call if blood sugar is less than 70 or consistently above 250  -Treating a low sugar by rule of 15  (15 gms of sugar every 15 min until sugar is more than 70) If you feel your sugar is low, test your sugar to be sure If your sugar is low (less than 70), then take 15 grams of a fast acting Carbohydrate (3-4 glucose tablets or glucose gel or 4 ounces of juice or regular soda) Recheck your sugar 15 min after treating low to make sure it is more than 70 If sugar is still less than 70, treat again with 15 grams of  carbohydrate          Don't drive the hour of hypoglycemia  If unconscious/unable to eat or drink by mouth, use glucagon injection or nasal spray baqsimi and call 911. Can repeat again in 15 min if still unconscious.  Return in about 24 days (around 07/09/2023).   I have reviewed current medications, nurse's notes, allergies, vital signs, past medical and surgical history, family medical history, and social history for this encounter. Counseled patient on symptoms, examination findings, lab findings, imaging results,  treatment decisions and monitoring and prognosis. The patient understood the recommendations and agrees with the treatment plan. All questions regarding treatment plan were fully answered.  Steven Castro, MD  06/15/23    History of Present Illness Steven [name] is a 63 y.o. year old male who presents for follow up of Type II diabetes mellitus.  Steven [name] was first diagnosed in 2010.   Diabetes education +  Home diabetes regimen: Farxiga 10 mg daily, metformin 1 g twice daily, glipizide XL 10 mg bid  Insulin regimen: 25 NPH am and 35 hs (takes after meals, instructed to take 15 min before break fast and dinner) Humalog 20 units after break fast 18 units after dinner   Previous history:     Previously on metformin and started on insulin in 10/21 He has only been on NPH A1c range in the last few years: 8.1-12.1  COMPLICATIONS -  MI/Stroke -  retinopathy +  neuropathy +  nephropathy  BLOOD SUGAR DATA  CGM interpretation: At today's visit, we reviewed her CGM downloads. The full report is scanned in the media. Reviewing the CGM trends, BG are mostly in range, 22% high and 2% low (after dinner).  Physical Exam  BP 124/74   Pulse 95   Ht 5\' 8"  (1.727 m)   Wt 242 lb (109.8 kg)   SpO2 95%   BMI 36.80 kg/m    Constitutional: well developed, well nourished Head: normocephalic, atraumatic Eyes: sclera anicteric, no redness Neck: supple Lungs: normal respiratory effort Neurology: alert and oriented Skin: dry, no appreciable rashes Musculoskeletal: no appreciable defects Psychiatric: normal mood and affect Diabetic Foot Exam - Simple   No data filed      Current Medications Patient's Medications  New Prescriptions   DULAGLUTIDE (TRULICITY) 0.75 MG/0.5ML SOAJ    Inject 0.75 mg into the skin once a week.  Previous Medications   ALLOPURINOL (ZYLOPRIM) 100 MG TABLET    TAKE 1 TABLET BY MOUTH TWICE A DAY   CONTINUOUS GLUCOSE SENSOR (DEXCOM G7 SENSOR) MISC    1 Device  by Does not apply route as directed. Change sensor every 10 days   CYCLOBENZAPRINE (FLEXERIL) 5 MG TABLET    TAKE 1 TABLET BY MOUTH THREE TIMES A DAY AS NEEDED FOR MUSCLE SPASM   CYCLOBENZAPRINE (FLEXERIL) 5 MG TABLET    TAKE 1 TABLET BY MOUTH THREE TIMES A DAY AS NEEDED FOR MUSCLE SPASM   DAPAGLIFLOZIN PROPANEDIOL (FARXIGA) 10 MG TABS TABLET    Take 1 tablet (10 mg total) by mouth daily.   DICLOFENAC SODIUM (VOLTAREN) 1 % GEL    Apply 2 g topically 4 (four) times daily.   DULAGLUTIDE (TRULICITY) 0.75 MG/0.5ML SOAJ    Inject 0.75 mg into the skin once a week.   FERROUS SULFATE 324 (65 FE) MG TBEC    Take 1 tablet (325 mg total) by mouth daily.   GABAPENTIN (NEURONTIN) 100 MG CAPSULE    Take 2 capsules (200 mg total) by  mouth at bedtime.   GLIPIZIDE (GLUCOTROL XL) 10 MG 24 HR TABLET    TAKE 1 TABLET BY MOUTH TWICE A DAY   HUMULIN N 100 UNIT/ML INJECTION    Inject up to 30 units in morning and 40 units at bedtime   HYDROCHLOROTHIAZIDE (HYDRODIURIL) 25 MG TABLET    TAKE 1 TABLET BY MOUTH EVERY DAY   INDOMETHACIN (INDOCIN) 25 MG CAPSULE    TAKE 1 CAPSULE BY MOUTH TWICE A DAY AS NEEDED   INSULIN ASPART (NOVOLOG FLEXPEN) 100 UNIT/ML FLEXPEN    Inject 25 Units into the skin daily before breakfast.   INSULIN LISPRO (HUMALOG KWIKPEN) 100 UNIT/ML KWIKPEN    Humalog 22 units after break fast, 18 units after dinner   INSULIN PEN NEEDLE (B-D ULTRAFINE III SHORT PEN) 31G X 8 MM MISC    USE AS DIRECTED TWICE A DAY   INSULIN PEN NEEDLE 31G X 6 MM MISC    Use as directed twice daily   INSULIN SYRINGE-NEEDLE U-100 (RELION INSULIN SYRINGE) 31G X 15/64" 0.5 ML MISC    Use to inject insulin twice a day.   LOVASTATIN (MEVACOR) 40 MG TABLET    Take 1 tablet (40 mg total) by mouth at bedtime.   MELOXICAM (MOBIC) 15 MG TABLET    TAKE 1 TABLET BY MOUTH EVERY DAY AS NEEDED FOR PAIN   METFORMIN (GLUCOPHAGE) 1000 MG TABLET    TAKE 1 TABLET BY MOUTH TWICE A DAY WITH FOOD   OSTOMY SUPPLIES (SKIN TAC ADHESIVE BARRIER WIPE) MISC     Use to help apply Dexcom sensor every 10 days   OVER THE COUNTER MEDICATION    Take 1 tablet by mouth 2 (two) times daily. Beet Root supplement   PANTOPRAZOLE (PROTONIX) 40 MG TABLET    TAKE 1 TABLET BY MOUTH EVERY DAY   PREDNISONE (DELTASONE) 10 MG TABLET    3 tabs by mouth per day for 3 days,2tabs per day for 3 days,1tab per day for 3 days   SOLIFENACIN (VESICARE) 5 MG TABLET    TAKE 1 TABLET (5 MG TOTAL) BY MOUTH DAILY.   TAMSULOSIN (FLOMAX) 0.4 MG CAPS CAPSULE    TAKE 1 CAPSULE BY MOUTH EVERY DAY   TRAMADOL (ULTRAM) 50 MG TABLET    TAKE 1 TABLET BY MOUTH EVERY 6 HOURS AS NEEDED  Modified Medications   No medications on file  Discontinued Medications   No medications on file    Allergies No Known Allergies  Past Medical History Past Medical History:  Diagnosis Date   Chicken pox    Diabetes mellitus without complication (HCC)    Essential hypertension    GERD (gastroesophageal reflux disease)    Gouty arthritis    Hypertension    Iron deficiency anemia    Vitamin D deficiency     Past Surgical History Past Surgical History:  Procedure Laterality Date   BIOPSY  12/05/2021   Procedure: BIOPSY;  Surgeon: Sherrilyn Rist, MD;  Location: [facility];  Service: Gastroenterology;;   ESOPHAGOGASTRODUODENOSCOPY N/A 12/05/2021   Procedure: ESOPHAGOGASTRODUODENOSCOPY (EGD);  Surgeon: Sherrilyn Rist, MD;  Location: [facility];  Service: Gastroenterology;  Laterality: N/A;   HOT HEMOSTASIS N/A 12/05/2021   Procedure: HOT HEMOSTASIS (ARGON PLASMA COAGULATION/BICAP);  Surgeon: Sherrilyn Rist, MD;  Location: [facility];  Service: Gastroenterology;  Laterality: N/A;   INGUINAL HERNIA REPAIR Right 12/19/2017   Procedure: HERNIA REPAIR INGUINAL RIGHT;  Surgeon: Abigail Miyamoto, MD;  Location: [facility];  Service:  General;  Laterality: Right;   INSERTION OF MESH Right 12/19/2017   Procedure: INSERTION OF MESH;  Surgeon: Abigail Miyamoto, MD;  Location: [facility];  Service: General;   Laterality: Right;   MASS EXCISION Right 12/19/2017   Procedure: EXCISION OF RIGHT SCROTAL MASS;  Surgeon: Abigail Miyamoto, MD;  Location: [facility];  Service: General;  Laterality: Right;   TONSILLECTOMY      Family History family history includes Diabetes in his mother; Heart disease in his mother; Hypertension in his father and mother; Stroke in his brother and father.  Social History Social History   Socioeconomic History   Marital status: Divorced    Spouse name: Not on file   Number of children: 1   Years of education: 12   Highest education level: 12th grade  Occupational History   Occupation: retired  Tobacco Use   Smoking status: Former    Current packs/day: 2.00    Average packs/day: 2.0 packs/day for 25.0 years (50.0 ttl pk-yrs)    Types: Cigarettes   Smokeless tobacco: Former    Types: Engineer, drilling   Vaping status: Never Used  Substance and Sexual Activity   Alcohol use: Not Currently   Drug use: No   Sexual activity: Yes    Birth control/protection: Condom  Other Topics Concern   Not on file  Social History Narrative   Fun: Just working   Social Drivers of Corporate investment banker Strain: Low Risk  (05/16/2023)   Overall Financial Resource Strain (CARDIA)    Difficulty of Paying Living Expenses: Not very hard  Recent Concern: Financial Resource Strain - Medium Risk (04/07/2023)   Overall Financial Resource Strain (CARDIA)    Difficulty of Paying Living Expenses: Somewhat hard  Food Insecurity: No Food Insecurity (05/16/2023)   Hunger Vital Sign    Worried About Running Out of Food in the Last Year: Never true    Ran Out of Food in the Last Year: Never true  Recent Concern: Food Insecurity - Food Insecurity Present (04/07/2023)   Hunger Vital Sign    Worried About Running Out of Food in the Last Year: Sometimes true    Ran Out of Food in the Last Year: Sometimes true  Transportation Needs: No Transportation Needs (05/16/2023)   PRAPARE -  Administrator, Civil Service (Medical): No    Lack of Transportation (Non-Medical): No  Physical Activity: Insufficiently Active (05/16/2023)   Exercise Vital Sign    Days of Exercise per Week: 2 days    Minutes of Exercise per Session: 10 min  Stress: No Stress Concern Present (05/16/2023)   [employer] of Occupational Health - Occupational Stress Questionnaire    Feeling of Stress : Not at all  Social Connections: Moderately Isolated (05/16/2023)   Social Connection and Isolation Panel [NHANES]    Frequency of Communication with Friends and Family: More than three times a week    Frequency of Social Gatherings with Friends and Family: Once a week    Attends Religious Services: 1 to 4 times per year    Active Member of [employer] or Organizations: No    Attends Banker Meetings: Not on file    Marital Status: Divorced  Intimate Partner Violence: Not on file    Lab Results  Component Value Date   HGBA1C 8.6 (H) 04/09/2023   HGBA1C 7.9 (A) 03/07/2023   HGBA1C 8.5 (H) 09/19/2022   Lab Results  Component Value Date   CHOL 140  04/09/2023   Lab Results  Component Value Date   HDL 32.00 (L) 04/09/2023   Lab Results  Component Value Date   LDLCALC 59 04/09/2023   Lab Results  Component Value Date   TRIG 246.0 (H) 04/09/2023   Lab Results  Component Value Date   CHOLHDL 4 04/09/2023   Lab Results  Component Value Date   CREATININE 1.26 06/04/2023   Lab Results  Component Value Date   GFR 60.10 06/04/2023   Lab Results  Component Value Date   MICROALBUR <0.7 09/19/2022      Component Value Date/Time   NA 133 (L) 06/04/2023 1603   K 4.0 06/04/2023 1603   CL 100 06/04/2023 1603   CO2 22 06/04/2023 1603   GLUCOSE 86 06/04/2023 1603   BUN 35 (H) 06/04/2023 1603   CREATININE 1.26 06/04/2023 1603   CALCIUM 9.7 06/04/2023 1603   PROT 7.6 06/04/2023 1603   ALBUMIN 4.6 06/04/2023 1603   AST 17 06/04/2023 1603   ALT 38 06/04/2023 1603    ALKPHOS 63 06/04/2023 1603   BILITOT 0.5 06/04/2023 1603   GFRNONAA 56 (L) 04/25/2022 1904   GFRAA >60 01/30/2018 2037      Latest Ref Rng & Units 06/04/2023    4:03 PM 04/20/2023    8:41 AM 04/09/2023   10:09 AM  BMP  Glucose 70 - 99 mg/dL 86  409  74   BUN 6 - 23 mg/dL 35  30  19   Creatinine 0.40 - 1.50 mg/dL 8.11  9.14  7.82   Sodium 135 - 145 mEq/L 133  134  135   Potassium 3.5 - 5.1 mEq/L 4.0  3.9  4.2   Chloride 96 - 112 mEq/L 100  99  98   CO2 19 - 32 mEq/L 22  28  26    Calcium 8.4 - 10.5 mg/dL 9.7  9.4  9.6        Component Value Date/Time   WBC 8.8 06/04/2023 1603   RBC 5.79 06/04/2023 1603   HGB 14.6 06/04/2023 1603   HCT 46.2 06/04/2023 1603   PLT 332.0 06/04/2023 1603   MCV 79.8 06/04/2023 1603   MCH 25.0 (L) 04/25/2022 1904   MCHC 31.7 06/04/2023 1603   RDW 18.8 (H) 06/04/2023 1603   LYMPHSABS 2.2 06/04/2023 1603   MONOABS 1.0 06/04/2023 1603   EOSABS 0.3 06/04/2023 1603   BASOSABS 0.0 06/04/2023 1603     Parts of this note may have been dictated using voice recognition software. There may be variances in spelling and vocabulary which are unintentional. Not all errors are proofread. Please notify the Thereasa Parkin if any discrepancies are noted or if the meaning of any statement is not clear.

## 2023-06-18 ENCOUNTER — Encounter: Payer: Self-pay | Admitting: "Endocrinology

## 2023-06-18 ENCOUNTER — Other Ambulatory Visit: Payer: Self-pay

## 2023-06-20 ENCOUNTER — Other Ambulatory Visit: Payer: Self-pay

## 2023-06-21 ENCOUNTER — Other Ambulatory Visit: Payer: Self-pay

## 2023-06-22 ENCOUNTER — Other Ambulatory Visit: Payer: Self-pay

## 2023-06-25 ENCOUNTER — Other Ambulatory Visit: Payer: Self-pay

## 2023-06-26 ENCOUNTER — Other Ambulatory Visit: Payer: Self-pay

## 2023-06-28 ENCOUNTER — Other Ambulatory Visit: Payer: Self-pay

## 2023-06-28 ENCOUNTER — Ambulatory Visit: Payer: Managed Care, Other (non HMO) | Admitting: Dietician

## 2023-06-29 ENCOUNTER — Other Ambulatory Visit (HOSPITAL_COMMUNITY): Payer: Self-pay

## 2023-06-29 ENCOUNTER — Telehealth: Payer: Self-pay

## 2023-06-29 NOTE — Telephone Encounter (Signed)
Pharmacy Patient Advocate Encounter   Received notification from RX Request Messages that prior authorization for Humalog 100untis/ml is required/requested.   Insurance verification completed.   The patient is insured through [employer] .   Per test claim: The current 37 day co-pay is, $35.00.  No PA needed at this time. This test claim was processed through [facility]- copay amounts may vary at other pharmacies due to pharmacy/plan contracts, or as the patient moves through the different stages of their insurance plan.

## 2023-06-29 NOTE — Telephone Encounter (Signed)
Called patient pharmacy-medication was picked up last week for $15.00. No PA needed.

## 2023-07-02 ENCOUNTER — Other Ambulatory Visit: Payer: Self-pay

## 2023-07-05 ENCOUNTER — Other Ambulatory Visit: Payer: Self-pay

## 2023-07-05 ENCOUNTER — Other Ambulatory Visit: Payer: Self-pay | Admitting: Internal Medicine

## 2023-07-09 ENCOUNTER — Other Ambulatory Visit: Payer: Self-pay

## 2023-07-13 ENCOUNTER — Other Ambulatory Visit: Payer: Self-pay

## 2023-07-13 ENCOUNTER — Ambulatory Visit: Payer: Commercial Managed Care - HMO | Admitting: "Endocrinology

## 2023-07-16 ENCOUNTER — Other Ambulatory Visit: Payer: Self-pay

## 2023-07-19 ENCOUNTER — Other Ambulatory Visit: Payer: Self-pay

## 2023-08-01 ENCOUNTER — Other Ambulatory Visit: Payer: Self-pay

## 2023-08-01 DIAGNOSIS — E1142 Type 2 diabetes mellitus with diabetic polyneuropathy: Secondary | ICD-10-CM

## 2023-08-01 MED ORDER — GLIPIZIDE ER 10 MG PO TB24: 10.0000 mg | ORAL_TABLET | Freq: Two times a day (BID) | ORAL | 1 refills | Status: AC

## 2023-08-09 ENCOUNTER — Encounter: Payer: Self-pay | Admitting: Dietician

## 2023-08-09 ENCOUNTER — Encounter: Payer: Self-pay | Attending: "Endocrinology | Admitting: Dietician

## 2023-08-09 VITALS — Ht 68.0 in | Wt 243.0 lb

## 2023-08-09 DIAGNOSIS — E1142 Type 2 diabetes mellitus with diabetic polyneuropathy: Secondary | ICD-10-CM | POA: Insufficient documentation

## 2023-08-09 NOTE — Patient Instructions (Addendum)
 Stop your Vitamin D supplement as your vitamin D is high Consider taking a vitamin B12 or vitamin B complex daily  Eye exam  Problem solve for insulin storage.  Insulin should not be kept in the heat or frozen.   Take the Humalog before you eat  Decrease grease (baked rather than fried most often).

## 2023-08-09 NOTE — Progress Notes (Unsigned)
 Diabetes Self-Management Education  Visit Type: First/Initial  Appt. Start Time: 1015 Appt. End Time: 1045  08/10/2023  [name], identified by name and date of birth, is a 63 y.o. male with a diagnosis of Diabetes: Type 2.   ASSESSMENT Patient is here today alone.  He was last seen by myself in 2021 and by another diabetes educator 06/2022.  He has not been able to get the Dexcom G7 due to an insurance issue that he is working on.  Provided him a Dexcom G7 sensor Lot [id number], Expiration 04/27/2024.   He states that he now wears the sensor on his leg and they stick there.   He has a blood glucose meter but is not using this and giving his insulin blindly. He states that sometimes his blood sugar gets low at night. Denies forgetting insulin. Patient states that he needs knee replacement and is just waiting to get it scheduled.  History includes:  Type 2 Diabetes, HTN, history of vitamin D deficiency, iron deficient anemia Blood Glucose Meter:  Relion Premier Medications include Farxiga, Metformin, Glipizide, Humulin N 25 q am and 35 q HS, Humalog 22 before breakfast and 16 units after dinner, iron, Vitamin D, Vitamin B Last A1C 8.6% 04/09/2023 increased from 7.9% 03/07/2023, c-peptide 7.02 on 04/20/2023, eGFR 60 06/04/2023, Vitamin D 120 on 04/09/23, Vitamin B-12 331 09/19/2022 CGM:  Dexcom G7 - currently not wearing as he is problems getting this due to an insurance change  68" 243 lbs 08/09/2023 232 lbs 2021  Social hx:  Intermittently homeless.  No refrigerator and no stove where he lives.  He can reheat his food. He works Holiday representative at times. He has a car  Height 5\' 8"  (1.727 m), weight 243 lb (110.2 kg). Body mass index is 36.95 kg/m.   Diabetes Self-Management Education - 08/09/23 1038       Visit Information   Visit Type First/Initial      Initial Visit   Diabetes Type Type 2    Are you currently following a meal plan? No    Are you taking your  medications as prescribed? Yes      Health Coping   How would you rate your overall health? Good      Psychosocial Assessment   Patient Belief/Attitude about Diabetes Motivated to manage diabetes    What is the hardest part about your diabetes right now, causing you the most concern, or is the most worrisome to you about your diabetes?   Checking blood sugar    Self-care barriers Lack of material resources    Self-management support Doctor's office    Other persons present Patient    Special Needs None    Preferred Learning Style No preference indicated    Learning Readiness Ready    How often do you need to have someone help you when you read instructions, pamphlets, or other written materials from your doctor or pharmacy? 1 - Never    What is the last grade level you completed in school? 12      Pre-Education Assessment   Patient understands the diabetes disease and treatment process. Needs Review    Patient understands incorporating nutritional management into lifestyle. Needs Review    Patient undertands incorporating physical activity into lifestyle. Needs Review    Patient understands using medications safely. Needs Review    Patient understands monitoring blood glucose, interpreting and using results Needs Review    Patient understands prevention, detection, and treatment of acute complications. Needs  Review    Patient understands prevention, detection, and treatment of chronic complications. Needs Review    Patient understands how to develop strategies to address psychosocial issues. Needs Review    Patient understands how to develop strategies to promote health/change behavior. Needs Review      Complications   Last HgB A1C per patient/outside source 8.6 %   11/11.2024 and 7.9% 03/07/2023   How often do you check your blood sugar? 0 times/day (not testing)    Number of hypoglycemic episodes per month 2    Can you tell when your blood sugar is low? Yes    What do you do if  your blood sugar is low? drinks juice    Have you had a dilated eye exam in the past 12 months? No    Have you had a dental exam in the past 12 months? No    Are you checking your feet? No      Dietary Intake   Breakfast leftover fried chicken, rice, cabbage    Snack (morning) none    Lunch pizza or snacks    Snack (afternoon) none    Dinner fried chicken, fried okra    Snack (evening) fruit (fresh)    Beverage(s) water, regular coke 20 oz or more      Activity / Exercise   Activity / Exercise Type Light (walking / raking leaves)    How many days per week do you exercise? 7    How many minutes per day do you exercise? 60    Total minutes per week of exercise 420      Patient Education   Previous Diabetes Education Yes (please comment)   2021, 2023 2024   Healthy Eating Meal options for control of blood glucose level and chronic complications.    Being Active Role of exercise on diabetes management, blood pressure control and cardiac health.    Medications Reviewed patients medication for diabetes, action, purpose, timing of dose and side effects.    Monitoring Taught/evaluated CGM (comment);Identified appropriate SMBG and/or A1C goals.;Daily foot exams    Acute complications Trained/discussed glucagon administration to patient and designated other.;Taught prevention, symptoms, and  treatment of hypoglycemia - the 15 rule.;Discussed and identified patients' prevention, symptoms, and treatment of hyperglycemia.    Diabetes Stress and Support Identified and addressed patients feelings and concerns about diabetes;Worked with patient to identify barriers to care and solutions      Individualized Goals (developed by patient)   Nutrition General guidelines for healthy choices and portions discussed    Physical Activity Exercise 5-7 days per week;30 minutes per day    Medications take my medication as prescribed    Monitoring  Consistenly use CGM    Problem Solving Eating Pattern;Addressing  barriers to behavior change    Reducing Risk examine blood glucose patterns      Post-Education Assessment   Patient understands the diabetes disease and treatment process. Demonstrates understanding / competency    Patient understands incorporating nutritional management into lifestyle. Needs Review    Patient undertands incorporating physical activity into lifestyle. Demonstrates understanding / competency    Patient understands using medications safely. Demonstrates understanding / competency    Patient understands monitoring blood glucose, interpreting and using results Demonstrates understanding / competency    Patient understands prevention, detection, and treatment of acute complications. Demonstrates understanding / competency    Patient understands prevention, detection, and treatment of chronic complications. Demonstrates understanding / competency    Patient understands how to  develop strategies to address psychosocial issues. Comprehends key points    Patient understands how to develop strategies to promote health/change behavior. Comprehends key points      Outcomes   Expected Outcomes Demonstrated interest in learning. Expect positive outcomes    Future DMSE 2 months    Program Status Not Completed             Individualized Plan for Diabetes Self-Management Training:   Learning Objective:  Patient will have a greater understanding of diabetes self-management. Patient education plan is to attend individual and/or group sessions per assessed needs and concerns.   Plan:   Patient Instructions  Stop your Vitamin D supplement as your vitamin D is high Consider taking a vitamin B12 or vitamin B complex daily  Eye exam  Problem solve for insulin storage.  Insulin should not be kept in the heat or frozen.   Take the Humalog before you eat  Decrease grease (baked rather than fried most often).    Expected Outcomes:  Demonstrated interest in learning. Expect positive  outcomes  Education material provided:   If problems or questions, patient to contact team via:  Phone  Future DSME appointment: 2 months

## 2023-08-23 DIAGNOSIS — H5213 Myopia, bilateral: Secondary | ICD-10-CM | POA: Diagnosis not present

## 2023-08-27 ENCOUNTER — Other Ambulatory Visit: Payer: Self-pay

## 2023-08-27 DIAGNOSIS — Z794 Long term (current) use of insulin: Secondary | ICD-10-CM

## 2023-08-27 MED ORDER — INSULIN LISPRO (1 UNIT DIAL) 100 UNIT/ML (KWIKPEN): PEN_INJECTOR | SUBCUTANEOUS | 2 refills | Status: DC

## 2023-08-30 ENCOUNTER — Telehealth: Payer: Self-pay | Admitting: "Endocrinology

## 2023-08-30 DIAGNOSIS — E1165 Type 2 diabetes mellitus with hyperglycemia: Secondary | ICD-10-CM

## 2023-08-30 MED ORDER — INSULIN LISPRO (1 UNIT DIAL) 100 UNIT/ML (KWIKPEN): PEN_INJECTOR | SUBCUTANEOUS | 2 refills | Status: DC

## 2023-08-30 NOTE — Telephone Encounter (Signed)
 Requested Prescriptions   Pending Prescriptions Disp Refills   insulin lispro (HUMALOG KWIKPEN) 100 UNIT/ML KwikPen 15 mL 2    Sig: Humalog 22 units after break fast, 18 units after dinner

## 2023-08-30 NOTE — Telephone Encounter (Signed)
 MEDICATION:  insulin lispro insulin lispro (HUMALOG KWIKPEN) 100 UNIT/ML KwikPen  PHARMACY:    [id number] - [place], [place] - [address] (Ph: [phone])    HAS THE PATIENT CONTACTED THEIR PHARMACY?  Yes  IS THIS A 90 DAY SUPPLY : Yes  IS PATIENT OUT OF MEDICATION: Yes  IF NOT; HOW MUCH IS LEFT:   LAST APPOINTMENT DATE: @ 06/15/2023  NEXT APPOINTMENT DATE:@4 /11/2023  DO WE HAVE YOUR PERMISSION TO LEAVE A DETAILED MESSAGE?:Yes  OTHER COMMENTS: Patient states that [facility] is telling him that they have not received a prescription from this office.   **Let patient know to contact pharmacy at the end of the day to make sure medication is ready. **  ** Please notify patient to allow 48-72 hours to process**  **Encourage patient to contact the pharmacy for refills or they can request refills through [facility]**

## 2023-08-31 ENCOUNTER — Telehealth: Payer: Self-pay

## 2023-08-31 DIAGNOSIS — E1165 Type 2 diabetes mellitus with hyperglycemia: Secondary | ICD-10-CM

## 2023-08-31 MED ORDER — RELION PEN NEEDLES 32G X 4 MM MISC: 1.0000 | Freq: Two times a day (BID) | 1 refills | Status: DC

## 2023-08-31 NOTE — Telephone Encounter (Signed)
 Requested Prescriptions   Pending Prescriptions Disp Refills   Insulin Pen Needle (RELION PEN NEEDLES) 32G X 4 MM MISC 100 each 1    Sig: 1 Needle by Does not apply route 2 (two) times daily.  '

## 2023-09-03 ENCOUNTER — Encounter: Payer: Self-pay | Admitting: "Endocrinology

## 2023-09-03 ENCOUNTER — Other Ambulatory Visit: Payer: Self-pay

## 2023-09-03 ENCOUNTER — Ambulatory Visit (INDEPENDENT_AMBULATORY_CARE_PROVIDER_SITE_OTHER): Admitting: "Endocrinology

## 2023-09-03 VITALS — BP 122/78 | HR 85 | Ht 68.0 in | Wt 243.0 lb

## 2023-09-03 DIAGNOSIS — E1165 Type 2 diabetes mellitus with hyperglycemia: Secondary | ICD-10-CM | POA: Diagnosis not present

## 2023-09-03 DIAGNOSIS — E782 Mixed hyperlipidemia: Secondary | ICD-10-CM

## 2023-09-03 DIAGNOSIS — Z7984 Long term (current) use of oral hypoglycemic drugs: Secondary | ICD-10-CM | POA: Diagnosis not present

## 2023-09-03 DIAGNOSIS — Z794 Long term (current) use of insulin: Secondary | ICD-10-CM

## 2023-09-03 LAB — POCT GLYCOSYLATED HEMOGLOBIN (HGB A1C): Hemoglobin A1C: 7.6 % — AB (ref 4.0–5.6)

## 2023-09-03 NOTE — Patient Instructions (Signed)
 Currently on: Farxiga 10 mg daily, metformin 1 g twice daily, glipizide XL 10 mg bid  25 NPH am and 35 hs (take 15 min before break fast and dinner) Humalog 26 units after break fast, 20 units after dinner  (take 15 min before break fast and dinner) Start Trulicity 0.75mg /week-sent to [employer] to [employer] coverage

## 2023-09-03 NOTE — Progress Notes (Signed)
 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, neuropathy  Lab Results  Component Value Date   GFR 60.10 06/04/2023   Hba1c goal less than 7, current Hba1c is  Lab Results  Component Value Date   HGBA1C 7.6 (A) 09/03/2023   Currently on: Farxiga 10 mg daily, metformin 1 g twice daily, glipizide XL 10 mg bid  25 NPH am and 35 hs (take 15 min before break fast and dinner) Humalog 26 units after break fast, 20 units after dinner (reinforced intake before meals but patient is unable to take it before meals) Start Trulicity 0.75mg /week-sent to [employer] to assess insurance coverage - patient will follow up with pharmacy   Goal is to decrease humalog insulin once settled on Trulicity  Check BG before meals Ordered DM education and instructed pt to bring all insulins to help with timings of intake as patient doesn't remember names of insulin  Use skin tac to allow dexcom to stay put  No known contraindications to any of above medications Glucagon discussed and prescribed with refills on 12/04/22  -Last LD and Tg are as follows: Lab Results  Component Value Date   LDLCALC 59 04/09/2023    Lab Results  Component Value Date   TRIG 246.0 (H) 04/09/2023   -on lovastatin 40 mg every day? -Follow low fat diet and exercise   -Blood pressure goal <140/90 - Microalbumin/creatinine goal < 30 -Last MA/Cr is as follows: Lab  Results  Component Value Date   MICROALBUR <0.7 09/19/2022   -on ACE/ARB lisinopril 5 mg every day ? -diet changes including salt restriction -limit eating outside -counseled BP targets per standards of diabetes care -uncontrolled blood pressure can lead to retinopathy, nephropathy and cardiovascular and atherosclerotic heart disease  Reviewed and counseled on: -A1C target -Blood sugar targets -Complications of uncontrolled diabetes  -Checking blood sugar before meals and bedtime and bring log next visit -All medications with mechanism of action and side effects -Hypoglycemia management: rule of 15's, Glucagon Emergency Kit and medical alert ID -low-carb low-fat plate-method diet -At least 20 minutes of physical activity per day -Annual dilated retinal eye exam and foot exam -compliance and follow up needs -follow up as scheduled or earlier if problem gets worse  Call if blood sugar is less than 70 or consistently above 250    Take a 15 gm snack of carbohydrate at bedtime before you go to sleep if your blood sugar is less than 100.    If you are going to fast after midnight for a test or procedure, ask your physician for instructions on how to reduce/decrease your insulin dose.    Call if blood sugar is less than 70 or consistently above 250  -Treating a low sugar by rule of 15  (15 gms of sugar every 15 min until sugar is more than 70) If you feel your sugar is low, test your sugar to be sure If your sugar is low (less than 70), then  take 15 grams of a fast acting Carbohydrate (3-4 glucose tablets or glucose gel or 4 ounces of juice or regular soda) Recheck your sugar 15 min after treating low to make sure it is more than 70 If sugar is still less than 70, treat again with 15 grams of carbohydrate          Don't drive the hour of hypoglycemia  If unconscious/unable to eat or drink by mouth, use glucagon injection or nasal spray baqsimi and call 911. Can repeat again in 15 min if  still unconscious.  Return in about 29 days (around 10/02/2023).   I have reviewed current medications, nurse's notes, allergies, vital signs, past medical and surgical history, family medical history, and social history for this encounter. Counseled patient on symptoms, examination findings, lab findings, imaging results, treatment decisions and monitoring and prognosis. The patient understood the recommendations and agrees with the treatment plan. All questions regarding treatment plan were fully answered.  Steven Flatonia, MD  09/03/23    History of Present Illness Steven [name] is a 63 y.o. year old male who presents for follow up of Type II diabetes mellitus.  Steven [name] was first diagnosed in 2010.   Diabetes education +  Home diabetes regimen: Farxiga 10 mg daily, metformin 1 g twice daily, glipizide XL 10 mg bid  Insulin regimen: 25 NPH am and 35 hs (takes after meals, instructed to take 15 min before break fast and dinner) Humalog 22 units after break fast and 16 units after dinner   Previous history:     Previously on metformin and started on insulin in 10/21 He has only been on NPH A1c range in the last few years: 8.1-12.1  COMPLICATIONS -  MI/Stroke -  retinopathy +  neuropathy +  nephropathy  BLOOD SUGAR DATA  CGM interpretation: At today's visit, we reviewed her CGM downloads. The full report is scanned in the media. Reviewing the CGM trends, BG are elevated in morning and night.  Physical Exam  BP 122/78   Pulse 85   Ht 5\' 8"  (1.727 m)   Wt 243 lb (110.2 kg)   SpO2 95%   BMI 36.95 kg/m    Constitutional: well developed, well nourished Head: normocephalic, atraumatic Eyes: sclera anicteric, no redness Neck: supple Lungs: normal respiratory effort Neurology: alert and oriented Skin: dry, no appreciable rashes Musculoskeletal: no appreciable defects Psychiatric: normal mood and affect Diabetic Foot Exam - Simple   No data filed      Current  Medications Patient's Medications  New Prescriptions   No medications on file  Previous Medications   ALLOPURINOL (ZYLOPRIM) 100 MG TABLET    TAKE 1 TABLET BY MOUTH TWICE A DAY   CONTINUOUS GLUCOSE SENSOR (DEXCOM G7 SENSOR) MISC    1 Device by Does not apply route as directed. Change sensor every 10 days   CYCLOBENZAPRINE (FLEXERIL) 5 MG TABLET    TAKE 1 TABLET BY MOUTH THREE TIMES A DAY AS NEEDED FOR MUSCLE SPASM   CYCLOBENZAPRINE (FLEXERIL) 5 MG TABLET    TAKE 1 TABLET BY MOUTH THREE TIMES A DAY AS NEEDED FOR MUSCLE SPASM   DAPAGLIFLOZIN PROPANEDIOL (FARXIGA) 10 MG TABS TABLET    Take 1 tablet (10 mg total) by mouth daily.   DICLOFENAC SODIUM (VOLTAREN) 1 % GEL    Apply 2 g topically 4 (four) times daily.   DULAGLUTIDE (TRULICITY) 0.75 MG/0.5ML SOAJ    Inject 0.75 mg into the skin once a week.   DULAGLUTIDE (TRULICITY)  0.75 MG/0.5ML SOAJ    Inject 0.75 mg into the skin once a week.   FERROUS SULFATE 324 (65 FE) MG TBEC    Take 1 tablet (325 mg total) by mouth daily.   GABAPENTIN (NEURONTIN) 100 MG CAPSULE    Take 2 capsules (200 mg total) by mouth at bedtime.   GLIPIZIDE (GLUCOTROL XL) 10 MG 24 HR TABLET    Take 1 tablet (10 mg total) by mouth 2 (two) times daily.   HUMULIN N 100 UNIT/ML INJECTION    Inject up to 30 units in morning and 40 units at bedtime   HYDROCHLOROTHIAZIDE (HYDRODIURIL) 25 MG TABLET    TAKE 1 TABLET BY MOUTH EVERY DAY   INDOMETHACIN (INDOCIN) 25 MG CAPSULE    TAKE 1 CAPSULE BY MOUTH TWICE A DAY AS NEEDED   INSULIN ASPART (NOVOLOG FLEXPEN) 100 UNIT/ML FLEXPEN    Inject 25 Units into the skin daily before breakfast.   INSULIN LISPRO (HUMALOG KWIKPEN) 100 UNIT/ML KWIKPEN    Humalog 22 units after break fast, 18 units after dinner   INSULIN PEN NEEDLE (RELION PEN NEEDLES) 32G X 4 MM MISC    1 Needle by Does not apply route 2 (two) times daily.   INSULIN SYRINGE-NEEDLE U-100 (RELION INSULIN SYRINGE) 31G X 15/64" 0.5 ML MISC    Use to inject insulin twice a day.   LOVASTATIN  (MEVACOR) 40 MG TABLET    TAKE 1 TABLET BY MOUTH EVERYDAY AT BEDTIME   MELOXICAM (MOBIC) 15 MG TABLET    TAKE 1 TABLET BY MOUTH EVERY DAY AS NEEDED FOR PAIN   METFORMIN (GLUCOPHAGE) 1000 MG TABLET    TAKE 1 TABLET BY MOUTH TWICE A DAY WITH FOOD   OSTOMY SUPPLIES (SKIN TAC ADHESIVE BARRIER WIPE) MISC    Use to help apply Dexcom sensor every 10 days   OVER THE COUNTER MEDICATION    Take 1 tablet by mouth 2 (two) times daily. Beet Root supplement   PANTOPRAZOLE (PROTONIX) 40 MG TABLET    TAKE 1 TABLET BY MOUTH EVERY DAY   PREDNISONE (DELTASONE) 10 MG TABLET    3 tabs by mouth per day for 3 days,2tabs per day for 3 days,1tab per day for 3 days   SOLIFENACIN (VESICARE) 5 MG TABLET    TAKE 1 TABLET (5 MG TOTAL) BY MOUTH DAILY.   TAMSULOSIN (FLOMAX) 0.4 MG CAPS CAPSULE    TAKE 1 CAPSULE BY MOUTH EVERY DAY   TRAMADOL (ULTRAM) 50 MG TABLET    TAKE 1 TABLET BY MOUTH EVERY 6 HOURS AS NEEDED  Modified Medications   No medications on file  Discontinued Medications   No medications on file    Allergies No Known Allergies  Past Medical History Past Medical History:  Diagnosis Date   Chicken pox    Diabetes mellitus without complication (HCC)    Essential hypertension    GERD (gastroesophageal reflux disease)    Gouty arthritis    Hypertension    Iron deficiency anemia    Vitamin D deficiency     Past Surgical History Past Surgical History:  Procedure Laterality Date   BIOPSY  12/05/2021   Procedure: BIOPSY;  Surgeon: Sherrilyn Rist, MD;  Location: [facility];  Service: Gastroenterology;;   ESOPHAGOGASTRODUODENOSCOPY N/A 12/05/2021   Procedure: ESOPHAGOGASTRODUODENOSCOPY (EGD);  Surgeon: Sherrilyn Rist, MD;  Location: [facility];  Service: Gastroenterology;  Laterality: N/A;   HOT HEMOSTASIS N/A 12/05/2021   Procedure: HOT HEMOSTASIS (ARGON PLASMA COAGULATION/BICAP);  Surgeon:  Sherrilyn Rist, MD;  Location: [facility];  Service: Gastroenterology;  Laterality: N/A;   INGUINAL  HERNIA REPAIR Right 12/19/2017   Procedure: HERNIA REPAIR INGUINAL RIGHT;  Surgeon: Abigail Miyamoto, MD;  Location: [facility];  Service: General;  Laterality: Right;   INSERTION OF MESH Right 12/19/2017   Procedure: INSERTION OF MESH;  Surgeon: Abigail Miyamoto, MD;  Location: [facility];  Service: General;  Laterality: Right;   MASS EXCISION Right 12/19/2017   Procedure: EXCISION OF RIGHT SCROTAL MASS;  Surgeon: Abigail Miyamoto, MD;  Location: [facility];  Service: General;  Laterality: Right;   TONSILLECTOMY      Family History family history includes Diabetes in his mother; Heart disease in his mother; Hypertension in his father and mother; Stroke in his brother and father.  Social History Social History   Socioeconomic History   Marital status: Divorced    Spouse name: Not on file   Number of children: 1   Years of education: 12   Highest education level: 12th grade  Occupational History   Occupation: retired  Tobacco Use   Smoking status: Former    Current packs/day: 2.00    Average packs/day: 2.0 packs/day for 25.0 years (50.0 ttl pk-yrs)    Types: Cigarettes   Smokeless tobacco: Former    Types: Engineer, drilling   Vaping status: Never Used  Substance and Sexual Activity   Alcohol use: Not Currently   Drug use: No   Sexual activity: Yes    Birth control/protection: Condom  Other Topics Concern   Not on file  Social History Narrative   Fun: Just working   Social Drivers of Corporate investment banker Strain: Low Risk  (05/16/2023)   Overall Financial Resource Strain (CARDIA)    Difficulty of Paying Living Expenses: Not very hard  Recent Concern: Financial Resource Strain - Medium Risk (04/07/2023)   Overall Financial Resource Strain (CARDIA)    Difficulty of Paying Living Expenses: Somewhat hard  Food Insecurity: No Food Insecurity (05/16/2023)   Hunger Vital Sign    Worried About Running Out of Food in the Last Year: Never true    Ran Out of Food in the Last Year:  Never true  Recent Concern: Food Insecurity - Food Insecurity Present (04/07/2023)   Hunger Vital Sign    Worried About Running Out of Food in the Last Year: Sometimes true    Ran Out of Food in the Last Year: Sometimes true  Transportation Needs: No Transportation Needs (05/16/2023)   PRAPARE - Administrator, Civil Service (Medical): No    Lack of Transportation (Non-Medical): No  Physical Activity: Insufficiently Active (05/16/2023)   Exercise Vital Sign    Days of Exercise per Week: 2 days    Minutes of Exercise per Session: 10 min  Stress: No Stress Concern Present (05/16/2023)   [employer] of Occupational Health - Occupational Stress Questionnaire    Feeling of Stress : Not at all  Social Connections: Moderately Isolated (05/16/2023)   Social Connection and Isolation Panel [NHANES]    Frequency of Communication with Friends and Family: More than three times a week    Frequency of Social Gatherings with Friends and Family: Once a week    Attends Religious Services: 1 to 4 times per year    Active Member of [employer] or Organizations: No    Attends Engineer, structural: Not on file    Marital Status: Divorced  Intimate Partner Violence: Not on  file    Lab Results  Component Value Date   HGBA1C 7.6 (A) 09/03/2023   HGBA1C 8.6 (H) 04/09/2023   HGBA1C 7.9 (A) 03/07/2023   Lab Results  Component Value Date   CHOL 140 04/09/2023   Lab Results  Component Value Date   HDL 32.00 (L) 04/09/2023   Lab Results  Component Value Date   LDLCALC 59 04/09/2023   Lab Results  Component Value Date   TRIG 246.0 (H) 04/09/2023   Lab Results  Component Value Date   CHOLHDL 4 04/09/2023   Lab Results  Component Value Date   CREATININE 1.26 06/04/2023   Lab Results  Component Value Date   GFR 60.10 06/04/2023   Lab Results  Component Value Date   MICROALBUR <0.7 09/19/2022      Component Value Date/Time   NA 133 (L) 06/04/2023 1603   K 4.0  06/04/2023 1603   CL 100 06/04/2023 1603   CO2 22 06/04/2023 1603   GLUCOSE 86 06/04/2023 1603   BUN 35 (H) 06/04/2023 1603   CREATININE 1.26 06/04/2023 1603   CALCIUM 9.7 06/04/2023 1603   PROT 7.6 06/04/2023 1603   ALBUMIN 4.6 06/04/2023 1603   AST 17 06/04/2023 1603   ALT 38 06/04/2023 1603   ALKPHOS 63 06/04/2023 1603   BILITOT 0.5 06/04/2023 1603   GFRNONAA 56 (L) 04/25/2022 1904   GFRAA >60 01/30/2018 2037      Latest Ref Rng & Units 06/04/2023    4:03 PM 04/20/2023    8:41 AM 04/09/2023   10:09 AM  BMP  Glucose 70 - 99 mg/dL 86  161  74   BUN 6 - 23 mg/dL 35  30  19   Creatinine 0.40 - 1.50 mg/dL 0.96  0.45  4.09   Sodium 135 - 145 mEq/L 133  134  135   Potassium 3.5 - 5.1 mEq/L 4.0  3.9  4.2   Chloride 96 - 112 mEq/L 100  99  98   CO2 19 - 32 mEq/L 22  28  26    Calcium 8.4 - 10.5 mg/dL 9.7  9.4  9.6        Component Value Date/Time   WBC 8.8 06/04/2023 1603   RBC 5.79 06/04/2023 1603   HGB 14.6 06/04/2023 1603   HCT 46.2 06/04/2023 1603   PLT 332.0 06/04/2023 1603   MCV 79.8 06/04/2023 1603   MCH 25.0 (L) 04/25/2022 1904   MCHC 31.7 06/04/2023 1603   RDW 18.8 (H) 06/04/2023 1603   LYMPHSABS 2.2 06/04/2023 1603   MONOABS 1.0 06/04/2023 1603   EOSABS 0.3 06/04/2023 1603   BASOSABS 0.0 06/04/2023 1603     Parts of this note may have been dictated using voice recognition software. There may be variances in spelling and vocabulary which are unintentional. Not all errors are proofread. Please notify the Thereasa Parkin if any discrepancies are noted or if the meaning of any statement is not clear.

## 2023-09-04 ENCOUNTER — Other Ambulatory Visit: Payer: Self-pay

## 2023-09-04 ENCOUNTER — Other Ambulatory Visit (HOSPITAL_COMMUNITY): Payer: Self-pay

## 2023-09-04 ENCOUNTER — Telehealth: Payer: Self-pay | Admitting: Pharmacy Technician

## 2023-09-04 NOTE — Telephone Encounter (Signed)
 Pharmacy Patient Advocate Encounter   Received notification from CoverMyMeds that prior authorization for Trulicity 0.75MG /0.5ML auto-injectors is required/requested.   Insurance verification completed.   The patient is insured through [facility] .   Per test claim: PA required; PA submitted to above mentioned insurance via Phone Key/confirmation #/EOC Case# [id number] Status is pending

## 2023-09-05 ENCOUNTER — Encounter: Payer: Self-pay | Admitting: "Endocrinology

## 2023-09-05 ENCOUNTER — Other Ambulatory Visit (HOSPITAL_COMMUNITY): Payer: Self-pay

## 2023-09-05 ENCOUNTER — Other Ambulatory Visit: Payer: Self-pay

## 2023-09-05 NOTE — Telephone Encounter (Signed)
 Pharmacy Patient Advocate Encounter  Received notification from [facility] that Prior Authorization for Trulicity 0.75MG /0.5ML auto-injectors  has been APPROVED from 09/04/2023 to 09/03/2024. Ran test claim, Copay is $45.00. This test claim was processed through [facility]- copay amounts may vary at other pharmacies due to pharmacy/plan contracts, or as the patient moves through the different stages of their insurance plan.   PA #/Case ID/Reference #: [id number]

## 2023-09-14 ENCOUNTER — Other Ambulatory Visit: Payer: Self-pay

## 2023-09-17 ENCOUNTER — Other Ambulatory Visit: Payer: Self-pay | Admitting: Internal Medicine

## 2023-09-17 MED ORDER — TRAMADOL HCL 50 MG PO TABS: 50.0000 mg | ORAL_TABLET | Freq: Four times a day (QID) | ORAL | 1 refills | Status: DC | PRN

## 2023-09-17 NOTE — Telephone Encounter (Signed)
 Copied from CRM [id number]. Topic: Clinical - Medication Refill >> Sep 17, 2023  8:28 AM Rosaria Common wrote: Most Recent Primary Care Visit:  Provider: Elvira Hammersmith  Department: [facility] [place]  Visit Type: ACUTE  Date: 06/04/2023  Medication: traMADol  (ULTRAM ) 50 MG tablet  Has the patient contacted their pharmacy? Yes (Agent: If no, request that the patient contact the pharmacy for the refill. If patient does not wish to contact the pharmacy document the reason why and proceed with request.) (Agent: If yes, when and what did the pharmacy advise?)  Is this the correct pharmacy for this prescription? Yes If no, delete pharmacy and type the correct one.  This is the patient's preferred pharmacy:    [facility] [address] [place], [place] - [address] [place] [address] Phone: [phone] Fax: [phone]   Has the prescription been filled recently? Yes  Is the patient out of the medication? Yes  Has the patient been seen for an appointment in the last year OR does the patient have an upcoming appointment? Yes  Can we respond through MyChart? Yes  Agent: Please be advised that Rx refills may take up to 3 business days. We ask that you follow-up with your pharmacy.

## 2023-09-17 NOTE — Telephone Encounter (Signed)
 09/17/2023 10:42am -- Called patient to verify pharmacy --No answer--Left a voicemail.

## 2023-10-02 ENCOUNTER — Encounter: Payer: Self-pay | Admitting: "Endocrinology

## 2023-10-02 ENCOUNTER — Ambulatory Visit (INDEPENDENT_AMBULATORY_CARE_PROVIDER_SITE_OTHER): Admitting: "Endocrinology

## 2023-10-02 VITALS — BP 124/76 | HR 77 | Ht 68.0 in | Wt 249.0 lb

## 2023-10-02 DIAGNOSIS — E1165 Type 2 diabetes mellitus with hyperglycemia: Secondary | ICD-10-CM

## 2023-10-02 DIAGNOSIS — E782 Mixed hyperlipidemia: Secondary | ICD-10-CM

## 2023-10-02 DIAGNOSIS — Z794 Long term (current) use of insulin: Secondary | ICD-10-CM | POA: Diagnosis not present

## 2023-10-02 DIAGNOSIS — Z7984 Long term (current) use of oral hypoglycemic drugs: Secondary | ICD-10-CM

## 2023-10-02 LAB — MICROALBUMIN / CREATININE URINE RATIO
Creatinine, Urine: 64 mg/dL (ref 20–320)
Microalb, Ur: <0.2 mg/dL

## 2023-10-02 MED ORDER — DEXCOM G7 SENSOR MISC: 1.0000 | 3 refills | Status: AC

## 2023-10-02 MED ORDER — TRULICITY 0.75 MG/0.5ML ~~LOC~~ SOAJ
0.7500 mg | SUBCUTANEOUS | 1 refills | Status: DC
Start: 2023-10-02 — End: 2023-11-08

## 2023-10-02 NOTE — Progress Notes (Signed)
 Outpatient Endocrinology Note Steven Newcomer, MD  10/02/23   Steven [name] 65-Mar-1962 [id number]  Referring Provider: [name], MD Primary Care Provider: [name], MD Reason for consultation: Subjective   Assessment & Plan  Diagnoses and all orders for this visit:  Uncontrolled type 2 diabetes mellitus with hyperglycemia, with long-term current use of insulin  (HCC) -     Microalbumin / creatinine urine ratio -     Ambulatory referral to Podiatry  Long-term insulin  use (HCC)  Long term (current) use of oral hypoglycemic drugs  Mixed hypercholesterolemia and hypertriglyceridemia  Other orders -     Dulaglutide  (TRULICITY ) 0.75 MG/0.5ML SOAJ; Inject 0.75 mg into the skin once a week. -     Continuous Glucose Sensor (DEXCOM G7 SENSOR) MISC; 1 Device by Does not apply route as directed. 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, neuropathy  Lab Results  Component Value Date   GFR 60.10 06/04/2023   Hba1c goal less than 7, current Hba1c is  Lab Results  Component Value Date   HGBA1C 7.6 (A) 09/03/2023   Currently on: Farxiga  10 mg daily, metformin  1 g twice daily, glipizide  XL 10 mg bid  32 NPH am and 40 hs (take 15 min before break fast and dinner) Humalog  30 units after break fast, 20 units after dinner (reinforced intake before meals but patient is unable to take it before meals) Start Trulicity  0.75mg /week  Goal is to decrease humalog  insulin  once settled on Trulicity   Check BG before meals Ordered DM education and instructed pt to bring all insulins to help with timings of intake as patient doesn't remember names of insulin   Use skin tac to allow dexcom to stay put  No known contraindications to any of above medications Glucagon  discussed and prescribed with refills on 12/04/22  -Last LD and Tg are as follows: Lab Results  Component Value Date   LDLCALC 59 04/09/2023    Lab Results  Component Value Date   TRIG  246.0 (H) 04/09/2023   -on lovastatin  40 mg every day -Follow low fat diet and exercise   -Blood pressure goal <140/90 - Microalbumin/creatinine goal < 30 -Last MA/Cr is as follows: Lab Results  Component Value Date   MICROALBUR <0.7 09/19/2022   -on ACE/ARB lisinopril  5 mg every day -diet changes including salt restriction -limit eating outside -counseled BP targets per standards of diabetes care -uncontrolled blood pressure can lead to retinopathy, nephropathy and cardiovascular and atherosclerotic heart disease  Reviewed and counseled on: -A1C target -Blood sugar targets -Complications of uncontrolled diabetes  -Checking blood sugar before meals and bedtime and bring log next visit -All medications with mechanism of action and side effects -Hypoglycemia management: rule of 15's, Glucagon  Emergency Kit and medical alert ID -low-carb low-fat plate-method diet -At least 20 minutes of physical activity per day -Annual dilated retinal eye exam and foot exam -compliance and follow up needs -follow up as scheduled or earlier if problem gets worse  Call if blood sugar is less than 70 or consistently above 250    Take a 15 gm snack of carbohydrate at bedtime before you go to sleep if your blood sugar is less than 100.    If you are going to fast after midnight for a test or procedure, ask your physician for instructions on how to reduce/decrease your insulin  dose.    Call if blood sugar is less than 70 or consistently above 250  -Treating  a low sugar by rule of 15  (15 gms of sugar every 15 min until sugar is more than 70) If you feel your sugar is low, test your sugar to be sure If your sugar is low (less than 70), then take 15 grams of a fast acting Carbohydrate (3-4 glucose tablets or glucose gel or 4 ounces of juice or regular soda) Recheck your sugar 15 min after treating low to make sure it is more than 70 If sugar is still less than 70, treat again with 15 grams of  carbohydrate          Don't drive the hour of hypoglycemia  If unconscious/unable to eat or drink by mouth, use glucagon  injection or nasal spray baqsimi and call 911. Can repeat again in 15 min if still unconscious.  Return in about 2 months (around 12/02/2023) for visit, labs today.   I have reviewed current medications, nurse's notes, allergies, vital signs, past medical and surgical history, family medical history, and social history for this encounter. Counseled patient on symptoms, examination findings, lab findings, imaging results, treatment decisions and monitoring and prognosis. The patient 63 understood the recommendations and agrees with the treatment plan. All questions regarding treatment plan were fully answered.  Steven Newcomer, MD  10/02/23    History of Present Illness Steven [name] is a 63 y.o. year old male who presents for follow up of Type II diabetes mellitus.  Steven [name] was first diagnosed in 2010.   Diabetes education +  Home diabetes regimen: Farxiga  10 mg daily, metformin  1 g twice daily, glipizide  XL 10 mg bid  30 NPH am and 40 hs (take 15 min before break fast and dinner) Humalog  28 units after break fast, 22 units after dinner   Previous history:     Previously on metformin  and started on insulin  in 10/21 He has only been on NPH A1c range in the last few years: 8.1-12.1  COMPLICATIONS -  MI/Stroke -  retinopathy +  neuropathy +  nephropathy  BLOOD SUGAR DATA  CGM interpretation: At today's visit, we reviewed her CGM downloads. The full report is scanned in the media. Reviewing the CGM trends, BG are elevated in morning and low around midnight.  Physical Exam  BP 124/76   Pulse 77   Ht 5\' 8"  (1.727 m)   Wt 249 lb (112.9 kg)   SpO2 96%   BMI 37.86 kg/m    Constitutional: well developed, well nourished Head: normocephalic, atraumatic Eyes: sclera anicteric, no redness Neck: supple Lungs: normal respiratory effort Neurology: alert and  oriented Skin: dry, no appreciable rashes Musculoskeletal: no appreciable defects Psychiatric: normal mood and affect Diabetic Foot Exam - Simple   Simple Foot Form Diabetic Foot exam was performed with the following findings: Yes 10/02/2023 10:31 AM  Visual Inspection No deformities, no ulcerations, no other skin breakdown bilaterally: Yes Sensation Testing Intact to touch and monofilament testing bilaterally: Yes Pulse Check Posterior Tibialis and Dorsalis pulse intact bilaterally: Yes Comments      Current Medications Patient's Medications  New Prescriptions   No medications on file  Previous Medications   ALLOPURINOL  (ZYLOPRIM ) 100 MG TABLET    TAKE 1 TABLET BY MOUTH TWICE A DAY   CYCLOBENZAPRINE  (FLEXERIL ) 5 MG TABLET    TAKE 1 TABLET BY MOUTH THREE TIMES A DAY AS NEEDED FOR MUSCLE SPASM   CYCLOBENZAPRINE  (FLEXERIL ) 5 MG TABLET    TAKE 1 TABLET BY MOUTH THREE TIMES A DAY AS NEEDED FOR MUSCLE SPASM  DAPAGLIFLOZIN  PROPANEDIOL (FARXIGA ) 10 MG TABS TABLET    Take 1 tablet (10 mg total) by mouth daily.   DICLOFENAC  SODIUM (VOLTAREN ) 1 % GEL    Apply 2 g topically 4 (four) times daily.   FERROUS SULFATE  324 (65 FE) MG TBEC    Take 1 tablet (325 mg total) by mouth daily.   GABAPENTIN  (NEURONTIN ) 100 MG CAPSULE    Take 2 capsules (200 mg total) by mouth at bedtime.   GLIPIZIDE  (GLUCOTROL  XL) 10 MG 24 HR TABLET    Take 1 tablet (10 mg total) by mouth 2 (two) times daily.   HUMULIN  N 100 UNIT/ML INJECTION    Inject up to 30 units in morning and 40 units at bedtime   HYDROCHLOROTHIAZIDE  (HYDRODIURIL ) 25 MG TABLET    TAKE 1 TABLET BY MOUTH EVERY DAY   INDOMETHACIN  (INDOCIN ) 25 MG CAPSULE    TAKE 1 CAPSULE BY MOUTH TWICE A DAY AS NEEDED   INSULIN  ASPART (NOVOLOG  FLEXPEN) 100 UNIT/ML FLEXPEN    Inject 25 Units into the skin daily before breakfast.   INSULIN  LISPRO (HUMALOG  KWIKPEN) 100 UNIT/ML KWIKPEN    Humalog  22 units after break fast, 18 units after dinner   INSULIN  PEN NEEDLE (RELION  PEN NEEDLES) 32G X 4 MM MISC    1 Needle by Does not apply route 2 (two) times daily.   INSULIN  SYRINGE-NEEDLE U-100 (RELION INSULIN  SYRINGE) 31G X 15/64" 0.5 ML MISC    Use to inject insulin  twice a day.   LOVASTATIN  (MEVACOR ) 40 MG TABLET    TAKE 1 TABLET BY MOUTH EVERYDAY AT BEDTIME   MELOXICAM  (MOBIC ) 15 MG TABLET    TAKE 1 TABLET BY MOUTH EVERY DAY AS NEEDED FOR PAIN   METFORMIN  (GLUCOPHAGE ) 1000 MG TABLET    TAKE 1 TABLET BY MOUTH TWICE A DAY WITH FOOD   OSTOMY SUPPLIES (SKIN TAC ADHESIVE BARRIER WIPE) MISC    Use to help apply Dexcom sensor every 10 days   OVER THE COUNTER MEDICATION    Take 1 tablet by mouth 2 (two) times daily. Beet Root supplement   PANTOPRAZOLE  (PROTONIX ) 40 MG TABLET    TAKE 1 TABLET BY MOUTH EVERY DAY   PREDNISONE  (DELTASONE ) 10 MG TABLET    3 tabs by mouth per day for 3 days,2tabs per day for 3 days,1tab per day for 3 days   SOLIFENACIN  (VESICARE ) 5 MG TABLET    TAKE 1 TABLET (5 MG TOTAL) BY MOUTH DAILY.   TAMSULOSIN  (FLOMAX ) 0.4 MG CAPS CAPSULE    TAKE 1 CAPSULE BY MOUTH EVERY DAY   TRAMADOL  (ULTRAM ) 50 MG TABLET    Take 1 tablet (50 mg total) by mouth every 6 (six) hours as needed.  Modified Medications   Modified Medication Previous Medication   CONTINUOUS GLUCOSE SENSOR (DEXCOM G7 SENSOR) MISC Continuous Glucose Sensor (DEXCOM G7 SENSOR) MISC      1 Device by Does not apply route as directed. Change sensor every 10 days    1 Device by Does not apply route as directed. Change sensor every 10 days   DULAGLUTIDE  (TRULICITY ) 0.75 MG/0.5ML SOAJ Dulaglutide  (TRULICITY ) 0.75 MG/0.5ML SOAJ      Inject 0.75 mg into the skin once a week.    Inject 0.75 mg into the skin once a week.  Discontinued Medications   DULAGLUTIDE  (TRULICITY ) 0.75 MG/0.5ML SOAJ    Inject 0.75 mg into the skin once a week.    Allergies No Known Allergies  Past Medical History Past  Medical History:  Diagnosis Date   Chicken pox    Diabetes mellitus without complication (HCC)    Essential  hypertension    GERD (gastroesophageal reflux disease)    Gouty arthritis    Hypertension    Iron  deficiency anemia    Vitamin D  deficiency     Past Surgical History Past Surgical History:  Procedure Laterality Date   BIOPSY  12/05/2021   Procedure: BIOPSY;  Surgeon: Albertina Hugger, MD;  Location: [facility];  Service: Gastroenterology;;   ESOPHAGOGASTRODUODENOSCOPY N/A 12/05/2021   Procedure: ESOPHAGOGASTRODUODENOSCOPY (EGD);  Surgeon: Albertina Hugger, MD;  Location: [facility];  Service: Gastroenterology;  Laterality: N/A;   HOT HEMOSTASIS N/A 12/05/2021   Procedure: HOT HEMOSTASIS (ARGON PLASMA COAGULATION/BICAP);  Surgeon: Albertina Hugger, MD;  Location: [facility];  Service: Gastroenterology;  Laterality: N/A;   INGUINAL HERNIA REPAIR Right 12/19/2017   Procedure: HERNIA REPAIR INGUINAL RIGHT;  Surgeon: Oza Blumenthal, MD;  Location: [facility];  Service: General;  Laterality: Right;   INSERTION OF MESH Right 12/19/2017   Procedure: INSERTION OF MESH;  Surgeon: Oza Blumenthal, MD;  Location: [facility];  Service: General;  Laterality: Right;   MASS EXCISION Right 12/19/2017   Procedure: EXCISION OF RIGHT SCROTAL MASS;  Surgeon: Oza Blumenthal, MD;  Location: [facility];  Service: General;  Laterality: Right;   TONSILLECTOMY      Family History family history includes Diabetes in his mother; Heart disease in his mother; Hypertension in his father and mother; Stroke in his brother and father.  Social History Social History   Socioeconomic History   Marital status: Divorced    Spouse name: Not on file   Number of children: 1   Years of education: 12   Highest education level: 12th grade  Occupational History   Occupation: retired  Tobacco Use   Smoking status: Former    Current packs/day: 2.00    Average packs/day: 2.0 packs/day for 25.0 years (50.0 ttl pk-yrs)    Types: Cigarettes   Smokeless tobacco: Former    Types: Engineer, drilling   Vaping status: Never  Used  Substance and Sexual Activity   Alcohol use: Not Currently   Drug use: No   Sexual activity: Yes    Birth control/protection: Condom  Other Topics Concern   Not on file  Social History Narrative   Fun: Just working   Social Drivers of Corporate investment banker Strain: Low Risk  (05/16/2023)   Overall Financial Resource Strain (CARDIA)    Difficulty of Paying Living Expenses: Not very hard  Recent Concern: Financial Resource Strain - Medium Risk (04/07/2023)   Overall Financial Resource Strain (CARDIA)    Difficulty of Paying Living Expenses: Somewhat hard  Food Insecurity: No Food Insecurity (05/16/2023)   Hunger Vital Sign    Worried About Running Out of Food in the Last Year: Never true    Ran Out of Food in the Last Year: Never true  Recent Concern: Food Insecurity - Food Insecurity Present (04/07/2023)   Hunger Vital Sign    Worried About Running Out of Food in the Last Year: Sometimes true    Ran Out of Food in the Last Year: Sometimes true  Transportation Needs: No Transportation Needs (05/16/2023)   PRAPARE - Administrator, Civil Service (Medical): No    Lack of Transportation (Non-Medical): No  Physical Activity: Insufficiently Active (05/16/2023)   Exercise Vital Sign    Days of Exercise per  Week: 2 days    Minutes of Exercise per Session: 10 min  Stress: No Stress Concern Present (05/16/2023)   [employer] of Occupational Health - Occupational Stress Questionnaire    Feeling of Stress : Not at all  Social Connections: Moderately Isolated (05/16/2023)   Social Connection and Isolation Panel [NHANES]    Frequency of Communication with Friends and Family: More than three times a week    Frequency of Social Gatherings with Friends and Family: Once a week    Attends Religious Services: 1 to 4 times per year    Active Member of Clubs or Organizations: No    Attends Banker Meetings: Not on file    Marital Status: Divorced   Intimate Partner Violence: Not on file    Lab Results  Component Value Date   HGBA1C 7.6 (A) 09/03/2023   HGBA1C 8.6 (H) 04/09/2023   HGBA1C 7.9 (A) 03/07/2023   Lab Results  Component Value Date   CHOL 140 04/09/2023   Lab Results  Component Value Date   HDL 32.00 (L) 04/09/2023   Lab Results  Component Value Date   LDLCALC 59 04/09/2023   Lab Results  Component Value Date   TRIG 246.0 (H) 04/09/2023   Lab Results  Component Value Date   CHOLHDL 4 04/09/2023   Lab Results  Component Value Date   CREATININE 1.26 06/04/2023   Lab Results  Component Value Date   GFR 60.10 06/04/2023   Lab Results  Component Value Date   MICROALBUR <0.7 09/19/2022      Component Value Date/Time   NA 133 (L) 06/04/2023 1603   K 4.0 06/04/2023 1603   CL 100 06/04/2023 1603   CO2 22 06/04/2023 1603   GLUCOSE 86 06/04/2023 1603   BUN 35 (H) 06/04/2023 1603   CREATININE 1.26 06/04/2023 1603   CALCIUM 9.7 06/04/2023 1603   PROT 7.6 06/04/2023 1603   ALBUMIN 4.6 06/04/2023 1603   AST 17 06/04/2023 1603   ALT 38 06/04/2023 1603   ALKPHOS 63 06/04/2023 1603   BILITOT 0.5 06/04/2023 1603   GFRNONAA 56 (L) 04/25/2022 1904   GFRAA >60 01/30/2018 2037      Latest Ref Rng & Units 06/04/2023    4:03 PM 04/20/2023    8:41 AM 04/09/2023   10:09 AM  BMP  Glucose 70 - 99 mg/dL 86  161  74   BUN 6 - 23 mg/dL 35  30  19   Creatinine 0.40 - 1.50 mg/dL 0.96  0.45  4.09   Sodium 135 - 145 mEq/L 133  134  135   Potassium 3.5 - 5.1 mEq/L 4.0  3.9  4.2   Chloride 96 - 112 mEq/L 100  99  98   CO2 19 - 32 mEq/L 22  28  26    Calcium 8.4 - 10.5 mg/dL 9.7  9.4  9.6        Component Value Date/Time   WBC 8.8 06/04/2023 1603   RBC 5.79 06/04/2023 1603   HGB 14.6 06/04/2023 1603   HCT 46.2 06/04/2023 1603   PLT 332.0 06/04/2023 1603   MCV 79.8 06/04/2023 1603   MCH 25.0 (L) 04/25/2022 1904   MCHC 31.7 06/04/2023 1603   RDW 18.8 (H) 06/04/2023 1603   LYMPHSABS 2.2 06/04/2023 1603    MONOABS 1.0 06/04/2023 1603   EOSABS 0.3 06/04/2023 1603   BASOSABS 0.0 06/04/2023 1603     Parts of this note may have been dictated using  voice recognition software. There may be variances in spelling and vocabulary which are unintentional. Not all errors are proofread. Please notify the [facility] if any discrepancies are noted or if the meaning of any statement is not clear.

## 2023-10-02 NOTE — Patient Instructions (Addendum)
 Recommend the following:  Farxiga  10 mg daily, metformin  1 g twice daily, glipizide  XL 10 mg bid  32 NPH am and 40 hs (take 15 min before break fast and dinner) Humalog  30 units after break fast, 20 units after dinner (take 15 min before break fast and dinner) Start Trulicity  0.75mg /week

## 2023-10-09 ENCOUNTER — Ambulatory Visit: Payer: Managed Care, Other (non HMO) | Admitting: Internal Medicine

## 2023-10-12 ENCOUNTER — Ambulatory Visit (INDEPENDENT_AMBULATORY_CARE_PROVIDER_SITE_OTHER): Admitting: Podiatry

## 2023-10-12 ENCOUNTER — Encounter: Payer: Self-pay | Admitting: Podiatry

## 2023-10-12 DIAGNOSIS — E1140 Type 2 diabetes mellitus with diabetic neuropathy, unspecified: Secondary | ICD-10-CM | POA: Diagnosis not present

## 2023-10-12 DIAGNOSIS — M79675 Pain in left toe(s): Secondary | ICD-10-CM | POA: Diagnosis not present

## 2023-10-12 DIAGNOSIS — B351 Tinea unguium: Secondary | ICD-10-CM

## 2023-10-12 DIAGNOSIS — M79674 Pain in right toe(s): Secondary | ICD-10-CM

## 2023-10-12 DIAGNOSIS — L600 Ingrowing nail: Secondary | ICD-10-CM | POA: Diagnosis not present

## 2023-10-12 NOTE — Patient Instructions (Signed)

## 2023-10-12 NOTE — Progress Notes (Signed)
 Subjective:  Patient ID: Steven [name], male    DOB: May 01, 1961,  MRN: [id number]  Steven [name] presents to clinic today for:  Chief Complaint  Patient presents with   Nail Problem    RM#4 Patient states here for possible nail fungus.  Patient is here presenting to establish care for diabetes and due to concern for nail fungus.  He states that he has difficulty maintaining nails himself.  The nail plates are thickened, elongated, dystrophic with subungual debris.  They are painful with direct pressure and with shoe gear.  The right hallux medial border is especially painful.  He is unsure of last A1c, he believes it is in the sevens range.  PCP is [name], MD.  No Known Allergies  Review of Systems: Negative except as noted in the HPI.  Objective:  There were no vitals filed for this visit.  Steven [name] is a pleasant 63 y.o. male in NAD. AAO x 3.  Vascular Examination: Capillary refill time is less than 3 seconds to toes bilateral. Palpable pedal pulses b/l LE. Digital hair present b/l. No pedal edema b/l. Skin temperature gradient WNL b/l. No varicosities b/l. No cyanosis or clubbing noted b/l.   Dermatological Examination: There is incurvation of the right medial nail border.  There is pain on palpation of the affected nail border.  Minimal edema present along this area.  No erythema.  No drainage.  Remaining nail plates x 9 are thickened, dystrophic with yellow discoloration subungual debris.  There tender on direct dorsal palpation.  Neurological Examination: Light touch sensation intact.  Decreased protective sensation.  Musculoskeletal examination: No gross pedal deformities.  Muscle strength 5/5 for all major muscle groups.     Latest Ref Rng & Units 09/03/2023    9:38 AM 04/09/2023   10:09 AM 03/07/2023    9:16 AM  Hemoglobin A1C  Hemoglobin-A1c 4.0 - 5.6 % 7.6  8.6  7.9      Assessment/Plan: 1. Ingrown toenail of right foot   2. Pain due to  onychomycosis of toenails of both feet   3. Type 2 diabetes, controlled, with neuropathy (HCC)     No orders of the defined types were placed in this encounter.   # Pain due to onychomycosis of toenails - Discussed home care and maintenance of the nail plates - Would benefit from professional nail debridement due to dystrophic nature and due to diabetic neuropathy with paresthesias. -Nail plates x 10 were debrided in thickness and length using sterile nail nippers without incident.  Mechanical bur used to file down the nails  # Diabetes with neuropathy - Patient educated on diabetes. Discussed proper diabetic foot care and discussed risks and complications of disease. Educated patient in depth on reasons to return to the office immediately should he/she discover anything concerning or new on the feet. All questions answered. Discussed proper shoes as well.    # Ingrown toenail right first toe medial border - No signs of acute bacterial infection appreciated today - Medial nail edge is significantly tender. - Recommend PNA today to prevent development of infection or complications associated with this. - Did discuss need for close monitoring and potential for delayed healing due to diabetes.  He does have good circulation reports A1c is below 8.   Procedure: Right hallux partial nail avulsion with chemical matrixectomy Discussed patient's condition today.  After obtaining patient consent, the right hallux was anesthetized with a 50:50 mixture of 1% lidocaine  plain  and 0.5% bupivacaine  plain for a total of 3cc's administered.  Upon confirmation of anesthesia, a freer elevator was utilized to free the right hallux medial nail border from the nail bed.  The nail border was then avulsed proximal to the eponychium and removed in toto.  The area was inspected for any remaining spicules.  A chemical matrixectomy was performed with phenol and neutralized with isopropyl alcohol solution.  Antibiotic  ointment and a DSD were applied, followed by a Coban dressing.  Patient tolerated the anesthetic and procedure well and will f/u in 2-3 weeks for recheck.  Patient given post-procedure instructions for daily 20-minute Epsom salt soaks, antibiotic ointment and daily use of Bandaids until toe starts to dry / form eschar.    Return in about 2 weeks (around 10/26/2023) for Nail Check.   Steven [name], DPM, AACFAS [facility]     [address] [place], [place] [address]                Office [phone]  Fax [phone]

## 2023-10-17 NOTE — Progress Notes (Signed)
 [facility] [address] [place] [address] Phone: [phone] Subjective:   Steven [name], am serving as a scribe for Dr. Ronnell Coins.  I'm seeing this patient by the request  of:  [name], MD  CC: Finger pain  [id number]  Steven [name] is a 63 y.o. male coming in with complaint of B hand. Last seen in October 2024 for knee pain. Patient states that all fingers on both hands feel like they are going to get stuck. At night the pain is the worst.   Also c/o testicle pain for months. Started out as a pulling sensation. Pain is 10/10 today and had to grab his cane to walk into clinic due to pain. L>R.       Past Medical History:  Diagnosis Date   Chicken pox    Diabetes mellitus without complication (HCC)    Essential hypertension    GERD (gastroesophageal reflux disease)    Gouty arthritis    Hypertension    Iron  deficiency anemia    Vitamin D  deficiency    Past Surgical History:  Procedure Laterality Date   BIOPSY  12/05/2021   Procedure: BIOPSY;  Surgeon: Albertina Hugger, MD;  Location: [facility];  Service: Gastroenterology;;   ESOPHAGOGASTRODUODENOSCOPY N/A 12/05/2021   Procedure: ESOPHAGOGASTRODUODENOSCOPY (EGD);  Surgeon: Albertina Hugger, MD;  Location: [facility];  Service: Gastroenterology;  Laterality: N/A;   HOT HEMOSTASIS N/A 12/05/2021   Procedure: HOT HEMOSTASIS (ARGON PLASMA COAGULATION/BICAP);  Surgeon: Albertina Hugger, MD;  Location: [facility];  Service: Gastroenterology;  Laterality: N/A;   INGUINAL HERNIA REPAIR Right 12/19/2017   Procedure: HERNIA REPAIR INGUINAL RIGHT;  Surgeon: Oza Blumenthal, MD;  Location: [facility];  Service: General;  Laterality: Right;   INSERTION OF MESH Right 12/19/2017   Procedure: INSERTION OF MESH;  Surgeon: Oza Blumenthal, MD;  Location: [facility];  Service: General;  Laterality: Right;   MASS EXCISION Right 12/19/2017   Procedure: EXCISION OF RIGHT SCROTAL MASS;   Surgeon: Oza Blumenthal, MD;  Location: [facility];  Service: General;  Laterality: Right;   TONSILLECTOMY     Social History   Socioeconomic History   Marital status: Divorced    Spouse name: Not on file   Number of children: 1   Years of education: 12   Highest education level: 12th grade  Occupational History   Occupation: retired  Tobacco Use   Smoking status: Former    Current packs/day: 2.00    Average packs/day: 2.0 packs/day for 25.0 years (50.0 ttl pk-yrs)    Types: Cigarettes   Smokeless tobacco: Former    Types: Engineer, drilling   Vaping status: Never Used  Substance and Sexual Activity   Alcohol use: Not Currently   Drug use: No   Sexual activity: Yes    Birth control/protection: Condom  Other Topics Concern   Not on file  Social History Narrative   Fun: Just working   Social Drivers of Corporate investment banker Strain: Low Risk  (05/16/2023)   Overall Financial Resource Strain (CARDIA)    Difficulty of Paying Living Expenses: Not very hard  Recent Concern: Financial Resource Strain - Medium Risk (04/07/2023)   Overall Financial Resource Strain (CARDIA)    Difficulty of Paying Living Expenses: Somewhat hard  Food Insecurity: No Food Insecurity (05/16/2023)   Hunger Vital Sign    Worried About Running Out of Food in the Last Year: Never true  Ran Out of Food in the Last Year: Never true  Recent Concern: Food Insecurity - Food Insecurity Present (04/07/2023)   Hunger Vital Sign    Worried About Running Out of Food in the Last Year: Sometimes true    Ran Out of Food in the Last Year: Sometimes true  Transportation Needs: No Transportation Needs (05/16/2023)   PRAPARE - Administrator, Civil Service (Medical): No    Lack of Transportation (Non-Medical): No  Physical Activity: Insufficiently Active (05/16/2023)   Exercise Vital Sign    Days of Exercise per Week: 2 days    Minutes of Exercise per Session: 10 min  Stress: No Stress Concern  Present (05/16/2023)   [employer] of Occupational Health - Occupational Stress Questionnaire    Feeling of Stress : Not at all  Social Connections: Moderately Isolated (05/16/2023)   Social Connection and Isolation Panel [NHANES]    Frequency of Communication with Friends and Family: More than three times a week    Frequency of Social Gatherings with Friends and Family: Once a week    Attends Religious Services: 1 to 4 times per year    Active Member of [employer] or Organizations: No    Attends Engineer, structural: Not on file    Marital Status: Divorced   No Known Allergies Family History  Problem Relation Age of Onset   Heart disease Mother    Hypertension Mother    Diabetes Mother    Stroke Father    Hypertension Father    Stroke Brother     Current Outpatient Medications (Endocrine & Metabolic):    dapagliflozin  propanediol (FARXIGA ) 10 MG TABS tablet, Take 1 tablet (10 mg total) by mouth daily.   Dulaglutide  (TRULICITY ) 0.75 MG/0.5ML SOAJ, Inject 0.75 mg into the skin once a week.   glipiZIDE  (GLUCOTROL  XL) 10 MG 24 hr tablet, Take 1 tablet (10 mg total) by mouth 2 (two) times daily.   HUMULIN  N 100 UNIT/ML injection, Inject up to 30 units in morning and 40 units at bedtime   insulin  aspart (NOVOLOG  FLEXPEN) 100 UNIT/ML FlexPen, Inject 25 Units into the skin daily before breakfast.   insulin  lispro (HUMALOG  KWIKPEN) 100 UNIT/ML KwikPen, Humalog  22 units after break fast, 18 units after dinner (Patient taking differently: Humalog  22 units after break fast, 16 units after dinner)   metFORMIN  (GLUCOPHAGE ) 1000 MG tablet, TAKE 1 TABLET BY MOUTH TWICE A DAY WITH FOOD   predniSONE  (DELTASONE ) 10 MG tablet, 3 tabs by mouth per day for 3 days,2tabs per day for 3 days,1tab per day for 3 days (Patient not taking: Reported on 10/12/2023)  Current Outpatient Medications (Cardiovascular):    hydrochlorothiazide  (HYDRODIURIL ) 25 MG tablet, TAKE 1 TABLET BY MOUTH EVERY DAY    lovastatin  (MEVACOR ) 40 MG tablet, TAKE 1 TABLET BY MOUTH EVERYDAY AT BEDTIME   Current Outpatient Medications (Analgesics):    allopurinol  (ZYLOPRIM ) 100 MG tablet, TAKE 1 TABLET BY MOUTH TWICE A DAY   indomethacin  (INDOCIN ) 25 MG capsule, TAKE 1 CAPSULE BY MOUTH TWICE A DAY AS NEEDED   meloxicam  (MOBIC ) 15 MG tablet, TAKE 1 TABLET BY MOUTH EVERY DAY AS NEEDED FOR PAIN   traMADol  (ULTRAM ) 50 MG tablet, Take 1 tablet (50 mg total) by mouth every 6 (six) hours as needed.  Current Outpatient Medications (Hematological):    ferrous sulfate  324 (65 Fe) MG TBEC, Take 1 tablet (325 mg total) by mouth daily.  Current Outpatient Medications (Other):    Continuous Glucose  Sensor (DEXCOM G7 SENSOR) MISC, 1 Device by Does not apply route as directed. Change sensor every 10 days   cyclobenzaprine  (FLEXERIL ) 5 MG tablet, TAKE 1 TABLET BY MOUTH THREE TIMES A DAY AS NEEDED FOR MUSCLE SPASM   cyclobenzaprine  (FLEXERIL ) 5 MG tablet, TAKE 1 TABLET BY MOUTH THREE TIMES A DAY AS NEEDED FOR MUSCLE SPASM   diclofenac  Sodium (VOLTAREN ) 1 % GEL, Apply 2 g topically 4 (four) times daily.   gabapentin  (NEURONTIN ) 100 MG capsule, Take 2 capsules (200 mg total) by mouth at bedtime.   Insulin  Pen Needle (RELION PEN NEEDLES) 32G X 4 MM MISC, 1 Needle by Does not apply route 2 (two) times daily.   Insulin  Syringe-Needle U-100 (RELION INSULIN  SYRINGE) 31G X 15/64" 0.5 ML MISC, Use to inject insulin  twice a day.   Ostomy Supplies (SKIN TAC ADHESIVE BARRIER WIPE) MISC, Use to help apply Dexcom sensor every 10 days   OVER THE COUNTER MEDICATION, Take 1 tablet by mouth 2 (two) times daily. Beet Root supplement   pantoprazole  (PROTONIX ) 40 MG tablet, TAKE 1 TABLET BY MOUTH EVERY DAY   solifenacin  (VESICARE ) 5 MG tablet, TAKE 1 TABLET (5 MG TOTAL) BY MOUTH DAILY.   tamsulosin  (FLOMAX ) 0.4 MG CAPS capsule, TAKE 1 CAPSULE BY MOUTH EVERY DAY   Reviewed prior external information including notes and imaging from  primary care  provider As well as notes that were available from care everywhere and other healthcare systems.  Past medical history, social, surgical and family history all reviewed in electronic medical record.  No pertanent information unless stated regarding to the chief complaint.   Review of Systems:  No headache, visual changes, nausea, vomiting, diarrhea, constipation, dizziness, skin rash, fevers, chills, night sweats, weight loss, swollen lymph nodes, body aches, joint swelling, chest pain, shortness of breath, mood changes. POSITIVE muscle aches and testicular pain  Objective  There were no vitals taken for this visit.   General: Appears very uncomfortable.  Alert and oriented x 3 HEENT: Pupils equal, extraocular movements intact  Respiratory: Patient's speak in full sentences and does not appear short of breath  Different hand exam.  Patient ambulating with the aid of a cane.  Patient is uncomfortable.  Left testicle exam shows that there may be some noted of the epididymitis and left testicle does appear to be larger than the contralateral side.  Tender to palpation as well.  No inguinal mass of palpated today.    Impression and Recommendations:    The above documentation has been reviewed and is accurate and complete Sekou Zuckerman M Esteban Kobashigawa, DO

## 2023-10-18 ENCOUNTER — Encounter (HOSPITAL_COMMUNITY): Payer: Self-pay

## 2023-10-18 ENCOUNTER — Other Ambulatory Visit: Payer: Self-pay

## 2023-10-18 ENCOUNTER — Emergency Department (HOSPITAL_COMMUNITY)

## 2023-10-18 ENCOUNTER — Emergency Department (HOSPITAL_COMMUNITY)
Admission: EM | Admit: 2023-10-18 | Discharge: 2023-10-19 | Disposition: A | Discharge status: Home / Self Care | Source: Home / Self Care | Attending: Emergency Medicine | Admitting: Emergency Medicine

## 2023-10-18 ENCOUNTER — Emergency Department (HOSPITAL_COMMUNITY)
Admission: EM | Admit: 2023-10-18 | Discharge: 2023-10-18 | Disposition: A | Discharge status: Home / Self Care | Attending: Emergency Medicine | Admitting: Emergency Medicine

## 2023-10-18 ENCOUNTER — Ambulatory Visit: Admitting: Internal Medicine

## 2023-10-18 ENCOUNTER — Ambulatory Visit (INDEPENDENT_AMBULATORY_CARE_PROVIDER_SITE_OTHER): Admitting: Family Medicine

## 2023-10-18 VITALS — BP 104/72 | HR 86 | Ht 68.0 in

## 2023-10-18 DIAGNOSIS — M79642 Pain in left hand: Secondary | ICD-10-CM

## 2023-10-18 DIAGNOSIS — R1032 Left lower quadrant pain: Secondary | ICD-10-CM | POA: Diagnosis not present

## 2023-10-18 DIAGNOSIS — M79641 Pain in right hand: Secondary | ICD-10-CM | POA: Diagnosis not present

## 2023-10-18 DIAGNOSIS — N50812 Left testicular pain: Secondary | ICD-10-CM | POA: Insufficient documentation

## 2023-10-18 DIAGNOSIS — K449 Diaphragmatic hernia without obstruction or gangrene: Secondary | ICD-10-CM | POA: Diagnosis not present

## 2023-10-18 DIAGNOSIS — E119 Type 2 diabetes mellitus without complications: Secondary | ICD-10-CM | POA: Diagnosis not present

## 2023-10-18 DIAGNOSIS — E279 Disorder of adrenal gland, unspecified: Secondary | ICD-10-CM

## 2023-10-18 DIAGNOSIS — Z7984 Long term (current) use of oral hypoglycemic drugs: Secondary | ICD-10-CM | POA: Diagnosis not present

## 2023-10-18 DIAGNOSIS — E278 Other specified disorders of adrenal gland: Secondary | ICD-10-CM | POA: Insufficient documentation

## 2023-10-18 DIAGNOSIS — Z79899 Other long term (current) drug therapy: Secondary | ICD-10-CM | POA: Insufficient documentation

## 2023-10-18 DIAGNOSIS — K4020 Bilateral inguinal hernia, without obstruction or gangrene, not specified as recurrent: Secondary | ICD-10-CM | POA: Diagnosis not present

## 2023-10-18 DIAGNOSIS — N289 Disorder of kidney and ureter, unspecified: Secondary | ICD-10-CM | POA: Insufficient documentation

## 2023-10-18 DIAGNOSIS — Z794 Long term (current) use of insulin: Secondary | ICD-10-CM | POA: Diagnosis not present

## 2023-10-18 DIAGNOSIS — I10 Essential (primary) hypertension: Secondary | ICD-10-CM | POA: Diagnosis not present

## 2023-10-18 DIAGNOSIS — K429 Umbilical hernia without obstruction or gangrene: Secondary | ICD-10-CM | POA: Diagnosis not present

## 2023-10-18 LAB — BASIC METABOLIC PANEL WITH GFR
Anion gap: 12 (ref 5–15)
BUN: 21 mg/dL (ref 8–23)
CO2: 23 mmol/L (ref 22–32)
Calcium: 9.9 mg/dL (ref 8.9–10.3)
Chloride: 100 mmol/L (ref 98–111)
Creatinine, Ser: 1.44 mg/dL — ABNORMAL HIGH (ref 0.61–1.24)
GFR, Estimated: 54 mL/min — ABNORMAL LOW (ref >60)
Glucose, Bld: 142 mg/dL — ABNORMAL HIGH (ref 70–99)
Potassium: 4.6 mmol/L (ref 3.5–5.1)
Sodium: 135 mmol/L (ref 135–145)

## 2023-10-18 LAB — URINALYSIS, ROUTINE W REFLEX MICROSCOPIC
Bacteria, UA: NONE SEEN
Bacteria, UA: NONE SEEN
Bilirubin Urine: NEGATIVE
Bilirubin Urine: NEGATIVE
Glucose, UA: >=500 mg/dL — AB
Glucose, UA: >=500 mg/dL — AB
Hgb urine dipstick: NEGATIVE
Hgb urine dipstick: NEGATIVE
Ketones, ur: NEGATIVE mg/dL
Ketones, ur: NEGATIVE mg/dL
Leukocytes,Ua: NEGATIVE
Leukocytes,Ua: NEGATIVE
Nitrite: NEGATIVE
Nitrite: NEGATIVE
Protein, ur: NEGATIVE mg/dL
Protein, ur: NEGATIVE mg/dL
Specific Gravity, Urine: 1.022 (ref 1.005–1.030)
Specific Gravity, Urine: 1.025 (ref 1.005–1.030)
pH: 5 (ref 5.0–8.0)
pH: 5 (ref 5.0–8.0)

## 2023-10-18 LAB — CBC
HCT: 46.7 % (ref 39.0–52.0)
Hemoglobin: 14.4 g/dL (ref 13.0–17.0)
MCH: 25 pg — ABNORMAL LOW (ref 26.0–34.0)
MCHC: 30.8 g/dL (ref 30.0–36.0)
MCV: 81.2 fL (ref 80.0–100.0)
Platelets: 356 10*3/uL (ref 150–400)
RBC: 5.75 MIL/uL (ref 4.22–5.81)
RDW: 17.7 % — ABNORMAL HIGH (ref 11.5–15.5)
WBC: 12.3 10*3/uL — ABNORMAL HIGH (ref 4.0–10.5)
nRBC: 0 % (ref 0.0–0.2)

## 2023-10-18 LAB — CBG MONITORING, ED: Glucose-Capillary: 90 mg/dL (ref 70–99)

## 2023-10-18 MED ORDER — OXYCODONE-ACETAMINOPHEN 5-325 MG PO TABS
2.0000 | ORAL_TABLET | Freq: Once | ORAL | Status: AC
  Administered 2023-10-18: 2 via ORAL
  Filled 2023-10-18: qty 2

## 2023-10-18 MED ORDER — IBUPROFEN 600 MG PO TABS
600.0000 mg | ORAL_TABLET | Freq: Four times a day (QID) | ORAL | 0 refills | Status: DC | PRN
Start: 2023-10-18 — End: 2023-10-18

## 2023-10-18 MED ORDER — OXYCODONE HCL 5 MG PO TABS: 5.0000 mg | ORAL_TABLET | ORAL | 0 refills | Status: DC | PRN

## 2023-10-18 MED ORDER — OXYCODONE-ACETAMINOPHEN 5-325 MG PO TABS
1.0000 | ORAL_TABLET | Freq: Once | ORAL | Status: AC
  Administered 2023-10-18: 1 via ORAL
  Filled 2023-10-18: qty 1

## 2023-10-18 MED ORDER — IBUPROFEN 400 MG PO TABS
600.0000 mg | ORAL_TABLET | Freq: Once | ORAL | Status: AC
  Administered 2023-10-19: 600 mg via ORAL
  Filled 2023-10-18: qty 1

## 2023-10-18 MED ORDER — OXYCODONE-ACETAMINOPHEN 5-325 MG PO TABS
1.0000 | ORAL_TABLET | Freq: Once | ORAL | Status: AC
  Administered 2023-10-19: 1 via ORAL
  Filled 2023-10-18: qty 1

## 2023-10-18 MED ORDER — KETOROLAC TROMETHAMINE 15 MG/ML IJ SOLN
15.0000 mg | Freq: Once | INTRAMUSCULAR | Status: AC
Start: 2023-10-18 — End: 2023-10-18
  Administered 2023-10-18: 15 mg via INTRAVENOUS
  Filled 2023-10-18: qty 1

## 2023-10-18 MED ORDER — HYDROMORPHONE HCL 1 MG/ML IJ SOLN
1.0000 mg | Freq: Once | INTRAMUSCULAR | Status: AC
  Administered 2023-10-18: 1 mg via INTRAMUSCULAR
  Filled 2023-10-18: qty 1

## 2023-10-18 NOTE — ED Notes (Signed)
 Given sandwiches and juice per request

## 2023-10-18 NOTE — Discharge Instructions (Addendum)
 As we discussed, your ultrasound and urinalysis were normal.  Your CT did not show any acute findings. This is great news.  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  Like you to follow-up with urology for further evaluation.  You can take the 600 mg ibuprofen  I prescribed to every 6 hours for pain.  I would use a athletic support strap as needed to reduce some of the tension on the testicle.  You may return to the emergency department for any worsening symptoms.

## 2023-10-18 NOTE — Assessment & Plan Note (Signed)
 Severe pain in the left testicle with some swelling noted compared to the contralateral side.  Patient does have a past medical history for hernias as well.  Concerned that this could be a potential testicular torsion and that due to this being only 3 hours of constant pain.  Did refer patient to the emergency room if any intervention is necessary.  Differential is still quite broad including the potential infectious etiology or potential kidney stone with referred pain.  Has had a incarcerated hernia previously as well on the right side.  Patient appears to be very uncomfortable.  Came to me for more finger pain but we will further evaluate that at next follow-up.  Discussed transportation but patient wanted to go in private car.  Have discussed with charge nurse at the and they will see him appropriately.

## 2023-10-18 NOTE — ED Notes (Signed)
 US  tech in room starting US  scrotum

## 2023-10-18 NOTE — ED Provider Notes (Addendum)
 [place] EMERGENCY DEPARTMENT AT [facility] Provider Note   CSN: [id number] Arrival date & time: 10/18/23  0940     History Chief Complaint  Patient presents with   Testicle Pain    Steven [name] is a 63 y.o. male patient with history of diabetes, hypertension, GERD who presents the emergency department with left-sided testicular pain.  He states that he has been having some testicle pain intermittently for quite some time and has been evaluated by his primary care doctor for this.  However, over the last 3 days he has noticed increased level of pain in the left testicle which she describes as a pulling sensation.  He states has been doing a lot more walking and was doing some painting around the house as well.  He denies any urinary symptoms.  No dysuria, frequency, urinary urgency, new sexual partners.  There is no discharge.  Denies fever or chills.   Testicle Pain       Home Medications Prior to Admission medications   Medication Sig Start Date End Date Taking? Authorizing Provider  ibuprofen  (ADVIL ) 600 MG tablet Take 1 tablet (600 mg total) by mouth every 6 (six) hours as needed. 10/18/23  Yes Opel Lejeune M, PA-C  allopurinol  (ZYLOPRIM ) 100 MG tablet TAKE 1 TABLET BY MOUTH TWICE A DAY 10/17/22   [name], MD  Continuous Glucose Sensor (DEXCOM G7 SENSOR) MISC 1 Device by Does not apply route as directed. Change sensor every 10 days 10/02/23   Jorge Newcomer, MD  cyclobenzaprine  (FLEXERIL ) 5 MG tablet TAKE 1 TABLET BY MOUTH THREE TIMES A DAY AS NEEDED FOR MUSCLE SPASM 03/30/23   [name], MD  cyclobenzaprine  (FLEXERIL ) 5 MG tablet TAKE 1 TABLET BY MOUTH THREE TIMES A DAY AS NEEDED FOR MUSCLE SPASM 03/30/23   Colene Dauphin, MD  dapagliflozin  propanediol (FARXIGA ) 10 MG TABS tablet Take 1 tablet (10 mg total) by mouth daily. 02/15/23   Motwani, Komal, MD  diclofenac  Sodium (VOLTAREN ) 1 % GEL Apply 2 g topically 4 (four) times daily. 08/31/20   Marshal Skeens, PA-C  Dulaglutide  (TRULICITY ) 0.75 MG/0.5ML SOAJ Inject 0.75 mg into the skin once a week. 10/02/23   Motwani, Komal, MD  ferrous sulfate  324 (65 Fe) MG TBEC Take 1 tablet (325 mg total) by mouth daily. 11/14/22   Isidro Margo, DO  gabapentin  (NEURONTIN ) 100 MG capsule Take 2 capsules (200 mg total) by mouth at bedtime. 12/05/22   Isidro Margo, DO  glipiZIDE  (GLUCOTROL  XL) 10 MG 24 hr tablet Take 1 tablet (10 mg total) by mouth 2 (two) times daily. 08/01/23   Motwani, Komal, MD  HUMULIN  N 100 UNIT/ML injection Inject up to 30 units in morning and 40 units at bedtime 03/30/23   Motwani, Komal, MD  hydrochlorothiazide  (HYDRODIURIL ) 25 MG tablet TAKE 1 TABLET BY MOUTH EVERY DAY 03/30/23   [name], MD  indomethacin  (INDOCIN ) 25 MG capsule TAKE 1 CAPSULE BY MOUTH TWICE A DAY AS NEEDED 05/01/23   [name], MD  insulin  aspart (NOVOLOG  FLEXPEN) 100 UNIT/ML FlexPen Inject 25 Units into the skin daily before breakfast. 04/03/23   Jorge Newcomer, MD  insulin  lispro (HUMALOG  KWIKPEN) 100 UNIT/ML KwikPen Humalog  22 units after break fast, 18 units after dinner Patient taking differently: Humalog  22 units after break fast, 16 units after dinner 08/30/23   Motwani, Komal, MD  Insulin  Pen Needle (RELION PEN NEEDLES) 32G X 4 MM MISC 1 Needle by Does  not apply route 2 (two) times daily. 08/31/23   Motwani, Komal, MD  Insulin  Syringe-Needle U-100 (RELION INSULIN  SYRINGE) 31G X 15/64" 0.5 ML MISC Use to inject insulin  twice a day. 12/28/22   Lajean Pike, MD  lovastatin  (MEVACOR ) 40 MG tablet TAKE 1 TABLET BY MOUTH EVERYDAY AT BEDTIME 07/05/23   [name], MD  meloxicam  (MOBIC ) 15 MG tablet TAKE 1 TABLET BY MOUTH EVERY DAY AS NEEDED FOR PAIN 05/01/23   [name], MD  metFORMIN  (GLUCOPHAGE ) 1000 MG tablet TAKE 1 TABLET BY MOUTH TWICE A DAY WITH FOOD 03/14/23   [name], MD  Ostomy Supplies (SKIN TAC ADHESIVE BARRIER WIPE) MISC Use to help apply Dexcom sensor every 10 days 12/28/22   Lajean Pike, MD  OVER THE  COUNTER MEDICATION Take 1 tablet by mouth 2 (two) times daily. Beet Root supplement    [provider]  pantoprazole  (PROTONIX ) 40 MG tablet TAKE 1 TABLET BY MOUTH EVERY DAY 03/26/23   Albertina Hugger, MD  predniSONE  (DELTASONE ) 10 MG tablet 3 tabs by mouth per day for 3 days,2tabs per day for 3 days,1tab per day for 3 days 05/17/23   [name], MD  solifenacin  (VESICARE ) 5 MG tablet TAKE 1 TABLET (5 MG TOTAL) BY MOUTH DAILY. 03/14/23   [name], MD  tamsulosin  (FLOMAX ) 0.4 MG CAPS capsule TAKE 1 CAPSULE BY MOUTH EVERY DAY 03/21/23   [name], MD  traMADol  (ULTRAM ) 50 MG tablet Take 1 tablet (50 mg total) by mouth every 6 (six) hours as needed. 09/17/23   [name], MD      Allergies    Patient has no known allergies.    Review of Systems   Review of Systems  Genitourinary:  Positive for testicular pain.  All other systems reviewed and are negative.   Physical Exam Updated Vital Signs BP 112/77   Pulse 85   Temp 97.9 F (36.6 C) (Oral)   Resp 17   SpO2 98%  Physical Exam Vitals and nursing note reviewed.  Constitutional:      Appearance: Normal appearance.  HENT:     Head: Normocephalic and atraumatic.  Eyes:     General:        Right eye: No discharge.        Left eye: No discharge.     Conjunctiva/sclera: Conjunctivae normal.  Pulmonary:     Effort: Pulmonary effort is normal.  Genitourinary:    Comments: Testicles are normal.  Normal lie.  Epididymis bilaterally is not inflamed or enlarged.  Did not palpate a mass.  No testicular swelling or tenderness. Skin:    General: Skin is warm and dry.     Findings: No rash.  Neurological:     General: No focal deficit present.     Mental Status: He is alert.  Psychiatric:        Mood and Affect: Mood normal.        Behavior: Behavior normal.      ED Results / Procedures / Treatments   Labs (all labs ordered are listed, but only abnormal results are displayed) Labs Reviewed  URINALYSIS,  ROUTINE W REFLEX MICROSCOPIC - Abnormal; Notable for the following components:      Result Value   Glucose, UA >=500 (*)    All other components within normal limits  CBG MONITORING, ED    EKG None  Radiology US  SCROTUM W/DOPPLER Result Date: 10/18/2023 CLINICAL DATA:  Testicular pain  EXAM: SCROTAL ULTRASOUND DOPPLER ULTRASOUND OF THE TESTICLES TECHNIQUE: Complete ultrasound examination of the testicles, epididymis, and other scrotal structures was performed. Color and spectral Doppler ultrasound were also utilized to evaluate blood flow to the testicles. COMPARISON:  None Available. FINDINGS: Right testicle Measurements: 3 x 2.5 x 2.9 cm. No mass or microlithiasis visualized. Left testicle Measurements: 3.5 x 2.4 x 2.6 cm. No mass or microlithiasis visualized. Right epididymis:  Normal in size and appearance. Left epididymis:  Normal in size and appearance. Hydrocele:  Bilateral small hydroceles Varicocele:  None visualized. Pulsed Doppler interrogation of both testes demonstrates normal low resistance arterial and venous waveforms bilaterally. IMPRESSION: 1. No acute sonographic abnormality within the testicles; more specifically, no testicular mass, or findings of epididymo-orchitis, or changes of testicular torsion, at this time. 2. Small bilateral hydroceles. Electronically Signed   By: Rance Burrows M.D.   On: 10/18/2023 11:33    Procedures Procedures    Medications Ordered in ED Medications  ketorolac  (TORADOL ) 15 MG/ML injection 15 mg (15 mg Intravenous Given 10/18/23 1135)  oxyCODONE -acetaminophen  (PERCOCET/ROXICET) 5-325 MG per tablet 2 tablet (2 tablets Oral Given 10/18/23 1255)  HYDROmorphone (DILAUDID) injection 1 mg (1 mg Intramuscular Given 10/18/23 1348)    ED Course/ Medical Decision Making/ A&P Clinical Course as of 10/18/23 1520  Thu Oct 18, 2023  1402 I was notified by nursing staff prior to the patient discharge that he did not want to be discharged because he is  having pain and is affecting his day-to-day.  He requested other provider.  Patient will be seen and evaluated now by my attending Dr. Zammit. [CF]  1454 Urinalysis, Routine w reflex microscopic -Urine, Random(!) Normal.  [CF]  1454 CBG monitoring, ED Negative.  [CF]    Clinical Course User Index [CF] Darletta Ehrich, PA-C   {   Click here for ABCD2, HEART and other calculators  Medical Decision Making Elmer Merwin is a 63 y.o. male patient who presents to the emergency department today for further evaluation of left-sided testicular pain.  Pain has been there for 3 days.  Ultrasound ordered in triage interpreted by myself.  This was normal.  No evidence of torsion, epididymitis, significant varicocele.  Given this pulling sensation this could be indicative of underlying hernia.  I do not palpate or appreciate any large or incarcerated hernia on my exam.  Pain is slightly better after medication.  I will have him follow-up with urology.  Will have him use a jockstrap for comfort.  Strict return precautions were discussed.  He is safe for discharge.  CT abdomen/pelvis with contrast ordered to look for other etiologies of his left testicular pain including pyelonephritis, kidney stone, mass, prostate etiologies.  Scan is still pending.  If negative patient to be discharged.  Due to shift change rest of his care will be transferred to concoming provider.  Ultimate disposition to be made by oncoming provider.  Amount and/or Complexity of Data Reviewed Labs: ordered. Decision-making details documented in ED Course. Radiology: ordered.  Risk Prescription drug management.    Final Clinical Impression(s) / ED Diagnoses Final diagnoses:  Pain in left testicle    Rx / DC Orders ED Discharge Orders          Ordered    ibuprofen  (ADVIL ) 600 MG tablet  Every 6 hours PRN        10/18/23 1230              Angelyn Kennel [place], [place] 10/18/23 1259  Angelyn Kennel [place], PA-C 10/18/23  1529    Cheyenne Cotta, MD 10/19/23 1043

## 2023-10-18 NOTE — ED Notes (Signed)
 Provider ok to give toradol  IM dt lack of IV access

## 2023-10-18 NOTE — ED Notes (Signed)
 Patient is resting comfortably.

## 2023-10-18 NOTE — ED Triage Notes (Addendum)
 Pt c.o left testicle pain that started this morning. No penile discharge or hematuria. Pt seen at [facility] this morning.

## 2023-10-18 NOTE — Discharge Instructions (Addendum)
 Your evaluation did not show the reason for the pain in your testicle.  For now, I would like you to take your meloxicam  once a day.  If it has not giving you enough relief of pain, add acetaminophen  (Tylenol ).  If the combination of meloxicam  and acetaminophen  is still not giving you sufficient relief, then you may add the tramadol .  If tramadol  is not helping, you may take oxycodone .  Please make an appointment to follow-up with the urologist.  Return to the emergency department if you develop any swelling in the testicle or if you start running a fever.  Your CT scan earlier today showed a nodule in your left adrenal gland.  Your primary care provider needs to arrange for a repeat CT scan in 1 year.

## 2023-10-18 NOTE — ED Provider Triage Note (Signed)
 Emergency Medicine Provider Triage Evaluation Note  Steven [name] , a 63 y.o. male  was evaluated in triage.  Pt complains of testicle pain. Was just seen and discharged from [facility] for same complaint. Denies any change in pain since then.   Review of Systems  Positive:  Negative:   Physical Exam  BP 126/81   Pulse 83   Temp 98.5 F (36.9 C) (Oral)   Resp 16   SpO2 94%  Gen:   Awake, no distress   Resp:  Normal effort  MSK:   Moves extremities without difficulty  Other:    Medical Decision Making  Medically screening exam initiated at 6:24 PM.  Appropriate orders placed.  Steven [name] was informed that the remainder of the evaluation will be completed by another provider, this initial triage assessment does not replace that evaluation, and the importance of remaining in the ED until their evaluation is complete.     Sherra Dk, PA-C 10/18/23 1825

## 2023-10-18 NOTE — ED Provider Notes (Signed)
 Patient signed out to myself by Angelyn Kennel, PA-C pending imaging and likely discharge.  Please refer to his note for full HPI, ROS, PE, and MDM.  CT renal stone study which showed no nephroureterolithiasis or obstructive uropathy.  No acute inflammatory process identified within the abdomen or pelvis.  Indeterminate 1.3 x 1.6 cm left adrenal nodule.  Given patient's imaging showed no acute findings, patient will follow-up with urology for further evaluation.  Patient given return precautions.  I feel patient safe for discharge at this time.   Carie Charity, PA-C 10/18/23 1547    Arvilla Birmingham, MD 10/18/23 [phone]

## 2023-10-18 NOTE — ED Notes (Signed)
 Pt was reluctant to leave at discharge. He is aware he has a prescription to pick up at the pharmacy. He is also aware to follow up with his PCP for further testing and pain control. Also, he is aware if he feels he can not take care of himself he should talk with SW to see what assistance his insurance offers. After conversation and being told he has had a complete work up and the doctor feels he is okay for discharge he will need to get dressed and leave. Bus pass offered. Pt did not accept. He was assisted with dressing and out to lobby.

## 2023-10-18 NOTE — ED Notes (Signed)
 Pt states when he moves, pain 15/10. Pt denies pain during urination, as well as frequency, urgency, and burning

## 2023-10-18 NOTE — ED Provider Notes (Signed)
 [facility] Provider Note   CSN: [id number] Arrival date & time: 10/18/23  1705     History  Chief Complaint  Patient presents with   Testicle Pain    Steven [name] is a 63 y.o. male.  The history is provided by the patient.  Testicle Pain  He has history of hypertension, diabetes, GERD and comes in because of pain in his left testicle.  He has had problems with a pulling feeling in his left testicle for about a year, but he had severe aching in his testicle starting this morning.  He did go to [facility] emergency department where he had CT scan and ultrasound which did not show obvious cause for his pain and was discharged.  He comes in because of ongoing pain.  He did receive analgesics while in the emergency department which did give him some temporary relief.  He also received oxycodone -acetaminophen  at triage here which gave him temporary relief.  He denies fever, chills, sweats.  He denies dysuria or urinary urgency or frequency and denies any urethral discharge.   Home Medications Prior to Admission medications   Medication Sig Start Date End Date Taking? Authorizing Provider  oxyCODONE  (ROXICODONE ) 5 MG immediate release tablet Take 1 tablet (5 mg total) by mouth every 4 (four) hours as needed for severe pain (pain score 7-10). 10/18/23  Yes Alissa April, MD  allopurinol  (ZYLOPRIM ) 100 MG tablet TAKE 1 TABLET BY MOUTH TWICE A DAY 10/17/22   [name], MD  Continuous Glucose Sensor (DEXCOM G7 SENSOR) MISC 1 Device by Does not apply route as directed. Change sensor every 10 days 10/02/23   Motwani, Komal, MD  cyclobenzaprine  (FLEXERIL ) 5 MG tablet TAKE 1 TABLET BY MOUTH THREE TIMES A DAY AS NEEDED FOR MUSCLE SPASM 03/30/23   [name], MD  dapagliflozin  propanediol (FARXIGA ) 10 MG TABS tablet Take 1 tablet (10 mg total) by mouth daily. 02/15/23   Motwani, Komal, MD  diclofenac  Sodium (VOLTAREN ) 1 % GEL Apply 2 g  topically 4 (four) times daily. 08/31/20   Marshal Skeens, PA-C  Dulaglutide  (TRULICITY ) 0.75 MG/0.5ML SOAJ Inject 0.75 mg into the skin once a week. 10/02/23   Motwani, Komal, MD  ferrous sulfate  324 (65 Fe) MG TBEC Take 1 tablet (325 mg total) by mouth daily. 11/14/22   Isidro Margo, DO  gabapentin  (NEURONTIN ) 100 MG capsule Take 2 capsules (200 mg total) by mouth at bedtime. 12/05/22   Isidro Margo, DO  glipiZIDE  (GLUCOTROL  XL) 10 MG 24 hr tablet Take 1 tablet (10 mg total) by mouth 2 (two) times daily. 08/01/23   Motwani, Komal, MD  HUMULIN  N 100 UNIT/ML injection Inject up to 30 units in morning and 40 units at bedtime 03/30/23   Motwani, Komal, MD  hydrochlorothiazide  (HYDRODIURIL ) 25 MG tablet TAKE 1 TABLET BY MOUTH EVERY DAY 03/30/23   [name], MD  insulin  aspart (NOVOLOG  FLEXPEN) 100 UNIT/ML FlexPen Inject 25 Units into the skin daily before breakfast. 04/03/23   Jorge Newcomer, MD  insulin  lispro (HUMALOG  KWIKPEN) 100 UNIT/ML KwikPen Humalog  22 units after break fast, 18 units after dinner Patient taking differently: Humalog  22 units after break fast, 16 units after dinner 08/30/23   Motwani, Komal, MD  Insulin  Pen Needle (RELION PEN NEEDLES) 32G X 4 MM MISC 1 Needle by Does not apply route 2 (two) times daily. 08/31/23   Motwani, Komal, MD  Insulin  Syringe-Needle U-100 (RELION  INSULIN  SYRINGE) 31G X 15/64" 0.5 ML MISC Use to inject insulin  twice a day. 12/28/22   Lajean Pike, MD  lovastatin  (MEVACOR ) 40 MG tablet TAKE 1 TABLET BY MOUTH EVERYDAY AT BEDTIME 07/05/23   [name], MD  meloxicam  (MOBIC ) 15 MG tablet TAKE 1 TABLET BY MOUTH EVERY DAY AS NEEDED FOR PAIN 05/01/23   [name], MD  metFORMIN  (GLUCOPHAGE ) 1000 MG tablet TAKE 1 TABLET BY MOUTH TWICE A DAY WITH FOOD 03/14/23   [name], MD  Ostomy Supplies (SKIN TAC ADHESIVE BARRIER WIPE) MISC Use to help apply Dexcom sensor every 10 days 12/28/22   Lajean Pike, MD  OVER THE COUNTER MEDICATION Take 1 tablet by mouth 2 (two)  times daily. Beet Root supplement    [provider]  pantoprazole  (PROTONIX ) 40 MG tablet TAKE 1 TABLET BY MOUTH EVERY DAY 03/26/23   Albertina Hugger, MD  solifenacin  (VESICARE ) 5 MG tablet TAKE 1 TABLET (5 MG TOTAL) BY MOUTH DAILY. 03/14/23   [name], MD  tamsulosin  (FLOMAX ) 0.4 MG CAPS capsule TAKE 1 CAPSULE BY MOUTH EVERY DAY 03/21/23   [name], MD  traMADol  (ULTRAM ) 50 MG tablet Take 1 tablet (50 mg total) by mouth every 6 (six) hours as needed. 09/17/23   [name], MD      Allergies    Patient has no known allergies.    Review of Systems   Review of Systems  Genitourinary:  Positive for testicular pain.  All other systems reviewed and are negative.   Physical Exam Updated Vital Signs BP 135/88 (BP Location: Left Arm)   Pulse 71   Temp 98.5 F (36.9 C)   Resp 17   SpO2 94%  Physical Exam Vitals and nursing note reviewed.   63 year old male, resting comfortably and in no acute distress. Vital signs are normal. Oxygen saturation is 94%, which is normal. Head is normocephalic and atraumatic. PERRLA, EOMI.  Back is nontender and there is no CVA tenderness. Lungs are clear without rales, wheezes, or rhonchi. Chest is nontender. Heart has regular rate and rhythm without murmur. Abdomen is soft, flat, nontender. Genitalia: Uncircumcised penis, no phimosis or paraphimosis, no urethral discharge.  Testes are descended and normal size and position and nontender.  There is no inguinal tenderness, no hernia.  There is no inguinal adenopathy. Extremities have no cyanosis or edema. Skin is warm and dry without rash. Neurologic: Mental status is normal, moves all extremities equally.  ED Results / Procedures / Treatments   Labs (all labs ordered are listed, but only abnormal results are displayed) Labs Reviewed  CBC - Abnormal; Notable for the following components:      Result Value   WBC 12.3 (*)    MCH 25.0 (*)    RDW 17.7 (*)    All other  components within normal limits  BASIC METABOLIC PANEL WITH GFR - Abnormal; Notable for the following components:   Glucose, Bld 142 (*)    Creatinine, Ser 1.44 (*)    GFR, Estimated 54 (*)    All other components within normal limits  URINALYSIS, ROUTINE W REFLEX MICROSCOPIC - Abnormal; Notable for the following components:   Glucose, UA >=500 (*)    All other components within normal limits   Radiology CT Renal Stone Study Result Date: 10/18/2023 CLINICAL DATA:  Abdominal/flank pain, stone suspected. EXAM: CT ABDOMEN AND PELVIS WITHOUT CONTRAST TECHNIQUE: Multidetector CT imaging of the abdomen and pelvis was  performed following the standard protocol without IV contrast. RADIATION DOSE REDUCTION: This exam was performed according to the departmental dose-optimization program which includes automated exposure control, adjustment of the mA and/or kV according to patient size and/or use of iterative reconstruction technique. COMPARISON:  None Available. FINDINGS: Lower chest: There are patchy atelectatic changes in the visualized lung bases. No overt consolidation. No pleural effusion. The heart is normal in size. No pericardial effusion. Hepatobiliary: The liver is normal in size. Non-cirrhotic configuration. No suspicious mass. These is mild diffuse hepatic steatosis. No intrahepatic or extrahepatic bile duct dilation. No calcified gallstones. Normal gallbladder wall thickness. No pericholecystic inflammatory changes. Pancreas: Unremarkable. No pancreatic ductal dilatation or surrounding inflammatory changes. Spleen: Within normal limits. No focal lesion. Adrenals/Urinary Tract: There is a 1.3 x 1.6 cm indeterminate left adrenal nodule, which cannot be characterized as an adenoma on this exam. Unremarkable right adrenal gland. No suspicious renal mass within the limitations of this unenhanced exam. There several hypoattenuating structures in the left kidney with largest in the upper pole, medially  measuring 2.1 x 2.5 cm, favored to represent a cyst. No nephroureterolithiasis or obstructive uropathy on either side. Unremarkable urinary bladder. Stomach/Bowel: There is a tiny sliding hiatal hernia. No disproportionate dilation of the small or large bowel loops. No evidence of abnormal bowel wall thickening or inflammatory changes. The appendix is unremarkable. There are multiple diverticula mainly in the left hemi colon, without imaging signs of diverticulitis. Vascular/Lymphatic: No ascites or pneumoperitoneum. No abdominal or pelvic lymphadenopathy, by size criteria. No aneurysmal dilation of the major abdominal arteries. There are mild peripheral atherosclerotic vascular calcifications of the aorta and its major branches. Reproductive: Normal size prostate. Symmetric seminal vesicles. Other: There is a tiny fat containing umbilical hernia. There is also a tiny left inguinal hernia containing fat and portion of unobstructed colon. There is also a tiny right inguinal hernia containing fat and portion of tip of appendix. The soft tissues and abdominal wall are otherwise unremarkable. Musculoskeletal: No suspicious osseous lesions. There are mild multilevel degenerative changes in the visualized spine. Note is made of L1-2 partial body fusion. IMPRESSION: 1. No nephroureterolithiasis or obstructive uropathy. 2. No acute inflammatory process identified within the abdomen or pelvis. 3. Indeterminate 1.3 x 1.6 cm left adrenal nodule. Consider 12 month follow-up adrenal mass protocol CT scan. 4. Multiple other nonacute observations, as described above. Aortic Atherosclerosis (ICD10-I70.0). Electronically Signed   By: Beula Brunswick M.D.   On: 10/18/2023 15:42   US  SCROTUM W/DOPPLER Result Date: 10/18/2023 CLINICAL DATA:  Testicular pain EXAM: SCROTAL ULTRASOUND DOPPLER ULTRASOUND OF THE TESTICLES TECHNIQUE: Complete ultrasound examination of the testicles, epididymis, and other scrotal structures was performed.  Color and spectral Doppler ultrasound were also utilized to evaluate blood flow to the testicles. COMPARISON:  None Available. FINDINGS: Right testicle Measurements: 3 x 2.5 x 2.9 cm. No mass or microlithiasis visualized. Left testicle Measurements: 3.5 x 2.4 x 2.6 cm. No mass or microlithiasis visualized. Right epididymis:  Normal in size and appearance. Left epididymis:  Normal in size and appearance. Hydrocele:  Bilateral small hydroceles Varicocele:  None visualized. Pulsed Doppler interrogation of both testes demonstrates normal low resistance arterial and venous waveforms bilaterally. IMPRESSION: 1. No acute sonographic abnormality within the testicles; more specifically, no testicular mass, or findings of epididymo-orchitis, or changes of testicular torsion, at this time. 2. Small bilateral hydroceles. Electronically Signed   By: Rance Burrows M.D.   On: 10/18/2023 11:33    Procedures Procedures  Medications Ordered in ED Medications  ibuprofen  (ADVIL ) tablet 600 mg (has no administration in time range)  oxyCODONE -acetaminophen  (PERCOCET/ROXICET) 5-325 MG per tablet 1 tablet (has no administration in time range)  oxyCODONE -acetaminophen  (PERCOCET/ROXICET) 5-325 MG per tablet 1 tablet (1 tablet Oral Given 10/18/23 1933)    ED Course/ Medical Decision Making/ A&P                                 Medical Decision Making Amount and/or Complexity of Data Reviewed Labs: ordered.   Left testicular pain.  Differential diagnosis includes, but is not limited to, testicular torsion, epididymitis, orchitis, varicocele, hydrocele, inguinal hernia, referred pain from renal colic.  I have reviewed his past records and do note ED visit at [facility] earlier today at which time testicular ultrasound showed no sonographic abnormalities of the testicles and only small bilateral hydroceles which are not likely causes of pain.  Renal stone CT scan showed no nephroureterolithiasis, no  inflammatory process, incidental finding of left adrenal nodule with need for follow-up CT scan in 1 year.  Tiny bilateral inguinal hernias are present and not likely source of pain.  I have reviewed his laboratory tests, and my interpretation is stable renal insufficiency, mild leukocytosis which is nonspecific, normal hemoglobin and platelet count.  At this point, cause for her pain is unclear.  Plan is for pain management and referred to urology for further evaluation.  I have ordered a dose of ibuprofen , acetaminophen , oxycodone .  He has meloxicam  at home which I advised him to start taking.  He also has tramadol  at home.  I have advised him to add acetaminophen  to meloxicam  and reserve tramadol  for pain not relieved by the combination.  I have also given him a's prescription for a small number of oxycodone  tablets to use if tramadol  is not giving him adequately relief.  I am referring him to urology, I have advised him to obtain 1 year follow-up CT scan for his adrenal mass.  Final Clinical Impression(s) / ED Diagnoses Final diagnoses:  Pain in left testicle  Renal insufficiency  Left adrenal mass (HCC)    Rx / DC Orders ED Discharge Orders          Ordered    oxyCODONE  (ROXICODONE ) 5 MG immediate release tablet  Every 4 hours PRN        10/18/23 2358              Alissa April, MD 10/19/23 0005

## 2023-10-18 NOTE — ED Triage Notes (Signed)
 Pt c/o left testicle pain that started this morning. Pt states he felt a pain go down his leg yesterday as well. Denies any pain urinating.

## 2023-10-19 DIAGNOSIS — N50812 Left testicular pain: Secondary | ICD-10-CM | POA: Diagnosis not present

## 2023-10-24 ENCOUNTER — Ambulatory Visit (INDEPENDENT_AMBULATORY_CARE_PROVIDER_SITE_OTHER)

## 2023-10-24 ENCOUNTER — Encounter: Payer: Self-pay | Admitting: Internal Medicine

## 2023-10-24 ENCOUNTER — Ambulatory Visit (INDEPENDENT_AMBULATORY_CARE_PROVIDER_SITE_OTHER): Admitting: Internal Medicine

## 2023-10-24 VITALS — BP 138/84 | HR 95 | Temp 98.2°F | Ht 68.0 in | Wt 234.0 lb

## 2023-10-24 DIAGNOSIS — R0600 Dyspnea, unspecified: Secondary | ICD-10-CM | POA: Diagnosis not present

## 2023-10-24 DIAGNOSIS — M5412 Radiculopathy, cervical region: Secondary | ICD-10-CM | POA: Insufficient documentation

## 2023-10-24 DIAGNOSIS — D509 Iron deficiency anemia, unspecified: Secondary | ICD-10-CM | POA: Diagnosis not present

## 2023-10-24 DIAGNOSIS — R5383 Other fatigue: Secondary | ICD-10-CM | POA: Diagnosis not present

## 2023-10-24 DIAGNOSIS — R109 Unspecified abdominal pain: Secondary | ICD-10-CM

## 2023-10-24 DIAGNOSIS — R252 Cramp and spasm: Secondary | ICD-10-CM | POA: Diagnosis not present

## 2023-10-24 DIAGNOSIS — E538 Deficiency of other specified B group vitamins: Secondary | ICD-10-CM | POA: Diagnosis not present

## 2023-10-24 DIAGNOSIS — R1310 Dysphagia, unspecified: Secondary | ICD-10-CM | POA: Insufficient documentation

## 2023-10-24 DIAGNOSIS — E559 Vitamin D deficiency, unspecified: Secondary | ICD-10-CM | POA: Diagnosis not present

## 2023-10-24 DIAGNOSIS — J9811 Atelectasis: Secondary | ICD-10-CM | POA: Diagnosis not present

## 2023-10-24 DIAGNOSIS — E1142 Type 2 diabetes mellitus with diabetic polyneuropathy: Secondary | ICD-10-CM | POA: Diagnosis not present

## 2023-10-24 DIAGNOSIS — R0781 Pleurodynia: Secondary | ICD-10-CM | POA: Diagnosis not present

## 2023-10-24 DIAGNOSIS — R0989 Other specified symptoms and signs involving the circulatory and respiratory systems: Secondary | ICD-10-CM | POA: Diagnosis not present

## 2023-10-24 LAB — FERRITIN: Ferritin: 147.8 ng/mL (ref 22.0–322.0)

## 2023-10-24 LAB — BASIC METABOLIC PANEL WITH GFR
BUN: 33 mg/dL — ABNORMAL HIGH (ref 6–23)
CO2: 27 meq/L (ref 19–32)
Calcium: 9.8 mg/dL (ref 8.4–10.5)
Chloride: 89 meq/L — ABNORMAL LOW (ref 96–112)
Creatinine, Ser: 1.55 mg/dL — ABNORMAL HIGH (ref 0.40–1.50)
GFR: 46.75 mL/min — ABNORMAL LOW (ref >60.00)
Glucose, Bld: 207 mg/dL — ABNORMAL HIGH (ref 70–99)
Potassium: 4.4 meq/L (ref 3.5–5.1)
Sodium: 126 meq/L — ABNORMAL LOW (ref 135–145)

## 2023-10-24 LAB — CBC WITH DIFFERENTIAL/PLATELET
Basophils Absolute: 0 10*3/uL (ref 0.0–0.1)
Basophils Relative: 0.4 % (ref 0.0–3.0)
Eosinophils Absolute: 0.3 10*3/uL (ref 0.0–0.7)
Eosinophils Relative: 2.6 % (ref 0.0–5.0)
HCT: 43.4 % (ref 39.0–52.0)
Hemoglobin: 13.9 g/dL (ref 13.0–17.0)
Lymphocytes Relative: 22.6 % (ref 12.0–46.0)
Lymphs Abs: 2.3 10*3/uL (ref 0.7–4.0)
MCHC: 32.1 g/dL (ref 30.0–36.0)
MCV: 78.4 fl (ref 78.0–100.0)
Monocytes Absolute: 1 10*3/uL (ref 0.1–1.0)
Monocytes Relative: 10.3 % (ref 3.0–12.0)
Neutro Abs: 6.5 10*3/uL (ref 1.4–7.7)
Neutrophils Relative %: 64.1 % (ref 43.0–77.0)
Platelets: 416 10*3/uL — ABNORMAL HIGH (ref 150.0–400.0)
RBC: 5.54 Mil/uL (ref 4.22–5.81)
RDW: 17.9 % — ABNORMAL HIGH (ref 11.5–15.5)
WBC: 10.1 10*3/uL (ref 4.0–10.5)

## 2023-10-24 LAB — HEPATIC FUNCTION PANEL
ALT: 28 U/L (ref 0–53)
AST: 14 U/L (ref 0–37)
Albumin: 4.5 g/dL (ref 3.5–5.2)
Alkaline Phosphatase: 90 U/L (ref 39–117)
Bilirubin, Direct: 0.2 mg/dL (ref 0.0–0.3)
Total Bilirubin: 0.7 mg/dL (ref 0.2–1.2)
Total Protein: 8.3 g/dL (ref 6.0–8.3)

## 2023-10-24 LAB — CK: Total CK: 255 U/L — ABNORMAL HIGH (ref 7–232)

## 2023-10-24 LAB — MAGNESIUM: Magnesium: 2.4 mg/dL (ref 1.5–2.5)

## 2023-10-24 LAB — TSH: TSH: 2.96 u[IU]/mL (ref 0.35–5.50)

## 2023-10-24 LAB — VITAMIN B12: Vitamin B-12: >1500 pg/mL — ABNORMAL HIGH (ref 211–911)

## 2023-10-24 LAB — HEMOGLOBIN A1C: Hgb A1c MFr Bld: 8.8 % — ABNORMAL HIGH (ref 4.6–6.5)

## 2023-10-24 LAB — BRAIN NATRIURETIC PEPTIDE: Pro B Natriuretic peptide (BNP): 10 pg/mL (ref 0.0–100.0)

## 2023-10-24 LAB — IBC PANEL
Iron: 24 ug/dL — ABNORMAL LOW (ref 42–165)
Saturation Ratios: 6.4 % — ABNORMAL LOW (ref 20.0–50.0)
TIBC: 372.4 ug/dL (ref 250.0–450.0)
Transferrin: 266 mg/dL (ref 212.0–360.0)

## 2023-10-24 LAB — SEDIMENTATION RATE: Sed Rate: 98 mm/h — ABNORMAL HIGH (ref 0–20)

## 2023-10-24 MED ORDER — METHOCARBAMOL 500 MG PO TABS: 500.0000 mg | ORAL_TABLET | Freq: Four times a day (QID) | ORAL | 1 refills | Status: DC

## 2023-10-24 MED ORDER — OXYCODONE-ACETAMINOPHEN 7.5-325 MG PO TABS: 1.0000 | ORAL_TABLET | Freq: Four times a day (QID) | ORAL | 0 refills | Status: DC | PRN

## 2023-10-24 NOTE — Assessment & Plan Note (Signed)
 Lab Results  Component Value Date   HGBA1C 8.8 (H) 10/24/2023   Uncontrolled, encourage to be more compliant with med, pt to continue current medical treatment farxiga  10 mg every day, trulicity  0.75 mg weekly, glipizide  xl 10 mg bid as delcine other change for now

## 2023-10-24 NOTE — Assessment & Plan Note (Signed)
 Lab Results  Component Value Date   VITAMINB12 >1500 (H) 10/24/2023   Stable, cont oral replacement - b12 1000 mcg qd

## 2023-10-24 NOTE — Progress Notes (Signed)
 Patient ID: Steven [name], male   DOB: [birth year], 63 y.o.   MRN: [id number]        Chief Complaint: follow up with dysphagia and possible reduced po intake, general weak, left flank spasm and pain, and left lumbar pain with radiation to the left groin and hip now improved - renal stone ruled out at ED x 2 visit.         HPI:  Steven [name] is a 63 y.o. male here with c/o recent left sided pain taken to be testicle and abdominal, but also is lumbar and left hip area pain, seen and Tx at ED x 2 without specific findings, CT neg for acute.  Pain not better with oxycodone  5 mg or flexeril , but overall improved now mild, even though med have run out.  Worst pain now is left flank severe spasm pain for unclear reason, worse with inspiration or any movement of the trunk, walking with cane today.  Also incidentally with last visit to ED with mild increased creatinine, lower recent po intake and dysphagia to solids, last EGD July 2023.  Pt denies chest painm wheezing, orthopnea, PND, increased LE swelling, palpitations, dizziness or syncope though has chronic sob.   Pt denies polydipsia, polyuria, or new focal neuro s/s.   Has known hx of C spine neuritic pain after CT c spine 2020, but no prior MRI lumbar.         Wt Readings from Last 3 Encounters:  10/24/23 234 lb (106.1 kg)  10/02/23 249 lb (112.9 kg)  09/03/23 243 lb (110.2 kg)   BP Readings from Last 3 Encounters:  10/24/23 138/84  10/19/23 (!) 111/53  10/18/23 112/87         Past Medical History:  Diagnosis Date   Chicken pox    Diabetes mellitus without complication (HCC)    Essential hypertension    GERD (gastroesophageal reflux disease)    Gouty arthritis    Hypertension    Iron  deficiency anemia    Vitamin D  deficiency    Past Surgical History:  Procedure Laterality Date   BIOPSY  12/05/2021   Procedure: BIOPSY;  Surgeon: Albertina Hugger, MD;  Location: [facility];  Service: Gastroenterology;;   ESOPHAGOGASTRODUODENOSCOPY N/A  12/05/2021   Procedure: ESOPHAGOGASTRODUODENOSCOPY (EGD);  Surgeon: Albertina Hugger, MD;  Location: [facility];  Service: Gastroenterology;  Laterality: N/A;   HOT HEMOSTASIS N/A 12/05/2021   Procedure: HOT HEMOSTASIS (ARGON PLASMA COAGULATION/BICAP);  Surgeon: Albertina Hugger, MD;  Location: [facility];  Service: Gastroenterology;  Laterality: N/A;   INGUINAL HERNIA REPAIR Right 12/19/2017   Procedure: HERNIA REPAIR INGUINAL RIGHT;  Surgeon: Oza Blumenthal, MD;  Location: [facility];  Service: General;  Laterality: Right;   INSERTION OF MESH Right 12/19/2017   Procedure: INSERTION OF MESH;  Surgeon: Oza Blumenthal, MD;  Location: [facility];  Service: General;  Laterality: Right;   MASS EXCISION Right 12/19/2017   Procedure: EXCISION OF RIGHT SCROTAL MASS;  Surgeon: Oza Blumenthal, MD;  Location: [facility];  Service: General;  Laterality: Right;   TONSILLECTOMY      reports that he has quit smoking. His smoking use included cigarettes. He has a 50 pack-year smoking history. He has quit using smokeless tobacco.  His smokeless tobacco use included chew. He reports that he does not currently use alcohol. He reports that he does not use drugs. family history includes Diabetes in his mother; Heart disease in his mother; Hypertension in his father and mother; Stroke  in his brother and father. No Known Allergies Current Outpatient Medications on File Prior to Visit  Medication Sig Dispense Refill   allopurinol  (ZYLOPRIM ) 100 MG tablet TAKE 1 TABLET BY MOUTH TWICE A DAY 180 tablet 3   Continuous Glucose Sensor (DEXCOM G7 SENSOR) MISC 1 Device by Does not apply route as directed. Change sensor every 10 days 9 each 3   dapagliflozin  propanediol (FARXIGA ) 10 MG TABS tablet Take 1 tablet (10 mg total) by mouth daily. 90 tablet 3   diclofenac  Sodium (VOLTAREN ) 1 % GEL Apply 2 g topically 4 (four) times daily. 50 g 0   Dulaglutide  (TRULICITY ) 0.75 MG/0.5ML SOAJ Inject 0.75 mg into the skin once a week. 2  mL 1   ferrous sulfate  324 (65 Fe) MG TBEC Take 1 tablet (325 mg total) by mouth daily. 30 tablet 0   gabapentin  (NEURONTIN ) 100 MG capsule Take 2 capsules (200 mg total) by mouth at bedtime. 180 capsule 3   glipiZIDE  (GLUCOTROL  XL) 10 MG 24 hr tablet Take 1 tablet (10 mg total) by mouth 2 (two) times daily. 90 tablet 1   HUMULIN  N 100 UNIT/ML injection Inject up to 30 units in morning and 40 units at bedtime 70 mL 3   hydrochlorothiazide  (HYDRODIURIL ) 25 MG tablet TAKE 1 TABLET BY MOUTH EVERY DAY 30 tablet 11   ibuprofen  (ADVIL ) 600 MG tablet Take 600 mg by mouth every 6 (six) hours as needed.     insulin  aspart (NOVOLOG  FLEXPEN) 100 UNIT/ML FlexPen Inject 25 Units into the skin daily before breakfast. 45 mL 0   insulin  lispro (HUMALOG  KWIKPEN) 100 UNIT/ML KwikPen Humalog  22 units after break fast, 18 units after dinner (Patient taking differently: Humalog  22 units after break fast, 16 units after dinner) 15 mL 2   Insulin  Pen Needle (RELION PEN NEEDLES) 32G X 4 MM MISC 1 Needle by Does not apply route 2 (two) times daily. 100 each 1   Insulin  Syringe-Needle U-100 (RELION INSULIN  SYRINGE) 31G X 15/64" 0.5 ML MISC Use to inject insulin  twice a day. 200 each 3   lisinopril  (ZESTRIL ) 5 MG tablet Take 5 mg by mouth daily.     lovastatin  (MEVACOR ) 40 MG tablet TAKE 1 TABLET BY MOUTH EVERYDAY AT BEDTIME 30 tablet 11   meloxicam  (MOBIC ) 15 MG tablet TAKE 1 TABLET BY MOUTH EVERY DAY AS NEEDED FOR PAIN 30 tablet 5   metFORMIN  (GLUCOPHAGE ) 1000 MG tablet TAKE 1 TABLET BY MOUTH TWICE A DAY WITH FOOD 60 tablet 11   Ostomy Supplies (SKIN TAC ADHESIVE BARRIER WIPE) MISC Use to help apply Dexcom sensor every 10 days 100 each 1   OVER THE COUNTER MEDICATION Take 1 tablet by mouth 2 (two) times daily. Beet Root supplement     oxyCODONE  (ROXICODONE ) 5 MG immediate release tablet Take 1 tablet (5 mg total) by mouth every 4 (four) hours as needed for severe pain (pain score 7-10). 10 tablet 0   pantoprazole   (PROTONIX ) 40 MG tablet TAKE 1 TABLET BY MOUTH EVERY DAY 30 tablet 6   solifenacin  (VESICARE ) 5 MG tablet TAKE 1 TABLET (5 MG TOTAL) BY MOUTH DAILY. 30 tablet 11   tamsulosin  (FLOMAX ) 0.4 MG CAPS capsule TAKE 1 CAPSULE BY MOUTH EVERY DAY 30 capsule 11   traMADol  (ULTRAM ) 50 MG tablet Take 1 tablet (50 mg total) by mouth every 6 (six) hours as needed. 60 tablet 1   No current facility-administered medications on file prior to visit.  ROS:  All others reviewed and negative.  Objective        PE:  BP 138/84 (BP Location: Left Arm, Patient Position: Sitting, Cuff Size: Normal)   Pulse 95   Temp 98.2 F (36.8 C) (Oral)   Ht 5\' 8"  (1.727 m)   Wt 234 lb (106.1 kg)   SpO2 98%   BMI 35.58 kg/m                 Constitutional: Pt appears in NAD               HENT: Head: NCAT.                Right Ear: External ear normal.                 Left Ear: External ear normal.                Eyes: . Pupils are equal, round, and reactive to light. Conjunctivae and EOM are normal               Nose: without d/c or deformity               Neck: Neck supple. Gross normal ROM               Cardiovascular: Normal rate and regular rhythm.                 Pulmonary/Chest: Effort normal and breath sounds without rales or wheezing.                Abd:  Soft, NT, ND, + BS, no organomegaly; spine nontender in midline or lumbar paraspinal; has marked tender muscle spasm at left flank area.                Neurological: Pt is alert. At baseline orientation, motor grossly intact               Skin: Skin is warm. No rashes, no other new lesions, LE edema - none               Psychiatric: Pt behavior is normal without agitation   Micro: none  Cardiac tracings I have personally interpreted today:  none  Pertinent Radiological findings (summarize): none   Lab Results  Component Value Date   WBC 10.1 10/24/2023   HGB 13.9 10/24/2023   HCT 43.4 10/24/2023   PLT 416.0 (H) 10/24/2023   GLUCOSE 207 (H)  10/24/2023   CHOL 140 04/09/2023   TRIG 246.0 (H) 04/09/2023   HDL 32.00 (L) 04/09/2023   LDLDIRECT 78.0 03/24/2022   LDLCALC 59 04/09/2023   ALT 28 10/24/2023   AST 14 10/24/2023   NA 126 (L) 10/24/2023   K 4.4 10/24/2023   CL 89 (L) 10/24/2023   CREATININE 1.55 (H) 10/24/2023   BUN 33 (H) 10/24/2023   CO2 27 10/24/2023   TSH 2.96 10/24/2023   PSA 0.50 09/19/2022   HGBA1C 8.8 (H) 10/24/2023   MICROALBUR <0.2 10/02/2023   Assessment/Plan:  Steven [name] is a 63 y.o. Black or African American [2] male with  has a past medical history of Chicken pox, Diabetes mellitus without complication (HCC), Essential hypertension, GERD (gastroesophageal reflux disease), Gouty arthritis, Hypertension, Iron  deficiency anemia, and Vitamin D  deficiency.  Iron  deficiency anemia Also for iron  lab f/u  Vitamin D  deficiency Last vitamin D  Lab Results  Component Value Date   VD25OH >120 04/09/2023   Stable, cont oral  replacement   Diabetes (HCC) Lab Results  Component Value Date   HGBA1C 8.8 (H) 10/24/2023   Uncontrolled, encourage to be more compliant with med, pt to continue current medical treatment farxiga  10 mg every day, trulicity  0.75 mg weekly, glipizide  xl 10 mg bid as delcine other change for now   B12 deficiency Lab Results  Component Value Date   VITAMINB12 >1500 (H) 10/24/2023   Stable, cont oral replacement - b12 1000 mcg qd   Dysphagia Etiology unclear, for GI referral, may need repeat EGD  Left flank pain Appears to be msk strain with spasm in the area but quit severe for unclear reason;  afeb and non toxic, ok for percocet 7.5 qid prn, change flexeril  to robaxin 500 mg, for CXR also for labs including magnesium and CK  Dyspnea Also for cxr as above  Followup: Return in about 3 months (around 01/24/2024).  Rosalia Colonel, MD 10/24/2023 8:56 PM [facility]

## 2023-10-24 NOTE — Assessment & Plan Note (Signed)
 Etiology unclear, for GI referral, may need repeat EGD

## 2023-10-24 NOTE — Assessment & Plan Note (Signed)
 Appears to be msk strain with spasm in the area but quit severe for unclear reason;  afeb and non toxic, ok for percocet 7.5 qid prn, change flexeril  to robaxin 500 mg, for CXR also for labs including magnesium and CK

## 2023-10-24 NOTE — Assessment & Plan Note (Signed)
 Also for cxr as above

## 2023-10-24 NOTE — Assessment & Plan Note (Signed)
 Last vitamin D  Lab Results  Component Value Date   VD25OH >120 04/09/2023   Stable, cont oral replacement

## 2023-10-24 NOTE — Patient Instructions (Addendum)
 Ok to change the flexeril  to robaxin  as needed for muscle cramping  Please work on hydration  Please take all new medication as prescribed  - the percocet 7.5 mg as needed  Please continue all other medications as before, and refills have been done if requested.  Please have the pharmacy call with any other refills you may need.  Please keep your appointments with your specialists as you may have planned  You will be contacted regarding the referral for: Gastroenterology  Please go to the XRAY Department in the first floor for the x-ray testing  Please go to the LAB at the blood drawing area for the tests to be done to include the kidney function, magnesium   You will be contacted by phone if any changes need to be made immediately.  Otherwise, you will receive a letter about your results with an explanation, but please check with MyChart first.  Please make an Appointment to return in 3 months, or sooner if needed

## 2023-10-24 NOTE — Assessment & Plan Note (Signed)
Also for iron lab f/u 

## 2023-10-25 ENCOUNTER — Other Ambulatory Visit: Payer: Self-pay | Admitting: Internal Medicine

## 2023-10-25 ENCOUNTER — Ambulatory Visit: Payer: Self-pay | Admitting: Internal Medicine

## 2023-10-25 DIAGNOSIS — R7989 Other specified abnormal findings of blood chemistry: Secondary | ICD-10-CM

## 2023-10-25 LAB — D-DIMER, QUANTITATIVE: D-Dimer, Quant: 0.67 ug{FEU}/mL — ABNORMAL HIGH (ref <0.50)

## 2023-10-26 ENCOUNTER — Ambulatory Visit (INDEPENDENT_AMBULATORY_CARE_PROVIDER_SITE_OTHER): Admitting: Podiatry

## 2023-10-26 ENCOUNTER — Ambulatory Visit (HOSPITAL_BASED_OUTPATIENT_CLINIC_OR_DEPARTMENT_OTHER)
Admission: RE | Admit: 2023-10-26 | Discharge: 2023-10-26 | Disposition: A | Discharge status: Home / Self Care | Source: Ambulatory Visit | Attending: Internal Medicine | Admitting: Internal Medicine

## 2023-10-26 ENCOUNTER — Ambulatory Visit: Payer: Self-pay | Admitting: Internal Medicine

## 2023-10-26 ENCOUNTER — Encounter: Payer: Self-pay | Admitting: Podiatry

## 2023-10-26 DIAGNOSIS — R0602 Shortness of breath: Secondary | ICD-10-CM | POA: Diagnosis not present

## 2023-10-26 DIAGNOSIS — R7989 Other specified abnormal findings of blood chemistry: Secondary | ICD-10-CM | POA: Diagnosis present

## 2023-10-26 DIAGNOSIS — L600 Ingrowing nail: Secondary | ICD-10-CM

## 2023-10-26 DIAGNOSIS — R160 Hepatomegaly, not elsewhere classified: Secondary | ICD-10-CM | POA: Diagnosis not present

## 2023-10-26 MED ORDER — IOHEXOL 350 MG/ML SOLN
75.0000 mL | Freq: Once | INTRAVENOUS | Status: AC | PRN
  Administered 2023-10-26: 75 mL via INTRAVENOUS

## 2023-10-26 NOTE — Progress Notes (Signed)
       Subjective:  Patient ID: Steven [name], male    DOB: Jun 10, 1960,  MRN: [id number]  Chief Complaint  Patient presents with   Nail check     Right 1st nail. Patient reports decreased pain. Last A1c: 8.8.     Steven [name] presents to clinic today for f/u of PNA to the right hallux medial border.  He is doing well with this.  Denies any pain.  PCP is [name], MD.  No Known Allergies  Objective:  There were no vitals filed for this visit.  Vascular Examination: Capillary refill time is less than 3 seconds to toes bilateral. Palpable pedal pulses b/l LE. Digital hair present b/l. No pedal edema b/l. Skin temperature gradient WNL b/l. No varicosities b/l. No cyanosis or clubbing noted b/l.   Dermatological Examination: Upon inspection of the PNA site, there are no clinical signs of infection.  No purulence, no necrosis, no malodor present.  Minimal to no erythema present.  Eschar formed along nail margin.  Minimal to no pain on palpation of area.   Assessment/Plan: 1. Ingrown toenail of right foot     No orders of the defined types were placed in this encounter.  P&A site right hallux medial border healing well.  Scabbing formed.  Advised keeping this area covered with manage for a couple more days however is healing well at this point  Return in about 10 weeks (around 01/04/2024) for Diabetic Foot Care.  May return sooner if symptoms recur or worsen or new pedal complaints arise.   [name] L. [name], AACFAS [facility]     [address] [place], [place] [address]                Office [phone]  Fax [phone]

## 2023-10-31 ENCOUNTER — Other Ambulatory Visit: Payer: Self-pay

## 2023-10-31 ENCOUNTER — Other Ambulatory Visit: Payer: Self-pay | Admitting: Internal Medicine

## 2023-10-31 DIAGNOSIS — E871 Hypo-osmolality and hyponatremia: Secondary | ICD-10-CM

## 2023-10-31 DIAGNOSIS — R7989 Other specified abnormal findings of blood chemistry: Secondary | ICD-10-CM

## 2023-10-31 MED ORDER — POLYSACCHARIDE IRON COMPLEX 150 MG PO CAPS
150.0000 mg | ORAL_CAPSULE | Freq: Every day | ORAL | 1 refills | Status: AC
  Filled 2023-10-31: qty 90, 90d supply, fill #0

## 2023-11-01 ENCOUNTER — Telehealth: Payer: Self-pay | Admitting: Internal Medicine

## 2023-11-01 DIAGNOSIS — R7989 Other specified abnormal findings of blood chemistry: Secondary | ICD-10-CM

## 2023-11-01 NOTE — Telephone Encounter (Signed)
 Copied from CRM [id number]. Topic: Clinical - Request for Lab/Test Order >> Nov 01, 2023 11:16 AM Artemio Larry wrote: Reason for CRM: Soyla Duverney from Imaging needs CT Angio PR order location changed to [facility], [address] Her call back number is [phone] ext [phone].

## 2023-11-01 NOTE — Telephone Encounter (Signed)
 Ok this is done

## 2023-11-01 NOTE — Telephone Encounter (Signed)
 Copied from CRM [id number]. Topic: Referral - Status >> Nov 01, 2023  4:14 PM Felizardo Hotter wrote: Reason for CRM: Received call from [facility] per [facility] ph: [phone] address needs to be updated to [address], [place] [address] for referral# [id number]

## 2023-11-02 NOTE — Telephone Encounter (Signed)
 Order was already changed by Dr.John.

## 2023-11-08 ENCOUNTER — Encounter: Payer: Self-pay | Admitting: "Endocrinology

## 2023-11-08 ENCOUNTER — Encounter: Attending: "Endocrinology | Admitting: Dietician

## 2023-11-08 ENCOUNTER — Encounter: Payer: Self-pay | Admitting: Dietician

## 2023-11-08 ENCOUNTER — Other Ambulatory Visit: Payer: Self-pay

## 2023-11-08 ENCOUNTER — Ambulatory Visit (INDEPENDENT_AMBULATORY_CARE_PROVIDER_SITE_OTHER): Admitting: "Endocrinology

## 2023-11-08 VITALS — BP 108/64 | HR 84 | Ht 68.0 in | Wt 240.0 lb

## 2023-11-08 VITALS — Wt 240.0 lb

## 2023-11-08 DIAGNOSIS — Z794 Long term (current) use of insulin: Secondary | ICD-10-CM

## 2023-11-08 DIAGNOSIS — Z7985 Long-term (current) use of injectable non-insulin antidiabetic drugs: Secondary | ICD-10-CM | POA: Diagnosis not present

## 2023-11-08 DIAGNOSIS — E11649 Type 2 diabetes mellitus with hypoglycemia without coma: Secondary | ICD-10-CM | POA: Diagnosis not present

## 2023-11-08 DIAGNOSIS — E1142 Type 2 diabetes mellitus with diabetic polyneuropathy: Secondary | ICD-10-CM | POA: Diagnosis not present

## 2023-11-08 DIAGNOSIS — E782 Mixed hyperlipidemia: Secondary | ICD-10-CM

## 2023-11-08 DIAGNOSIS — Z7984 Long term (current) use of oral hypoglycemic drugs: Secondary | ICD-10-CM | POA: Diagnosis not present

## 2023-11-08 MED ORDER — TRULICITY 1.5 MG/0.5ML ~~LOC~~ SOAJ
1.5000 mg | SUBCUTANEOUS | 0 refills | Status: DC
  Filled 2023-11-08: qty 2, 28d supply, fill #0

## 2023-11-08 MED ORDER — TRULICITY 1.5 MG/0.5ML ~~LOC~~ SOAJ: 1.5000 mg | SUBCUTANEOUS | 0 refills | Status: DC

## 2023-11-08 NOTE — Patient Instructions (Addendum)
 FRIO cooler bag for insulin  (consider getting this to be sure your insulin  does not get hot this summer)  Continue treatment for any hypoglycemia.  Choose beverages without sugar or other carbohydrates most often.

## 2023-11-08 NOTE — Patient Instructions (Signed)
 Trulicity  0.75mg /week->1.5 mg/week  Farxiga  10 mg daily, metformin  1 g twice daily, glipizide  XL 10 mg bid  25 NPH am and 20 hs (take 15 min before break fast and dinner) Humalog  correction scale: Use as needed based on blood sugars as follows:  151 - 175: 1 unit 176 - 200: 2 units 201 - 225: 3 units 226 - 250: 4 units 251 - 275: 5 units 276 - 300: 6 units 301 - 325: 7 units 326 - 350: 8 units 351 - 375: 9 units 376 - 400: 10 units

## 2023-11-08 NOTE — Progress Notes (Signed)
 Outpatient Endocrinology Note Jorge Newcomer, MD  11/08/23   Steven [name] Oct 03, 1950 [id number]  Referring Provider: [name], MD Primary Care Provider: [name], MD Reason for consultation: Subjective   Assessment & Plan  Diagnoses and all orders for this visit:  Uncontrolled type 2 diabetes mellitus with hypoglycemia without coma (HCC)  Long-term insulin  use (HCC)  Long term (current) use of oral hypoglycemic drugs  Long-term (current) use of injectable non-insulin  antidiabetic drugs  Mixed hypercholesterolemia and hypertriglyceridemia  Other orders -     Discontinue: Dulaglutide  (TRULICITY ) 1.5 MG/0.5ML SOAJ; Inject 1.5 mg into the skin once a week. -     Dulaglutide  (TRULICITY ) 1.5 MG/0.5ML SOAJ; Inject 1.5 mg into the skin once a week.     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  Humalog  correction scale: Use as needed based on blood sugars as follows:  151 - 175: 1 unit 176 - 200: 2 units 201 - 225: 3 units 226 - 250: 4 units 251 - 275: 5 units 276 - 300: 6 units 301 - 325: 7 units 326 - 350: 8 units 351 - 375: 9 units 376 - 400: 10 units   Goal is to decrease humalog  insulin  once settled on Trulicity   Check BG before meals Ordered DM education and instructed pt to bring all insulins to help with timings of intake as patient doesn't remember names of insulin   Use skin tac to allow dexcom to stay put  No known contraindications to any of above medications Glucagon  discussed and prescribed with refills on 12/04/22  -Last LD and  Tg are as follows: Lab Results  Component Value Date   LDLCALC 59 04/09/2023    Lab Results  Component Value Date   TRIG 246.0 (H) 04/09/2023   -on lovastatin  40 mg every day -Follow low fat diet and exercise   -Blood pressure goal <140/90 - Microalbumin/creatinine goal < 30 -Last MA/Cr is as follows: Lab Results  Component Value Date   MICROALBUR <0.2 10/02/2023   -on ACE/ARB lisinopril  5 mg every day -diet changes including salt restriction -limit eating outside -counseled BP targets per standards of diabetes care -uncontrolled blood pressure can lead to retinopathy, nephropathy and cardiovascular and atherosclerotic heart disease  Reviewed and counseled on: -A1C target -Blood sugar targets -Complications of uncontrolled diabetes  -Checking blood sugar before meals and bedtime and bring log next visit -All medications with mechanism of action and side effects -Hypoglycemia management: rule of 15's, Glucagon  Emergency Kit and medical alert ID -low-carb low-fat plate-method diet -At least 20 minutes of physical activity per day -Annual dilated retinal eye exam and foot exam -compliance and follow up needs -follow up as scheduled or earlier if problem gets worse  Call if blood sugar is less than 70 or consistently above 250    Take a 15 gm snack of carbohydrate at bedtime before you go to sleep if your blood sugar is less than 100.    If you are going to fast after midnight for a  test or procedure, ask your physician for instructions on how to reduce/decrease your insulin  dose.    Call if blood sugar is less than 70 or consistently above 250  -Treating a low sugar by rule of 15  (15 gms of sugar every 15 min until sugar is more than 70) If you feel your sugar is low, test your sugar to be sure If your sugar is low (less than 70), then take 15 grams of a fast acting Carbohydrate (3-4 glucose tablets or glucose gel or 4 ounces of juice or regular soda) Recheck your sugar  15 min after treating low to make sure it is more than 70 If sugar is still less than 70, treat again with 15 grams of carbohydrate          Don't drive the hour of hypoglycemia  If unconscious/unable to eat or drink by mouth, use glucagon  injection or nasal spray baqsimi and call 911. Can repeat again in 15 min if still unconscious.  Return in about 12 days (around 11/20/2023).   I have reviewed current medications, nurse's notes, allergies, vital signs, past medical and surgical history, family medical history, and social history for this encounter. Counseled patient on symptoms, examination findings, lab findings, imaging results, treatment decisions and monitoring and prognosis. The patient understood the recommendations and agrees with the treatment plan. All questions regarding treatment plan were fully answered.  Jorge Newcomer, MD  11/08/23  History of Present Illness Steven [name] is a 63 y.o. year old male who presents for follow up of Type II diabetes mellitus.  Steven [name] was first diagnosed in 2010.   Diabetes education +  Home diabetes regimen: Farxiga  10 mg daily, metformin  1 g twice daily, glipizide  XL 10 mg bid  25 NPH am and 20 hs  Humalog  15-20 units after break fast, 10 units after dinner Trulicity  0.75mg /week  Previous history:     Previously on metformin  and started on insulin  in 10/21 He has only been on NPH A1c range in the last few years: 8.1-12.1  COMPLICATIONS -  MI/Stroke -  retinopathy +  neuropathy +  nephropathy  BLOOD SUGAR DATA  CGM interpretation: At today's visit, we reviewed her CGM downloads. The full report is scanned in the media. Reviewing the CGM trends, BG are low across the day.  Physical Exam  BP 108/64   Pulse 84   Ht 5' 8 (1.727 m)   Wt 240 lb (108.9 kg)   SpO2 98%   BMI 36.49 kg/m    Constitutional: well developed, well nourished Head: normocephalic, atraumatic Eyes: sclera anicteric, no redness Neck:  supple Lungs: normal respiratory effort Neurology: alert and oriented Skin: dry, no appreciable rashes Musculoskeletal: no appreciable defects Psychiatric: normal mood and affect Diabetic Foot Exam - Simple   No data filed      Current Medications Patient's Medications  New Prescriptions   DULAGLUTIDE  (TRULICITY ) 1.5 MG/0.5ML SOAJ    Inject 1.5 mg into the skin once a week.  Previous Medications   ALLOPURINOL  (ZYLOPRIM ) 100 MG TABLET    TAKE 1 TABLET BY MOUTH TWICE A DAY   CONTINUOUS GLUCOSE SENSOR (DEXCOM G7 SENSOR) MISC    1 Device by Does not apply route as directed. Change sensor every 10 days   DAPAGLIFLOZIN  PROPANEDIOL (FARXIGA ) 10 MG TABS TABLET    Take 1 tablet (10 mg total) by mouth daily.   DICLOFENAC  SODIUM (VOLTAREN ) 1 % GEL    Apply 2 g topically 4 (four) times daily.  FERROUS SULFATE  324 (65 FE) MG TBEC    Take 1 tablet (325 mg total) by mouth daily.   GABAPENTIN  (NEURONTIN ) 100 MG CAPSULE    Take 2 capsules (200 mg total) by mouth at bedtime.   GLIPIZIDE  (GLUCOTROL  XL) 10 MG 24 HR TABLET    Take 1 tablet (10 mg total) by mouth 2 (two) times daily.   HUMULIN  N 100 UNIT/ML INJECTION    Inject up to 30 units in morning and 40 units at bedtime   HYDROCHLOROTHIAZIDE  (HYDRODIURIL ) 25 MG TABLET    TAKE 1 TABLET BY MOUTH EVERY DAY   IBUPROFEN  (ADVIL ) 600 MG TABLET    Take 600 mg by mouth every 6 (six) hours as needed.   INSULIN  ASPART (NOVOLOG  FLEXPEN) 100 UNIT/ML FLEXPEN    Inject 25 Units into the skin daily before breakfast.   INSULIN  LISPRO (HUMALOG  KWIKPEN) 100 UNIT/ML KWIKPEN    Humalog  22 units after break fast, 18 units after dinner   INSULIN  PEN NEEDLE (RELION PEN NEEDLES) 32G X 4 MM MISC    1 Needle by Does not apply route 2 (two) times daily.   INSULIN  SYRINGE-NEEDLE U-100 (RELION INSULIN  SYRINGE) 31G X 15/64 0.5 ML MISC    Use to inject insulin  twice a day.   IRON  POLYSACCHARIDES (NU-IRON ) 150 MG CAPSULE    Take 1 capsule (150 mg total) by mouth daily.   LISINOPRIL   (ZESTRIL ) 5 MG TABLET    Take 5 mg by mouth daily.   LOVASTATIN  (MEVACOR ) 40 MG TABLET    TAKE 1 TABLET BY MOUTH EVERYDAY AT BEDTIME   MELOXICAM  (MOBIC ) 15 MG TABLET    TAKE 1 TABLET BY MOUTH EVERY DAY AS NEEDED FOR PAIN   METFORMIN  (GLUCOPHAGE ) 1000 MG TABLET    TAKE 1 TABLET BY MOUTH TWICE A DAY WITH FOOD   METHOCARBAMOL  (ROBAXIN ) 500 MG TABLET    Take 1 tablet (500 mg total) by mouth 4 (four) times daily.   OSTOMY SUPPLIES (SKIN TAC ADHESIVE BARRIER WIPE) MISC    Use to help apply Dexcom sensor every 10 days   OVER THE COUNTER MEDICATION    Take 1 tablet by mouth 2 (two) times daily. Beet Root supplement   OXYCODONE  (ROXICODONE ) 5 MG IMMEDIATE RELEASE TABLET    Take 1 tablet (5 mg total) by mouth every 4 (four) hours as needed for severe pain (pain score 7-10).   OXYCODONE -ACETAMINOPHEN  (PERCOCET) 7.5-325 MG TABLET    Take 1 tablet by mouth every 6 (six) hours as needed for severe pain (pain score 7-10).   PANTOPRAZOLE  (PROTONIX ) 40 MG TABLET    TAKE 1 TABLET BY MOUTH EVERY DAY   SOLIFENACIN  (VESICARE ) 5 MG TABLET    TAKE 1 TABLET (5 MG TOTAL) BY MOUTH DAILY.   TAMSULOSIN  (FLOMAX ) 0.4 MG CAPS CAPSULE    TAKE 1 CAPSULE BY MOUTH EVERY DAY   TRAMADOL  (ULTRAM ) 50 MG TABLET    Take 1 tablet (50 mg total) by mouth every 6 (six) hours as needed.  Modified Medications   No medications on file  Discontinued Medications   DULAGLUTIDE  (TRULICITY ) 0.75 MG/0.5ML SOAJ    Inject 0.75 mg into the skin once a week.    Allergies No Known Allergies  Past Medical History Past Medical History:  Diagnosis Date   Chicken pox    Diabetes mellitus without complication (HCC)    Essential hypertension    GERD (gastroesophageal reflux disease)    Gouty arthritis    Hypertension  Iron  deficiency anemia    Vitamin D  deficiency     Past Surgical History Past Surgical History:  Procedure Laterality Date   BIOPSY  12/05/2021   Procedure: BIOPSY;  Surgeon: Albertina Hugger, MD;  Location: [facility];   Service: Gastroenterology;;   ESOPHAGOGASTRODUODENOSCOPY N/A 12/05/2021   Procedure: ESOPHAGOGASTRODUODENOSCOPY (EGD);  Surgeon: Albertina Hugger, MD;  Location: [facility];  Service: Gastroenterology;  Laterality: N/A;   HOT HEMOSTASIS N/A 12/05/2021   Procedure: HOT HEMOSTASIS (ARGON PLASMA COAGULATION/BICAP);  Surgeon: Albertina Hugger, MD;  Location: [facility];  Service: Gastroenterology;  Laterality: N/A;   INGUINAL HERNIA REPAIR Right 12/19/2017   Procedure: HERNIA REPAIR INGUINAL RIGHT;  Surgeon: Oza Blumenthal, MD;  Location: [facility];  Service: General;  Laterality: Right;   INSERTION OF MESH Right 12/19/2017   Procedure: INSERTION OF MESH;  Surgeon: Oza Blumenthal, MD;  Location: [facility];  Service: General;  Laterality: Right;   MASS EXCISION Right 12/19/2017   Procedure: EXCISION OF RIGHT SCROTAL MASS;  Surgeon: Oza Blumenthal, MD;  Location: [facility];  Service: General;  Laterality: Right;   TONSILLECTOMY      Family History family history includes Diabetes in his mother; Heart disease in his mother; Hypertension in his father and mother; Stroke in his brother and father.  Social History Social History   Socioeconomic History   Marital status: Divorced    Spouse name: Not on file   Number of children: 1   Years of education: 12   Highest education level: 12th grade  Occupational History   Occupation: retired  Tobacco Use   Smoking status: Former    Current packs/day: 2.00    Average packs/day: 2.0 packs/day for 25.0 years (50.0 ttl pk-yrs)    Types: Cigarettes   Smokeless tobacco: Former    Types: Engineer, drilling   Vaping status: Never Used  Substance and Sexual Activity   Alcohol use: Not Currently   Drug use: No   Sexual activity: Yes    Birth control/protection: Condom  Other Topics Concern   Not on file  Social History Narrative   Fun: Just working   Social Drivers of Corporate investment banker Strain: Low Risk  (05/16/2023)   Overall  Financial Resource Strain (CARDIA)    Difficulty of Paying Living Expenses: Not very hard  Recent Concern: Financial Resource Strain - Medium Risk (04/07/2023)   Overall Financial Resource Strain (CARDIA)    Difficulty of Paying Living Expenses: Somewhat hard  Food Insecurity: No Food Insecurity (05/16/2023)   Hunger Vital Sign    Worried About Running Out of Food in the Last Year: Never true    Ran Out of Food in the Last Year: Never true  Recent Concern: Food Insecurity - Food Insecurity Present (04/07/2023)   Hunger Vital Sign    Worried About Running Out of Food in the Last Year: Sometimes true    Ran Out of Food in the Last Year: Sometimes true  Transportation Needs: No Transportation Needs (05/16/2023)   PRAPARE - Administrator, Civil Service (Medical): No    Lack of Transportation (Non-Medical): No  Physical Activity: Insufficiently Active (05/16/2023)   Exercise Vital Sign    Days of Exercise per Week: 2 days    Minutes of Exercise per Session: 10 min  Stress: No Stress Concern Present (05/16/2023)   [employer] of Occupational Health - Occupational Stress Questionnaire    Feeling of Stress : Not at all  Social Connections: Moderately Isolated (05/16/2023)   Social Connection and Isolation Panel    Frequency of Communication with Friends and Family: More than three times a week    Frequency of Social Gatherings with Friends and Family: Once a week    Attends Religious Services: 1 to 4 times per year    Active Member of Clubs or Organizations: No    Attends Engineer, structural: Not on file    Marital Status: Divorced  Intimate Partner Violence: Not on file    Lab Results  Component Value Date   HGBA1C 8.8 (H) 10/24/2023   HGBA1C 7.6 (A) 09/03/2023   HGBA1C 8.6 (H) 04/09/2023   Lab Results  Component Value Date   CHOL 140 04/09/2023   Lab Results  Component Value Date   HDL 32.00 (L) 04/09/2023   Lab Results  Component Value Date    LDLCALC 59 04/09/2023   Lab Results  Component Value Date   TRIG 246.0 (H) 04/09/2023   Lab Results  Component Value Date   CHOLHDL 4 04/09/2023   Lab Results  Component Value Date   CREATININE 1.55 (H) 10/24/2023   Lab Results  Component Value Date   GFR 46.75 (L) 10/24/2023   Lab Results  Component Value Date   MICROALBUR <0.2 10/02/2023      Component Value Date/Time   NA 126 (L) 10/24/2023 1452   K 4.4 10/24/2023 1452   CL 89 (L) 10/24/2023 1452   CO2 27 10/24/2023 1452   GLUCOSE 207 (H) 10/24/2023 1452   BUN 33 (H) 10/24/2023 1452   CREATININE 1.55 (H) 10/24/2023 1452   CALCIUM 9.8 10/24/2023 1452   PROT 8.3 10/24/2023 1452   ALBUMIN 4.5 10/24/2023 1452   AST 14 10/24/2023 1452   ALT 28 10/24/2023 1452   ALKPHOS 90 10/24/2023 1452   BILITOT 0.7 10/24/2023 1452   GFRNONAA 54 (L) 10/18/2023 1930   GFRAA >60 01/30/2018 2037      Latest Ref Rng & Units 10/24/2023    2:52 PM 10/18/2023    7:30 PM 06/04/2023    4:03 PM  BMP  Glucose 70 - 99 mg/dL 161  096  86   BUN 6 - 23 mg/dL 33  21  35   Creatinine 0.40 - 1.50 mg/dL 0.45  4.09  8.11   Sodium 135 - 145 mEq/L 126  135  133   Potassium 3.5 - 5.1 mEq/L 4.4  4.6  4.0   Chloride 96 - 112 mEq/L 89  100  100   CO2 19 - 32 mEq/L 27  23  22    Calcium 8.4 - 10.5 mg/dL 9.8  9.9  9.7        Component Value Date/Time   WBC 10.1 10/24/2023 1452   RBC 5.54 10/24/2023 1452   HGB 13.9 10/24/2023 1452   HCT 43.4 10/24/2023 1452   PLT 416.0 (H) 10/24/2023 1452   MCV 78.4 10/24/2023 1452   MCH 25.0 (L) 10/18/2023 1930   MCHC 32.1 10/24/2023 1452   RDW 17.9 (H) 10/24/2023 1452   LYMPHSABS 2.3 10/24/2023 1452   MONOABS 1.0 10/24/2023 1452   EOSABS 0.3 10/24/2023 1452   BASOSABS 0.0 10/24/2023 1452     Parts of this note may have been dictated using voice recognition software. There may be variances in spelling and vocabulary which are unintentional. Not all errors are proofread. Please notify the [facility] if any  discrepancies are noted or if the meaning of any statement is  not clear.

## 2023-11-08 NOTE — Progress Notes (Signed)
 Diabetes Self-Management Education  Visit Type: Follow-up  Appt. Start Time: 4782 Appt. End Time: 1028  11/09/2023  [name], identified by name and date of birth, is a 63 y.o. male with a diagnosis of Diabetes: Type 2.   ASSESSMENT Patient is here today alone.  He was last seen by this RD on 08/09/2023. Forgets to take his meal time insulin  until after he eats (particularly for dinner).  He has been decreasing his insulin  due to hypoglycemia. He has reduced his insulin  this week due to consistent low blood glucose (10% on the Dexcom report over the past 2 weeks).  Now taking Novolin N 25 units q am and HS (decreased from 25 units q am and 30 units q HS) and Humalog  15 units before breakfast and 10 units after dinner ( decreased from 26 units before breakfast and 20 units after dinner).  He has been counseled to take the Humalog  15 minutes before meal. Messaged MD who will see him now to further reduce his medication.  Noted that N insulin  has been decreased.  History includes:  Type 2 Diabetes, HTN, history of vitamin D  deficiency, iron  deficient anemia Blood Glucose Meter:  Relion Premier Medications include Farxiga , Metformin , Glipizide , Trulicity , Humulin  N 25 q am and 25 q HS, Humalog  15 before breakfast and 10 units after dinner, iron , Vitamin D , Vitamin B Last A1C 8.8% 10/24/2023 increased from 7.6% 09/03/2023, c-peptide 7.02 on 04/20/2023, eGFR 60 06/04/2023, Vitamin D  120 on 04/09/23, Vitamin B-12 331 09/19/2022 CGM:  Dexcom G7 -   CGM Results from download: 11/08/2023  % Time CGM active:   85 %   (Goal >70%)  Average glucose:   112 mg/dL for 14 days  Glucose management indicator:   6 %  Time in range (70-180 mg/dL):   85 %   (Goal >95%)  Time High (181-250 mg/dL):   5 %   (Goal < 62%)  Time Very High (>250 mg/dL):    0 %   (Goal < 5%)  Time Low (54-69 mg/dL):   6 %   (Goal <1%)  Time Very Low (<54 mg/dL):   4 %   (Goal <3%)  %CV (glucose variability)    31.6 %  (Goal <36%)       68 240 lbs 11/08/2023 243 lbs 08/09/2023 232 lbs 2021   Social hx:  Intermittently homeless.  No refrigerator and no stove where he lives.  He can reheat his food. He works Holiday representative at times. He has a car  Weight 240 lb (108.9 kg). Body mass index is 36.49 kg/m.   Diabetes Self-Management Education - 11/09/23 1645       Visit Information   Visit Type Follow-up      Initial Visit   Diabetes Type Type 2    Are you taking your medications as prescribed? No      Psychosocial Assessment   Patient Belief/Attitude about Diabetes Motivated to manage diabetes   but many barriers   What is the hardest part about your diabetes right now, causing you the most concern, or is the most worrisome to you about your diabetes?   Making healty food and beverage choices;Getting support / problem solving    Self-care barriers Lack of material resources    Self-management support Doctor's office;CDE visits    Other persons present Patient    Patient Concerns Nutrition/Meal planning    Special Needs None    Preferred Learning Style No preference indicated    Learning  Readiness Ready    How often do you need to have someone help you when you read instructions, pamphlets, or other written materials from your doctor or pharmacy? 1 - Never      Pre-Education Assessment   Patient understands the diabetes disease and treatment process. Needs Review    Patient understands incorporating nutritional management into lifestyle. Needs Review    Patient undertands incorporating physical activity into lifestyle. Needs Review    Patient understands using medications safely. Needs Review    Patient understands monitoring blood glucose, interpreting and using results Needs Review    Patient understands prevention, detection, and treatment of acute complications. Needs Review    Patient understands prevention, detection, and treatment of chronic complications. Needs Review    Patient understands how to  develop strategies to address psychosocial issues. Needs Review    Patient understands how to develop strategies to promote health/change behavior. Needs Review      Complications   Last HgB A1C per patient/outside source 8.8 %   10/24/2023 increased from 7.6% 09/03/2023   How often do you check your blood sugar? > 4 times/day    Number of hypoglycemic episodes per month 12    Can you tell when your blood sugar is low? Yes    What do you do if your blood sugar is low? drinks juice, decreases insulin     Number of hyperglycemic episodes ( >200mg /dL): Weekly      Dietary Intake   Breakfast hamburger steak, potatoes, cabbage    Snack (morning) none    Lunch [employer] (afternoon) none    Dinner Fried chicken, okra    Snack (evening) none    Beverage(s) water, coffee with SF creamer, regular gatorade, sweet tea OR regular coke      Activity / Exercise   Activity / Exercise Type Moderate (swimming / aerobic walking)    How many days per week do you exercise? 7    How many minutes per day do you exercise? 60    Total minutes per week of exercise 420      Patient Education   Previous Diabetes Education Yes (please comment)    Healthy Eating Information on hints to eating out and maintain blood glucose control.    Being Active Role of exercise on diabetes management, blood pressure control and cardiac health.    Medications Reviewed patients medication for diabetes, action, purpose, timing of dose and side effects.    Monitoring Identified appropriate SMBG and/or A1C goals.    Acute complications Taught prevention, symptoms, and  treatment of hypoglycemia - the 15 rule.;Discussed and identified patients' prevention, symptoms, and treatment of hyperglycemia.    Diabetes Stress and Support Identified and addressed patients feelings and concerns about diabetes;Worked with patient to identify barriers to care and solutions;Brainstormed with patient on coping mechanisms for social  situations, getting support from significant others, dealing with feelings about diabetes      Individualized Goals (developed by patient)   Nutrition General guidelines for healthy choices and portions discussed    Physical Activity Exercise 5-7 days per week;60 minutes per day    Medications take my medication as prescribed    Monitoring  Consistenly use CGM    Problem Solving Eating Pattern;Medication consistency;Addressing barriers to behavior change    Reducing Risk examine blood glucose patterns;treat hypoglycemia with 15 grams of carbs if blood glucose less than 70mg /dL      Patient Self-Evaluation of Goals - Patient rates self  as meeting previously set goals (% of time)   Nutrition 50 - 75 % (half of the time)    Physical Activity >75% (most of the time)    Medications 50 - 75 % (half of the time)    Monitoring >75% (most of the time)    Problem Solving and behavior change strategies  50 - 75 % (half of the time)    Reducing Risk (treating acute and chronic complications) >75% (most of the time)    Health Coping 50 - 75 % (half of the time)      Post-Education Assessment   Patient understands the diabetes disease and treatment process. Demonstrates understanding / competency    Patient understands incorporating nutritional management into lifestyle. Comprehends key points    Patient undertands incorporating physical activity into lifestyle. Demonstrates understanding / competency    Patient understands using medications safely. Demonstrates understanding / competency    Patient understands monitoring blood glucose, interpreting and using results Demonstrates understanding / competency    Patient understands prevention, detection, and treatment of acute complications. Demonstrates understanding / competency    Patient understands prevention, detection, and treatment of chronic complications. Demonstrates understanding / competency    Patient understands how to develop strategies to  address psychosocial issues. Demonstrates understanding / competency    Patient understands how to develop strategies to promote health/change behavior. Comprehends key points      Outcomes   Expected Outcomes Demonstrated interest in learning. Expect positive outcomes    Future DMSE 2 months    Program Status Not Completed      Subsequent Visit   Since your last visit have you continued or begun to take your medications as prescribed? No    Since your last visit have you had your blood pressure checked? Yes    Since your last visit have you experienced any weight changes? Loss    Weight Loss (lbs) 3    Since your last visit, are you checking your blood glucose at least once a day? Yes          Individualized Plan for Diabetes Self-Management Training:   Learning Objective:  Patient will have a greater understanding of diabetes self-management. Patient education plan is to attend individual and/or group sessions per assessed needs and concerns.   Plan:   Patient Instructions  FRIO cooler bag for insulin  (consider getting this to be sure your insulin  does not get hot this summer)  Continue treatment for any hypoglycemia.  Choose beverages without sugar or other carbohydrates most often.  Expected Outcomes:  Demonstrated interest in learning. Expect positive outcomes  Education material provided:   If problems or questions, patient to contact team via:  Phone  Future DSME appointment: 2 months

## 2023-11-09 ENCOUNTER — Encounter: Payer: Self-pay | Admitting: "Endocrinology

## 2023-11-16 ENCOUNTER — Telehealth: Payer: Self-pay | Admitting: Internal Medicine

## 2023-11-16 MED ORDER — OXYCODONE-ACETAMINOPHEN 7.5-325 MG PO TABS
1.0000 | ORAL_TABLET | Freq: Four times a day (QID) | ORAL | 0 refills | Status: DC | PRN
  Filled 2023-11-16: qty 30, 8d supply, fill #0

## 2023-11-16 MED ORDER — METHOCARBAMOL 500 MG PO TABS
500.0000 mg | ORAL_TABLET | Freq: Four times a day (QID) | ORAL | 2 refills | Status: DC
  Filled 2023-11-16: qty 60, 15d supply, fill #0

## 2023-11-16 NOTE — Telephone Encounter (Unsigned)
 Copied from CRM [id number]. Topic: Clinical - Medication Question >> Nov 16, 2023 12:54 PM Steven [name] wrote: Reason for CRM: Patient is calling in requesting the muscle relaxer and pain medication refilled. Patient states he is completely out of them but does not know the names of the medications, patient is stating he is in pain and has not been able to get into the specialist yet.

## 2023-11-16 NOTE — Telephone Encounter (Signed)
 Ok this is done erx x 2

## 2023-11-19 ENCOUNTER — Telehealth: Payer: Self-pay | Admitting: Family Medicine

## 2023-11-19 ENCOUNTER — Other Ambulatory Visit: Payer: Self-pay

## 2023-11-19 DIAGNOSIS — N509 Disorder of male genital organs, unspecified: Secondary | ICD-10-CM | POA: Diagnosis not present

## 2023-11-19 DIAGNOSIS — N430 Encysted hydrocele: Secondary | ICD-10-CM | POA: Diagnosis not present

## 2023-11-19 DIAGNOSIS — D4412 Neoplasm of uncertain behavior of left adrenal gland: Secondary | ICD-10-CM | POA: Diagnosis not present

## 2023-11-19 NOTE — Telephone Encounter (Signed)
 Pt called, was recently at the ED for some pain and they referred him to [facility]. He had a visit today and per pt, they stated that pain is coming from his lower back.  FYI for appt 6/30.

## 2023-11-19 NOTE — Progress Notes (Unsigned)
 Steven [name] [address] [place] [address] Phone: [phone] Subjective:   Steven [name] Steven [name], am serving as a scribe for Dr. Arthea Claudene.  I'm seeing this patient by the request  of:  Steven Lynwood ORN, Steven [name]  CC: hand pain   YEP:Dlagzrupcz  10/18/2023 Severe pain in the left testicle with some swelling noted compared to the contralateral side. Patient does have a past medical history for hernias as well. Concerned that this could be a potential testicular torsion and that due to this being only 3 hours of constant pain. Did refer patient to the emergency room if any intervention is necessary. Differential is still quite broad including the potential infectious etiology or potential kidney stone with referred pain. Has had a incarcerated hernia previously as well on the right side. Patient appears to be very uncomfortable. Came to me for more finger pain but we will further evaluate that at next follow-up. Discussed transportation but patient wanted to go in private car. Have discussed with charge nurse at the and they will see him appropriately.   Updated 11/26/2023 Steven [name] is a 63 y.o. male coming in with complaint of B hand pain. Fingers on both hands are sticking at night.   Pain in lower back and L hip after he had testicle pain. Painful to pick up items. Continues to have pain in L testicle.   A1c worse 8.8 Iron  is low  Kidney function down creatinine 1.55/ GFR 46.75  .     Past Medical History:  Diagnosis Date   Chicken pox    Diabetes mellitus without complication (HCC)    Essential hypertension    GERD (gastroesophageal reflux disease)    Gouty arthritis    Hypertension    Iron  deficiency anemia    Vitamin D  deficiency    Past Surgical History:  Procedure Laterality Date   BIOPSY  12/05/2021   Procedure: BIOPSY;  Surgeon: Legrand Victory LITTIE DOUGLAS, Steven [name];  Location: [facility];  Service: Gastroenterology;;   ESOPHAGOGASTRODUODENOSCOPY  N/A 12/05/2021   Procedure: ESOPHAGOGASTRODUODENOSCOPY (EGD);  Surgeon: Legrand Victory LITTIE DOUGLAS, Steven [name];  Location: [facility];  Service: Gastroenterology;  Laterality: N/A;   HOT HEMOSTASIS N/A 12/05/2021   Procedure: HOT HEMOSTASIS (ARGON PLASMA COAGULATION/BICAP);  Surgeon: Legrand Victory LITTIE DOUGLAS, Steven [name];  Location: [facility];  Service: Gastroenterology;  Laterality: N/A;   INGUINAL HERNIA REPAIR Right 12/19/2017   Procedure: HERNIA REPAIR INGUINAL RIGHT;  Surgeon: Vernetta Berg, Steven [name];  Location: [facility];  Service: General;  Laterality: Right;   INSERTION OF MESH Right 12/19/2017   Procedure: INSERTION OF MESH;  Surgeon: Vernetta Berg, Steven [name];  Location: [facility];  Service: General;  Laterality: Right;   MASS EXCISION Right 12/19/2017   Procedure: EXCISION OF RIGHT SCROTAL MASS;  Surgeon: Vernetta Berg, Steven [name];  Location: [facility];  Service: General;  Laterality: Right;   TONSILLECTOMY     Social History   Socioeconomic History   Marital status: Divorced    Spouse name: Not on file   Number of children: 1   Years of education: 12   Highest education level: 12th grade  Occupational History   Occupation: retired  Tobacco Use   Smoking status: Former    Current packs/day: 2.00    Average packs/day: 2.0 packs/day for 25.0 years (50.0 ttl pk-yrs)    Types: Cigarettes   Smokeless tobacco: Former    Types: Engineer, drilling   Vaping status: Never Used  Substance and Sexual Activity  Alcohol use: Not Currently   Drug use: No   Sexual activity: Yes    Birth control/protection: Condom  Other Topics Concern   Not on file  Social History Narrative   Fun: Just working   Social Drivers of Corporate investment banker Strain: Low Risk  (05/16/2023)   Overall Financial Resource Strain (CARDIA)    Difficulty of Paying Living Expenses: Not very hard  Recent Concern: Financial Resource Strain - Medium Risk (04/07/2023)   Overall Financial Resource Strain (CARDIA)    Difficulty of Paying Living Expenses:  Somewhat hard  Food Insecurity: No Food Insecurity (05/16/2023)   Hunger Vital Sign    Worried About Running Out of Food in the Last Year: Never true    Ran Out of Food in the Last Year: Never true  Recent Concern: Food Insecurity - Food Insecurity Present (04/07/2023)   Hunger Vital Sign    Worried About Running Out of Food in the Last Year: Sometimes true    Ran Out of Food in the Last Year: Sometimes true  Transportation Needs: No Transportation Needs (05/16/2023)   PRAPARE - Administrator, Civil Service (Medical): No    Lack of Transportation (Non-Medical): No  Physical Activity: Insufficiently Active (05/16/2023)   Exercise Vital Sign    Days of Exercise per Week: 2 days    Minutes of Exercise per Session: 10 min  Stress: No Stress Concern Present (05/16/2023)   [employer] of Occupational Health - Occupational Stress Questionnaire    Feeling of Stress : Not at all  Social Connections: Moderately Isolated (05/16/2023)   Social Connection and Isolation Panel    Frequency of Communication with Friends and Family: More than three times a week    Frequency of Social Gatherings with Friends and Family: Once a week    Attends Religious Services: 1 to 4 times per year    Active Member of [employer] or Organizations: No    Attends Engineer, structural: Not on file    Marital Status: Divorced   No Known Allergies Family History  Problem Relation Age of Onset   Heart disease Mother    Hypertension Mother    Diabetes Mother    Stroke Father    Hypertension Father    Stroke Brother     Current Outpatient Medications (Endocrine & Metabolic):    dapagliflozin  propanediol (FARXIGA ) 10 MG TABS tablet, Take 1 tablet (10 mg total) by mouth daily.   Dulaglutide  (TRULICITY ) 1.5 MG/0.5ML SOAJ, Inject 1.5 mg into the skin once a week.   glipiZIDE  (GLUCOTROL  XL) 10 MG 24 hr tablet, Take 1 tablet (10 mg total) by mouth 2 (two) times daily.   HUMULIN  N 100 UNIT/ML  injection, Inject up to 30 units in morning and 40 units at bedtime   insulin  aspart (NOVOLOG  FLEXPEN) 100 UNIT/ML FlexPen, Inject 25 Units into the skin daily before breakfast.   insulin  lispro (HUMALOG  KWIKPEN) 100 UNIT/ML KwikPen, Humalog  22 units after break fast, 18 units after dinner (Patient taking differently: Humalog  22 units after break fast, 16 units after dinner)   metFORMIN  (GLUCOPHAGE ) 1000 MG tablet, TAKE 1 TABLET BY MOUTH TWICE A DAY WITH FOOD  Current Outpatient Medications (Cardiovascular):    hydrochlorothiazide  (HYDRODIURIL ) 25 MG tablet, TAKE 1 TABLET BY MOUTH EVERY DAY   lisinopril  (ZESTRIL ) 5 MG tablet, Take 5 mg by mouth daily.   lovastatin  (MEVACOR ) 40 MG tablet, TAKE 1 TABLET BY MOUTH EVERYDAY AT BEDTIME   Current Outpatient  Medications (Analgesics):    allopurinol  (ZYLOPRIM ) 100 MG tablet, TAKE 1 TABLET BY MOUTH TWICE A DAY   ibuprofen  (ADVIL ) 600 MG tablet, Take 600 mg by mouth every 6 (six) hours as needed.   meloxicam  (MOBIC ) 15 MG tablet, TAKE 1 TABLET BY MOUTH EVERY DAY AS NEEDED FOR PAIN   oxyCODONE  (ROXICODONE ) 5 MG immediate release tablet, Take 1 tablet (5 mg total) by mouth every 4 (four) hours as needed for severe pain (pain score 7-10).   oxyCODONE -acetaminophen  (PERCOCET) 7.5-325 MG tablet, Take 1 tablet by mouth every 6 (six) hours as needed for severe pain (pain score 7-10).   traMADol  (ULTRAM ) 50 MG tablet, Take 1 tablet (50 mg total) by mouth every 6 (six) hours as needed.  Current Outpatient Medications (Hematological):    ferrous sulfate  324 (65 Fe) MG TBEC, Take 1 tablet (325 mg total) by mouth daily.   iron  polysaccharides (NU-IRON ) 150 MG capsule, Take 1 capsule (150 mg total) by mouth daily.  Current Outpatient Medications (Other):    Continuous Glucose Sensor (DEXCOM G7 SENSOR) MISC, 1 Device by Does not apply route as directed. Change sensor every 10 days   diclofenac  Sodium (VOLTAREN ) 1 % GEL, Apply 2 g topically 4 (four) times daily.    gabapentin  (NEURONTIN ) 100 MG capsule, Take 2 capsules (200 mg total) by mouth at bedtime.   Insulin  Pen Needle (RELION PEN NEEDLES) 32G X 4 MM MISC, 1 Needle by Does not apply route 2 (two) times daily.   Insulin  Syringe-Needle U-100 (RELION INSULIN  SYRINGE) 31G X 15/64 0.5 ML MISC, Use to inject insulin  twice a day.   methocarbamol  (ROBAXIN ) 500 MG tablet, Take 1 tablet (500 mg total) by mouth 4 (four) times daily.   Ostomy Supplies (SKIN TAC ADHESIVE BARRIER WIPE) MISC, Use to help apply Dexcom sensor every 10 days   OVER THE COUNTER MEDICATION, Take 1 tablet by mouth 2 (two) times daily. Beet Root supplement   pantoprazole  (PROTONIX ) 40 MG tablet, TAKE 1 TABLET BY MOUTH EVERY DAY   solifenacin  (VESICARE ) 5 MG tablet, TAKE 1 TABLET (5 MG TOTAL) BY MOUTH DAILY.   tamsulosin  (FLOMAX ) 0.4 MG CAPS capsule, TAKE 1 CAPSULE BY MOUTH EVERY DAY   Reviewed prior external information including notes and imaging from  primary care provider As well as notes that were available from care everywhere and other healthcare systems.  Past medical history, social, surgical and family history all reviewed in electronic medical record.  No pertanent information unless stated regarding to the chief complaint.   Review of Systems:  No headache, visual changes, nausea, vomiting, diarrhea, constipation, dizziness, abdominal pain, skin rash, fevers, chills, night sweats, weight loss, swollen lymph nodes, body aches, joint swelling, chest pain, shortness of breath, mood changes. POSITIVE muscle aches  Objective  There were no vitals taken for this visit.   General: No apparent distress alert and oriented x3 mood and affect normal, dressed appropriately.  HEENT: Pupils equal, extraocular movements intact  Respiratory: Patient's speak in full sentences and does not appear short of breath  Cardiovascular: No lower extremity edema, non tender, no erythema  Low back pain seem to be out of proportion.  Limited range of  motion in all planes.  Patient does have tightness with straight leg test especially on the left side with radicular symptoms more in the L2-L3 distribution.  Deep tendon reflexes are intact difficulty going from a seated to standing position.  Pain in the left groin area.  No masses appreciated.  Significant difficulty even with ambulation.  Left hand exam shows that there is a trigger nodule noted at the left middle finger flexor tendon sheath at the A2 pulley.  Procedure: Real-time Ultrasound Guided Injection of left flexor tendon sheath Device: GE Logiq Q7 Ultrasound guided injection is preferred based studies that show increased duration, increased effect, greater accuracy, decreased procedural pain, increased response rate, and decreased cost with ultrasound guided versus blind injection.  Verbal informed consent obtained.  Time-out conducted.  Noted no overlying erythema, induration, or other signs of local infection.  Skin prepped in a sterile fashion.  Local anesthesia: Topical Ethyl chloride.  With sterile technique and under real time ultrasound guidance: With a 25-gauge half inch needle injected with 0.5 cc of 0.5% Marcaine  and 0.5 cc of Kenalog  40 mg/mL Completed without difficulty  Pain immediately resolved suggesting accurate placement of the medication.  Advised to call if fevers/chills, erythema, induration, drainage, or persistent bleeding.  Images saved Impression: Technically successful ultrasound guided injection.    Impression and Recommendations:

## 2023-11-20 ENCOUNTER — Encounter: Payer: Self-pay | Admitting: "Endocrinology

## 2023-11-20 ENCOUNTER — Ambulatory Visit (INDEPENDENT_AMBULATORY_CARE_PROVIDER_SITE_OTHER): Admitting: "Endocrinology

## 2023-11-20 VITALS — BP 130/80 | HR 88 | Ht 68.0 in | Wt 233.0 lb

## 2023-11-20 DIAGNOSIS — Z7985 Long-term (current) use of injectable non-insulin antidiabetic drugs: Secondary | ICD-10-CM

## 2023-11-20 DIAGNOSIS — E782 Mixed hyperlipidemia: Secondary | ICD-10-CM

## 2023-11-20 DIAGNOSIS — E1165 Type 2 diabetes mellitus with hyperglycemia: Secondary | ICD-10-CM

## 2023-11-20 DIAGNOSIS — Z794 Long term (current) use of insulin: Secondary | ICD-10-CM | POA: Diagnosis not present

## 2023-11-20 DIAGNOSIS — Z7984 Long term (current) use of oral hypoglycemic drugs: Secondary | ICD-10-CM

## 2023-11-20 MED ORDER — OXYCODONE-ACETAMINOPHEN 7.5-325 MG PO TABS: 1.0000 | ORAL_TABLET | Freq: Four times a day (QID) | ORAL | 0 refills | Status: DC | PRN

## 2023-11-20 NOTE — Progress Notes (Signed)
 Outpatient Endocrinology Note Steven Birmingham, MD  11/20/23   Steven [name] Mar 28, 1961 [id number]  Referring Provider: Norleen Lynwood ORN, MD Primary Care Provider: Norleen Lynwood ORN, MD Reason for consultation: Subjective   Assessment & Plan  Diagnoses and all orders for this visit:  Uncontrolled type 2 diabetes mellitus with hyperglycemia, with long-term current use of insulin  (HCC)  Long term (current) use of oral hypoglycemic drugs  Long-term (current) use of injectable non-insulin  antidiabetic drugs  Long-term insulin  use (HCC)  Mixed hypercholesterolemia and hypertriglyceridemia    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, neuropathy  Lab Results  Component Value Date   GFR 46.75 (L) 10/24/2023   Hba1c goal less than 7, current Hba1c is  Lab Results  Component Value Date   HGBA1C 8.8 (H) 10/24/2023   Currently on: Trulicity  0.75mg /week->1.5 mg/week  Farxiga  10 mg daily, metformin  1 g twice daily, glipizide  XL 10 mg bid  NPH 27-28 units twice a day (take 15 min before break fast and dinner) Humalog  kwikpen correction scale: Use as needed based on blood sugars as follows:  151 - 175: 1 unit 176 - 200: 2 units 201 - 225: 3 units 226 - 250: 4 units 251 - 275: 5 units 276 - 300: 6 units 301 - 325: 7 units 326 - 350: 8 units 351 - 375: 9 units 376 - 400: 10 units   Goal is to decrease humalog  insulin  once settled on Trulicity   Check BG before meals Ordered DM education and instructed pt to bring all insulins to help with timings of intake as patient doesn't remember names of insulin   Use skin tac to allow dexcom to stay put  No known contraindications to any of above medications Glucagon  discussed and prescribed with refills on 12/04/22  -Last LD and Tg are as follows: Lab Results  Component Value Date   LDLCALC 59 04/09/2023    Lab Results  Component Value Date   TRIG 246.0 (H) 04/09/2023   -on lovastatin  40 mg every day -Follow low fat diet and  exercise   -Blood pressure goal <140/90 - Microalbumin/creatinine goal < 30 -Last MA/Cr is as follows: Lab Results  Component Value Date   MICROALBUR <0.2 10/02/2023   -on ACE/ARB lisinopril  5 mg every day -diet changes including salt restriction -limit eating outside -counseled BP targets per standards of diabetes care -uncontrolled blood pressure can lead to retinopathy, nephropathy and cardiovascular and atherosclerotic heart disease  Reviewed and counseled on: -A1C target -Blood sugar targets -Complications of uncontrolled diabetes  -Checking blood sugar before meals and bedtime and bring log next visit -All medications with mechanism of action and side effects -Hypoglycemia management: rule of 15's, Glucagon  Emergency Kit and medical alert ID -low-carb low-fat plate-method diet -At least 20 minutes of physical activity per day -Annual dilated retinal eye exam and foot exam -compliance and follow up needs -follow up as scheduled or earlier if problem gets worse  Call if blood sugar is less than 70 or consistently above 250    Take a 15 gm snack of carbohydrate at bedtime before you go to sleep if your blood sugar is less than 100.    If you are going to fast after midnight for a test or procedure, ask your physician for instructions on how to reduce/decrease your insulin  dose.    Call if blood sugar is less than 70 or consistently above 250  -Treating a low sugar by rule of 15  (15 gms of sugar every 15  min until sugar is more than 70) If you feel your sugar is low, test your sugar to be sure If your sugar is low (less than 70), then take 15 grams of a fast acting Carbohydrate (3-4 glucose tablets or glucose gel or 4 ounces of juice or regular soda) Recheck your sugar 15 min after treating low to make sure it is more than 70 If sugar is still less than 70, treat again with 15 grams of carbohydrate          Don't drive the hour of hypoglycemia  If unconscious/unable to  eat or drink by mouth, use glucagon  injection or nasal spray baqsimi and call 911. Can repeat again in 15 min if still unconscious.  Return in about 6 weeks (around 01/01/2024).   I have reviewed current medications, nurse's notes, allergies, vital signs, past medical and surgical history, family medical history, and social history for this encounter. Counseled patient on symptoms, examination findings, lab findings, imaging results, treatment decisions and monitoring and prognosis. The patient understood the recommendations and agrees with the treatment plan. All questions regarding treatment plan were fully answered.  Steven Birmingham, MD  11/20/23  History of Present Illness Steven [name] is a 63 y.o. year old male who presents for follow up of Type II diabetes mellitus.  Steven [name] was first diagnosed in 2010.   Diabetes education +  Home diabetes regimen: Farxiga  10 mg daily, metformin  1 g twice daily, glipizide  XL 10 mg bid  NPH 25 units twice a day Humalog  15-20 units after break fast, 10 units after dinner Trulicity  0.75mg /week  Previous history:     Previously on metformin  and started on insulin  in 10/21 He has only been on NPH A1c range in the last few years: 8.1-12.1  COMPLICATIONS -  MI/Stroke -  retinopathy +  neuropathy +  nephropathy  BLOOD SUGAR DATA CGM interpretation: At today's visit, we reviewed her CGM downloads. The full report is scanned in the media. Reviewing the CGM trends, BG are elevated in daytime.   Physical Exam  BP 130/80   Pulse 88   Ht 5' 8 (1.727 m)   Wt 233 lb (105.7 kg)   SpO2 98%   BMI 35.43 kg/m    Constitutional: well developed, well nourished Head: normocephalic, atraumatic Eyes: sclera anicteric, no redness Neck: supple Lungs: normal respiratory effort Neurology: alert and oriented Skin: dry, no appreciable rashes Musculoskeletal: no appreciable defects Psychiatric: normal mood and affect Diabetic Foot Exam - Simple    No data filed      Current Medications Patient's Medications  New Prescriptions   No medications on file  Previous Medications   ALLOPURINOL  (ZYLOPRIM ) 100 MG TABLET    TAKE 1 TABLET BY MOUTH TWICE A DAY   CONTINUOUS GLUCOSE SENSOR (DEXCOM G7 SENSOR) MISC    1 Device by Does not apply route as directed. Change sensor every 10 days   DAPAGLIFLOZIN  PROPANEDIOL (FARXIGA ) 10 MG TABS TABLET    Take 1 tablet (10 mg total) by mouth daily.   DICLOFENAC  SODIUM (VOLTAREN ) 1 % GEL    Apply 2 g topically 4 (four) times daily.   DULAGLUTIDE  (TRULICITY ) 1.5 MG/0.5ML SOAJ    Inject 1.5 mg into the skin once a week.   FERROUS SULFATE  324 (65 FE) MG TBEC    Take 1 tablet (325 mg total) by mouth daily.   GABAPENTIN  (NEURONTIN ) 100 MG CAPSULE    Take 2 capsules (200 mg total) by mouth at bedtime.  GLIPIZIDE  (GLUCOTROL  XL) 10 MG 24 HR TABLET    Take 1 tablet (10 mg total) by mouth 2 (two) times daily.   HUMULIN  N 100 UNIT/ML INJECTION    Inject up to 30 units in morning and 40 units at bedtime   HYDROCHLOROTHIAZIDE  (HYDRODIURIL ) 25 MG TABLET    TAKE 1 TABLET BY MOUTH EVERY DAY   IBUPROFEN  (ADVIL ) 600 MG TABLET    Take 600 mg by mouth every 6 (six) hours as needed.   INSULIN  ASPART (NOVOLOG  FLEXPEN) 100 UNIT/ML FLEXPEN    Inject 25 Units into the skin daily before breakfast.   INSULIN  LISPRO (HUMALOG  KWIKPEN) 100 UNIT/ML KWIKPEN    Humalog  22 units after break fast, 18 units after dinner   INSULIN  PEN NEEDLE (RELION PEN NEEDLES) 32G X 4 MM MISC    1 Needle by Does not apply route 2 (two) times daily.   INSULIN  SYRINGE-NEEDLE U-100 (RELION INSULIN  SYRINGE) 31G X 15/64 0.5 ML MISC    Use to inject insulin  twice a day.   IRON  POLYSACCHARIDES (NU-IRON ) 150 MG CAPSULE    Take 1 capsule (150 mg total) by mouth daily.   LISINOPRIL  (ZESTRIL ) 5 MG TABLET    Take 5 mg by mouth daily.   LOVASTATIN  (MEVACOR ) 40 MG TABLET    TAKE 1 TABLET BY MOUTH EVERYDAY AT BEDTIME   MELOXICAM  (MOBIC ) 15 MG TABLET    TAKE 1 TABLET BY  MOUTH EVERY DAY AS NEEDED FOR PAIN   METFORMIN  (GLUCOPHAGE ) 1000 MG TABLET    TAKE 1 TABLET BY MOUTH TWICE A DAY WITH FOOD   METHOCARBAMOL  (ROBAXIN ) 500 MG TABLET    Take 1 tablet (500 mg total) by mouth 4 (four) times daily.   OSTOMY SUPPLIES (SKIN TAC ADHESIVE BARRIER WIPE) MISC    Use to help apply Dexcom sensor every 10 days   OVER THE COUNTER MEDICATION    Take 1 tablet by mouth 2 (two) times daily. Beet Root supplement   OXYCODONE  (ROXICODONE ) 5 MG IMMEDIATE RELEASE TABLET    Take 1 tablet (5 mg total) by mouth every 4 (four) hours as needed for severe pain (pain score 7-10).   OXYCODONE -ACETAMINOPHEN  (PERCOCET) 7.5-325 MG TABLET    Take 1 tablet by mouth every 6 (six) hours as needed for severe pain (pain score 7-10).   PANTOPRAZOLE  (PROTONIX ) 40 MG TABLET    TAKE 1 TABLET BY MOUTH EVERY DAY   SOLIFENACIN  (VESICARE ) 5 MG TABLET    TAKE 1 TABLET (5 MG TOTAL) BY MOUTH DAILY.   TAMSULOSIN  (FLOMAX ) 0.4 MG CAPS CAPSULE    TAKE 1 CAPSULE BY MOUTH EVERY DAY   TRAMADOL  (ULTRAM ) 50 MG TABLET    Take 1 tablet (50 mg total) by mouth every 6 (six) hours as needed.  Modified Medications   No medications on file  Discontinued Medications   No medications on file    Allergies No Known Allergies  Past Medical History Past Medical History:  Diagnosis Date   Chicken pox    Diabetes mellitus without complication (HCC)    Essential hypertension    GERD (gastroesophageal reflux disease)    Gouty arthritis    Hypertension    Iron  deficiency anemia    Vitamin D  deficiency     Past Surgical History Past Surgical History:  Procedure Laterality Date   BIOPSY  12/05/2021   Procedure: BIOPSY;  Surgeon: Legrand Victory LITTIE DOUGLAS, MD;  Location: [facility];  Service: Gastroenterology;;   ESOPHAGOGASTRODUODENOSCOPY N/A 12/05/2021   Procedure:  ESOPHAGOGASTRODUODENOSCOPY (EGD);  Surgeon: Legrand Victory LITTIE DOUGLAS, MD;  Location: [facility];  Service: Gastroenterology;  Laterality: N/A;   HOT HEMOSTASIS N/A  12/05/2021   Procedure: HOT HEMOSTASIS (ARGON PLASMA COAGULATION/BICAP);  Surgeon: Legrand Victory LITTIE DOUGLAS, MD;  Location: [facility];  Service: Gastroenterology;  Laterality: N/A;   INGUINAL HERNIA REPAIR Right 12/19/2017   Procedure: HERNIA REPAIR INGUINAL RIGHT;  Surgeon: Vernetta Berg, MD;  Location: [facility];  Service: General;  Laterality: Right;   INSERTION OF MESH Right 12/19/2017   Procedure: INSERTION OF MESH;  Surgeon: Vernetta Berg, MD;  Location: [facility];  Service: General;  Laterality: Right;   MASS EXCISION Right 12/19/2017   Procedure: EXCISION OF RIGHT SCROTAL MASS;  Surgeon: Vernetta Berg, MD;  Location: [facility];  Service: General;  Laterality: Right;   TONSILLECTOMY      Family History family history includes Diabetes in his mother; Heart disease in his mother; Hypertension in his father and mother; Stroke in his brother and father.  Social History Social History   Socioeconomic History   Marital status: Divorced    Spouse name: Not on file   Number of children: 1   Years of education: 12   Highest education level: 12th grade  Occupational History   Occupation: retired  Tobacco Use   Smoking status: Former    Current packs/day: 2.00    Average packs/day: 2.0 packs/day for 25.0 years (50.0 ttl pk-yrs)    Types: Cigarettes   Smokeless tobacco: Former    Types: Engineer, drilling   Vaping status: Never Used  Substance and Sexual Activity   Alcohol use: Not Currently   Drug use: No   Sexual activity: Yes    Birth control/protection: Condom  Other Topics Concern   Not on file  Social History Narrative   Fun: Just working   Social Drivers of Corporate investment banker Strain: Low Risk  (05/16/2023)   Overall Financial Resource Strain (CARDIA)    Difficulty of Paying Living Expenses: Not very hard  Recent Concern: Financial Resource Strain - Medium Risk (04/07/2023)   Overall Financial Resource Strain (CARDIA)    Difficulty of Paying Living Expenses:  Somewhat hard  Food Insecurity: No Food Insecurity (05/16/2023)   Hunger Vital Sign    Worried About Running Out of Food in the Last Year: Never true    Ran Out of Food in the Last Year: Never true  Recent Concern: Food Insecurity - Food Insecurity Present (04/07/2023)   Hunger Vital Sign    Worried About Running Out of Food in the Last Year: Sometimes true    Ran Out of Food in the Last Year: Sometimes true  Transportation Needs: No Transportation Needs (05/16/2023)   PRAPARE - Administrator, Civil Service (Medical): No    Lack of Transportation (Non-Medical): No  Physical Activity: Insufficiently Active (05/16/2023)   Exercise Vital Sign    Days of Exercise per Week: 2 days    Minutes of Exercise per Session: 10 min  Stress: No Stress Concern Present (05/16/2023)   [employer] of Occupational Health - Occupational Stress Questionnaire    Feeling of Stress : Not at all  Social Connections: Moderately Isolated (05/16/2023)   Social Connection and Isolation Panel    Frequency of Communication with Friends and Family: More than three times a week    Frequency of Social Gatherings with Friends and Family: Once a week    Attends Religious Services: 1 to 4 times per  year    Active Member of Clubs or Organizations: No    Attends Banker Meetings: Not on file    Marital Status: Divorced  Intimate Partner Violence: Not on file    Lab Results  Component Value Date   HGBA1C 8.8 (H) 10/24/2023   HGBA1C 7.6 (A) 09/03/2023   HGBA1C 8.6 (H) 04/09/2023   Lab Results  Component Value Date   CHOL 140 04/09/2023   Lab Results  Component Value Date   HDL 32.00 (L) 04/09/2023   Lab Results  Component Value Date   LDLCALC 59 04/09/2023   Lab Results  Component Value Date   TRIG 246.0 (H) 04/09/2023   Lab Results  Component Value Date   CHOLHDL 4 04/09/2023   Lab Results  Component Value Date   CREATININE 1.55 (H) 10/24/2023   Lab Results   Component Value Date   GFR 46.75 (L) 10/24/2023   Lab Results  Component Value Date   MICROALBUR <0.2 10/02/2023      Component Value Date/Time   NA 126 (L) 10/24/2023 1452   K 4.4 10/24/2023 1452   CL 89 (L) 10/24/2023 1452   CO2 27 10/24/2023 1452   GLUCOSE 207 (H) 10/24/2023 1452   BUN 33 (H) 10/24/2023 1452   CREATININE 1.55 (H) 10/24/2023 1452   CALCIUM 9.8 10/24/2023 1452   PROT 8.3 10/24/2023 1452   ALBUMIN 4.5 10/24/2023 1452   AST 14 10/24/2023 1452   ALT 28 10/24/2023 1452   ALKPHOS 90 10/24/2023 1452   BILITOT 0.7 10/24/2023 1452   GFRNONAA 54 (L) 10/18/2023 1930   GFRAA >60 01/30/2018 2037      Latest Ref Rng & Units 10/24/2023    2:52 PM 10/18/2023    7:30 PM 06/04/2023    4:03 PM  BMP  Glucose 70 - 99 mg/dL 792  857  86   BUN 6 - 23 mg/dL 33  21  35   Creatinine 0.40 - 1.50 mg/dL 8.44  8.55  8.73   Sodium 135 - 145 mEq/L 126  135  133   Potassium 3.5 - 5.1 mEq/L 4.4  4.6  4.0   Chloride 96 - 112 mEq/L 89  100  100   CO2 19 - 32 mEq/L 27  23  22    Calcium 8.4 - 10.5 mg/dL 9.8  9.9  9.7        Component Value Date/Time   WBC 10.1 10/24/2023 1452   RBC 5.54 10/24/2023 1452   HGB 13.9 10/24/2023 1452   HCT 43.4 10/24/2023 1452   PLT 416.0 (H) 10/24/2023 1452   MCV 78.4 10/24/2023 1452   MCH 25.0 (L) 10/18/2023 1930   MCHC 32.1 10/24/2023 1452   RDW 17.9 (H) 10/24/2023 1452   LYMPHSABS 2.3 10/24/2023 1452   MONOABS 1.0 10/24/2023 1452   EOSABS 0.3 10/24/2023 1452   BASOSABS 0.0 10/24/2023 1452     Parts of this note may have been dictated using voice recognition software. There may be variances in spelling and vocabulary which are unintentional. Not all errors are proofread. Please notify the dino if any discrepancies are noted or if the meaning of any statement is not clear.

## 2023-11-20 NOTE — Telephone Encounter (Signed)
 Done erx

## 2023-11-20 NOTE — Telephone Encounter (Signed)
 Copied from CRM [id number]. Topic: Clinical - Prescription Issue >> Nov 20, 2023  2:08 PM Armenia J wrote: Reason for CRM: Patient's oxyCODONE -acetaminophen  (PERCOCET) 7.5-325 MG and methocarbamol  (ROBAXIN ) 500 MG were sent to the incorrect pharmacy. The patient is needing to switch the pharmacy to:  [facility] - [place], [place] - [address] [place] [address] Phone: [phone] Fax: [phone] Hours: Not open 24 hours

## 2023-11-20 NOTE — Patient Instructions (Signed)
 Trulicity  0.75mg /week->1.5 mg/week  Farxiga  10 mg daily, metformin  1 g twice daily, glipizide  XL 10 mg bid  NPH 27-28 units twice a day (take 15 min before break fast and dinner) Humalog  kwikpen correction scale: Use as needed based on blood sugars as follows:  151 - 175: 1 unit 176 - 200: 2 units 201 - 225: 3 units 226 - 250: 4 units 251 - 275: 5 units 276 - 300: 6 units 301 - 325: 7 units 326 - 350: 8 units 351 - 375: 9 units 376 - 400: 10 units

## 2023-11-21 ENCOUNTER — Other Ambulatory Visit: Payer: Self-pay

## 2023-11-23 ENCOUNTER — Other Ambulatory Visit: Payer: Self-pay

## 2023-11-23 ENCOUNTER — Telehealth: Payer: Self-pay | Admitting: Internal Medicine

## 2023-11-23 MED ORDER — METHOCARBAMOL 500 MG PO TABS: 500.0000 mg | ORAL_TABLET | Freq: Four times a day (QID) | ORAL | 2 refills | Status: DC

## 2023-11-23 NOTE — Telephone Encounter (Signed)
Prescription has been sent to correct pharmacy.

## 2023-11-23 NOTE — Telephone Encounter (Signed)
 Copied from CRM [id number]. Topic: Clinical - Prescription Issue >> Nov 23, 2023 11:12 AM Chasity T wrote: Reason for CRM: Patient is calling in because the prescription methocarbamol  (ROBAXIN ) 500 MG tablet was sent to the wrong pharmacy and has been [facility] but hasn't. Please send to correct pharmacy  [facility] [place], [place] - [address] [place] [address] Phone: [phone] Fax: [phone] Hours: Not open 24 hours

## 2023-11-26 ENCOUNTER — Ambulatory Visit (INDEPENDENT_AMBULATORY_CARE_PROVIDER_SITE_OTHER)

## 2023-11-26 ENCOUNTER — Other Ambulatory Visit: Payer: Self-pay

## 2023-11-26 ENCOUNTER — Telehealth: Payer: Self-pay | Admitting: Internal Medicine

## 2023-11-26 ENCOUNTER — Ambulatory Visit (INDEPENDENT_AMBULATORY_CARE_PROVIDER_SITE_OTHER): Admitting: Family Medicine

## 2023-11-26 VITALS — BP 118/72 | HR 72 | Ht 68.0 in | Wt 232.0 lb

## 2023-11-26 DIAGNOSIS — R102 Pelvic and perineal pain: Secondary | ICD-10-CM | POA: Diagnosis not present

## 2023-11-26 DIAGNOSIS — M542 Cervicalgia: Secondary | ICD-10-CM | POA: Diagnosis not present

## 2023-11-26 DIAGNOSIS — M419 Scoliosis, unspecified: Secondary | ICD-10-CM | POA: Diagnosis not present

## 2023-11-26 DIAGNOSIS — M545 Low back pain, unspecified: Secondary | ICD-10-CM

## 2023-11-26 DIAGNOSIS — M79642 Pain in left hand: Secondary | ICD-10-CM | POA: Diagnosis not present

## 2023-11-26 DIAGNOSIS — M109 Gout, unspecified: Secondary | ICD-10-CM

## 2023-11-26 DIAGNOSIS — M47812 Spondylosis without myelopathy or radiculopathy, cervical region: Secondary | ICD-10-CM | POA: Diagnosis not present

## 2023-11-26 DIAGNOSIS — M4802 Spinal stenosis, cervical region: Secondary | ICD-10-CM | POA: Diagnosis not present

## 2023-11-26 DIAGNOSIS — M5416 Radiculopathy, lumbar region: Secondary | ICD-10-CM | POA: Diagnosis not present

## 2023-11-26 DIAGNOSIS — M79641 Pain in right hand: Secondary | ICD-10-CM

## 2023-11-26 DIAGNOSIS — M160 Bilateral primary osteoarthritis of hip: Secondary | ICD-10-CM | POA: Diagnosis not present

## 2023-11-26 DIAGNOSIS — M65332 Trigger finger, left middle finger: Secondary | ICD-10-CM | POA: Diagnosis not present

## 2023-11-26 MED ORDER — PANTOPRAZOLE SODIUM 40 MG PO TBEC: 40.0000 mg | DELAYED_RELEASE_TABLET | Freq: Every day | ORAL | 6 refills | Status: AC

## 2023-11-26 MED ORDER — ALLOPURINOL 100 MG PO TABS: 100.0000 mg | ORAL_TABLET | Freq: Two times a day (BID) | ORAL | 3 refills | Status: AC

## 2023-11-26 NOTE — Patient Instructions (Addendum)
 Xray today Good to see you! [facility] [phone] Call Today  When we receive your results we will contact you.  See you again in 3 months

## 2023-11-26 NOTE — Telephone Encounter (Unsigned)
 Copied from CRM [id number]. Topic: Clinical - Medication Refill >> Nov 26, 2023  1:28 PM Suzen RAMAN wrote: Medication: allopurinol  (ZYLOPRIM ) 100 MG tablet pantoprazole  (PROTONIX ) 40 MG tablet  Has the patient contacted their pharmacy? Yes  This is the patient's preferred pharmacy:  [facility] [place], [place] - [address] [place] [address] Phone: [phone] Fax: [phone]  Is this the correct pharmacy for this prescription? Yes If no, delete pharmacy and type the correct one.   Has the prescription been filled recently? No  Is the patient out of the medication? Yes  Has the patient been seen for an appointment in the last year OR does the patient have an upcoming appointment? Yes  Can we respond through MyChart? Yes  Agent: Please be advised that Rx refills may take up to 3 business days. We ask that you follow-up with your pharmacy.

## 2023-11-27 ENCOUNTER — Encounter: Payer: Self-pay | Admitting: Family Medicine

## 2023-11-27 DIAGNOSIS — M5416 Radiculopathy, lumbar region: Secondary | ICD-10-CM | POA: Insufficient documentation

## 2023-11-27 DIAGNOSIS — M65332 Trigger finger, left middle finger: Secondary | ICD-10-CM | POA: Insufficient documentation

## 2023-11-27 NOTE — Assessment & Plan Note (Signed)
 Given injection and tolerated the procedure well, discussed icing regimen of home exercises, discussed which activities to do and which ones to avoid.  Follow-up again 2 to 3 months

## 2023-11-27 NOTE — Assessment & Plan Note (Signed)
 I believe more acute on chronic.  Likely does have some underlying osteoarthritic changes that is contributing to potentially nerve root impingement.  We discussed icing regimen and home exercises, discussed which activities to do and which ones to avoid.  Increasing activity slowly.  Patient has failed all other conservative therapy and pain in the left testicle is concerning for more radicular symptoms with further workup in the emergency room being unremarkable.  Concerned that this is more radicular symptoms and causing difficulty with sleep, and activities of daily living.  Discussed with patient about icing regimen of home exercises, discussed which activities to do and which ones to avoid otherwise.  Increase activity slowly.  Discussed icing regimen.  Follow-up with me again after imaging of the MRI of the lumbar spine.  Could be candidate for potential injections such as epidurals or nerve root injections.

## 2023-11-28 ENCOUNTER — Telehealth: Payer: Self-pay | Admitting: Internal Medicine

## 2023-11-28 NOTE — Telephone Encounter (Signed)
 Copied from CRM [id number]. Topic: Clinical - Prescription Issue >> Nov 28, 2023  3:03 PM Deaijah H wrote: Reason for CRM: Patient called in stating the pharmacy is stating they did not receive methocarbamol  (ROBAXIN ) 500 MG tablet.

## 2023-11-29 ENCOUNTER — Other Ambulatory Visit: Payer: Self-pay

## 2023-11-29 MED ORDER — METHOCARBAMOL 500 MG PO TABS: 500.0000 mg | ORAL_TABLET | Freq: Four times a day (QID) | ORAL | 2 refills | Status: DC

## 2023-11-29 NOTE — Telephone Encounter (Signed)
Prescription has been resent

## 2023-12-02 ENCOUNTER — Ambulatory Visit: Payer: Self-pay | Admitting: Family Medicine

## 2023-12-04 ENCOUNTER — Encounter: Payer: Self-pay | Admitting: "Endocrinology

## 2023-12-04 ENCOUNTER — Ambulatory Visit (INDEPENDENT_AMBULATORY_CARE_PROVIDER_SITE_OTHER): Admitting: "Endocrinology

## 2023-12-04 VITALS — BP 118/70 | HR 65 | Ht 68.0 in | Wt 234.0 lb

## 2023-12-04 DIAGNOSIS — E1165 Type 2 diabetes mellitus with hyperglycemia: Secondary | ICD-10-CM

## 2023-12-04 DIAGNOSIS — Z7984 Long term (current) use of oral hypoglycemic drugs: Secondary | ICD-10-CM

## 2023-12-04 DIAGNOSIS — Z7985 Long-term (current) use of injectable non-insulin antidiabetic drugs: Secondary | ICD-10-CM

## 2023-12-04 DIAGNOSIS — Z794 Long term (current) use of insulin: Secondary | ICD-10-CM | POA: Diagnosis not present

## 2023-12-04 DIAGNOSIS — E782 Mixed hyperlipidemia: Secondary | ICD-10-CM

## 2023-12-04 NOTE — Patient Instructions (Signed)
 Currently on: Trulicity  0.75mg /week->1.5 mg/week  Farxiga  10 mg daily, metformin  1 g twice daily, glipizide  XL 10 mg bid  NPH 34 units before coffee and 6 am and 32 untis at night a day (take 15 min before break fast and dinner) Humalog  kwikpen correction scale: Use as needed based on blood sugars as follows: 151 - 175: 1 unit 176 - 200: 2 units 201 - 225: 3 units 226 - 250: 4 units 251 - 275: 5 units 276 - 300: 6 units 301 - 325: 7 units 326 - 350: 8 units 351 - 375: 9 units 376 - 400: 10 units

## 2023-12-04 NOTE — Progress Notes (Addendum)
 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, neuropathy  Lab Results  Component Value Date   GFR 46.75 (L) 10/24/2023   Hba1c goal less than 7, current Hba1c is  Lab Results  Component Value Date   HGBA1C 8.8 (H) 10/24/2023   Currently on: Trulicity  0.75mg /week->1.5 mg/week  Farxiga  10 mg daily, metformin  1 g twice daily, glipizide  XL 10 mg bid  NPH 34 units before coffee and 6 am and 32 units at night a day (take 15 min before break fast and dinner) Humalog  kwikpen correction scale: Use as needed based on blood sugars as follows: 151 - 175: 1 unit 176 - 200: 2 units 201 - 225: 3 units 226 - 250: 4 units 251 - 275: 5 units 276 - 300: 6 units 301 - 325: 7 units 326 - 350: 8 units 351 - 375: 9 units 376 - 400: 10 units   Goal is to decrease humalog  insulin  once settled on Trulicity   Check BG before meals Ordered DM education and instructed pt to bring all insulins to help with timings of intake as patient doesn't remember names of insulin   Use skin tac to allow dexcom to stay put  No known contraindications to any of above medications Glucagon  discussed and prescribed with refills on 12/04/22  -Last LD and Tg are as follows: Lab Results  Component Value Date   LDLCALC 59 04/09/2023    Lab Results  Component Value Date   TRIG 246.0 (H) 04/09/2023   -on lovastatin  40 mg  every day -Follow low fat diet and exercise   -Blood pressure goal <140/90 - Microalbumin/creatinine goal < 30 -Last MA/Cr is as follows: Lab Results  Component Value Date   MICROALBUR <0.2 10/02/2023   -on ACE/ARB lisinopril  5 mg every day -diet changes including salt restriction -limit eating outside -counseled BP targets per standards of diabetes care -uncontrolled blood pressure can lead to retinopathy, nephropathy and cardiovascular and atherosclerotic heart disease  Reviewed and counseled on: -A1C target -Blood sugar targets -Complications of uncontrolled diabetes  -Checking blood sugar before meals and bedtime and bring log next visit -All medications with mechanism of action and side effects -Hypoglycemia management: rule of 15's, Glucagon  Emergency Kit and medical alert ID -low-carb low-fat plate-method diet -At least 20 minutes of physical activity per day -Annual dilated retinal eye exam and foot exam -compliance and follow up needs -follow up as scheduled or earlier if problem gets worse  Call if blood sugar is less than 70 or consistently above 250    Take a 15 gm snack of carbohydrate at bedtime before you go to sleep if your blood sugar is less than 100.    If you are going to fast after midnight for a test or procedure, ask your physician for instructions on how to reduce/decrease your insulin  dose.    Call if blood sugar is less than 70 or consistently above 250  -Treating a low sugar by rule  of 15  (15 gms of sugar every 15 min until sugar is more than 70) If you feel your sugar is low, test your sugar to be sure If your sugar is low (less than 70), then take 15 grams of a fast acting Carbohydrate (3-4 glucose tablets or glucose gel or 4 ounces of juice or regular soda) Recheck your sugar 15 min after treating low to make sure it is more than 70 If sugar is still less than 70, treat again with 15 grams of carbohydrate          Don't drive the hour of  hypoglycemia  If unconscious/unable to eat or drink by mouth, use glucagon  injection or nasal spray baqsimi and call 911. Can repeat again in 15 min if still unconscious.  Return in about 4 weeks (around 01/01/2024).   I have reviewed current medications, nurse's notes, allergies, vital signs, past medical and surgical history, family medical history, and social history for this encounter. Counseled patient on symptoms, examination findings, lab findings, imaging results, treatment decisions and monitoring and prognosis. The patient understood the recommendations and agrees with the treatment plan. All questions regarding treatment plan were fully answered.  Steven Birmingham, MD  12/04/23  History of Present Illness Steven [name] is a 63 y.o. year old male who presents for follow up of Type II diabetes mellitus.  Steven [name] was first diagnosed in 2010.   Diabetes education +  Home diabetes regimen: Farxiga  10 mg daily, metformin  1 g twice daily, glipizide  XL 10 mg bid  NPH 30 units twice a day Humalog  15-20 units after break fast, 10 units after dinner Trulicity  0.75mg /week  Previous history:     Previously on metformin  and started on insulin  in 10/21 He has only been on NPH A1c range in the last few years: 8.1-12.1  COMPLICATIONS -  MI/Stroke -  retinopathy +  neuropathy +  nephropathy  BLOOD SUGAR DATA CGM interpretation: At today's visit, we reviewed her CGM downloads. The full report is scanned in the media. Reviewing the CGM trends, BG are elevated in daytime, morning >evening, and improves overnight.   Physical Exam  BP 118/70   Pulse 65   Ht 5' 8 (1.727 m)   Wt 234 lb (106.1 kg)   SpO2 (!) 65%   BMI 35.58 kg/m    Constitutional: well developed, well nourished Head: normocephalic, atraumatic Eyes: sclera anicteric, no redness Neck: supple Lungs: normal respiratory effort Neurology: alert and oriented Skin: dry, no appreciable rashes Musculoskeletal: no  appreciable defects Psychiatric: normal mood and affect Diabetic Foot Exam - Simple   No data filed      Current Medications Patient's Medications  New Prescriptions   No medications on file  Previous Medications   ALLOPURINOL  (ZYLOPRIM ) 100 MG TABLET    Take 1 tablet (100 mg total) by mouth 2 (two) times daily.   CONTINUOUS GLUCOSE SENSOR (DEXCOM G7 SENSOR) MISC    1 Device by Does not apply route as directed. Change sensor every 10 days   DAPAGLIFLOZIN  PROPANEDIOL (FARXIGA ) 10 MG TABS TABLET    Take 1 tablet (10 mg total) by mouth daily.   DICLOFENAC  SODIUM (VOLTAREN ) 1 % GEL    Apply 2 g topically 4 (four) times daily.   DULAGLUTIDE  (TRULICITY ) 1.5 MG/0.5ML SOAJ    Inject 1.5 mg into the skin once a week.   FERROUS SULFATE  324 (65 FE) MG TBEC    Take 1 tablet (325 mg total) by mouth daily.  GABAPENTIN  (NEURONTIN ) 100 MG CAPSULE    Take 2 capsules (200 mg total) by mouth at bedtime.   GLIPIZIDE  (GLUCOTROL  XL) 10 MG 24 HR TABLET    Take 1 tablet (10 mg total) by mouth 2 (two) times daily.   HUMULIN  N 100 UNIT/ML INJECTION    Inject up to 30 units in morning and 40 units at bedtime   HYDROCHLOROTHIAZIDE  (HYDRODIURIL ) 25 MG TABLET    TAKE 1 TABLET BY MOUTH EVERY DAY   IBUPROFEN  (ADVIL ) 600 MG TABLET    Take 600 mg by mouth every 6 (six) hours as needed.   INSULIN  ASPART (NOVOLOG  FLEXPEN) 100 UNIT/ML FLEXPEN    Inject 25 Units into the skin daily before breakfast.   INSULIN  LISPRO (HUMALOG  KWIKPEN) 100 UNIT/ML KWIKPEN    Humalog  22 units after break fast, 18 units after dinner   INSULIN  PEN NEEDLE (RELION PEN NEEDLES) 32G X 4 MM MISC    1 Needle by Does not apply route 2 (two) times daily.   INSULIN  SYRINGE-NEEDLE U-100 (RELION INSULIN  SYRINGE) 31G X 15/64 0.5 ML MISC    Use to inject insulin  twice a day.   IRON  POLYSACCHARIDES (NU-IRON ) 150 MG CAPSULE    Take 1 capsule (150 mg total) by mouth daily.   LISINOPRIL  (ZESTRIL ) 5 MG TABLET    Take 5 mg by mouth daily.   LOVASTATIN  (MEVACOR )  40 MG TABLET    TAKE 1 TABLET BY MOUTH EVERYDAY AT BEDTIME   MELOXICAM  (MOBIC ) 15 MG TABLET    TAKE 1 TABLET BY MOUTH EVERY DAY AS NEEDED FOR PAIN   METFORMIN  (GLUCOPHAGE ) 1000 MG TABLET    TAKE 1 TABLET BY MOUTH TWICE A DAY WITH FOOD   METHOCARBAMOL  (ROBAXIN ) 500 MG TABLET    Take 1 tablet (500 mg total) by mouth 4 (four) times daily.   OSTOMY SUPPLIES (SKIN TAC ADHESIVE BARRIER WIPE) MISC    Use to help apply Dexcom sensor every 10 days   OVER THE COUNTER MEDICATION    Take 1 tablet by mouth 2 (two) times daily. Beet Root supplement   OXYCODONE  (ROXICODONE ) 5 MG IMMEDIATE RELEASE TABLET    Take 1 tablet (5 mg total) by mouth every 4 (four) hours as needed for severe pain (pain score 7-10).   OXYCODONE -ACETAMINOPHEN  (PERCOCET) 7.5-325 MG TABLET    Take 1 tablet by mouth every 6 (six) hours as needed for severe pain (pain score 7-10).   PANTOPRAZOLE  (PROTONIX ) 40 MG TABLET    Take 1 tablet (40 mg total) by mouth daily.   SOLIFENACIN  (VESICARE ) 5 MG TABLET    TAKE 1 TABLET (5 MG TOTAL) BY MOUTH DAILY.   TAMSULOSIN  (FLOMAX ) 0.4 MG CAPS CAPSULE    TAKE 1 CAPSULE BY MOUTH EVERY DAY   TRAMADOL  (ULTRAM ) 50 MG TABLET    Take 1 tablet (50 mg total) by mouth every 6 (six) hours as needed.  Modified Medications   No medications on file  Discontinued Medications   No medications on file    Allergies No Known Allergies  Past Medical History Past Medical History:  Diagnosis Date   Chicken pox    Diabetes mellitus without complication (HCC)    Essential hypertension    GERD (gastroesophageal reflux disease)    Gouty arthritis    Hypertension    Iron  deficiency anemia    Vitamin D  deficiency     Past Surgical History Past Surgical History:  Procedure Laterality Date   BIOPSY  12/05/2021   Procedure: BIOPSY;  Surgeon: Legrand Victory LITTIE DOUGLAS, MD;  Location: [facility];  Service: Gastroenterology;;   ESOPHAGOGASTRODUODENOSCOPY N/A 12/05/2021   Procedure: ESOPHAGOGASTRODUODENOSCOPY (EGD);  Surgeon:  Legrand Victory LITTIE DOUGLAS, MD;  Location: [facility];  Service: Gastroenterology;  Laterality: N/A;   HOT HEMOSTASIS N/A 12/05/2021   Procedure: HOT HEMOSTASIS (ARGON PLASMA COAGULATION/BICAP);  Surgeon: Legrand Victory LITTIE DOUGLAS, MD;  Location: [facility];  Service: Gastroenterology;  Laterality: N/A;   INGUINAL HERNIA REPAIR Right 12/19/2017   Procedure: HERNIA REPAIR INGUINAL RIGHT;  Surgeon: Vernetta Berg, MD;  Location: [facility];  Service: General;  Laterality: Right;   INSERTION OF MESH Right 12/19/2017   Procedure: INSERTION OF MESH;  Surgeon: Vernetta Berg, MD;  Location: [facility];  Service: General;  Laterality: Right;   MASS EXCISION Right 12/19/2017   Procedure: EXCISION OF RIGHT SCROTAL MASS;  Surgeon: Vernetta Berg, MD;  Location: [facility];  Service: General;  Laterality: Right;   TONSILLECTOMY      Family History family history includes Diabetes in his mother; Heart disease in his mother; Hypertension in his father and mother; Stroke in his brother and father.  Social History Social History   Socioeconomic History   Marital status: Divorced    Spouse name: Not on file   Number of children: 1   Years of education: 12   Highest education level: 12th grade  Occupational History   Occupation: retired  Tobacco Use   Smoking status: Former    Current packs/day: 2.00    Average packs/day: 2.0 packs/day for 25.0 years (50.0 ttl pk-yrs)    Types: Cigarettes   Smokeless tobacco: Former    Types: Engineer, drilling   Vaping status: Never Used  Substance and Sexual Activity   Alcohol use: Not Currently   Drug use: No   Sexual activity: Yes    Birth control/protection: Condom  Other Topics Concern   Not on file  Social History Narrative   Fun: Just working   Social Drivers of Corporate investment banker Strain: Low Risk  (05/16/2023)   Overall Financial Resource Strain (CARDIA)    Difficulty of Paying Living Expenses: Not very hard  Recent Concern: Financial Resource Strain -  Medium Risk (04/07/2023)   Overall Financial Resource Strain (CARDIA)    Difficulty of Paying Living Expenses: Somewhat hard  Food Insecurity: No Food Insecurity (05/16/2023)   Hunger Vital Sign    Worried About Running Out of Food in the Last Year: Never true    Ran Out of Food in the Last Year: Never true  Recent Concern: Food Insecurity - Food Insecurity Present (04/07/2023)   Hunger Vital Sign    Worried About Running Out of Food in the Last Year: Sometimes true    Ran Out of Food in the Last Year: Sometimes true  Transportation Needs: No Transportation Needs (05/16/2023)   PRAPARE - Administrator, Civil Service (Medical): No    Lack of Transportation (Non-Medical): No  Physical Activity: Insufficiently Active (05/16/2023)   Exercise Vital Sign    Days of Exercise per Week: 2 days    Minutes of Exercise per Session: 10 min  Stress: No Stress Concern Present (05/16/2023)   [employer] of Occupational Health - Occupational Stress Questionnaire    Feeling of Stress : Not at all  Social Connections: Moderately Isolated (05/16/2023)   Social Connection and Isolation Panel    Frequency of Communication with Friends and Family: More than three times a week    Frequency  of Social Gatherings with Friends and Family: Once a week    Attends Religious Services: 1 to 4 times per year    Active Member of Clubs or Organizations: No    Attends Engineer, structural: Not on file    Marital Status: Divorced  Intimate Partner Violence: Not on file    Lab Results  Component Value Date   HGBA1C 8.8 (H) 10/24/2023   HGBA1C 7.6 (A) 09/03/2023   HGBA1C 8.6 (H) 04/09/2023   Lab Results  Component Value Date   CHOL 140 04/09/2023   Lab Results  Component Value Date   HDL 32.00 (L) 04/09/2023   Lab Results  Component Value Date   LDLCALC 59 04/09/2023   Lab Results  Component Value Date   TRIG 246.0 (H) 04/09/2023   Lab Results  Component Value Date   CHOLHDL  4 04/09/2023   Lab Results  Component Value Date   CREATININE 1.55 (H) 10/24/2023   Lab Results  Component Value Date   GFR 46.75 (L) 10/24/2023   Lab Results  Component Value Date   MICROALBUR <0.2 10/02/2023      Component Value Date/Time   NA 126 (L) 10/24/2023 1452   K 4.4 10/24/2023 1452   CL 89 (L) 10/24/2023 1452   CO2 27 10/24/2023 1452   GLUCOSE 207 (H) 10/24/2023 1452   BUN 33 (H) 10/24/2023 1452   CREATININE 1.55 (H) 10/24/2023 1452   CALCIUM 9.8 10/24/2023 1452   PROT 8.3 10/24/2023 1452   ALBUMIN 4.5 10/24/2023 1452   AST 14 10/24/2023 1452   ALT 28 10/24/2023 1452   ALKPHOS 90 10/24/2023 1452   BILITOT 0.7 10/24/2023 1452   GFRNONAA 54 (L) 10/18/2023 1930   GFRAA >60 01/30/2018 2037      Latest Ref Rng & Units 10/24/2023    2:52 PM 10/18/2023    7:30 PM 06/04/2023    4:03 PM  BMP  Glucose 70 - 99 mg/dL 792  857  86   BUN 6 - 23 mg/dL 33  21  35   Creatinine 0.40 - 1.50 mg/dL 8.44  8.55  8.73   Sodium 135 - 145 mEq/L 126  135  133   Potassium 3.5 - 5.1 mEq/L 4.4  4.6  4.0   Chloride 96 - 112 mEq/L 89  100  100   CO2 19 - 32 mEq/L 27  23  22    Calcium 8.4 - 10.5 mg/dL 9.8  9.9  9.7        Component Value Date/Time   WBC 10.1 10/24/2023 1452   RBC 5.54 10/24/2023 1452   HGB 13.9 10/24/2023 1452   HCT 43.4 10/24/2023 1452   PLT 416.0 (H) 10/24/2023 1452   MCV 78.4 10/24/2023 1452   MCH 25.0 (L) 10/18/2023 1930   MCHC 32.1 10/24/2023 1452   RDW 17.9 (H) 10/24/2023 1452   LYMPHSABS 2.3 10/24/2023 1452   MONOABS 1.0 10/24/2023 1452   EOSABS 0.3 10/24/2023 1452   BASOSABS 0.0 10/24/2023 1452     Parts of this note may have been dictated using voice recognition software. There may be variances in spelling and vocabulary which are unintentional. Not all errors are proofread. Please notify the dino if any discrepancies are noted or if the meaning of any statement is not clear.

## 2023-12-05 ENCOUNTER — Telehealth: Payer: Self-pay | Admitting: Nutrition

## 2023-12-05 NOTE — Telephone Encounter (Signed)
 Patient reported having taken his 35u of NPH and 9u of Humalog  at 5:15AM this morning before his coffee.  His blood sugar was 162 at 5:15AM. He waited 45 minutes before eating or drinking because his blood sugar climbed to 262.  He then ate fish, pinto beans and cabbage with coffee and cream, and his blood sugar is now 244.  Says insulin  is making his sugar go higher.  Please advise.

## 2023-12-12 ENCOUNTER — Other Ambulatory Visit: Payer: Self-pay | Admitting: Internal Medicine

## 2023-12-17 ENCOUNTER — Other Ambulatory Visit

## 2023-12-17 ENCOUNTER — Telehealth: Payer: Self-pay

## 2023-12-17 NOTE — Telephone Encounter (Signed)
 Patient called stating he was told he had an appointment today with Dr. Dartha, however he did not see it in Mychart. Requested call back for clarification

## 2023-12-20 ENCOUNTER — Ambulatory Visit
Admission: RE | Admit: 2023-12-20 | Discharge: 2023-12-20 | Disposition: A | Discharge status: Home / Self Care | Source: Ambulatory Visit | Attending: Family Medicine | Admitting: Family Medicine

## 2023-12-20 DIAGNOSIS — M4185 Other forms of scoliosis, thoracolumbar region: Secondary | ICD-10-CM | POA: Diagnosis not present

## 2023-12-20 DIAGNOSIS — M545 Low back pain, unspecified: Secondary | ICD-10-CM

## 2023-12-20 DIAGNOSIS — R102 Pelvic and perineal pain: Secondary | ICD-10-CM

## 2023-12-20 DIAGNOSIS — Z981 Arthrodesis status: Secondary | ICD-10-CM | POA: Diagnosis not present

## 2023-12-20 DIAGNOSIS — M1612 Unilateral primary osteoarthritis, left hip: Secondary | ICD-10-CM | POA: Diagnosis not present

## 2023-12-20 DIAGNOSIS — M47816 Spondylosis without myelopathy or radiculopathy, lumbar region: Secondary | ICD-10-CM | POA: Diagnosis not present

## 2023-12-20 DIAGNOSIS — K4090 Unilateral inguinal hernia, without obstruction or gangrene, not specified as recurrent: Secondary | ICD-10-CM | POA: Diagnosis not present

## 2023-12-20 DIAGNOSIS — M66852 Spontaneous rupture of other tendons, left thigh: Secondary | ICD-10-CM | POA: Diagnosis not present

## 2023-12-24 ENCOUNTER — Other Ambulatory Visit: Payer: Self-pay | Admitting: Family Medicine

## 2023-12-24 DIAGNOSIS — E113293 Type 2 diabetes mellitus with mild nonproliferative diabetic retinopathy without macular edema, bilateral: Secondary | ICD-10-CM | POA: Diagnosis not present

## 2023-12-24 DIAGNOSIS — K4090 Unilateral inguinal hernia, without obstruction or gangrene, not specified as recurrent: Secondary | ICD-10-CM

## 2023-12-24 DIAGNOSIS — H35033 Hypertensive retinopathy, bilateral: Secondary | ICD-10-CM | POA: Diagnosis not present

## 2023-12-24 DIAGNOSIS — H2513 Age-related nuclear cataract, bilateral: Secondary | ICD-10-CM | POA: Diagnosis not present

## 2023-12-24 DIAGNOSIS — H40013 Open angle with borderline findings, low risk, bilateral: Secondary | ICD-10-CM | POA: Diagnosis not present

## 2023-12-24 LAB — HM DIABETES EYE EXAM

## 2023-12-28 ENCOUNTER — Encounter: Payer: Self-pay | Admitting: Internal Medicine

## 2024-01-01 ENCOUNTER — Encounter: Payer: Self-pay | Admitting: "Endocrinology

## 2024-01-01 ENCOUNTER — Ambulatory Visit (INDEPENDENT_AMBULATORY_CARE_PROVIDER_SITE_OTHER): Admitting: "Endocrinology

## 2024-01-01 VITALS — BP 100/80 | HR 85 | Ht 68.0 in | Wt 235.0 lb

## 2024-01-01 DIAGNOSIS — Z7984 Long term (current) use of oral hypoglycemic drugs: Secondary | ICD-10-CM | POA: Diagnosis not present

## 2024-01-01 DIAGNOSIS — E782 Mixed hyperlipidemia: Secondary | ICD-10-CM

## 2024-01-01 DIAGNOSIS — Z794 Long term (current) use of insulin: Secondary | ICD-10-CM | POA: Diagnosis not present

## 2024-01-01 DIAGNOSIS — E1165 Type 2 diabetes mellitus with hyperglycemia: Secondary | ICD-10-CM | POA: Diagnosis not present

## 2024-01-01 DIAGNOSIS — Z7985 Long-term (current) use of injectable non-insulin antidiabetic drugs: Secondary | ICD-10-CM

## 2024-01-01 LAB — POCT GLYCOSYLATED HEMOGLOBIN (HGB A1C): Hemoglobin A1C: 7.7 % — AB (ref 4.0–5.6)

## 2024-01-01 MED ORDER — TRULICITY 3 MG/0.5ML ~~LOC~~ SOAJ
3.0000 mg | SUBCUTANEOUS | 1 refills | Status: DC
Start: 2024-01-01 — End: 2024-01-30

## 2024-01-01 NOTE — Progress Notes (Signed)
 Outpatient Endocrinology Note Steven Birmingham, MD  01/01/24   Steven [name] 05, 1962 [id number]  Referring Provider: Norleen Lynwood ORN, MD Primary Care Provider: Norleen Lynwood ORN, MD Reason for consultation: Subjective   Assessment & Plan  Diagnoses and all orders for this visit:  Uncontrolled type 2 diabetes mellitus with hyperglycemia, with long-term current use of insulin  (HCC) -     POCT glycosylated hemoglobin (Hb A1C) -     Lipid panel  Long term (current) use of oral hypoglycemic drugs  Long-term (current) use of injectable non-insulin  antidiabetic drugs  Long-term insulin  use (HCC)  Mixed hypercholesterolemia and hypertriglyceridemia  Other orders -     Dulaglutide  (TRULICITY ) 3 MG/0.5ML SOAJ; Inject 3 mg as directed once a week.   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, neuropathy  Lab Results  Component Value Date   GFR 46.75 (L) 10/24/2023   Hba1c goal less than 7, current Hba1c is  Lab Results  Component Value Date   HGBA1C 7.7 (A) 01/01/2024   Currently on: Trulicity  3 mg/week  Farxiga  10 mg daily, metformin  1 g twice daily, glipizide  XL 10 mg bid  NPH 35 units before coffee and 6 am and 32 units at night a day (take 15 min before break fast and dinner) Humalog  kwikpen correction scale: Use as needed based on blood sugars as follows: 151 - 175: 1 unit 176 - 200: 2 units 201 - 225: 3 units 226 - 250: 4 units 251 - 275: 5 units 276 - 300: 6 units 301 - 325: 7 units 326 - 350: 8 units 351 - 375: 9 units 376 - 400: 10 units   Goal is to decrease humalog  insulin  once settled on Trulicity   Check BG before meals Ordered DM education and instructed pt to bring all insulins to help with timings of intake as patient doesn't remember names of insulin   Use skin tac to allow dexcom to stay put  No known contraindications to any of above medications Glucagon  discussed and prescribed with refills on 12/04/22  -Last LD and Tg are as follows: Lab  Results  Component Value Date   LDLCALC 59 04/09/2023    Lab Results  Component Value Date   TRIG 246.0 (H) 04/09/2023   -on lovastatin  40 mg every day -Follow low fat diet and exercise   -Blood pressure goal <140/90 - Microalbumin/creatinine goal < 30 -Last MA/Cr is as follows: Lab Results  Component Value Date   MICROALBUR <0.2 10/02/2023   -on ACE/ARB lisinopril  5 mg every day -diet changes including salt restriction -limit eating outside -counseled BP targets per standards of diabetes care -uncontrolled blood pressure can lead to retinopathy, nephropathy and cardiovascular and atherosclerotic heart disease  Reviewed and counseled on: -A1C target -Blood sugar targets -Complications of uncontrolled diabetes  -Checking blood sugar before meals and bedtime and bring log next visit -All medications with mechanism of action and side effects -Hypoglycemia management: rule of 15's, Glucagon  Emergency Kit and medical alert ID -low-carb low-fat plate-method diet -At least 20 minutes of physical activity per day -Annual dilated retinal eye exam and foot exam -compliance and follow up needs -follow up as scheduled or earlier if problem gets worse  Call if blood sugar is less than 70 or consistently above 250    Take a 15 gm snack of carbohydrate at bedtime before you go to sleep if your blood sugar is less than 100.    If you are going to fast after midnight for a test  or procedure, ask your physician for instructions on how to reduce/decrease your insulin  dose.    Call if blood sugar is less than 70 or consistently above 250  -Treating a low sugar by rule of 15  (15 gms of sugar every 15 min until sugar is more than 70) If you feel your sugar is low, test your sugar to be sure If your sugar is low (less than 70), then take 15 grams of a fast acting Carbohydrate (3-4 glucose tablets or glucose gel or 4 ounces of juice or regular soda) Recheck your sugar 15 min after treating  low to make sure it is more than 70 If sugar is still less than 70, treat again with 15 grams of carbohydrate          Don't drive the hour of hypoglycemia  If unconscious/unable to eat or drink by mouth, use glucagon  injection or nasal spray baqsimi and call 911. Can repeat again in 15 min if still unconscious.  Return in about 29 days (around 01/30/2024).   I have reviewed current medications, nurse's notes, allergies, vital signs, past medical and surgical history, family medical history, and social history for this encounter. Counseled patient on symptoms, examination findings, lab findings, imaging results, treatment decisions and monitoring and prognosis. The patient understood the recommendations and agrees with the treatment plan. All questions regarding treatment plan were fully answered.  Steven Birmingham, MD  01/01/24  History of Present Illness Steven [name] is a 63 y.o. year old male who presents for follow up of Type II diabetes mellitus.  Steven [name] was first diagnosed in 2010.   Diabetes education +  Home diabetes regimen: Farxiga  10 mg daily, metformin  1 g twice daily, glipizide  XL 10 mg bid  NPH 35 units twice a day Humalog  15-20 units after break fast, 10 units after dinner Trulicity  1.5 mg/week  Previous history:     Previously on metformin  and started on insulin  in 10/21 He has only been on NPH A1c range in the last few years: 8.1-12.1  COMPLICATIONS -  MI/Stroke -  retinopathy +  neuropathy +  nephropathy  BLOOD SUGAR DATA CGM interpretation: At today's visit, we reviewed her CGM downloads. The full report is scanned in the media. Reviewing the CGM trends, BG are mostly well controlled except morning and low overnight.    Physical Exam  BP 100/80   Pulse 85   Ht 5' 8 (1.727 m)   Wt 235 lb (106.6 kg)   SpO2 96%   BMI 35.73 kg/m    Constitutional: well developed, well nourished Head: normocephalic, atraumatic Eyes: sclera anicteric, no  redness Neck: supple Lungs: normal respiratory effort Neurology: alert and oriented Skin: dry, no appreciable rashes Musculoskeletal: no appreciable defects Psychiatric: normal mood and affect Diabetic Foot Exam - Simple   No data filed      Current Medications Patient's Medications  New Prescriptions   DULAGLUTIDE  (TRULICITY ) 3 MG/0.5ML SOAJ    Inject 3 mg as directed once a week.  Previous Medications   ALLOPURINOL  (ZYLOPRIM ) 100 MG TABLET    Take 1 tablet (100 mg total) by mouth 2 (two) times daily.   CONTINUOUS GLUCOSE SENSOR (DEXCOM G7 SENSOR) MISC    1 Device by Does not apply route as directed. Change sensor every 10 days   DAPAGLIFLOZIN  PROPANEDIOL (FARXIGA ) 10 MG TABS TABLET    Take 1 tablet (10 mg total) by mouth daily.   DICLOFENAC  SODIUM (VOLTAREN ) 1 % GEL    Apply  2 g topically 4 (four) times daily.   FERROUS SULFATE  324 (65 FE) MG TBEC    Take 1 tablet (325 mg total) by mouth daily.   GABAPENTIN  (NEURONTIN ) 100 MG CAPSULE    Take 2 capsules (200 mg total) by mouth at bedtime.   GLIPIZIDE  (GLUCOTROL  XL) 10 MG 24 HR TABLET    Take 1 tablet (10 mg total) by mouth 2 (two) times daily.   HUMULIN  N 100 UNIT/ML INJECTION    Inject up to 30 units in morning and 40 units at bedtime   HYDROCHLOROTHIAZIDE  (HYDRODIURIL ) 25 MG TABLET    TAKE 1 TABLET BY MOUTH EVERY DAY   IBUPROFEN  (ADVIL ) 600 MG TABLET    Take 600 mg by mouth every 6 (six) hours as needed.   INSULIN  ASPART (NOVOLOG  FLEXPEN) 100 UNIT/ML FLEXPEN    Inject 25 Units into the skin daily before breakfast.   INSULIN  LISPRO (HUMALOG  KWIKPEN) 100 UNIT/ML KWIKPEN    Humalog  22 units after break fast, 18 units after dinner   INSULIN  PEN NEEDLE (RELION PEN NEEDLES) 32G X 4 MM MISC    1 Needle by Does not apply route 2 (two) times daily.   INSULIN  SYRINGE-NEEDLE U-100 (RELION INSULIN  SYRINGE) 31G X 15/64 0.5 ML MISC    Use to inject insulin  twice a day.   IRON  POLYSACCHARIDES (NU-IRON ) 150 MG CAPSULE    Take 1 capsule (150 mg  total) by mouth daily.   LISINOPRIL  (ZESTRIL ) 5 MG TABLET    Take 5 mg by mouth daily.   LOVASTATIN  (MEVACOR ) 40 MG TABLET    TAKE 1 TABLET BY MOUTH EVERYDAY AT BEDTIME   MELOXICAM  (MOBIC ) 15 MG TABLET    TAKE 1 TABLET BY MOUTH ONCE DAILY AS NEEDED FOR PAIN   METFORMIN  (GLUCOPHAGE ) 1000 MG TABLET    TAKE 1 TABLET BY MOUTH TWICE A DAY WITH FOOD   METHOCARBAMOL  (ROBAXIN ) 500 MG TABLET    Take 1 tablet (500 mg total) by mouth 4 (four) times daily.   OSTOMY SUPPLIES (SKIN TAC ADHESIVE BARRIER WIPE) MISC    Use to help apply Dexcom sensor every 10 days   OVER THE COUNTER MEDICATION    Take 1 tablet by mouth 2 (two) times daily. Beet Root supplement   OXYCODONE  (ROXICODONE ) 5 MG IMMEDIATE RELEASE TABLET    Take 1 tablet (5 mg total) by mouth every 4 (four) hours as needed for severe pain (pain score 7-10).   OXYCODONE -ACETAMINOPHEN  (PERCOCET) 7.5-325 MG TABLET    Take 1 tablet by mouth every 6 (six) hours as needed for severe pain (pain score 7-10).   PANTOPRAZOLE  (PROTONIX ) 40 MG TABLET    Take 1 tablet (40 mg total) by mouth daily.   SOLIFENACIN  (VESICARE ) 5 MG TABLET    TAKE 1 TABLET (5 MG TOTAL) BY MOUTH DAILY.   TAMSULOSIN  (FLOMAX ) 0.4 MG CAPS CAPSULE    TAKE 1 CAPSULE BY MOUTH EVERY DAY   TRAMADOL  (ULTRAM ) 50 MG TABLET    Take 1 tablet (50 mg total) by mouth every 6 (six) hours as needed.  Modified Medications   No medications on file  Discontinued Medications   DULAGLUTIDE  (TRULICITY ) 1.5 MG/0.5ML SOAJ    Inject 1.5 mg into the skin once a week.    Allergies No Known Allergies  Past Medical History Past Medical History:  Diagnosis Date   Chicken pox    Diabetes mellitus without complication (HCC)    Essential hypertension    GERD (gastroesophageal reflux disease)  Gouty arthritis    Hypertension    Iron  deficiency anemia    Vitamin D  deficiency     Past Surgical History Past Surgical History:  Procedure Laterality Date   BIOPSY  12/05/2021   Procedure: BIOPSY;  Surgeon:  Legrand Victory LITTIE DOUGLAS, MD;  Location: [facility];  Service: Gastroenterology;;   ESOPHAGOGASTRODUODENOSCOPY N/A 12/05/2021   Procedure: ESOPHAGOGASTRODUODENOSCOPY (EGD);  Surgeon: Legrand Victory LITTIE DOUGLAS, MD;  Location: [facility];  Service: Gastroenterology;  Laterality: N/A;   HOT HEMOSTASIS N/A 12/05/2021   Procedure: HOT HEMOSTASIS (ARGON PLASMA COAGULATION/BICAP);  Surgeon: Legrand Victory LITTIE DOUGLAS, MD;  Location: [facility];  Service: Gastroenterology;  Laterality: N/A;   INGUINAL HERNIA REPAIR Right 12/19/2017   Procedure: HERNIA REPAIR INGUINAL RIGHT;  Surgeon: Vernetta Berg, MD;  Location: [facility];  Service: General;  Laterality: Right;   INSERTION OF MESH Right 12/19/2017   Procedure: INSERTION OF MESH;  Surgeon: Vernetta Berg, MD;  Location: [facility];  Service: General;  Laterality: Right;   MASS EXCISION Right 12/19/2017   Procedure: EXCISION OF RIGHT SCROTAL MASS;  Surgeon: Vernetta Berg, MD;  Location: [facility];  Service: General;  Laterality: Right;   TONSILLECTOMY      Family History family history includes Diabetes in his mother; Heart disease in his mother; Hypertension in his father and mother; Stroke in his brother and father.  Social History Social History   Socioeconomic History   Marital status: Divorced    Spouse name: Not on file   Number of children: 1   Years of education: 12   Highest education level: 12th grade  Occupational History   Occupation: retired  Tobacco Use   Smoking status: Former    Current packs/day: 2.00    Average packs/day: 2.0 packs/day for 25.0 years (50.0 ttl pk-yrs)    Types: Cigarettes   Smokeless tobacco: Former    Types: Engineer, drilling   Vaping status: Never Used  Substance and Sexual Activity   Alcohol use: Not Currently   Drug use: No   Sexual activity: Yes    Birth control/protection: Condom  Other Topics Concern   Not on file  Social History Narrative   Fun: Just working   Social Drivers of Research scientist (physical sciences) Strain: Low Risk  (05/16/2023)   Overall Financial Resource Strain (CARDIA)    Difficulty of Paying Living Expenses: Not very hard  Recent Concern: Financial Resource Strain - Medium Risk (04/07/2023)   Overall Financial Resource Strain (CARDIA)    Difficulty of Paying Living Expenses: Somewhat hard  Food Insecurity: No Food Insecurity (05/16/2023)   Hunger Vital Sign    Worried About Running Out of Food in the Last Year: Never true    Ran Out of Food in the Last Year: Never true  Recent Concern: Food Insecurity - Food Insecurity Present (04/07/2023)   Hunger Vital Sign    Worried About Running Out of Food in the Last Year: Sometimes true    Ran Out of Food in the Last Year: Sometimes true  Transportation Needs: No Transportation Needs (05/16/2023)   PRAPARE - Administrator, Civil Service (Medical): No    Lack of Transportation (Non-Medical): No  Physical Activity: Insufficiently Active (05/16/2023)   Exercise Vital Sign    Days of Exercise per Week: 2 days    Minutes of Exercise per Session: 10 min  Stress: No Stress Concern Present (05/16/2023)   [employer] of Occupational Health - Occupational Stress Questionnaire  Feeling of Stress : Not at all  Social Connections: Moderately Isolated (05/16/2023)   Social Connection and Isolation Panel    Frequency of Communication with Friends and Family: More than three times a week    Frequency of Social Gatherings with Friends and Family: Once a week    Attends Religious Services: 1 to 4 times per year    Active Member of Clubs or Organizations: No    Attends Engineer, structural: Not on file    Marital Status: Divorced  Intimate Partner Violence: Not on file    Lab Results  Component Value Date   HGBA1C 7.7 (A) 01/01/2024   HGBA1C 8.8 (H) 10/24/2023   HGBA1C 7.6 (A) 09/03/2023   Lab Results  Component Value Date   CHOL 140 04/09/2023   Lab Results  Component Value Date   HDL 32.00 (L)  04/09/2023   Lab Results  Component Value Date   LDLCALC 59 04/09/2023   Lab Results  Component Value Date   TRIG 246.0 (H) 04/09/2023   Lab Results  Component Value Date   CHOLHDL 4 04/09/2023   Lab Results  Component Value Date   CREATININE 1.55 (H) 10/24/2023   Lab Results  Component Value Date   GFR 46.75 (L) 10/24/2023   Lab Results  Component Value Date   MICROALBUR <0.2 10/02/2023      Component Value Date/Time   NA 126 (L) 10/24/2023 1452   K 4.4 10/24/2023 1452   CL 89 (L) 10/24/2023 1452   CO2 27 10/24/2023 1452   GLUCOSE 207 (H) 10/24/2023 1452   BUN 33 (H) 10/24/2023 1452   CREATININE 1.55 (H) 10/24/2023 1452   CALCIUM 9.8 10/24/2023 1452   PROT 8.3 10/24/2023 1452   ALBUMIN 4.5 10/24/2023 1452   AST 14 10/24/2023 1452   ALT 28 10/24/2023 1452   ALKPHOS 90 10/24/2023 1452   BILITOT 0.7 10/24/2023 1452   GFRNONAA 54 (L) 10/18/2023 1930   GFRAA >60 01/30/2018 2037      Latest Ref Rng & Units 10/24/2023    2:52 PM 10/18/2023    7:30 PM 06/04/2023    4:03 PM  BMP  Glucose 70 - 99 mg/dL 792  857  86   BUN 6 - 23 mg/dL 33  21  35   Creatinine 0.40 - 1.50 mg/dL 8.44  8.55  8.73   Sodium 135 - 145 mEq/L 126  135  133   Potassium 3.5 - 5.1 mEq/L 4.4  4.6  4.0   Chloride 96 - 112 mEq/L 89  100  100   CO2 19 - 32 mEq/L 27  23  22    Calcium 8.4 - 10.5 mg/dL 9.8  9.9  9.7        Component Value Date/Time   WBC 10.1 10/24/2023 1452   RBC 5.54 10/24/2023 1452   HGB 13.9 10/24/2023 1452   HCT 43.4 10/24/2023 1452   PLT 416.0 (H) 10/24/2023 1452   MCV 78.4 10/24/2023 1452   MCH 25.0 (L) 10/18/2023 1930   MCHC 32.1 10/24/2023 1452   RDW 17.9 (H) 10/24/2023 1452   LYMPHSABS 2.3 10/24/2023 1452   MONOABS 1.0 10/24/2023 1452   EOSABS 0.3 10/24/2023 1452   BASOSABS 0.0 10/24/2023 1452     Parts of this note may have been dictated using voice recognition software. There may be variances in spelling and vocabulary which are unintentional. Not all errors  are proofread. Please notify the dino if any discrepancies are noted  or if the meaning of any statement is not clear.

## 2024-01-01 NOTE — Patient Instructions (Signed)

## 2024-01-04 ENCOUNTER — Ambulatory Visit (INDEPENDENT_AMBULATORY_CARE_PROVIDER_SITE_OTHER): Admitting: Podiatry

## 2024-01-04 ENCOUNTER — Other Ambulatory Visit: Payer: Self-pay | Admitting: "Endocrinology

## 2024-01-04 ENCOUNTER — Encounter: Payer: Self-pay | Admitting: Podiatry

## 2024-01-04 DIAGNOSIS — B351 Tinea unguium: Secondary | ICD-10-CM | POA: Diagnosis not present

## 2024-01-04 DIAGNOSIS — M79674 Pain in right toe(s): Secondary | ICD-10-CM | POA: Diagnosis not present

## 2024-01-04 DIAGNOSIS — L600 Ingrowing nail: Secondary | ICD-10-CM | POA: Diagnosis not present

## 2024-01-04 DIAGNOSIS — E1140 Type 2 diabetes mellitus with diabetic neuropathy, unspecified: Secondary | ICD-10-CM | POA: Diagnosis not present

## 2024-01-04 DIAGNOSIS — M79675 Pain in left toe(s): Secondary | ICD-10-CM | POA: Diagnosis not present

## 2024-01-04 NOTE — Patient Instructions (Signed)
 Soak toe in warm Epsom salts and keep antibiotic ointment and Band-Aid applied to keep the nail border soft.  This will help alleviate discomfort with ingrown toenails we can get it removed.

## 2024-01-04 NOTE — Progress Notes (Signed)
 Chief Complaint  Patient presents with   Nail Problem    Thick painful toenails, 3 month follow up    Diabetes    HPI: 63 y.o. male presents today for diabetic footcare.  He is doing well.  He does report some concern for ingrown toenail affecting the right first toe lateral border at this time.  The medial border has healed well and is not causing him any problems.  He presents with painful, thickened toenails that are dystrophic and difficult for him to maintain himself.  Reports good control of the blood sugars.  Past Medical History:  Diagnosis Date   Chicken pox    Diabetes mellitus without complication (HCC)    Essential hypertension    GERD (gastroesophageal reflux disease)    Gouty arthritis    Hypertension    Iron  deficiency anemia    Vitamin D  deficiency     Past Surgical History:  Procedure Laterality Date   BIOPSY  12/05/2021   Procedure: BIOPSY;  Surgeon: Legrand Victory LITTIE DOUGLAS, MD;  Location: [facility];  Service: Gastroenterology;;   ESOPHAGOGASTRODUODENOSCOPY N/A 12/05/2021   Procedure: ESOPHAGOGASTRODUODENOSCOPY (EGD);  Surgeon: Legrand Victory LITTIE DOUGLAS, MD;  Location: [facility];  Service: Gastroenterology;  Laterality: N/A;   HOT HEMOSTASIS N/A 12/05/2021   Procedure: HOT HEMOSTASIS (ARGON PLASMA COAGULATION/BICAP);  Surgeon: Legrand Victory LITTIE DOUGLAS, MD;  Location: [facility];  Service: Gastroenterology;  Laterality: N/A;   INGUINAL HERNIA REPAIR Right 12/19/2017   Procedure: HERNIA REPAIR INGUINAL RIGHT;  Surgeon: Vernetta Berg, MD;  Location: [facility];  Service: General;  Laterality: Right;   INSERTION OF MESH Right 12/19/2017   Procedure: INSERTION OF MESH;  Surgeon: Vernetta Berg, MD;  Location: [facility];  Service: General;  Laterality: Right;   MASS EXCISION Right 12/19/2017   Procedure: EXCISION OF RIGHT SCROTAL MASS;  Surgeon: Vernetta Berg, MD;  Location: [facility];  Service: General;  Laterality: Right;   TONSILLECTOMY      No Known Allergies  ROS     Physical Exam: There were no vitals filed for this visit.  General: The patient is alert and oriented x3 in no acute distress.  Dermatology: Skin is warm, dry and supple bilateral lower extremities. Interspaces are clear of maceration and debris.  Nail plates x 10 are thickened, elongated, dystrophic with yellow discoloration and subungual debris.  Tender on direct dorsal palpation of the right hallux border does show some incurvation and is tender.  No acute signs of infection.  Vascular: Palpable pedal pulses bilaterally. Capillary refill within normal limits.  No appreciable edema.  No erythema or calor.  Neurological: Light touch sensation grossly intact bilateral feet.  Protective sensation decreased.  Musculoskeletal Exam: No pedal deformities noted.  Muscle strength 5 out of 5 in all major muscle groups   Assessment/Plan of Care: 1. Pain due to onychomycosis of toenails of both feet   2. Type 2 diabetes, controlled, with neuropathy (HCC)      No orders of the defined types were placed in this encounter.  None  Discussed clinical findings with patient today.  #Onychomycosis with pain  -Nails palliatively debrided as below. -Educated on self-care  Procedure: Nail Debridement Rationale: Pain Type of Debridement: manual, sharp debridement. Instrumentation: Nail nipper, rotary burr. Number of Nails: 10  Patient educated on diabetes. Discussed proper diabetic foot care and discussed risks and complications of disease. Educated patient in depth on reasons to return to the office immediately should he/she discover anything  concerning or new on the feet. All questions answered. Discussed proper shoes as well.   # Ingrowing nail right first toe lateral border - Findings discussed with patient - Slant back nail trim performed today - Discussed proceeding with PNA procedure in 1 to 2 weeks if still symptomatic.  He did heal previous procedures to the medial border recently  without any incident - No signs of acute infection.  Monitor for any developing symptoms. -I certify that this diagnosis represents a distinct and separate diagnosis that requires evaluation and treatment separate from other procedures or diagnosis   Follow-up in 1 to 2 weeks.  Talayia Hjort L. Lamount MAUL, AACFAS [facility]     [address] [place], [place] [address]                Office [phone]  Fax [phone]

## 2024-01-08 ENCOUNTER — Ambulatory Visit: Admitting: "Endocrinology

## 2024-01-09 NOTE — Telephone Encounter (Unsigned)
 Copied from CRM [id number]. Topic: Clinical - Medication Refill >> Jan 09, 2024 10:22 AM Frederich PARAS wrote: Medication: Oxycodone -acetaminophen  7.5-325 MG, Tramadol  50MG , CYCLOBENZAPRINE  5MG   Has the patient contacted their pharmacy? Yes (Agent: If no, request that the patient contact the pharmacy for the refill. If patient does not wish to contact the pharmacy document the reason why and proceed with request.) (Agent: If yes, when and what did the pharmacy advise?)  This is the patient's preferred pharmacy:  [facility] [address] [place], [place] - [address] [place] [address] Phone: [phone] Fax: [phone]  [facility] - [facility] [address], [address] [place] [address] Phone: [phone] Fax: [phone]  Is this the correct pharmacy for this prescription? Yes If no, delete pharmacy and type the correct one.   Has the prescription been filled recently? Yes  Is the patient out of the medication? Yes  Has the patient been seen for an appointment in the last year OR does the patient have an upcoming appointment? Yes  Can we respond through MyChart? No  Agent: Please be advised that Rx refills may take up to 3 business days. We ask that you follow-up with your pharmacy.

## 2024-01-10 ENCOUNTER — Ambulatory Visit (INDEPENDENT_AMBULATORY_CARE_PROVIDER_SITE_OTHER): Admitting: Podiatry

## 2024-01-10 ENCOUNTER — Encounter: Payer: Self-pay | Admitting: Podiatry

## 2024-01-10 DIAGNOSIS — L600 Ingrowing nail: Secondary | ICD-10-CM

## 2024-01-10 MED ORDER — NEOMYCIN-POLYMYXIN-HC 3.5-10000-1 OT SUSP: OTIC | 0 refills | Status: AC

## 2024-01-10 NOTE — Progress Notes (Unsigned)
 Subjective:  Patient ID: Steven [name], male    DOB: 04-10-1961,  MRN: [id number]  Steven [name] presents to clinic today for:  Chief Complaint  Patient presents with   Ingrown Toenail    Right hallux, lateral border. Not infected but very painful. A1c 7 in Aug. No anti coag.   Patient comes in for above complaint.  He comes in for planned procedure for ingrown toenail removal of the right hallux lateral border.  Did recently have the right hallux medial border treated and is healing well for this.  The lateral borders only recently become painful.  PCP is Norleen Lynwood ORN, MD.  No Known Allergies  Review of Systems: Negative except as noted in the HPI.  Objective:  There were no vitals filed for this visit.  Steven [name] is a pleasant 63 y.o. male in NAD. AAO x 3.  Vascular Examination: Capillary refill time is less than 3 seconds to toes bilateral. Palpable pedal pulses b/l LE. Digital hair present b/l. No pedal edema b/l. Skin temperature gradient WNL b/l. No varicosities b/l. No cyanosis or clubbing noted b/l.   Dermatological Examination: There is incurvation of the right lateral nail border.  There is pain on palpation of the affected nail border.  Mild edema present, no erythema.  Neurological Examination: Light touch sensation grossly intact.  Protective sensation decreased.     Latest Ref Rng & Units 01/01/2024   11:02 AM 10/24/2023    2:52 PM 09/03/2023    9:38 AM 04/09/2023   10:09 AM 03/07/2023    9:16 AM  Hemoglobin A1C  Hemoglobin-A1c 4.0 - 5.6 % 7.7  8.8  7.6  8.6  7.9      Assessment/Plan: 1. Ingrown toenail of right foot     Meds ordered this encounter  Medications   neomycin -polymyxin-hydrocortisone (CORTISPORIN) 3.5-10000-1 OTIC suspension    Sig: Apply 1-2 drops daily after soaking and cover with bandaid    Dispense:  10 mL    Refill:  0    Discussed patient's condition today.  After obtaining patient consent, the right hallux was anesthetized  with a 50:50 mixture of 1% lidocaine  plain and 0.5% bupivacaine  plain for a total of 3cc's administered.  Upon confirmation of anesthesia, a freer elevator was utilized to free the lateral nail border from the nail bed.  The nail border was then avulsed proximal to the eponychium and removed in toto.  The area was inspected for any remaining spicules.  A chemical matrixectomy was performed with phenol and neutralized with isopropyl alcohol solution.  Antibiotic ointment and a DSD were applied, followed by a Coban dressing.  Patient tolerated the anesthetic and procedure well and will f/u in 2-3 weeks for recheck.  Patient given post-procedure instructions for daily 20-minute Epsom salt soaks, antibiotic drops and daily use of Bandaids until toe starts to dry / form eschar.   New prescription sent in for Corticosporin drops for aftercare of the ingrown toenail site.  Procedure medically necessary and is involving the opposite border of the right first toe then was previously treated.   Return in about 2 weeks (around 01/24/2024) for Nail Check.   Ethan Saddler, DPM, AACFAS [facility]     [address]  [place], [place] [address]                Office [phone]  Fax [phone]

## 2024-01-10 NOTE — Patient Instructions (Addendum)
 Place 1/4 cup of epsom salts in a quart of warm tap water.  Submerge your foot or feet in the solution and soak for 20 minutes.  This soak should be done twice a day.  Next, remove your foot or feet from solution, blot dry the affected area. Apply antibiotic drops and cover if instructed by your doctor. For the full 2 weeks.   IF YOUR SKIN BECOMES IRRITATED WHILE USING THESE INSTRUCTIONS, IT IS OKAY TO SWITCH TO  WHITE VINEGAR AND WATER.  As another alternative soak, you may use antibacterial soap and water.  Monitor for any signs/symptoms of infection. Call the office immediately if any occur or go directly to the emergency room. Call with any questions/concerns.

## 2024-01-21 ENCOUNTER — Telehealth: Payer: Self-pay

## 2024-01-21 NOTE — Telephone Encounter (Signed)
 Copied from CRM #[id number]. Topic: Clinical - Medication Question >> Jan 21, 2024 12:33 PM Drema MATSU wrote: Reason for CRM: Patient is requesting a callback from nurse in regards to 2-3  medications and other questions

## 2024-01-23 NOTE — Telephone Encounter (Signed)
 Copied from CRM [id number]. Topic: Clinical - Medication Question >> Jan 23, 2024  8:57 AM Berneda FALCON wrote: Reason for CRM: Patient states he called on Monday about the questions about some medications and some additional questions. He did not get any callback on Monday or Tuesday and would like someone to please call him and discuss these things.  Please call him back at [phone]

## 2024-01-24 ENCOUNTER — Telehealth: Payer: Self-pay

## 2024-01-24 ENCOUNTER — Ambulatory Visit (INDEPENDENT_AMBULATORY_CARE_PROVIDER_SITE_OTHER): Admitting: Podiatry

## 2024-01-24 ENCOUNTER — Encounter: Payer: Self-pay | Admitting: Podiatry

## 2024-01-24 DIAGNOSIS — L600 Ingrowing nail: Secondary | ICD-10-CM | POA: Diagnosis not present

## 2024-01-24 MED ORDER — TRAMADOL HCL 50 MG PO TABS: 50.0000 mg | ORAL_TABLET | Freq: Four times a day (QID) | ORAL | 1 refills | Status: AC | PRN

## 2024-01-24 MED ORDER — METHOCARBAMOL 500 MG PO TABS: 500.0000 mg | ORAL_TABLET | Freq: Four times a day (QID) | ORAL | 2 refills | Status: AC

## 2024-01-24 NOTE — Telephone Encounter (Signed)
 Copied from CRM [id number]. Topic: Clinical - Medication Question >> Jan 24, 2024  2:47 PM Martinique E wrote: Reason for CRM: Adria from [facility] called in stating that she needs the diagnosis code for patient's traMADol  (ULTRAM ) 50 MG tablet. Callback number [phone].

## 2024-01-24 NOTE — Telephone Encounter (Signed)
 Ok for robaxin  and tramadol , but we have to hold on the percocet if he is already taking tramadol    thanks

## 2024-01-24 NOTE — Progress Notes (Unsigned)
       Subjective:  Patient ID: Steven [name], male    DOB: April 14, 1961,  MRN: [id number]  Chief Complaint  Patient presents with   Ingrown Toenail    Follow up ingrown R Hallux lateral.  Feeling good no pain.  Diabetic A1c 7.7  no anti coag    Steven [name] presents to clinic today for f/u of PNA to the right hallux lateral border.  He is doing well with this.  Denies pain.  Feels that is healing pretty well.  PCP is Norleen Lynwood ORN, MD.  No Known Allergies  Objective:  There were no vitals filed for this visit.  Vascular Examination: Capillary refill time is less than 3 seconds to toes bilateral. Palpable pedal pulses b/l LE. Digital hair present b/l. No pedal edema b/l. Skin temperature gradient WNL b/l. No varicosities b/l. No cyanosis or clubbing noted b/l.   Dermatological Examination: Upon inspection of the PNA site, there are no clinical signs of infection.  No purulence, no necrosis, no malodor present.  Minimal to no erythema present.  Eschar formed along nail margin.  Minimal to no pain on palpation of area.   Assessment/Plan: 1. Ingrown toenail of right foot     No orders of the defined types were placed in this encounter.  Findings reviewed with patient.  He seems to be doing pretty well.  No pain on palpation of the area.  Appears nearly fully healed this point.  No need to continue further wound care to the area.  Return in about 9 weeks (around 03/27/2024) for Diabetic Foot Care.   Thaniel Coluccio L. Lamount MAUL, AACFAS [facility]     [address] [place], [place] [address]                Office [phone]  Fax [phone]

## 2024-01-25 ENCOUNTER — Encounter: Payer: Self-pay | Admitting: Podiatry

## 2024-01-25 NOTE — Telephone Encounter (Signed)
 Called and informed Ronal at [facility] pharmacy of diagnosis. They expressed understanding and will get medication filled for patient

## 2024-01-25 NOTE — Telephone Encounter (Signed)
 Copied from CRM [id number]. Topic: Clinical - Prescription Issue >> Jan 25, 2024  9:54 AM Mercedes MATSU wrote: Reason for CRM: Diagnosis code is needed for tramadol  prescription for patient, [facility] is requesting call back and can be reached at [phone] Ronal will be available all day.

## 2024-01-25 NOTE — Telephone Encounter (Signed)
 Bilateral hand pain, cervical pain, and lumbar pain

## 2024-01-30 ENCOUNTER — Ambulatory Visit (INDEPENDENT_AMBULATORY_CARE_PROVIDER_SITE_OTHER): Admitting: "Endocrinology

## 2024-01-30 ENCOUNTER — Encounter: Payer: Self-pay | Admitting: "Endocrinology

## 2024-01-30 VITALS — BP 134/80 | HR 85 | Ht 68.0 in | Wt 235.0 lb

## 2024-01-30 DIAGNOSIS — Z7985 Long-term (current) use of injectable non-insulin antidiabetic drugs: Secondary | ICD-10-CM | POA: Diagnosis not present

## 2024-01-30 DIAGNOSIS — E1165 Type 2 diabetes mellitus with hyperglycemia: Secondary | ICD-10-CM

## 2024-01-30 DIAGNOSIS — E782 Mixed hyperlipidemia: Secondary | ICD-10-CM

## 2024-01-30 DIAGNOSIS — Z7984 Long term (current) use of oral hypoglycemic drugs: Secondary | ICD-10-CM | POA: Diagnosis not present

## 2024-01-30 DIAGNOSIS — Z794 Long term (current) use of insulin: Secondary | ICD-10-CM

## 2024-01-30 MED ORDER — TRULICITY 4.5 MG/0.5ML ~~LOC~~ SOAJ
4.5000 mg | SUBCUTANEOUS | 3 refills | Status: DC
Start: 2024-01-30 — End: 2024-04-07

## 2024-01-30 NOTE — Progress Notes (Signed)
 Outpatient Endocrinology Note Steven Birmingham, MD  01/30/24   Steven [name] Dec 15-Jan-1961 [id number]  Referring Provider: Norleen [name] ORN, MD Primary Care Provider: Norleen [name] ORN, MD Reason for consultation: Subjective   Assessment & Plan  Diagnoses and all orders for this visit:  Uncontrolled type 2 diabetes mellitus with hyperglycemia, with long-term current use of insulin  (HCC)  Long term (current) use of oral hypoglycemic drugs  Long-term (current) use of injectable non-insulin  antidiabetic drugs  Long-term insulin  use (HCC)  Mixed hypercholesterolemia and hypertriglyceridemia  Other orders -     Dulaglutide  (TRULICITY ) 4.5 MG/0.5ML SOAJ; Inject 4.5 mg as directed once a week.    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, neuropathy  Lab Results  Component Value Date   GFR 46.75 (L) 10/24/2023   Hba1c goal less than 7, current Hba1c is  Lab Results  Component Value Date   HGBA1C 7.7 (A) 01/01/2024   Currently on: C-peptide + Trulicity  4.5 mg/week  Farxiga  10 mg daily, metformin  1 g twice daily, glipizide  XL 10 mg bid  NPH 38-40 units before coffee at 6 am and 34-35 units at night a day (take 15 min before break fast and dinner) Humalog  kwikpen correction scale: Use as needed based on blood sugars as follows: 151 - 175: 1 unit 176 - 200: 2 units 201 - 225: 3 units 226 - 250: 4 units 251 - 275: 5 units 276 - 300: 6 units 301 - 325: 7 units 326 - 350: 8 units 351 - 375: 9 units 376 - 400: 10 units   Goal is to decrease humalog  insulin  once settled on Trulicity   Check BG before meals Ordered DM education and instructed pt to bring all insulins to help with timings of intake as patient doesn't remember names of insulin   Use skin tac to allow dexcom to stay put  No known contraindications to any of above medications Glucagon  discussed and prescribed with refills on 12/04/22  -Last LD and Tg are as follows: Lab Results  Component Value Date   LDLCALC  59 04/09/2023    Lab Results  Component Value Date   TRIG 246.0 (H) 04/09/2023   -on lovastatin  40 mg every day -Follow low fat diet and exercise   -Blood pressure goal <140/90 - Microalbumin/creatinine goal < 30 -Last MA/Cr is as follows: Lab Results  Component Value Date   MICROALBUR <0.2 10/02/2023   -on ACE/ARB lisinopril  5 mg every day -diet changes including salt restriction -limit eating outside -counseled BP targets per standards of diabetes care -uncontrolled blood pressure can lead to retinopathy, nephropathy and cardiovascular and atherosclerotic heart disease  Reviewed and counseled on: -A1C target -Blood sugar targets -Complications of uncontrolled diabetes  -Checking blood sugar before meals and bedtime and bring log next visit -All medications with mechanism of action and side effects -Hypoglycemia management: rule of 15's, Glucagon  Emergency Kit and medical alert ID -low-carb low-fat plate-method diet -At least 20 minutes of physical activity per day -Annual dilated retinal eye exam and foot exam -compliance and follow up needs -follow up as scheduled or earlier if problem gets worse  Call if blood sugar is less than 70 or consistently above 250    Take a 15 gm snack of carbohydrate at bedtime before you go to sleep if your blood sugar is less than 100.    If you are going to fast after midnight for a test or procedure, ask your physician for instructions on how to reduce/decrease your insulin  dose.  Call if blood sugar is less than 70 or consistently above 250  -Treating a low sugar by rule of 15  (15 gms of sugar every 15 min until sugar is more than 70) If you feel your sugar is low, test your sugar to be sure If your sugar is low (less than 70), then take 15 grams of a fast acting Carbohydrate (3-4 glucose tablets or glucose gel or 4 ounces of juice or regular soda) Recheck your sugar 15 min after treating low to make sure it is more than 70 If  sugar is still less than 70, treat again with 15 grams of carbohydrate          Don't drive the hour of hypoglycemia  If unconscious/unable to eat or drink by mouth, use glucagon  injection or nasal spray baqsimi and call 911. Can repeat again in 15 min if still unconscious.  Return in about 2 months (around 03/31/2024) for visit and 8 am labs before next visit.   I have reviewed current medications, nurse's notes, allergies, vital signs, past medical and surgical history, family medical history, and social history for this encounter. Counseled patient on symptoms, examination findings, lab findings, imaging results, treatment decisions and monitoring and prognosis. The patient understood the recommendations and agrees with the treatment plan. All questions regarding treatment plan were fully answered.  Steven Birmingham, MD  01/30/24  History of Present Illness Steven [name] is a 63 y.o. year old male who presents for follow up of Type II diabetes mellitus.  Steven [name] was first diagnosed in 2010.   Diabetes education +  Home diabetes regimen: Farxiga  10 mg daily, metformin  1 g twice daily, glipizide  XL 10 mg bid  NPH 35 units twice a day Humalog  15-20 units after break fast, 10 units after dinner Trulicity  3 mg/week  Previous history:     Previously on metformin  and started on insulin  in 10/21 He has only been on NPH A1c range in the last few years: 8.1-12.1  COMPLICATIONS -  MI/Stroke -  retinopathy +  neuropathy +  nephropathy  BLOOD SUGAR DATA CGM interpretation: At today's visit, we reviewed her CGM downloads. The full report is scanned in the media. Reviewing the CGM trends, BG are high after break fast and supper.   Physical Exam  BP 134/80   Pulse 85   Ht 5' 8 (1.727 m)   Wt 235 lb (106.6 kg)   SpO2 96%   BMI 35.73 kg/m    Constitutional: well developed, well nourished Head: normocephalic, atraumatic Eyes: sclera anicteric, no redness Neck: supple Lungs:  normal respiratory effort Neurology: alert and oriented Skin: dry, no appreciable rashes Musculoskeletal: no appreciable defects Psychiatric: normal mood and affect Diabetic Foot Exam - Simple   No data filed      Current Medications Patient's Medications  New Prescriptions   DULAGLUTIDE  (TRULICITY ) 4.5 MG/0.5ML SOAJ    Inject 4.5 mg as directed once a week.  Previous Medications   ALLOPURINOL  (ZYLOPRIM ) 100 MG TABLET    Take 1 tablet (100 mg total) by mouth 2 (two) times daily.   CONTINUOUS GLUCOSE SENSOR (DEXCOM G7 SENSOR) MISC    1 Device by Does not apply route as directed. Change sensor every 10 days   DAPAGLIFLOZIN  PROPANEDIOL (FARXIGA ) 10 MG TABS TABLET    Take 1 tablet (10 mg total) by mouth daily.   DICLOFENAC  SODIUM (VOLTAREN ) 1 % GEL    Apply 2 g topically 4 (four) times daily.   FERROUS SULFATE   324 (65 FE) MG TBEC    Take 1 tablet (325 mg total) by mouth daily.   GABAPENTIN  (NEURONTIN ) 100 MG CAPSULE    Take 2 capsules (200 mg total) by mouth at bedtime.   GLIPIZIDE  (GLUCOTROL  XL) 10 MG 24 HR TABLET    Take 1 tablet (10 mg total) by mouth 2 (two) times daily.   HUMULIN  N 100 UNIT/ML INJECTION    Inject up to 30 units in morning and 40 units at bedtime   HYDROCHLOROTHIAZIDE  (HYDRODIURIL ) 25 MG TABLET    TAKE 1 TABLET BY MOUTH EVERY DAY   IBUPROFEN  (ADVIL ) 600 MG TABLET    Take 600 mg by mouth every 6 (six) hours as needed.   INSULIN  ASPART (NOVOLOG  FLEXPEN) 100 UNIT/ML FLEXPEN    Inject 25 Units into the skin daily before breakfast.   INSULIN  LISPRO (HUMALOG  KWIKPEN) 100 UNIT/ML KWIKPEN    Humalog  22 units after break fast, 18 units after dinner   INSULIN  SYRINGE-NEEDLE U-100 (RELION INSULIN  SYRINGE) 31G X 15/64 0.5 ML MISC    Use to inject insulin  twice a day.   IRON  POLYSACCHARIDES (NU-IRON ) 150 MG CAPSULE    Take 1 capsule (150 mg total) by mouth daily.   LISINOPRIL  (ZESTRIL ) 5 MG TABLET    Take 5 mg by mouth daily.   LOVASTATIN  (MEVACOR ) 40 MG TABLET    TAKE 1 TABLET BY  MOUTH EVERYDAY AT BEDTIME   MELOXICAM  (MOBIC ) 15 MG TABLET    TAKE 1 TABLET BY MOUTH ONCE DAILY AS NEEDED FOR PAIN   METFORMIN  (GLUCOPHAGE ) 1000 MG TABLET    TAKE 1 TABLET BY MOUTH TWICE A DAY WITH FOOD   METHOCARBAMOL  (ROBAXIN ) 500 MG TABLET    Take 1 tablet (500 mg total) by mouth 4 (four) times daily.   NEOMYCIN -POLYMYXIN-HYDROCORTISONE (CORTISPORIN) 3.5-10000-1 OTIC SUSPENSION    Apply 1-2 drops daily after soaking and cover with bandaid   OSTOMY SUPPLIES (SKIN TAC ADHESIVE BARRIER WIPE) MISC    Use to help apply Dexcom sensor every 10 days   OVER THE COUNTER MEDICATION    Take 1 tablet by mouth 2 (two) times daily. Beet Root supplement   PANTOPRAZOLE  (PROTONIX ) 40 MG TABLET    Take 1 tablet (40 mg total) by mouth daily.   RELION PEN NEEDLE 31G/8MM 31G X 8 MM MISC    USE AS DIRECTED TWICE DAILY   SOLIFENACIN  (VESICARE ) 5 MG TABLET    TAKE 1 TABLET (5 MG TOTAL) BY MOUTH DAILY.   TAMSULOSIN  (FLOMAX ) 0.4 MG CAPS CAPSULE    TAKE 1 CAPSULE BY MOUTH EVERY DAY   TRAMADOL  (ULTRAM ) 50 MG TABLET    Take 1 tablet (50 mg total) by mouth every 6 (six) hours as needed.  Modified Medications   No medications on file  Discontinued Medications   DULAGLUTIDE  (TRULICITY ) 3 MG/0.5ML SOAJ    Inject 3 mg as directed once a week.    Allergies No Known Allergies  Past Medical History Past Medical History:  Diagnosis Date   Chicken pox    Diabetes mellitus without complication (HCC)    Essential hypertension    GERD (gastroesophageal reflux disease)    Gouty arthritis    Hypertension    Iron  deficiency anemia    Vitamin D  deficiency     Past Surgical History Past Surgical History:  Procedure Laterality Date   BIOPSY  12/05/2021   Procedure: BIOPSY;  Surgeon: Legrand Victory LITTIE DOUGLAS, MD;  Location: [facility];  Service: Gastroenterology;;  ESOPHAGOGASTRODUODENOSCOPY N/A 12/05/2021   Procedure: ESOPHAGOGASTRODUODENOSCOPY (EGD);  Surgeon: Legrand Victory LITTIE DOUGLAS, MD;  Location: [facility];  Service:  Gastroenterology;  Laterality: N/A;   HOT HEMOSTASIS N/A 12/05/2021   Procedure: HOT HEMOSTASIS (ARGON PLASMA COAGULATION/BICAP);  Surgeon: Legrand Victory LITTIE DOUGLAS, MD;  Location: [facility];  Service: Gastroenterology;  Laterality: N/A;   INGUINAL HERNIA REPAIR Right 12/19/2017   Procedure: HERNIA REPAIR INGUINAL RIGHT;  Surgeon: Vernetta Berg, MD;  Location: [facility];  Service: General;  Laterality: Right;   INSERTION OF MESH Right 12/19/2017   Procedure: INSERTION OF MESH;  Surgeon: Vernetta Berg, MD;  Location: [facility];  Service: General;  Laterality: Right;   MASS EXCISION Right 12/19/2017   Procedure: EXCISION OF RIGHT SCROTAL MASS;  Surgeon: Vernetta Berg, MD;  Location: [facility];  Service: General;  Laterality: Right;   TONSILLECTOMY      Family History family history includes Diabetes in his mother; Heart disease in his mother; Hypertension in his father and mother; Stroke in his brother and father.  Social History Social History   Socioeconomic History   Marital status: Divorced    Spouse name: Not on file   Number of children: 1   Years of education: 12   Highest education level: 12th grade  Occupational History   Occupation: retired  Tobacco Use   Smoking status: Former    Current packs/day: 2.00    Average packs/day: 2.0 packs/day for 25.0 years (50.0 ttl pk-yrs)    Types: Cigarettes   Smokeless tobacco: Former    Types: Engineer, drilling   Vaping status: Never Used  Substance and Sexual Activity   Alcohol use: Not Currently   Drug use: No   Sexual activity: Yes    Birth control/protection: Condom  Other Topics Concern   Not on file  Social History Narrative   Fun: Just working   Social Drivers of Corporate investment banker Strain: Low Risk  (05/16/2023)   Overall Financial Resource Strain (CARDIA)    Difficulty of Paying Living Expenses: Not very hard  Recent Concern: Financial Resource Strain - Medium Risk (04/07/2023)   Overall Financial Resource  Strain (CARDIA)    Difficulty of Paying Living Expenses: Somewhat hard  Food Insecurity: No Food Insecurity (05/16/2023)   Hunger Vital Sign    Worried About Running Out of Food in the Last Year: Never true    Ran Out of Food in the Last Year: Never true  Recent Concern: Food Insecurity - Food Insecurity Present (04/07/2023)   Hunger Vital Sign    Worried About Running Out of Food in the Last Year: Sometimes true    Ran Out of Food in the Last Year: Sometimes true  Transportation Needs: No Transportation Needs (05/16/2023)   PRAPARE - Administrator, Civil Service (Medical): No    Lack of Transportation (Non-Medical): No  Physical Activity: Insufficiently Active (05/16/2023)   Exercise Vital Sign    Days of Exercise per Week: 2 days    Minutes of Exercise per Session: 10 min  Stress: No Stress Concern Present (05/16/2023)   [employer] of Occupational Health - Occupational Stress Questionnaire    Feeling of Stress : Not at all  Social Connections: Moderately Isolated (05/16/2023)   Social Connection and Isolation Panel    Frequency of Communication with Friends and Family: More than three times a week    Frequency of Social Gatherings with Friends and Family: Once a week    Attends Religious  Services: 1 to 4 times per year    Active Member of Clubs or Organizations: No    Attends Banker Meetings: Not on file    Marital Status: Divorced  Intimate Partner Violence: Not on file    Lab Results  Component Value Date   HGBA1C 7.7 (A) 01/01/2024   HGBA1C 8.8 (H) 10/24/2023   HGBA1C 7.6 (A) 09/03/2023   Lab Results  Component Value Date   CHOL 140 04/09/2023   Lab Results  Component Value Date   HDL 32.00 (L) 04/09/2023   Lab Results  Component Value Date   LDLCALC 59 04/09/2023   Lab Results  Component Value Date   TRIG 246.0 (H) 04/09/2023   Lab Results  Component Value Date   CHOLHDL 4 04/09/2023   Lab Results  Component Value Date    CREATININE 1.55 (H) 10/24/2023   Lab Results  Component Value Date   GFR 46.75 (L) 10/24/2023   Lab Results  Component Value Date   MICROALBUR <0.2 10/02/2023      Component Value Date/Time   NA 126 (L) 10/24/2023 1452   K 4.4 10/24/2023 1452   CL 89 (L) 10/24/2023 1452   CO2 27 10/24/2023 1452   GLUCOSE 207 (H) 10/24/2023 1452   BUN 33 (H) 10/24/2023 1452   CREATININE 1.55 (H) 10/24/2023 1452   CALCIUM 9.8 10/24/2023 1452   PROT 8.3 10/24/2023 1452   ALBUMIN 4.5 10/24/2023 1452   AST 14 10/24/2023 1452   ALT 28 10/24/2023 1452   ALKPHOS 90 10/24/2023 1452   BILITOT 0.7 10/24/2023 1452   GFRNONAA 54 (L) 10/18/2023 1930   GFRAA >60 01/30/2018 2037      Latest Ref Rng & Units 10/24/2023    2:52 PM 10/18/2023    7:30 PM 06/04/2023    4:03 PM  BMP  Glucose 70 - 99 mg/dL 792  857  86   BUN 6 - 23 mg/dL 33  21  35   Creatinine 0.40 - 1.50 mg/dL 8.44  8.55  8.73   Sodium 135 - 145 mEq/L 126  135  133   Potassium 3.5 - 5.1 mEq/L 4.4  4.6  4.0   Chloride 96 - 112 mEq/L 89  100  100   CO2 19 - 32 mEq/L 27  23  22    Calcium 8.4 - 10.5 mg/dL 9.8  9.9  9.7        Component Value Date/Time   WBC 10.1 10/24/2023 1452   RBC 5.54 10/24/2023 1452   HGB 13.9 10/24/2023 1452   HCT 43.4 10/24/2023 1452   PLT 416.0 (H) 10/24/2023 1452   MCV 78.4 10/24/2023 1452   MCH 25.0 (L) 10/18/2023 1930   MCHC 32.1 10/24/2023 1452   RDW 17.9 (H) 10/24/2023 1452   LYMPHSABS 2.3 10/24/2023 1452   MONOABS 1.0 10/24/2023 1452   EOSABS 0.3 10/24/2023 1452   BASOSABS 0.0 10/24/2023 1452     Parts of this note may have been dictated using voice recognition software. There may be variances in spelling and vocabulary which are unintentional. Not all errors are proofread. Please notify the dino if any discrepancies are noted or if the meaning of any statement is not clear.

## 2024-01-30 NOTE — Patient Instructions (Signed)
 Currently on: Trulicity  4.5 mg/week  Farxiga  10 mg daily, metformin  1 g twice daily, glipizide  XL 10 mg bid  NPH 38-40 units before coffee at 6 am and 34-35 units at night a day (take 15 min before break fast and dinner) Humalog  kwikpen correction scale: Use as needed based on blood sugars as follows: 151 - 175: 1 unit 176 - 200: 2 units 201 - 225: 3 units 226 - 250: 4 units 251 - 275: 5 units 276 - 300: 6 units 301 - 325: 7 units 326 - 350: 8 units 351 - 375: 9 units 376 - 400: 10 units

## 2024-01-31 ENCOUNTER — Encounter: Payer: Self-pay | Admitting: "Endocrinology

## 2024-02-11 ENCOUNTER — Other Ambulatory Visit: Payer: Self-pay | Admitting: "Endocrinology

## 2024-02-12 ENCOUNTER — Telehealth: Payer: Self-pay | Admitting: Radiology

## 2024-02-12 ENCOUNTER — Other Ambulatory Visit: Payer: Self-pay

## 2024-02-12 DIAGNOSIS — E1165 Type 2 diabetes mellitus with hyperglycemia: Secondary | ICD-10-CM

## 2024-02-12 MED ORDER — RELION INSULIN SYRINGE 31G X 15/64" 0.5 ML MISC
3 refills | Status: AC
Start: 2024-02-12 — End: ?

## 2024-02-12 NOTE — Telephone Encounter (Signed)
 Copied from CRM [id number]. Topic: Clinical - Medication Question >> Feb 12, 2024 12:29 PM Steven [name] wrote: Reason for CRM: Patient called in regarding needing to speak with Steven [name] regarding his medication, would like for him to give him a callback

## 2024-02-14 ENCOUNTER — Encounter: Attending: "Endocrinology | Admitting: Dietician

## 2024-02-14 ENCOUNTER — Encounter: Payer: Self-pay | Admitting: Dietician

## 2024-02-14 DIAGNOSIS — E1142 Type 2 diabetes mellitus with diabetic polyneuropathy: Secondary | ICD-10-CM | POA: Insufficient documentation

## 2024-02-14 NOTE — Patient Instructions (Signed)
 FRIO cooler bag for insulin  (consider getting this to be sure your insulin  does not get hot this summer)   Continue treatment for any hypoglycemia.  Continue to stay active  Be mindful about your meal and drink choices.

## 2024-02-14 NOTE — Progress Notes (Signed)
 Patient is here today alone.  He was last seen by this RD on 11/08/2023.  Forgets to take his meal time insulin  until after he eats (particularly for dinner).   He doesn't like to bring his insulin  with him due to it being too hot. Reports taking his other medication.  Diabetes Self-Management Education  Visit Type: Follow-up  Appt. Start Time: 0904 Appt. End Time: 0934  02/14/2024  [name], identified by name and date of birth, is a 63 y.o. male with a diagnosis of Diabetes:  .   ASSESSMENT Patient is here today alone.  He was last seen by this RD on 11/08/2023.  Forgets to take his meal time insulin  until after he eats (particularly for dinner).   He doesn't like to bring his insulin  with him due to it being too hot. Reports taking his other medication. States that he needs a knee replacement. He is drinking regular soda but states that it doesn't affect his blood glucose.  (Confirmed on review of CGM.  Discussed that it still is best to drink water.  Patient is not ready to change.)   History includes:  Type 2 Diabetes, HTN, history of vitamin D  deficiency, iron  deficient anemia Blood Glucose Meter:  Relion Premier Medications include Farxiga , Metformin , Glipizide , Trulicity , Humulin  N 40 q am and 35 q HS, Humalog  per sliding scale, iron , Vitamin D , Vitamin B Last A1C 7.7% 01/01/2024, 8.8% 10/24/2023 increased from 7.6% 09/03/2023, c-peptide 7.02 on 04/20/2023, eGFR 60 06/04/2023, Vitamin D  120 on 04/09/23, Vitamin B-12 331 09/19/2022 CGM:  Dexcom G7 -    CGM Results from download: 11/08/2023 02/14/2024  % Time CGM active:   85 %   (Goal >70%) 94  Average glucose:   112 mg/dL for 14 days [id number]  Glucose management indicator:   6 % 7.3%  Time in range (70-180 mg/dL):   85 %   (Goal >29%) 62  Time High (181-250 mg/dL):   5 %   (Goal < 74%) 30  Time Very High (>250 mg/dL):    0 %   (Goal < 5%) 8  Time Low (54-69 mg/dL):   6 %   (Goal <5%) 0  Time Very Low (<54 mg/dL):   4 %    (Goal <8%) <1  %CV (glucose variability)    31.6 %  (Goal <36%) 31.6    68 233 lbs 02/14/2024 240 lbs 11/08/2023 243 lbs 08/09/2023 232 lbs 2021   Social hx:  Intermittently homeless.  No refrigerator and no stove where he lives.  He can reheat his food. He works Holiday representative at times. He has a car  There were no vitals taken for this visit. There is no height or weight on file to calculate BMI.   Diabetes Self-Management Education - 02/14/24 0935       Visit Information   Visit Type Follow-up      Health Coping   How would you rate your overall health? Good      Psychosocial Assessment   Patient Belief/Attitude about Diabetes Motivated to manage diabetes   but many barriers   What is the hardest part about your diabetes right now, causing you the most concern, or is the most worrisome to you about your diabetes?   Taking/obtaining medications    Self-care barriers Lack of material resources    Self-management support Doctor's office;CDE visits    Other persons present Patient    Patient Concerns Nutrition/Meal planning    Special Needs None  Preferred Learning Style No preference indicated    Learning Readiness Ready      Pre-Education Assessment   Patient understands the diabetes disease and treatment process. Needs Review    Patient understands incorporating nutritional management into lifestyle. Needs Review    Patient undertands incorporating physical activity into lifestyle. Needs Review    Patient understands using medications safely. Needs Review    Patient understands monitoring blood glucose, interpreting and using results Needs Review    Patient understands prevention, detection, and treatment of acute complications. Needs Review    Patient understands prevention, detection, and treatment of chronic complications. Needs Review    Patient understands how to develop strategies to address psychosocial issues. Needs Review    Patient understands how to develop  strategies to promote health/change behavior. Needs Review      Complications   Last HgB A1C per patient/outside source 7.7 %   01/01/2024 decreased from 8.8% 10/24/2023   How often do you check your blood sugar? 3-4 times/day    Fasting Blood glucose range (mg/dL) >799;29-870;869-820;819-799    Postprandial Blood glucose range (mg/dL) >799;819-799;869-820    Number of hypoglycemic episodes per month 2    Can you tell when your blood sugar is low? Yes    What do you do if your blood sugar is low? drink juice    Number of hyperglycemic episodes ( >200mg /dL): Weekly    Can you tell when your blood sugar is high? Yes    Have you had a dilated eye exam in the past 12 months? Yes    Are you checking your feet? Yes      Dietary Intake   Breakfast coffee with powdered creamer, 2 baked chicken legs, pinto beans, cabbage    Snack (morning) none    Lunch none (not hungry)    Snack (afternoon) occasional Nabs    Dinner Double Audiological scientist, regular coke    Snack (evening) cookies    Beverage(s) water, coffee with powdered creamer, G2 gatorade, sweet tea or regular coke      Activity / Exercise   Activity / Exercise Type Light (walking / raking leaves)    How many days per week do you exercise? 5    How many minutes per day do you exercise? 30    Total minutes per week of exercise 150      Patient Education   Previous Diabetes Education Yes   10/2023   Healthy Eating Meal options for control of blood glucose level and chronic complications.;Role of diet in the treatment of diabetes and the relationship between the three main macronutrients and blood glucose level    Being Active Role of exercise on diabetes management, blood pressure control and cardiac health.    Medications Reviewed patients medication for diabetes, action, purpose, timing of dose and side effects.    Monitoring Taught/evaluated CGM (comment)    Acute complications Discussed and identified patients' prevention, symptoms, and  treatment of hyperglycemia.    Chronic complications Relationship between chronic complications and blood glucose control    Diabetes Stress and Support Identified and addressed patients feelings and concerns about diabetes;Worked with patient to identify barriers to care and solutions      Individualized Goals (developed by patient)   Nutrition General guidelines for healthy choices and portions discussed    Physical Activity Exercise 5-7 days per week;30 minutes per day    Medications take my medication as prescribed    Monitoring  Test my blood glucose as discussed  Problem Solving Eating Pattern;Addressing barriers to behavior change    Reducing Risk examine blood glucose patterns;treat hypoglycemia with 15 grams of carbs if blood glucose less than 70mg /dL      Patient Self-Evaluation of Goals - Patient rates self as meeting previously set goals (% of time)   Nutrition 50 - 75 % (half of the time)    Physical Activity >75% (most of the time)    Medications >75% (most of the time)    Monitoring >75% (most of the time)    Problem Solving and behavior change strategies  >75% (most of the time)    Reducing Risk (treating acute and chronic complications) 50 - 75 % (half of the time)    Health Coping 50 - 75 % (half of the time)      Post-Education Assessment   Patient understands the diabetes disease and treatment process. Demonstrates understanding / competency    Patient understands incorporating nutritional management into lifestyle. Comprehends key points    Patient undertands incorporating physical activity into lifestyle. Demonstrates understanding / competency    Patient understands using medications safely. Demonstrates understanding / competency    Patient understands monitoring blood glucose, interpreting and using results Demonstrates understanding / competency    Patient understands prevention, detection, and treatment of acute complications. Demonstrates understanding /  competency    Patient understands prevention, detection, and treatment of chronic complications. Demonstrates understanding / competency    Patient understands how to develop strategies to address psychosocial issues. Demonstrates understanding / competency    Patient understands how to develop strategies to promote health/change behavior. Comprehends key points      Outcomes   Expected Outcomes Demonstrated interest in learning. Expect positive outcomes    Future DMSE 3-4 months    Program Status Not Completed      Subsequent Visit   Since your last visit have you continued or begun to take your medications as prescribed? Yes    Since your last visit have you experienced any weight changes? Loss    Weight Loss (lbs) 7    Since your last visit, are you checking your blood glucose at least once a day? Yes          Individualized Plan for Diabetes Self-Management Training:   Learning Objective:  Patient will have a greater understanding of diabetes self-management. Patient education plan is to attend individual and/or group sessions per assessed needs and concerns.   Plan:   Patient Instructions   FRIO cooler bag for insulin  (consider getting this to be sure your insulin  does not get hot this summer)   Continue treatment for any hypoglycemia.  Continue to stay active  Be mindful about your meal and drink choices.      Expected Outcomes:  Demonstrated interest in learning. Expect positive outcomes  Education material provided:   If problems or questions, patient to contact team via:  Phone  Future DSME appointment: 3-4 months

## 2024-02-18 ENCOUNTER — Other Ambulatory Visit: Payer: Self-pay | Admitting: "Endocrinology

## 2024-02-18 DIAGNOSIS — E1165 Type 2 diabetes mellitus with hyperglycemia: Secondary | ICD-10-CM

## 2024-02-20 NOTE — Telephone Encounter (Signed)
 Called and spoke with patient. Patient had concerns with what new pain medication regiment was. He believed he was still taking the cyclobenzaprine  and percocet. Informed him that during last visit with Dr.John we had switched him from flexeril  to the robaxin , and the percocet would be held as he we now had him taking tramadol . Patient expressed understanding

## 2024-02-21 ENCOUNTER — Ambulatory Visit (INDEPENDENT_AMBULATORY_CARE_PROVIDER_SITE_OTHER): Admitting: Internal Medicine

## 2024-02-21 ENCOUNTER — Encounter: Payer: Self-pay | Admitting: Internal Medicine

## 2024-02-21 ENCOUNTER — Ambulatory Visit: Payer: Self-pay | Admitting: Internal Medicine

## 2024-02-21 VITALS — BP 130/72 | HR 74 | Temp 98.2°F | Ht 68.0 in | Wt 231.6 lb

## 2024-02-21 DIAGNOSIS — R1310 Dysphagia, unspecified: Secondary | ICD-10-CM | POA: Diagnosis not present

## 2024-02-21 DIAGNOSIS — Z7985 Long-term (current) use of injectable non-insulin antidiabetic drugs: Secondary | ICD-10-CM

## 2024-02-21 DIAGNOSIS — Z794 Long term (current) use of insulin: Secondary | ICD-10-CM

## 2024-02-21 DIAGNOSIS — D509 Iron deficiency anemia, unspecified: Secondary | ICD-10-CM

## 2024-02-21 DIAGNOSIS — E1142 Type 2 diabetes mellitus with diabetic polyneuropathy: Secondary | ICD-10-CM | POA: Diagnosis not present

## 2024-02-21 DIAGNOSIS — E871 Hypo-osmolality and hyponatremia: Secondary | ICD-10-CM | POA: Diagnosis not present

## 2024-02-21 DIAGNOSIS — Z7984 Long term (current) use of oral hypoglycemic drugs: Secondary | ICD-10-CM

## 2024-02-21 LAB — LIPID PANEL
Cholesterol: 129 mg/dL (ref 0–200)
HDL: 31.5 mg/dL — ABNORMAL LOW (ref >39.00)
LDL Cholesterol: 34 mg/dL (ref 0–99)
NonHDL: 97.5
Total CHOL/HDL Ratio: 4
Triglycerides: 317 mg/dL — ABNORMAL HIGH (ref 0.0–149.0)
VLDL: 63.4 mg/dL — ABNORMAL HIGH (ref 0.0–40.0)

## 2024-02-21 LAB — URINALYSIS, ROUTINE W REFLEX MICROSCOPIC
Bilirubin Urine: NEGATIVE
Hgb urine dipstick: NEGATIVE
Ketones, ur: NEGATIVE
Leukocytes,Ua: NEGATIVE
Nitrite: NEGATIVE
RBC / HPF: NONE SEEN (ref 0–?)
Specific Gravity, Urine: 1.015 (ref 1.000–1.030)
Total Protein, Urine: NEGATIVE
Urine Glucose: >=1000 — AB
Urobilinogen, UA: 0.2 (ref 0.0–1.0)
WBC, UA: NONE SEEN (ref 0–?)
pH: 6 (ref 5.0–8.0)

## 2024-02-21 LAB — CBC WITH DIFFERENTIAL/PLATELET
Basophils Absolute: 0 K/uL (ref 0.0–0.1)
Basophils Relative: 0.3 % (ref 0.0–3.0)
Eosinophils Absolute: 0.2 K/uL (ref 0.0–0.7)
Eosinophils Relative: 3.3 % (ref 0.0–5.0)
HCT: 41.7 % (ref 39.0–52.0)
Hemoglobin: 13.3 g/dL (ref 13.0–17.0)
Lymphocytes Relative: 37.9 % (ref 12.0–46.0)
Lymphs Abs: 2.8 K/uL (ref 0.7–4.0)
MCHC: 31.8 g/dL (ref 30.0–36.0)
MCV: 80.1 fl (ref 78.0–100.0)
Monocytes Absolute: 0.5 K/uL (ref 0.1–1.0)
Monocytes Relative: 6.7 % (ref 3.0–12.0)
Neutro Abs: 3.8 K/uL (ref 1.4–7.7)
Neutrophils Relative %: 51.8 % (ref 43.0–77.0)
Platelets: 349 K/uL (ref 150.0–400.0)
RBC: 5.2 Mil/uL (ref 4.22–5.81)
RDW: 19.7 % — ABNORMAL HIGH (ref 11.5–15.5)
WBC: 7.3 K/uL (ref 4.0–10.5)

## 2024-02-21 LAB — BASIC METABOLIC PANEL WITH GFR
BUN: 16 mg/dL (ref 6–23)
CO2: 24 meq/L (ref 19–32)
Calcium: 9.6 mg/dL (ref 8.4–10.5)
Chloride: 102 meq/L (ref 96–112)
Creatinine, Ser: 1.23 mg/dL (ref 0.40–1.50)
GFR: 61.55 mL/min (ref >60.00)
Glucose, Bld: 236 mg/dL — ABNORMAL HIGH (ref 70–99)
Potassium: 4 meq/L (ref 3.5–5.1)
Sodium: 137 meq/L (ref 135–145)

## 2024-02-21 LAB — HEPATIC FUNCTION PANEL
ALT: 29 U/L (ref 0–53)
AST: 16 U/L (ref 0–37)
Albumin: 4.4 g/dL (ref 3.5–5.2)
Alkaline Phosphatase: 67 U/L (ref 39–117)
Bilirubin, Direct: 0.1 mg/dL (ref 0.0–0.3)
Total Bilirubin: 0.3 mg/dL (ref 0.2–1.2)
Total Protein: 7.4 g/dL (ref 6.0–8.3)

## 2024-02-21 LAB — IBC PANEL
Iron: 56 ug/dL (ref 42–165)
Saturation Ratios: 15.7 % — ABNORMAL LOW (ref 20.0–50.0)
TIBC: 357 ug/dL (ref 250.0–450.0)
Transferrin: 255 mg/dL (ref 212.0–360.0)

## 2024-02-21 LAB — TSH: TSH: 2.06 u[IU]/mL (ref 0.35–5.50)

## 2024-02-21 LAB — FERRITIN: Ferritin: 101.8 ng/mL (ref 22.0–322.0)

## 2024-02-21 NOTE — Assessment & Plan Note (Signed)
 S/p egd and colonoscopy and camera 2023, now recurrend, pt to continue oral iron  and f/u GI as planned, for iron  with labs today

## 2024-02-21 NOTE — Patient Instructions (Addendum)
 Please continue all other medications as before, and refills have been done if requested.  Please have the pharmacy call with any other refills you may need.  Please continue your efforts at being more active, low cholesterol diet, and weight control.  You are otherwise up to date with prevention measures today.  Please keep your appointments with your specialists as you may have planned  You will be contacted regarding the referral for: Gastroenterology  Please go to the LAB at the blood drawing area for the tests to be done  You will be contacted by phone if any changes need to be made immediately.  Otherwise, you will receive a letter about your results with an explanation, but please check with MyChart first.  Please make an Appointment to return in 6 months, or sooner if needed

## 2024-02-21 NOTE — Progress Notes (Signed)
 [facility] [address] [place] [address] Phone: [phone] Subjective:   ISusannah Gully, am serving as a scribe for Dr. Arthea Claudene.  I'm seeing this patient by the request  of:  Norleen Lynwood ORN, MD  CC: Hand pain, back pain  YEP:Dlagzrupcz  11/26/2023 Given injection and tolerated the procedure well, discussed icing regimen of home exercises, discussed which activities to do and which ones to avoid.  Follow-up again 2 to 3 months     I believe more acute on chronic.  Likely does have some underlying osteoarthritic changes that is contributing to potentially nerve root impingement.  We discussed icing regimen and home exercises, discussed which activities to do and which ones to avoid.  Increasing activity slowly.  Patient has failed all other conservative therapy and pain in the left testicle is concerning for more radicular symptoms with further workup in the emergency room being unremarkable.  Concerned that this is more radicular symptoms and causing difficulty with sleep, and activities of daily living.  Discussed with patient about icing regimen of home exercises, discussed which activities to do and which ones to avoid otherwise.  Increase activity slowly.  Discussed icing regimen.  Follow-up with me again after imaging of the MRI of the lumbar spine.  Could be candidate for potential injections such as epidurals or nerve root injections.        Update 02/26/2024 Jhamari Markowicz is a 63 y.o. male coming in with complaint of L hand, middle finger and lumbar spine pain. Patient states talk about MRI results. The R hand fingers are sticking. B knee pain today as well.      Past Medical History:  Diagnosis Date   Chicken pox    Diabetes mellitus without complication (HCC)    Essential hypertension    GERD (gastroesophageal reflux disease)    Gouty arthritis    Hypertension    Iron  deficiency anemia    Vitamin D  deficiency    Past Surgical  History:  Procedure Laterality Date   BIOPSY  12/05/2021   Procedure: BIOPSY;  Surgeon: Legrand Victory LITTIE DOUGLAS, MD;  Location: [facility];  Service: Gastroenterology;;   ESOPHAGOGASTRODUODENOSCOPY N/A 12/05/2021   Procedure: ESOPHAGOGASTRODUODENOSCOPY (EGD);  Surgeon: Legrand Victory LITTIE DOUGLAS, MD;  Location: [facility];  Service: Gastroenterology;  Laterality: N/A;   HOT HEMOSTASIS N/A 12/05/2021   Procedure: HOT HEMOSTASIS (ARGON PLASMA COAGULATION/BICAP);  Surgeon: Legrand Victory LITTIE DOUGLAS, MD;  Location: [facility];  Service: Gastroenterology;  Laterality: N/A;   INGUINAL HERNIA REPAIR Right 12/19/2017   Procedure: HERNIA REPAIR INGUINAL RIGHT;  Surgeon: Vernetta Berg, MD;  Location: [facility];  Service: General;  Laterality: Right;   INSERTION OF MESH Right 12/19/2017   Procedure: INSERTION OF MESH;  Surgeon: Vernetta Berg, MD;  Location: [facility];  Service: General;  Laterality: Right;   MASS EXCISION Right 12/19/2017   Procedure: EXCISION OF RIGHT SCROTAL MASS;  Surgeon: Vernetta Berg, MD;  Location: [facility];  Service: General;  Laterality: Right;   TONSILLECTOMY     Social History   Socioeconomic History   Marital status: Divorced    Spouse name: Not on file   Number of children: 1   Years of education: 12   Highest education level: 12th grade  Occupational History   Occupation: retired  Tobacco Use   Smoking status: Former    Current packs/day: 2.00    Average packs/day: 2.0 packs/day for 25.0 years (50.0 ttl pk-yrs)  Types: Cigarettes   Smokeless tobacco: Former    Types: Engineer, drilling   Vaping status: Never Used  Substance and Sexual Activity   Alcohol use: Not Currently   Drug use: No   Sexual activity: Yes    Birth control/protection: Condom  Other Topics Concern   Not on file  Social History Narrative   Fun: Just working   Social Drivers of Corporate investment banker Strain: Low Risk  (05/16/2023)   Overall Financial Resource Strain (CARDIA)    Difficulty of  Paying Living Expenses: Not very hard  Recent Concern: Financial Resource Strain - Medium Risk (04/07/2023)   Overall Financial Resource Strain (CARDIA)    Difficulty of Paying Living Expenses: Somewhat hard  Food Insecurity: No Food Insecurity (05/16/2023)   Hunger Vital Sign    Worried About Running Out of Food in the Last Year: Never true    Ran Out of Food in the Last Year: Never true  Recent Concern: Food Insecurity - Food Insecurity Present (04/07/2023)   Hunger Vital Sign    Worried About Running Out of Food in the Last Year: Sometimes true    Ran Out of Food in the Last Year: Sometimes true  Transportation Needs: No Transportation Needs (05/16/2023)   PRAPARE - Administrator, Civil Service (Medical): No    Lack of Transportation (Non-Medical): No  Physical Activity: Insufficiently Active (05/16/2023)   Exercise Vital Sign    Days of Exercise per Week: 2 days    Minutes of Exercise per Session: 10 min  Stress: No Stress Concern Present (05/16/2023)   [employer] of Occupational Health - Occupational Stress Questionnaire    Feeling of Stress : Not at all  Social Connections: Moderately Isolated (05/16/2023)   Social Connection and Isolation Panel    Frequency of Communication with Friends and Family: More than three times a week    Frequency of Social Gatherings with Friends and Family: Once a week    Attends Religious Services: 1 to 4 times per year    Active Member of [employer] or Organizations: No    Attends Engineer, structural: Not on file    Marital Status: Divorced   No Known Allergies Family History  Problem Relation Age of Onset   Heart disease Mother    Hypertension Mother    Diabetes Mother    Stroke Father    Hypertension Father    Stroke Brother     Current Outpatient Medications (Endocrine & Metabolic):    dapagliflozin  propanediol (FARXIGA ) 10 MG TABS tablet, Take 1 tablet (10 mg total) by mouth daily.   Dulaglutide  (TRULICITY )  4.5 MG/0.5ML SOAJ, Inject 4.5 mg as directed once a week.   glipiZIDE  (GLUCOTROL  XL) 10 MG 24 hr tablet, Take 1 tablet (10 mg total) by mouth 2 (two) times daily.   HUMALOG  KWIKPEN 100 UNIT/ML KwikPen, INJECT 22 UNITS AFTER BREAKFAST AND 18 UNITS AFTER DINNER DAILY.   HUMULIN  N 100 UNIT/ML injection, Inject up to 30 units in morning and 40 units at bedtime   insulin  aspart (NOVOLOG  FLEXPEN) 100 UNIT/ML FlexPen, Inject 25 Units into the skin daily before breakfast.   metFORMIN  (GLUCOPHAGE ) 1000 MG tablet, TAKE 1 TABLET BY MOUTH TWICE A DAY WITH FOOD  Current Outpatient Medications (Cardiovascular):    hydrochlorothiazide  (HYDRODIURIL ) 25 MG tablet, TAKE 1 TABLET BY MOUTH EVERY DAY   lisinopril  (ZESTRIL ) 5 MG tablet, Take 5 mg by mouth daily.   lovastatin  (MEVACOR ) 40 MG  tablet, TAKE 1 TABLET BY MOUTH EVERYDAY AT BEDTIME   Current Outpatient Medications (Analgesics):    allopurinol  (ZYLOPRIM ) 100 MG tablet, Take 1 tablet (100 mg total) by mouth 2 (two) times daily.   ibuprofen  (ADVIL ) 600 MG tablet, Take 600 mg by mouth every 6 (six) hours as needed.   meloxicam  (MOBIC ) 15 MG tablet, TAKE 1 TABLET BY MOUTH ONCE DAILY AS NEEDED FOR PAIN   traMADol  (ULTRAM ) 50 MG tablet, Take 1 tablet (50 mg total) by mouth every 6 (six) hours as needed.  Current Outpatient Medications (Hematological):    ferrous sulfate  324 (65 Fe) MG TBEC, Take 1 tablet (325 mg total) by mouth daily.   iron  polysaccharides (NU-IRON ) 150 MG capsule, Take 1 capsule (150 mg total) by mouth daily.  Current Outpatient Medications (Other):    Continuous Glucose Sensor (DEXCOM G7 SENSOR) MISC, 1 Device by Does not apply route as directed. Change sensor every 10 days   diclofenac  Sodium (VOLTAREN ) 1 % GEL, Apply 2 g topically 4 (four) times daily.   gabapentin  (NEURONTIN ) 100 MG capsule, Take 2 capsules (200 mg total) by mouth at bedtime.   Insulin  Syringe-Needle U-100 (RELION INSULIN  SYRINGE) 31G X 15/64 0.5 ML MISC, Use to inject  insulin  twice a day.   methocarbamol  (ROBAXIN ) 500 MG tablet, Take 1 tablet (500 mg total) by mouth 4 (four) times daily.   neomycin -polymyxin-hydrocortisone (CORTISPORIN) 3.5-10000-1 OTIC suspension, Apply 1-2 drops daily after soaking and cover with bandaid   Ostomy Supplies (SKIN TAC ADHESIVE BARRIER WIPE) MISC, Use to help apply Dexcom sensor every 10 days   OVER THE COUNTER MEDICATION, Take 1 tablet by mouth 2 (two) times daily. Beet Root supplement   pantoprazole  (PROTONIX ) 40 MG tablet, Take 1 tablet (40 mg total) by mouth daily.   RELION PEN NEEDLE 31G/8MM 31G X 8 MM MISC, USE AS DIRECTED TWICE DAILY   solifenacin  (VESICARE ) 5 MG tablet, TAKE 1 TABLET (5 MG TOTAL) BY MOUTH DAILY.   tamsulosin  (FLOMAX ) 0.4 MG CAPS capsule, TAKE 1 CAPSULE BY MOUTH EVERY DAY   Reviewed prior external information including notes and imaging from  primary care provider As well as notes that were available from care everywhere and other healthcare systems.  Past medical history, social, surgical and family history all reviewed in electronic medical record.  No pertanent information unless stated regarding to the chief complaint.   Review of Systems:  No headache, visual changes, nausea, vomiting, diarrhea, constipation, dizziness, abdominal pain, skin rash, fevers, chills, night sweats, weight loss, swollen lymph nodes, body aches, joint swelling, chest pain, shortness of breath, mood changes. POSITIVE muscle aches  Objective  Blood pressure 130/70, pulse 95, height 5' 8 (1.727 m), SpO2 96%.   General: No apparent distress alert and oriented x3 mood and affect normal, dressed appropriately.  HEENT: Pupils equal, extraocular movements intact  Respiratory: Patient's speak in full sentences and does not appear short of breath  Cardiovascular: No lower extremity edema, non tender, no erythema  Antalgic gait noted.  Patient does favor the right knee fairly significantly.  Varus deformity of the knee noted.   Instability with valgus and varus force. Patient back does have significant loss of lordosis. Tender to palpation in the hand especially with a positive Tinel's noted.  Procedure: Real-time Ultrasound Guided Injection of right carpal tunnel Device: GE Logiq Q7  Ultrasound guided injection is preferred based studies that show increased duration, increased effect, greater accuracy, decreased procedural pain, increased response rate with ultrasound guided versus  blind injection.  Verbal informed consent obtained.  Time-out conducted.  Noted no overlying erythema, induration, or other signs of local infection.  Skin prepped in a sterile fashion.  Local anesthesia: Topical Ethyl chloride.  With sterile technique and under real time ultrasound guidance:  median nerve visualized.  23g 5/8 inch needle inserted distal to proximal approach into nerve sheath. Pictures taken nfor needle placement. Patient did have injection of 0.5 cc of 0.5% Marcaine , and 0.5 cc of Kenalog  40 mg/dL. Completed without difficulty  Pain immediately resolved suggesting accurate placement of the medication.  Advised to call if fevers/chills, erythema, induration, drainage, or persistent bleeding.  Images saved Impression: Technically successful ultrasound guided injection.  After informed written and verbal consent, patient was seated on exam table. Right knee was prepped with alcohol swab and utilizing anterolateral approach, patient's right knee space was injected with 4:1  marcaine  0.5%: Kenalog  40mg /dL. Patient tolerated the procedure well without immediate complications.      Impression and Recommendations:     The above documentation has been reviewed and is accurate and complete Kaydan Wilhoite M Triston Lisanti, DO

## 2024-02-21 NOTE — Progress Notes (Signed)
 Patient ID: Steven [name], male   DOB: 26-Jun-1960, 63 y.o.   MRN: [id number]        Chief Complaint: follow up fatigue, hyponatremia, renal insufficiency, iron  deficiency and dysphagia       HPI:  Steven [name] is a 63 y.o. male here with c/o fatigue and just not feeling well, though Pt denies chest pain, wheezing, orthopnea, PND, increased LE swelling, palpitations, dizziness or syncope.  Does have chronic sob.   Pt denies polydipsia, polyuria, or new focal neuro s/s. But has been more gatorade recently with mild worsening renal insufficiency may 2025.    Did have sodium worse than baseline at 126 last visit, but has not f/u lab as requested. Has appt to see Sports Med sept 30 with left leg pain and weakness x 3 mo.  Taking trulicity  with some success at wt loss with reduced appetite over 10 lbs so far.  Has hx of dysphagia may 2025 for unclear reason now seems improved.  Also hx of iron  deficiency -, no overt bleeding.   Has not yet f/u with GI.    Wt Readings from Last 3 Encounters:  02/21/24 231 lb 9.6 oz (105.1 kg)  01/30/24 235 lb (106.6 kg)  01/01/24 235 lb (106.6 kg)   BP Readings from Last 3 Encounters:  02/21/24 130/72  01/30/24 134/80  01/01/24 100/80         Past Medical History:  Diagnosis Date   Chicken pox    Diabetes mellitus without complication (HCC)    Essential hypertension    GERD (gastroesophageal reflux disease)    Gouty arthritis    Hypertension    Iron  deficiency anemia    Vitamin D  deficiency    Past Surgical History:  Procedure Laterality Date   BIOPSY  12/05/2021   Procedure: BIOPSY;  Surgeon: Legrand Victory LITTIE DOUGLAS, MD;  Location: [facility];  Service: Gastroenterology;;   ESOPHAGOGASTRODUODENOSCOPY N/A 12/05/2021   Procedure: ESOPHAGOGASTRODUODENOSCOPY (EGD);  Surgeon: Legrand Victory LITTIE DOUGLAS, MD;  Location: [facility];  Service: Gastroenterology;  Laterality: N/A;   HOT HEMOSTASIS N/A 12/05/2021   Procedure: HOT HEMOSTASIS (ARGON PLASMA COAGULATION/BICAP);   Surgeon: Legrand Victory LITTIE DOUGLAS, MD;  Location: [facility];  Service: Gastroenterology;  Laterality: N/A;   INGUINAL HERNIA REPAIR Right 12/19/2017   Procedure: HERNIA REPAIR INGUINAL RIGHT;  Surgeon: Vernetta Berg, MD;  Location: [facility];  Service: General;  Laterality: Right;   INSERTION OF MESH Right 12/19/2017   Procedure: INSERTION OF MESH;  Surgeon: Vernetta Berg, MD;  Location: [facility];  Service: General;  Laterality: Right;   MASS EXCISION Right 12/19/2017   Procedure: EXCISION OF RIGHT SCROTAL MASS;  Surgeon: Vernetta Berg, MD;  Location: [facility];  Service: General;  Laterality: Right;   TONSILLECTOMY      reports that he has quit smoking. His smoking use included cigarettes. He has a 50 pack-year smoking history. He has quit using smokeless tobacco.  His smokeless tobacco use included chew. He reports that he does not currently use alcohol. He reports that he does not use drugs. family history includes Diabetes in his mother; Heart disease in his mother; Hypertension in his father and mother; Stroke in his brother and father. No Known Allergies Current Outpatient Medications on File Prior to Visit  Medication Sig Dispense Refill   allopurinol  (ZYLOPRIM ) 100 MG tablet Take 1 tablet (100 mg total) by mouth 2 (two) times daily. 180 tablet 3   Continuous Glucose Sensor (DEXCOM G7 SENSOR) MISC 1 Device by  Does not apply route as directed. Change sensor every 10 days 9 each 3   dapagliflozin  propanediol (FARXIGA ) 10 MG TABS tablet Take 1 tablet (10 mg total) by mouth daily. 90 tablet 3   diclofenac  Sodium (VOLTAREN ) 1 % GEL Apply 2 g topically 4 (four) times daily. 50 g 0   Dulaglutide  (TRULICITY ) 4.5 MG/0.5ML SOAJ Inject 4.5 mg as directed once a week. 6 mL 3   ferrous sulfate  324 (65 Fe) MG TBEC Take 1 tablet (325 mg total) by mouth daily. 30 tablet 0   gabapentin  (NEURONTIN ) 100 MG capsule Take 2 capsules (200 mg total) by mouth at bedtime. 180 capsule 3   glipiZIDE  (GLUCOTROL  XL) 10  MG 24 hr tablet Take 1 tablet (10 mg total) by mouth 2 (two) times daily. 90 tablet 1   HUMALOG  KWIKPEN 100 UNIT/ML KwikPen INJECT 22 UNITS AFTER BREAKFAST AND 18 UNITS AFTER DINNER DAILY. 15 mL 0   HUMULIN  N 100 UNIT/ML injection Inject up to 30 units in morning and 40 units at bedtime 70 mL 3   hydrochlorothiazide  (HYDRODIURIL ) 25 MG tablet TAKE 1 TABLET BY MOUTH EVERY DAY 30 tablet 11   ibuprofen  (ADVIL ) 600 MG tablet Take 600 mg by mouth every 6 (six) hours as needed.     insulin  aspart (NOVOLOG  FLEXPEN) 100 UNIT/ML FlexPen Inject 25 Units into the skin daily before breakfast. 45 mL 0   Insulin  Syringe-Needle U-100 (RELION INSULIN  SYRINGE) 31G X 15/64 0.5 ML MISC Use to inject insulin  twice a day. 200 each 3   iron  polysaccharides (NU-IRON ) 150 MG capsule Take 1 capsule (150 mg total) by mouth daily. 90 capsule 1   lisinopril  (ZESTRIL ) 5 MG tablet Take 5 mg by mouth daily.     lovastatin  (MEVACOR ) 40 MG tablet TAKE 1 TABLET BY MOUTH EVERYDAY AT BEDTIME 30 tablet 11   meloxicam  (MOBIC ) 15 MG tablet TAKE 1 TABLET BY MOUTH ONCE DAILY AS NEEDED FOR PAIN 90 tablet 0   metFORMIN  (GLUCOPHAGE ) 1000 MG tablet TAKE 1 TABLET BY MOUTH TWICE A DAY WITH FOOD 60 tablet 11   methocarbamol  (ROBAXIN ) 500 MG tablet Take 1 tablet (500 mg total) by mouth 4 (four) times daily. 120 tablet 2   neomycin -polymyxin-hydrocortisone (CORTISPORIN) 3.5-10000-1 OTIC suspension Apply 1-2 drops daily after soaking and cover with bandaid 10 mL 0   Ostomy Supplies (SKIN TAC ADHESIVE BARRIER WIPE) MISC Use to help apply Dexcom sensor every 10 days 100 each 1   OVER THE COUNTER MEDICATION Take 1 tablet by mouth 2 (two) times daily. Beet Root supplement     pantoprazole  (PROTONIX ) 40 MG tablet Take 1 tablet (40 mg total) by mouth daily. 30 tablet 6   RELION PEN NEEDLE 31G/8MM 31G X 8 MM MISC USE AS DIRECTED TWICE DAILY 150 each 0   solifenacin  (VESICARE ) 5 MG tablet TAKE 1 TABLET (5 MG TOTAL) BY MOUTH DAILY. 30 tablet 11    tamsulosin  (FLOMAX ) 0.4 MG CAPS capsule TAKE 1 CAPSULE BY MOUTH EVERY DAY 30 capsule 11   traMADol  (ULTRAM ) 50 MG tablet Take 1 tablet (50 mg total) by mouth every 6 (six) hours as needed. 120 tablet 1   No current facility-administered medications on file prior to visit.        ROS:  All others reviewed and negative.  Objective        PE:  BP 130/72   Pulse 74   Temp 98.2 F (36.8 C)   Ht 5' 8 (1.727 m)  Wt 231 lb 9.6 oz (105.1 kg)   SpO2 99%   BMI 35.21 kg/m                 Constitutional: Pt appears in NAD               HENT: Head: NCAT.                Right Ear: External ear normal.                 Left Ear: External ear normal.                Eyes: . Pupils are equal, round, and reactive to light. Conjunctivae and EOM are normal               Nose: without d/c or deformity               Neck: Neck supple. Gross normal ROM               Cardiovascular: Normal rate and regular rhythm.                 Pulmonary/Chest: Effort normal and breath sounds without rales or wheezing.                Abd:  Soft, NT, ND, + BS, no organomegaly               Neurological: Pt is alert. At baseline orientation, motor grossly intact               Skin: Skin is warm. No rashes, no other new lesions, LE edema - none               Psychiatric: Pt behavior is normal without agitation   Micro: none  Cardiac tracings I have personally interpreted today:  none  Pertinent Radiological findings (summarize): none   Lab Results  Component Value Date   WBC 10.1 10/24/2023   HGB 13.9 10/24/2023   HCT 43.4 10/24/2023   PLT 416.0 (H) 10/24/2023   GLUCOSE 207 (H) 10/24/2023   CHOL 140 04/09/2023   TRIG 246.0 (H) 04/09/2023   HDL 32.00 (L) 04/09/2023   LDLDIRECT 78.0 03/24/2022   LDLCALC 59 04/09/2023   ALT 28 10/24/2023   AST 14 10/24/2023   NA 126 (L) 10/24/2023   K 4.4 10/24/2023   CL 89 (L) 10/24/2023   CREATININE 1.55 (H) 10/24/2023   BUN 33 (H) 10/24/2023   CO2 27 10/24/2023   TSH  2.96 10/24/2023   PSA 0.50 09/19/2022   HGBA1C 7.7 (A) 01/01/2024   MICROALBUR <0.2 10/02/2023   Assessment/Plan:  Steven [name] is a 63 y.o. Black or African American [2] male with  has a past medical history of Chicken pox, Diabetes mellitus without complication (HCC), Essential hypertension, GERD (gastroesophageal reflux disease), Gouty arthritis, Hypertension, Iron  deficiency anemia, and Vitamin D  deficiency.  Iron  deficiency anemia S/p egd and colonoscopy and camera 2023, now recurrend, pt to continue oral iron  and f/u GI as planned, for iron  with labs today  Diabetes [facility]) Lab Results  Component Value Date   HGBA1C 7.7 (A) 01/01/2024  With hyperglycemia, polyneuropathy Uncontrolled, pt to continue current medical treatment farxiga  10 mg every day, trulicity  4.5 mg weekly, humalog  22 u am and 18 u pm, and metformin  1000 bid, declines any change today   Dysphagia Subjectively improved, but has recent wt loss that is not clear is related to trulicity  since  this is uncommon, also for GI f/u  Hyponatremia Moderate at last visit, has been improving his oral intake per pt, now for f/u lab today  Followup: No follow-ups on file.  Steven Rush, MD 02/21/2024 1:08 PM [facility] Internal Medicine

## 2024-02-21 NOTE — Progress Notes (Signed)
 The test results show that your current treatment is OK, as the tests are stable.  Please continue the same plan.  There is no other need for change of treatment or further evaluation based on these results, at this time.  thanks

## 2024-02-21 NOTE — Assessment & Plan Note (Addendum)
 Lab Results  Component Value Date   HGBA1C 7.7 (A) 01/01/2024  With hyperglycemia, polyneuropathy Uncontrolled, pt to continue current medical treatment farxiga  10 mg every day, trulicity  4.5 mg weekly, humalog  22 u am and 18 u pm, and metformin  1000 bid, declines any change today

## 2024-02-21 NOTE — Assessment & Plan Note (Signed)
 Moderate at last visit, has been improving his oral intake per pt, now for f/u lab today

## 2024-02-21 NOTE — Assessment & Plan Note (Signed)
 Subjectively improved, but has recent wt loss that is not clear is related to trulicity  since this is uncommon, also for GI f/u

## 2024-02-26 ENCOUNTER — Ambulatory Visit: Admitting: Family Medicine

## 2024-02-26 ENCOUNTER — Encounter: Payer: Self-pay | Admitting: Family Medicine

## 2024-02-26 ENCOUNTER — Other Ambulatory Visit: Payer: Self-pay

## 2024-02-26 VITALS — BP 130/70 | HR 95 | Ht 68.0 in

## 2024-02-26 DIAGNOSIS — M1711 Unilateral primary osteoarthritis, right knee: Secondary | ICD-10-CM

## 2024-02-26 DIAGNOSIS — E538 Deficiency of other specified B group vitamins: Secondary | ICD-10-CM

## 2024-02-26 DIAGNOSIS — M79642 Pain in left hand: Secondary | ICD-10-CM

## 2024-02-26 DIAGNOSIS — G5601 Carpal tunnel syndrome, right upper limb: Secondary | ICD-10-CM | POA: Diagnosis not present

## 2024-02-26 DIAGNOSIS — M79641 Pain in right hand: Secondary | ICD-10-CM | POA: Diagnosis not present

## 2024-02-26 DIAGNOSIS — M5416 Radiculopathy, lumbar region: Secondary | ICD-10-CM

## 2024-02-26 NOTE — Assessment & Plan Note (Signed)
Encourage supplementation 

## 2024-02-26 NOTE — Patient Instructions (Addendum)
 Injected R wrist and R knee Epidural [phone] [facility] [employer] [facility] [employer] [address] [place], [place] [address] JAYSON [phone]

## 2024-02-26 NOTE — Assessment & Plan Note (Signed)
 Continue radicular symptoms noted.  Now appears the patient does have a interbody fusion noted of the back.  Patient low denies ever having true surgery.  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  Would recommend that patient has an epidural.  Monitor blood sugars closer.  Patient understands this.  Will try the epidural at the L3-L4 and then follow-up with me again 6 weeks after the injection to see how patient responds.

## 2024-02-26 NOTE — Assessment & Plan Note (Signed)
 Injection given today, tolerated procedure well, encouraged him to monitor the blood sugars on a more regular basis.  Discussed which activities to do and which ones to avoid.  Discussed with patient that this is usually from repetitive activity and does do a lot of manual labor.  Discussed bracing at night.  Has had home exercises for the contralateral side previously.  Follow-up again in 6 to 8 weeks otherwise.

## 2024-02-26 NOTE — Assessment & Plan Note (Signed)
 Given injection previously, discussed icing regimen and home exercises, and patient needs new straps for his custom brace.  Continues to allow him to be active with this.  Follow-up again in 6 to 8 weeks

## 2024-03-03 ENCOUNTER — Other Ambulatory Visit: Payer: Self-pay

## 2024-03-03 MED ORDER — SOLIFENACIN SUCCINATE 5 MG PO TABS: 5.0000 mg | ORAL_TABLET | Freq: Every day | ORAL | 11 refills | Status: AC

## 2024-03-05 ENCOUNTER — Other Ambulatory Visit: Payer: Self-pay | Admitting: "Endocrinology

## 2024-03-05 DIAGNOSIS — E1165 Type 2 diabetes mellitus with hyperglycemia: Secondary | ICD-10-CM

## 2024-03-10 NOTE — Discharge Instructions (Signed)

## 2024-03-11 ENCOUNTER — Inpatient Hospital Stay
Admission: RE | Admit: 2024-03-11 | Discharge: 2024-03-11 | Disposition: A | Discharge status: Home / Self Care | Source: Ambulatory Visit | Attending: Family Medicine | Admitting: Family Medicine

## 2024-03-12 ENCOUNTER — Other Ambulatory Visit (HOSPITAL_COMMUNITY): Payer: Self-pay

## 2024-03-12 ENCOUNTER — Telehealth: Payer: Self-pay

## 2024-03-12 ENCOUNTER — Other Ambulatory Visit: Payer: Self-pay | Admitting: Family Medicine

## 2024-03-12 DIAGNOSIS — M5416 Radiculopathy, lumbar region: Secondary | ICD-10-CM

## 2024-03-12 NOTE — Telephone Encounter (Signed)
 Pharmacy Patient Advocate Encounter  Received notification from [facility] that Prior Authorization for traMADol  HCl 50MG  tablets   Per test claim: PA IS NOT NEEDED AT THIS TIME. Plan required pt first 7 days supply at no cost to PT and the plan will pay for refills there after no more than 8 tablets per day up to a 30 days supply.   PLEASE BE ADVISED I HAVE CALLED AND ADVISED THE PHARMACY AND THEY HAVE REPROCESS AND WILL LET THE PT KNOW.

## 2024-03-12 NOTE — Telephone Encounter (Signed)
 Pharmacy Patient Advocate Encounter   Received notification from CoverMyMeds that prior authorization for traMADol  HCl 50MG  tablets  is required/requested.   Insurance verification completed.   The patient is insured through [facility] MEDICARE.   Per test claim: PA required; PA submitted to above mentioned insurance via Latent Key/confirmation #/[id number] [id number] Status is pending

## 2024-03-12 NOTE — Discharge Instructions (Signed)

## 2024-03-14 ENCOUNTER — Ambulatory Visit
Admission: RE | Admit: 2024-03-14 | Discharge: 2024-03-14 | Disposition: A | Discharge status: Home / Self Care | Source: Ambulatory Visit | Attending: Family Medicine | Admitting: Family Medicine

## 2024-03-14 DIAGNOSIS — M4727 Other spondylosis with radiculopathy, lumbosacral region: Secondary | ICD-10-CM | POA: Diagnosis not present

## 2024-03-14 DIAGNOSIS — M5416 Radiculopathy, lumbar region: Secondary | ICD-10-CM

## 2024-03-14 MED ORDER — IOPAMIDOL (ISOVUE-M 200) INJECTION 41%
1.0000 mL | Freq: Once | INTRAMUSCULAR | Status: AC
  Administered 2024-03-14: 1 mL via EPIDURAL

## 2024-03-14 MED ADMIN — Methylprednisolone Acetate Inj Susp 40 MG/ML: 80 mg | EPIDURAL | NDC 00009307301

## 2024-03-27 ENCOUNTER — Ambulatory Visit: Admitting: Podiatry

## 2024-04-01 ENCOUNTER — Other Ambulatory Visit

## 2024-04-01 ENCOUNTER — Other Ambulatory Visit: Payer: Self-pay | Admitting: "Endocrinology

## 2024-04-01 DIAGNOSIS — E782 Mixed hyperlipidemia: Secondary | ICD-10-CM

## 2024-04-01 DIAGNOSIS — E1165 Type 2 diabetes mellitus with hyperglycemia: Secondary | ICD-10-CM | POA: Diagnosis not present

## 2024-04-01 DIAGNOSIS — Z794 Long term (current) use of insulin: Secondary | ICD-10-CM | POA: Diagnosis not present

## 2024-04-01 LAB — HEMOGLOBIN A1C
Hgb A1c MFr Bld: 8.3 % — ABNORMAL HIGH (ref <5.7)
Mean Plasma Glucose: 192 mg/dL
eAG (mmol/L): 10.6 mmol/L

## 2024-04-01 LAB — LIPID PANEL
Cholesterol: 155 mg/dL (ref <200)
HDL: 43 mg/dL (ref >=40)
LDL Cholesterol (Calc): 82 mg/dL
Non-HDL Cholesterol (Calc): 112 mg/dL (ref <130)
Total CHOL/HDL Ratio: 3.6 (calc) (ref <5.0)
Triglycerides: 205 mg/dL — ABNORMAL HIGH (ref <150)

## 2024-04-07 ENCOUNTER — Ambulatory Visit (INDEPENDENT_AMBULATORY_CARE_PROVIDER_SITE_OTHER): Admitting: "Endocrinology

## 2024-04-07 ENCOUNTER — Encounter: Payer: Self-pay | Admitting: "Endocrinology

## 2024-04-07 VITALS — BP 130/80 | HR 76 | Ht 68.0 in | Wt 235.0 lb

## 2024-04-07 DIAGNOSIS — Z794 Long term (current) use of insulin: Secondary | ICD-10-CM

## 2024-04-07 DIAGNOSIS — Z7984 Long term (current) use of oral hypoglycemic drugs: Secondary | ICD-10-CM | POA: Diagnosis not present

## 2024-04-07 DIAGNOSIS — Z7985 Long-term (current) use of injectable non-insulin antidiabetic drugs: Secondary | ICD-10-CM

## 2024-04-07 DIAGNOSIS — E1165 Type 2 diabetes mellitus with hyperglycemia: Secondary | ICD-10-CM

## 2024-04-07 DIAGNOSIS — E782 Mixed hyperlipidemia: Secondary | ICD-10-CM

## 2024-04-07 MED ORDER — METFORMIN HCL 1000 MG PO TABS: ORAL_TABLET | ORAL | 3 refills | Status: AC

## 2024-04-07 MED ORDER — TIRZEPATIDE 5 MG/0.5ML ~~LOC~~ SOAJ: 5.0000 mg | SUBCUTANEOUS | 1 refills | Status: AC

## 2024-04-07 NOTE — Patient Instructions (Signed)
 Trulicity  4.5 mg/week  Farxiga  10 mg daily, metformin  1 g twice daily, glipizide  XL 10 mg bid  NPH 42 units before break fast and 40 units before supper (take 15 min before break fast and dinner) Humalog  6-10 units 15 min before break fast Humalog  kwikpen correction scale: Use as needed based on blood sugars as follows: 151 - 175: 1 unit 176 - 200: 2 units 201 - 225: 3 units 226 - 250: 4 units 251 - 275: 5 units 276 - 300: 6 units 301 - 325: 7 units 326 - 350: 8 units 351 - 375: 9 units 376 - 400: 10 units

## 2024-04-07 NOTE — Progress Notes (Signed)
 Outpatient Endocrinology Note Obadiah Birmingham, MD  04/07/24   [name] Dec Aug 28, 1960 [id number]  Referring Provider: Norleen Lynwood ORN, MD Primary Care Provider: Norleen Lynwood ORN, MD Reason for consultation: Subjective   Assessment & Plan  Diagnoses and all orders for this visit:  Uncontrolled type 2 diabetes mellitus with hyperglycemia, with long-term current use of insulin  (HCC) -     tirzepatide (MOUNJARO) 5 MG/0.5ML Pen; Inject 5 mg into the skin once a week. 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, neuropathy  Lab Results  Component Value Date   GFR 61.55 02/21/2024   Hba1c goal less than 7, current Hba1c is  Lab Results  Component Value Date   HGBA1C 8.3 (H) 04/01/2024   Recommend:  C-peptide + Trulicity  4.5 mg/week ->switch to mounjaro  Farxiga  10 mg daily, metformin  1 g twice daily, glipizide  XL 10 mg bid  NPH 42 units before break fast and 40 units before supper (take 15 min before break fast and dinner) Humalog  6-10 units 15 min before break fast Humalog  kwikpen correction scale: Use as needed based on blood sugars as follows: 151 - 175: 1 unit 176 - 200: 2 units 201 - 225: 3 units 226 - 250: 4 units 251 - 275: 5 units 276 - 300: 6 units 301 - 325: 7 units 326 - 350: 8 units 351 - 375: 9 units 376 - 400: 10 units   Goal is to decrease humalog  insulin  once settled on Trulicity   Check BG before meals Ordered DM education and instructed pt to bring all insulins to help with timings of intake as patient doesn't remember names of insulin   Use skin tac to allow dexcom to stay put  No known contraindications to any of above  medications Glucagon  discussed and prescribed with refills on 12/04/22  -Last LD and Tg are as follows: Lab Results  Component Value Date   LDLCALC 82 04/01/2024    Lab Results  Component Value Date   TRIG 205 (H) 04/01/2024   -on lovastatin  40 mg every day -Follow low fat diet and exercise   -Blood pressure goal <140/90 - Microalbumin/creatinine goal < 30 -Last MA/Cr is as follows: Lab Results  Component Value Date   MICROALBUR <0.2 10/02/2023   -on ACE/ARB lisinopril  2.5 mg every day -diet changes including salt restriction -limit eating outside -counseled BP targets per standards of diabetes care -uncontrolled blood pressure can lead to retinopathy, nephropathy and cardiovascular and atherosclerotic heart disease  Reviewed and counseled on: -A1C target -Blood sugar targets -Complications of uncontrolled diabetes  -Checking blood sugar before meals and bedtime and bring log next visit -All medications with mechanism of action and side effects -Hypoglycemia management: rule of 15's, Glucagon  Emergency Kit and medical alert ID -low-carb low-fat plate-method diet -At least 20 minutes of physical activity per day -Annual dilated retinal eye exam and foot exam -compliance and follow up needs -follow up as scheduled or earlier if problem gets worse  Call if blood sugar is less than 70 or consistently above 250    Take a 15 gm snack of carbohydrate at bedtime before you go to sleep if your blood sugar is less  than 100.    If you are going to fast after midnight for a test or procedure, ask your physician for instructions on how to reduce/decrease your insulin  dose.    Call if blood sugar is less than 70 or consistently above 250  -Treating a low sugar by rule of 15  (15 gms of sugar every 15 min until sugar is more than 70) If you feel your sugar is low, test your sugar to be sure If your sugar is low (less than 70), then take 15 grams of a fast acting Carbohydrate (3-4  glucose tablets or glucose gel or 4 ounces of juice or regular soda) Recheck your sugar 15 min after treating low to make sure it is more than 70 If sugar is still less than 70, treat again with 15 grams of carbohydrate          Don't drive the hour of hypoglycemia  If unconscious/unable to eat or drink by mouth, use glucagon  injection or nasal spray baqsimi and call 911. Can repeat again in 15 min if still unconscious.  Return in about 4 weeks (around 05/05/2024) for ok to double book .   I have reviewed current medications, nurse's notes, allergies, vital signs, past medical and surgical history, family medical history, and social history for this encounter. Counseled patient on symptoms, examination findings, lab findings, imaging results, treatment decisions and monitoring and prognosis. The patient understood the recommendations and agrees with the treatment plan. All questions regarding treatment plan were fully answered.  Obadiah Birmingham, MD  04/07/24  History of Present Illness Kalob Bergen is a 63 y.o. year old male who presents for follow up of Type II diabetes mellitus.  Quirino Kakos was first diagnosed in 2010.   Diabetes education +  Home diabetes regimen: Farxiga  10 mg daily, metformin  1 g twice daily, glipizide  XL 10 mg bid  NPH 35 units qpm and 40 qam  Humalog  correction scale  Trulicity  4.5 mg/week  Previous history:     Previously on metformin  and started on insulin  in 10/21 He has only been on NPH A1c range in the last few years: 8.1-12.1  COMPLICATIONS -  MI/Stroke -  retinopathy +  neuropathy +  nephropathy  BLOOD SUGAR DATA CGM interpretation: At today's visit, we reviewed her CGM downloads. The full report is scanned in the media. Reviewing the CGM trends, BG are high after break fast and some after supper.   Physical Exam  BP 130/80   Pulse 76   Ht 5' 8 (1.727 m)   Wt 235 lb (106.6 kg)   SpO2 96%   BMI 35.73 kg/m    Constitutional: well  developed, well nourished Head: normocephalic, atraumatic Eyes: sclera anicteric, no redness Neck: supple Lungs: normal respiratory effort Neurology: alert and oriented Skin: dry, no appreciable rashes Musculoskeletal: no appreciable defects Psychiatric: normal mood and affect Diabetic Foot Exam - Simple   No data filed      Current Medications Patient's Medications  New Prescriptions   TIRZEPATIDE (MOUNJARO) 5 MG/0.5ML PEN    Inject 5 mg into the skin once a week.  Previous Medications   ALLOPURINOL  (ZYLOPRIM ) 100 MG TABLET    Take 1 tablet (100 mg total) by mouth 2 (two) times daily.   CONTINUOUS GLUCOSE SENSOR (DEXCOM G7 SENSOR) MISC    1 Device by Does not apply route as directed. Change sensor every 10 days   DICLOFENAC  SODIUM (VOLTAREN ) 1 % GEL    Apply 2 g topically  4 (four) times daily.   FARXIGA  10 MG TABS TABLET    Take 1 tablet by mouth once daily   FERROUS SULFATE  324 (65 FE) MG TBEC    Take 1 tablet (325 mg total) by mouth daily.   GABAPENTIN  (NEURONTIN ) 100 MG CAPSULE    Take 2 capsules (200 mg total) by mouth at bedtime.   GLIPIZIDE  (GLUCOTROL  XL) 10 MG 24 HR TABLET    Take 1 tablet (10 mg total) by mouth 2 (two) times daily.   HUMALOG  KWIKPEN 100 UNIT/ML KWIKPEN    INJECT 22 UNITS AFTER BREAKFAST AND 18 UNITS AFTER DINNER DAILY.   HUMULIN  N 100 UNIT/ML INJECTION    Inject up to 30 units in morning and 40 units at bedtime   HYDROCHLOROTHIAZIDE  (HYDRODIURIL ) 25 MG TABLET    TAKE 1 TABLET BY MOUTH EVERY DAY   IBUPROFEN  (ADVIL ) 600 MG TABLET    Take 600 mg by mouth every 6 (six) hours as needed.   INSULIN  ASPART (NOVOLOG  FLEXPEN) 100 UNIT/ML FLEXPEN    Inject 25 Units into the skin daily before breakfast.   INSULIN  SYRINGE-NEEDLE U-100 (RELION INSULIN  SYRINGE) 31G X 15/64 0.5 ML MISC    Use to inject insulin  twice a day.   IRON  POLYSACCHARIDES (NU-IRON ) 150 MG CAPSULE    Take 1 capsule (150 mg total) by mouth daily.   LISINOPRIL  (ZESTRIL ) 5 MG TABLET    Take 5 mg by  mouth daily.   LOVASTATIN  (MEVACOR ) 40 MG TABLET    TAKE 1 TABLET BY MOUTH EVERYDAY AT BEDTIME   MELOXICAM  (MOBIC ) 15 MG TABLET    TAKE 1 TABLET BY MOUTH ONCE DAILY AS NEEDED FOR PAIN   METHOCARBAMOL  (ROBAXIN ) 500 MG TABLET    Take 1 tablet (500 mg total) by mouth 4 (four) times daily.   NEOMYCIN -POLYMYXIN-HYDROCORTISONE (CORTISPORIN) 3.5-10000-1 OTIC SUSPENSION    Apply 1-2 drops daily after soaking and cover with bandaid   OSTOMY SUPPLIES (SKIN TAC ADHESIVE BARRIER WIPE) MISC    Use to help apply Dexcom sensor every 10 days   OVER THE COUNTER MEDICATION    Take 1 tablet by mouth 2 (two) times daily. Beet Root supplement   PANTOPRAZOLE  (PROTONIX ) 40 MG TABLET    Take 1 tablet (40 mg total) by mouth daily.   RELION PEN NEEDLE 31G/8MM 31G X 8 MM MISC    USE AS DIRECTED TWICE DAILY   SOLIFENACIN  (VESICARE ) 5 MG TABLET    Take 1 tablet (5 mg total) by mouth daily.   TAMSULOSIN  (FLOMAX ) 0.4 MG CAPS CAPSULE    TAKE 1 CAPSULE BY MOUTH EVERY DAY   TRAMADOL  (ULTRAM ) 50 MG TABLET    Take 1 tablet (50 mg total) by mouth every 6 (six) hours as needed.  Modified Medications   Modified Medication Previous Medication   METFORMIN  (GLUCOPHAGE ) 1000 MG TABLET metFORMIN  (GLUCOPHAGE ) 1000 MG tablet      TAKE 1 TABLET BY MOUTH TWICE A DAY WITH FOOD    TAKE 1 TABLET BY MOUTH TWICE A DAY WITH FOOD  Discontinued Medications   DULAGLUTIDE  (TRULICITY ) 4.5 MG/0.5ML SOAJ    Inject 4.5 mg as directed once a week.    Allergies No Known Allergies  Past Medical History Past Medical History:  Diagnosis Date   Chicken pox    Diabetes mellitus without complication (HCC)    Essential hypertension    GERD (gastroesophageal reflux disease)    Gouty arthritis    Hypertension    Iron  deficiency anemia  Vitamin D  deficiency     Past Surgical History Past Surgical History:  Procedure Laterality Date   BIOPSY  12/05/2021   Procedure: BIOPSY;  Surgeon: Legrand Victory LITTIE DOUGLAS, MD;  Location: [facility];  Service:  Gastroenterology;;   ESOPHAGOGASTRODUODENOSCOPY N/A 12/05/2021   Procedure: ESOPHAGOGASTRODUODENOSCOPY (EGD);  Surgeon: Legrand Victory LITTIE DOUGLAS, MD;  Location: [facility];  Service: Gastroenterology;  Laterality: N/A;   HOT HEMOSTASIS N/A 12/05/2021   Procedure: HOT HEMOSTASIS (ARGON PLASMA COAGULATION/BICAP);  Surgeon: Legrand Victory LITTIE DOUGLAS, MD;  Location: [facility];  Service: Gastroenterology;  Laterality: N/A;   INGUINAL HERNIA REPAIR Right 12/19/2017   Procedure: HERNIA REPAIR INGUINAL RIGHT;  Surgeon: Vernetta Berg, MD;  Location: [facility];  Service: General;  Laterality: Right;   INSERTION OF MESH Right 12/19/2017   Procedure: INSERTION OF MESH;  Surgeon: Vernetta Berg, MD;  Location: [facility];  Service: General;  Laterality: Right;   MASS EXCISION Right 12/19/2017   Procedure: EXCISION OF RIGHT SCROTAL MASS;  Surgeon: Vernetta Berg, MD;  Location: [facility];  Service: General;  Laterality: Right;   TONSILLECTOMY      Family History family history includes Diabetes in his mother; Heart disease in his mother; Hypertension in his father and mother; Stroke in his brother and father.  Social History Social History   Socioeconomic History   Marital status: Divorced    Spouse name: Not on file   Number of children: 1   Years of education: 12   Highest education level: 12th grade  Occupational History   Occupation: retired  Tobacco Use   Smoking status: Former    Current packs/day: 2.00    Average packs/day: 2.0 packs/day for 25.0 years (50.0 ttl pk-yrs)    Types: Cigarettes   Smokeless tobacco: Former    Types: Engineer, Drilling   Vaping status: Never Used  Substance and Sexual Activity   Alcohol use: Not Currently   Drug use: No   Sexual activity: Yes    Birth control/protection: Condom  Other Topics Concern   Not on file  Social History Narrative   Fun: Just working   Social Drivers of Corporate Investment Banker Strain: Low Risk  (05/16/2023)   Overall Financial  Resource Strain (CARDIA)    Difficulty of Paying Living Expenses: Not very hard  Recent Concern: Financial Resource Strain - Medium Risk (04/07/2023)   Overall Financial Resource Strain (CARDIA)    Difficulty of Paying Living Expenses: Somewhat hard  Food Insecurity: No Food Insecurity (05/16/2023)   Hunger Vital Sign    Worried About Running Out of Food in the Last Year: Never true    Ran Out of Food in the Last Year: Never true  Recent Concern: Food Insecurity - Food Insecurity Present (04/07/2023)   Hunger Vital Sign    Worried About Running Out of Food in the Last Year: Sometimes true    Ran Out of Food in the Last Year: Sometimes true  Transportation Needs: No Transportation Needs (05/16/2023)   PRAPARE - Administrator, Civil Service (Medical): No    Lack of Transportation (Non-Medical): No  Physical Activity: Insufficiently Active (05/16/2023)   Exercise Vital Sign    Days of Exercise per Week: 2 days    Minutes of Exercise per Session: 10 min  Stress: No Stress Concern Present (05/16/2023)   [employer] of Occupational Health - Occupational Stress Questionnaire    Feeling of Stress : Not at all  Social Connections: Moderately Isolated (05/16/2023)  Social Advertising Account Executive    Frequency of Communication with Friends and Family: More than three times a week    Frequency of Social Gatherings with Friends and Family: Once a week    Attends Religious Services: 1 to 4 times per year    Active Member of Clubs or Organizations: No    Attends Banker Meetings: Not on file    Marital Status: Divorced  Intimate Partner Violence: Not on file    Lab Results  Component Value Date   HGBA1C 8.3 (H) 04/01/2024   HGBA1C 7.7 (A) 01/01/2024   HGBA1C 8.8 (H) 10/24/2023   Lab Results  Component Value Date   CHOL 155 04/01/2024   Lab Results  Component Value Date   HDL 43 04/01/2024   Lab Results  Component Value Date   LDLCALC 82  04/01/2024   Lab Results  Component Value Date   TRIG 205 (H) 04/01/2024   Lab Results  Component Value Date   CHOLHDL 3.6 04/01/2024   Lab Results  Component Value Date   CREATININE 1.23 02/21/2024   Lab Results  Component Value Date   GFR 61.55 02/21/2024   Lab Results  Component Value Date   MICROALBUR <0.2 10/02/2023      Component Value Date/Time   NA 137 02/21/2024 1117   K 4.0 02/21/2024 1117   CL 102 02/21/2024 1117   CO2 24 02/21/2024 1117   GLUCOSE 236 (H) 02/21/2024 1117   BUN 16 02/21/2024 1117   CREATININE 1.23 02/21/2024 1117   CALCIUM 9.6 02/21/2024 1117   PROT 7.4 02/21/2024 1117   ALBUMIN 4.4 02/21/2024 1117   AST 16 02/21/2024 1117   ALT 29 02/21/2024 1117   ALKPHOS 67 02/21/2024 1117   BILITOT 0.3 02/21/2024 1117   GFRNONAA 54 (L) 10/18/2023 1930   GFRAA >60 01/30/2018 2037      Latest Ref Rng & Units 02/21/2024   11:17 AM 10/24/2023    2:52 PM 10/18/2023    7:30 PM  BMP  Glucose 70 - 99 mg/dL 763  792  857   BUN 6 - 23 mg/dL 16  33  21   Creatinine 0.40 - 1.50 mg/dL 8.76  8.44  8.55   Sodium 135 - 145 mEq/L 137  126  135   Potassium 3.5 - 5.1 mEq/L 4.0  4.4  4.6   Chloride 96 - 112 mEq/L 102  89  100   CO2 19 - 32 mEq/L 24  27  23    Calcium 8.4 - 10.5 mg/dL 9.6  9.8  9.9        Component Value Date/Time   WBC 7.3 02/21/2024 1117   RBC 5.20 02/21/2024 1117   HGB 13.3 02/21/2024 1117   HCT 41.7 02/21/2024 1117   PLT 349.0 02/21/2024 1117   MCV 80.1 02/21/2024 1117   MCH 25.0 (L) 10/18/2023 1930   MCHC 31.8 02/21/2024 1117   RDW 19.7 (H) 02/21/2024 1117   LYMPHSABS 2.8 02/21/2024 1117   MONOABS 0.5 02/21/2024 1117   EOSABS 0.2 02/21/2024 1117   BASOSABS 0.0 02/21/2024 1117     Parts of this note may have been dictated using voice recognition software. There may be variances in spelling and vocabulary which are unintentional. Not all errors are proofread. Please notify the dino if any discrepancies are noted or if the meaning of  any statement is not clear.

## 2024-04-11 ENCOUNTER — Encounter: Payer: Self-pay | Admitting: "Endocrinology

## 2024-04-22 NOTE — Progress Notes (Unsigned)
 [name] [address] [place] [address] Phone: [phone] Subjective:   Steven [name], am serving as a scribe for Dr. Arthea Claudene.  I'm seeing this patient by the request  of:  Norleen Lynwood ORN, MD  CC: Multiple joint complaints  YEP:Dlagzrupcz  02/26/2024 Continue radicular symptoms noted.  Now appears the patient does have a interbody fusion noted of the back.  Patient low denies ever having true surgery.  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  Would recommend that patient has an epidural.  Monitor blood sugars closer.  Patient understands this.  Will try the epidural at the L3-L4 and then follow-up with me again 6 weeks after the injection to see how patient responds.   Encourage supplementation     Given injection previously, discussed icing regimen and home exercises, and patient needs new straps for his custom brace. Continues to allow him to be active with this. Follow-up again in 6 to 8 weeks   Injection given today, tolerated procedure well, encouraged him to monitor the blood sugars on a more regular basis. Discussed which activities to do and which ones to avoid. Discussed with patient that this is usually from repetitive activity and does do a lot of manual labor. Discussed bracing at night. Has had home exercises for the contralateral side previously. Follow-up again in 6 to 8 weeks otherwise.   Update 04/23/2024 Steven [name] is a 63 y.o. male coming in with complaint of R knee, R wrist, and lumbar spine pain. Epidural 03/14/2024.  Injected the right knee and the right wrist in September.  Sent to be fitted for custom brace. Patient states that he had one month of relief from the epidural. Back and testicle pain has returned.   Pain in L wrist is slowly coming back. Notices a sticking in all of his fingers.   Pain in R knee has been less.        Past Medical History:   Diagnosis Date   Chicken pox    Diabetes mellitus without complication (HCC)    Essential hypertension    GERD (gastroesophageal reflux disease)    Gouty arthritis    Hypertension    Iron  deficiency anemia    Vitamin D  deficiency    Past Surgical History:  Procedure Laterality Date   BIOPSY  12/05/2021   Procedure: BIOPSY;  Surgeon: Legrand Victory LITTIE DOUGLAS, MD;  Location: [facility];  Service: Gastroenterology;;   ESOPHAGOGASTRODUODENOSCOPY N/A 12/05/2021   Procedure: ESOPHAGOGASTRODUODENOSCOPY (EGD);  Surgeon: Legrand Victory LITTIE DOUGLAS, MD;  Location: [facility];  Service: Gastroenterology;  Laterality: N/A;   HOT HEMOSTASIS N/A 12/05/2021   Procedure: HOT HEMOSTASIS (ARGON PLASMA COAGULATION/BICAP);  Surgeon: Legrand Victory LITTIE DOUGLAS, MD;  Location: [facility];  Service: Gastroenterology;  Laterality: N/A;   INGUINAL HERNIA REPAIR Right 12/19/2017   Procedure: HERNIA REPAIR INGUINAL RIGHT;  Surgeon: Vernetta Berg, MD;  Location: [facility];  Service: General;  Laterality: Right;   INSERTION OF MESH Right 12/19/2017   Procedure: INSERTION OF MESH;  Surgeon: Vernetta Berg, MD;  Location: [facility];  Service: General;  Laterality: Right;   MASS EXCISION Right 12/19/2017   Procedure: EXCISION OF RIGHT SCROTAL MASS;  Surgeon: Vernetta Berg, MD;  Location: [facility];  Service: General;  Laterality: Right;   TONSILLECTOMY     Social History   Socioeconomic History   Marital status: Divorced    Spouse name:  Not on file   Number of children: 1   Years of education: 41   Highest education level: 12th grade  Occupational History   Occupation: retired  Tobacco Use   Smoking status: Former    Current packs/day: 2.00    Average packs/day: 2.0 packs/day for 25.0 years (50.0 ttl pk-yrs)    Types: Cigarettes   Smokeless tobacco: Former    Types: Engineer, Drilling   Vaping status: Never Used  Substance and Sexual Activity   Alcohol use: Not Currently   Drug use: No   Sexual activity: Yes    Birth  control/protection: Condom  Other Topics Concern   Not on file  Social History Narrative   Fun: Just working   Social Drivers of Corporate Investment Banker Strain: Low Risk  (05/16/2023)   Overall Financial Resource Strain (CARDIA)    Difficulty of Paying Living Expenses: Not very hard  Recent Concern: Financial Resource Strain - Medium Risk (04/07/2023)   Overall Financial Resource Strain (CARDIA)    Difficulty of Paying Living Expenses: Somewhat hard  Food Insecurity: No Food Insecurity (05/16/2023)   Hunger Vital Sign    Worried About Running Out of Food in the Last Year: Never true    Ran Out of Food in the Last Year: Never true  Recent Concern: Food Insecurity - Food Insecurity Present (04/07/2023)   Hunger Vital Sign    Worried About Running Out of Food in the Last Year: Sometimes true    Ran Out of Food in the Last Year: Sometimes true  Transportation Needs: No Transportation Needs (05/16/2023)   PRAPARE - Administrator, Civil Service (Medical): No    Lack of Transportation (Non-Medical): No  Physical Activity: Insufficiently Active (05/16/2023)   Exercise Vital Sign    Days of Exercise per Week: 2 days    Minutes of Exercise per Session: 10 min  Stress: No Stress Concern Present (05/16/2023)   [employer] of Occupational Health - Occupational Stress Questionnaire    Feeling of Stress : Not at all  Social Connections: Moderately Isolated (05/16/2023)   Social Connection and Isolation Panel    Frequency of Communication with Friends and Family: More than three times a week    Frequency of Social Gatherings with Friends and Family: Once a week    Attends Religious Services: 1 to 4 times per year    Active Member of [employer] or Organizations: No    Attends Engineer, Structural: Not on file    Marital Status: Divorced   No Known Allergies Family History  Problem Relation Age of Onset   Heart disease Mother    Hypertension Mother    Diabetes  Mother    Stroke Father    Hypertension Father    Stroke Brother     Current Outpatient Medications (Endocrine & Metabolic):    FARXIGA  10 MG TABS tablet, Take 1 tablet by mouth once daily   glipiZIDE  (GLUCOTROL  XL) 10 MG 24 hr tablet, Take 1 tablet (10 mg total) by mouth 2 (two) times daily.   HUMALOG  KWIKPEN 100 UNIT/ML KwikPen, INJECT 22 UNITS AFTER BREAKFAST AND 18 UNITS AFTER DINNER DAILY.   HUMULIN  N 100 UNIT/ML injection, Inject up to 30 units in morning and 40 units at bedtime   insulin  aspart (NOVOLOG  FLEXPEN) 100 UNIT/ML FlexPen, Inject 25 Units into the skin daily before breakfast.   metFORMIN  (GLUCOPHAGE ) 1000 MG tablet, TAKE 1 TABLET BY MOUTH TWICE A DAY WITH  FOOD   tirzepatide  (MOUNJARO ) 5 MG/0.5ML Pen, Inject 5 mg into the skin once a week.  Current Outpatient Medications (Cardiovascular):    hydrochlorothiazide  (HYDRODIURIL ) 25 MG tablet, TAKE 1 TABLET BY MOUTH EVERY DAY   lisinopril  (ZESTRIL ) 5 MG tablet, Take 5 mg by mouth daily.   lovastatin  (MEVACOR ) 40 MG tablet, TAKE 1 TABLET BY MOUTH EVERYDAY AT BEDTIME   Current Outpatient Medications (Analgesics):    allopurinol  (ZYLOPRIM ) 100 MG tablet, Take 1 tablet (100 mg total) by mouth 2 (two) times daily.   ibuprofen  (ADVIL ) 600 MG tablet, Take 600 mg by mouth every 6 (six) hours as needed.   meloxicam  (MOBIC ) 15 MG tablet, TAKE 1 TABLET BY MOUTH ONCE DAILY AS NEEDED FOR PAIN   traMADol  (ULTRAM ) 50 MG tablet, Take 1 tablet (50 mg total) by mouth every 6 (six) hours as needed.  Current Outpatient Medications (Hematological):    ferrous sulfate  324 (65 Fe) MG TBEC, Take 1 tablet (325 mg total) by mouth daily.   iron  polysaccharides (NU-IRON ) 150 MG capsule, Take 1 capsule (150 mg total) by mouth daily.  Current Outpatient Medications (Other):    Continuous Glucose Sensor (DEXCOM G7 SENSOR) MISC, 1 Device by Does not apply route as directed. Change sensor every 10 days   diclofenac  Sodium (VOLTAREN ) 1 % GEL, Apply 2 g  topically 4 (four) times daily.   gabapentin  (NEURONTIN ) 100 MG capsule, Take 2 capsules (200 mg total) by mouth at bedtime.   Insulin  Syringe-Needle U-100 (RELION INSULIN  SYRINGE) 31G X 15/64 0.5 ML MISC, Use to inject insulin  twice a day.   methocarbamol  (ROBAXIN ) 500 MG tablet, Take 1 tablet (500 mg total) by mouth 4 (four) times daily.   neomycin -polymyxin-hydrocortisone (CORTISPORIN) 3.5-10000-1 OTIC suspension, Apply 1-2 drops daily after soaking and cover with bandaid   Ostomy Supplies (SKIN TAC ADHESIVE BARRIER WIPE) MISC, Use to help apply Dexcom sensor every 10 days   OVER THE COUNTER MEDICATION, Take 1 tablet by mouth 2 (two) times daily. Beet Root supplement   pantoprazole  (PROTONIX ) 40 MG tablet, Take 1 tablet (40 mg total) by mouth daily.   RELION PEN NEEDLE 31G/8MM 31G X 8 MM MISC, USE AS DIRECTED TWICE DAILY   solifenacin  (VESICARE ) 5 MG tablet, Take 1 tablet (5 mg total) by mouth daily.   tamsulosin  (FLOMAX ) 0.4 MG CAPS capsule, TAKE 1 CAPSULE BY MOUTH EVERY DAY   Reviewed prior external information including notes and imaging from  primary care provider As well as notes that were available from care everywhere and other healthcare systems.  Past medical history, social, surgical and family history all reviewed in electronic medical record.  No pertanent information unless stated regarding to the chief complaint.   Review of Systems:  No headache, visual changes, nausea, vomiting, diarrhea, constipation, dizziness, abdominal pain, skin rash, fevers, chills, night sweats, weight loss, swollen lymph nodes, body aches, joint swelling, chest pain, shortness of breath, mood changes. POSITIVE muscle aches  Objective  Blood pressure 112/62, pulse 63, height 5' 8 (1.727 m), weight 237 lb (107.5 kg), SpO2 98%.   General: No apparent distress alert and oriented x3 mood and affect normal, dressed appropriately.  HEENT: Pupils equal, extraocular movements intact  Respiratory:  Patient's speak in full sentences and does not appear short of breath  Cardiovascular: No lower extremity edema, non tender, no erythema  Wrist exam shows patient still has a positive Tinel's noted on the right greater than left.  Patient does have tenderness to palpation and arthritic  changes noted of the DIP of the index finger.  This is where patient is having more of the pain today.  Knee exam shows arthritic changes noted.  Ambulating relatively well.  Low back exam shows loss of lordosis.  Positive straight leg test on the left side at 20 degrees of forward flexion.  Deep tendon reflexes intact.   Limited muscular skeletal ultrasound was performed and interpreted by CLAUDENE HUSSAR, M  Limited ultrasound still shows some significant hypoechoic changes and dilatation of the median nerve on the right side.  Patient does have significant arthritic changes noted at the DIP of the index finger with hypoechoic changes noted as well.    Impression and Recommendations:    The above documentation has been reviewed and is accurate and complete Dajana Gehrig M Corinthian Kemler, DO

## 2024-04-23 ENCOUNTER — Encounter: Payer: Self-pay | Admitting: Family Medicine

## 2024-04-23 ENCOUNTER — Ambulatory Visit: Admitting: Family Medicine

## 2024-04-23 ENCOUNTER — Other Ambulatory Visit: Payer: Self-pay

## 2024-04-23 VITALS — BP 112/62 | HR 63 | Ht 68.0 in | Wt 237.0 lb

## 2024-04-23 DIAGNOSIS — M5416 Radiculopathy, lumbar region: Secondary | ICD-10-CM

## 2024-04-23 DIAGNOSIS — M1711 Unilateral primary osteoarthritis, right knee: Secondary | ICD-10-CM

## 2024-04-23 DIAGNOSIS — M19041 Primary osteoarthritis, right hand: Secondary | ICD-10-CM

## 2024-04-23 DIAGNOSIS — M25531 Pain in right wrist: Secondary | ICD-10-CM | POA: Diagnosis not present

## 2024-04-23 NOTE — Assessment & Plan Note (Signed)
 Recurrent lumbar radiculopathy.  Patient states that the testicular pain after the epidural at the previous level was completely gone and then slowly has returned.  Do feel at this point to repeat this.  Differential includes the inguinal hernia that patient has but at the moment they still want to continue to monitor.  Discussed with patient we will see how patient responds after this injection.  Discussed icing regimen.  Follow-up again with me in 6 to 8 weeks after the injection.

## 2024-04-23 NOTE — Assessment & Plan Note (Signed)
 DIP right hand.  Discussed the potential of different potential bracing.  Will try a stack splint.  Can do injection if needed.  Discussed icing regimen, patient does have trigger fingers as well as carpal tunnel that can also be contributing.  Patient wants to hold on any injection on those areas.  Feels like that is not what is contributing to as much is the discomfort as well at the moment.  Follow-up with me again in 6 to 12 weeks.

## 2024-04-23 NOTE — Patient Instructions (Addendum)
 Epidural [phone] Stack splint index finger See me again in 2-3 months

## 2024-04-23 NOTE — Assessment & Plan Note (Signed)
Stable at the moment.  Continue to monitor.

## 2024-04-28 ENCOUNTER — Telehealth: Payer: Self-pay | Admitting: Internal Medicine

## 2024-04-28 NOTE — Telephone Encounter (Unsigned)
 Copied from CRM #[id number]. Topic: Clinical - Medication Refill >> Apr 28, 2024 11:54 AM Avram MATSU wrote: Medication:   Tamsulosin  (FLOMAX ) 0.4 MG CAPS capsule [[id number]]  lisinopril  (ZESTRIL ) 5 MG tablet [[id number]]  Stated he was on cyclobenzaprine  (FLEXERIL ) 5 MG tablet  and would like to switch back if possible   Has the patient contacted their pharmacy? Yes (Agent: If no, request that the patient contact the pharmacy for the refill. If patient does not wish to contact the pharmacy document the reason why and proceed with request.) (Agent: If yes, when and what did the pharmacy advise?)  This is the patient's preferred pharmacy:  [facility] [address] [place], [place] - [address] [place] [address] Phone: [phone] Fax: [phone]    Is this the correct pharmacy for this prescription? Yes If no, delete pharmacy and type the correct one.   Has the prescription been filled recently? No  Is the patient out of the medication? No only Tamsulosin   Has the patient been seen for an appointment in the last year OR does the patient have an upcoming appointment? Yes  Can we respond through MyChart? Yes  Agent: Please be advised that Rx refills may take up to 3 business days. We ask that you follow-up with your pharmacy.

## 2024-04-29 ENCOUNTER — Ambulatory Visit: Payer: Self-pay

## 2024-04-29 ENCOUNTER — Other Ambulatory Visit: Payer: Self-pay | Admitting: Internal Medicine

## 2024-04-29 ENCOUNTER — Other Ambulatory Visit: Payer: Self-pay | Admitting: "Endocrinology

## 2024-04-29 DIAGNOSIS — Z794 Long term (current) use of insulin: Secondary | ICD-10-CM

## 2024-04-29 MED ORDER — LISINOPRIL 5 MG PO TABS: 5.0000 mg | ORAL_TABLET | Freq: Every day | ORAL | 3 refills | Status: AC

## 2024-04-29 MED ORDER — TAMSULOSIN HCL 0.4 MG PO CAPS: 0.4000 mg | ORAL_CAPSULE | Freq: Every day | ORAL | 11 refills | Status: AC

## 2024-04-29 NOTE — Telephone Encounter (Signed)
    Copied from patient calls basket:  Copied from CRM #[id number]. Topic: Clinical - Medication Refill >> Apr 28, 2024 11:54 AM Steven [name] wrote: Medication:    Tamsulosin  (FLOMAX ) 0.4 MG CAPS capsule [[id number]]   lisinopril  (ZESTRIL ) 5 MG tablet [[id number]]   Stated he was on cyclobenzaprine  (FLEXERIL ) 5 MG tablet  and would like to switch back if possible     Has the patient contacted their pharmacy? Yes (Agent: If no, request that the patient contact the pharmacy for the refill. If patient does not wish to contact the pharmacy document the reason why and proceed with request.) (Agent: If yes, when and what did the pharmacy advise?)   This is the patient's preferred pharmacy:  [facility] [address] [place], [place] - [address] [place] [address] Phone: [phone] Fax: [phone]       Is this the correct pharmacy for this prescription? Yes If no, delete pharmacy and type the correct one.    Has the prescription been filled recently? No   Is the patient out of the medication? No only Tamsulosin    Has the patient been seen for an appointment in the last year OR does the patient have an upcoming appointment? Yes   Can we respond through MyChart? Yes   Agent: Please be advised that Rx refills may take up to 3 business days. We ask that you follow-up with your pharmacy.

## 2024-04-29 NOTE — Telephone Encounter (Signed)
 Reason for Disposition . Message left on identified voice mail . Third attempt to contact caller AND no contact made. Phone number verified.  Answer Assessment - Initial Assessment Questions See separate refill encounter for requested refills.  3 unsuccessful attempts to reach patient for triage of requested new med Flexaril. Routing for follow up.  Protocols used: No Contact or Duplicate Contact Call-A-AH

## 2024-04-29 NOTE — Telephone Encounter (Signed)
 Unable to convert to nurse triage encounter. Signing to close, See nurse triage for details of call.

## 2024-04-29 NOTE — Telephone Encounter (Signed)
 Requested Prescriptions   Pending Prescriptions Disp Refills   insulin  NPH Human (HUMULIN  N) 100 UNIT/ML injection [Pharmacy Med Name: HumuLIN  N 100 UNIT/ML Subcutaneous Suspension] 60 mL 0    Sig: INJECT UP TO 30 UNITS IN THE MORNING AND 40 UNITS AT BEDTIME

## 2024-04-30 ENCOUNTER — Other Ambulatory Visit: Payer: Self-pay | Admitting: Internal Medicine

## 2024-04-30 MED ORDER — CYCLOBENZAPRINE HCL 5 MG PO TABS: 5.0000 mg | ORAL_TABLET | Freq: Three times a day (TID) | ORAL | 1 refills | Status: AC | PRN

## 2024-04-30 NOTE — Telephone Encounter (Signed)
 Ok will send flexeril  prn - done erx

## 2024-04-30 NOTE — Telephone Encounter (Signed)
 See below

## 2024-05-02 ENCOUNTER — Telehealth: Payer: Self-pay

## 2024-05-02 NOTE — Telephone Encounter (Signed)
 Copied from CRM [id number]. Topic: Clinical - Medication Question >> May 02, 2024  9:48 AM Berneda FALCON wrote: Reason for CRM: Ronal Fellows from [facility] pharmacy state patient is currently on methocarbamol  (ROBAXIN ) 500 MG table and just picked this up last month, but was prescribed cyclobenzaprine  (FLEXERIL ) 5 MG tablets as well and wants to confirm that he is on 2 different muscle relaxer or if they should discontinue the methocarbamol  (ROBAXIN ) 500 MG tablets?  Callback is [phone]

## 2024-05-05 NOTE — Telephone Encounter (Signed)
 Ok to discontinue the robaxin , as this was not helping well.   thanks

## 2024-05-06 NOTE — Telephone Encounter (Signed)
Called and let Pharmacy know.

## 2024-05-08 ENCOUNTER — Ambulatory Visit: Admitting: Podiatry

## 2024-05-08 ENCOUNTER — Encounter: Payer: Self-pay | Admitting: Podiatry

## 2024-05-08 DIAGNOSIS — E1140 Type 2 diabetes mellitus with diabetic neuropathy, unspecified: Secondary | ICD-10-CM | POA: Diagnosis not present

## 2024-05-08 DIAGNOSIS — B353 Tinea pedis: Secondary | ICD-10-CM | POA: Diagnosis not present

## 2024-05-08 DIAGNOSIS — M79675 Pain in left toe(s): Secondary | ICD-10-CM | POA: Diagnosis not present

## 2024-05-08 DIAGNOSIS — M79674 Pain in right toe(s): Secondary | ICD-10-CM

## 2024-05-08 DIAGNOSIS — B351 Tinea unguium: Secondary | ICD-10-CM | POA: Diagnosis not present

## 2024-05-08 MED ORDER — TERBINAFINE HCL 1 % EX CREA: 1.0000 | TOPICAL_CREAM | Freq: Two times a day (BID) | CUTANEOUS | 1 refills | Status: AC

## 2024-05-08 NOTE — Progress Notes (Unsigned)
°  Subjective:  Patient ID: Steven [name], male    DOB: [birth year],  MRN: [id number]  Chief Complaint  Patient presents with   [facility]    [facility] no calluses A1c 8.3. no anti coag    63 y.o. male presents with the above complaint. History confirmed with patient. Patient presenting with pain related to dystrophic thickened elongated nails. Patient is unable to trim own nails related to nail dystrophy and/or mobility issues. Patient does have a history of T2DM.  Last A1c 8.3.  He has been on some steroids for his back.   Objective:  Physical Exam: warm, good capillary refill, decreased pedal hair growth nail exam onychomycosis of the toenails, onycholysis, and dystrophic nails greater than 3 mm thickening.  Right hallux nail plate starting to show signs of loosening with horizontal crack in the level of the nail plate, evidence of nail growth underneath the loose nail. DP pulses palpable, PT pulses palpable, protective sensation absent, and vibratory sensation diminished Left Foot:  Pain with palpation of nails due to elongation and dystrophic growth.  Maceration and buildup of dead skin 3rd and 4th interspaces Right Foot: Pain with palpation of nails due to elongation and dystrophic growth. Maceration and buildup of dead skin 3rd and 4th interspaces  Assessment:   1. Tinea pedis of both feet   2. Pain due to onychomycosis of toenails of both feet   3. Type 2 diabetes, controlled, with neuropathy (HCC)      Plan:  Patient was evaluated and treated and all questions answered.  # Interdigital tinea pedis of 3rd and 4th webspaces bilaterally - Discussed importance of pedal hygiene treatment and maintenance of tinea pedis - Prescription for topical terbinafine  cream 1% to be applied 1-2 times a day sent to patient's pharmacy to be applied daily for 2 to 3 weeks - Monitor for excessive moisture between the toes - Follow-up in 3 to 4 weeks for this -I certify that this diagnosis represents a distinct  and separate diagnosis that requires evaluation and treatment separate from other procedures or diagnosis   #Onychomycosis with pain  -Nails palliatively debrided as below. -Educated on self-care - Monitor right first toenail for further signs of loosening.  Follow-up as needed if this worsens - Would hold off on total nail avulsion at this point due to increased A1c  Procedure: Nail Debridement Rationale: Pain Type of Debridement: manual, sharp debridement. Instrumentation: Nail nipper, rotary burr. Number of Nails: 10  Patient educated on diabetes. Discussed proper diabetic foot care and discussed risks and complications of disease. Educated patient in depth on reasons to return to the office immediately should he/she discover anything concerning or new on the feet. All questions answered. Discussed proper shoes as well.    Return in about 4 weeks (around 06/05/2024) for Tinea pedis.         Ethan Saddler, DPM [facility] / [facility]

## 2024-05-09 ENCOUNTER — Encounter: Payer: Self-pay | Admitting: "Endocrinology

## 2024-05-09 ENCOUNTER — Ambulatory Visit: Admitting: "Endocrinology

## 2024-05-09 VITALS — BP 120/80 | HR 85 | Ht 68.0 in | Wt 232.0 lb

## 2024-05-09 DIAGNOSIS — Z7984 Long term (current) use of oral hypoglycemic drugs: Secondary | ICD-10-CM

## 2024-05-09 DIAGNOSIS — E1165 Type 2 diabetes mellitus with hyperglycemia: Secondary | ICD-10-CM

## 2024-05-09 DIAGNOSIS — Z7985 Long-term (current) use of injectable non-insulin antidiabetic drugs: Secondary | ICD-10-CM

## 2024-05-09 DIAGNOSIS — Z794 Long term (current) use of insulin: Secondary | ICD-10-CM | POA: Diagnosis not present

## 2024-05-09 DIAGNOSIS — E782 Mixed hyperlipidemia: Secondary | ICD-10-CM | POA: Diagnosis not present

## 2024-05-09 NOTE — Patient Instructions (Addendum)
 Recommend:  Trulicity  4.5 mg/week  Farxiga  10 mg daily, metformin  1 g twice daily, glipizide  XL 10 mg bid  NPH 42 units before break fast and 40 units before supper (take 15 min before break fast and dinner) Humalog  6-10 units 15 min before break fast Humalog  kwikpen correction scale: Use as needed based on blood sugars as follows: 151 - 175: 1 unit 176 - 200: 2 units 201 - 225: 3 units 226 - 250: 4 units 251 - 275: 5 units 276 - 300: 6 units 301 - 325: 7 units 326 - 350: 8 units 351 - 375: 9 units 376 - 400: 10 units

## 2024-05-09 NOTE — Discharge Instructions (Signed)

## 2024-05-09 NOTE — Progress Notes (Signed)
 Outpatient Endocrinology Note Steven Birmingham, MD  05/09/2024   Steven [name] Dec 09-24-1960 [id number]  Referring Provider: Norleen [name] ORN, MD Primary Care Provider: Norleen [name] ORN, MD Reason for consultation: Subjective   Assessment & Plan  Diagnoses and all orders for this visit:  Uncontrolled type 2 diabetes mellitus with hyperglycemia, with long-term current use of insulin  (HCC)  Long term (current) use of oral hypoglycemic drugs  Long-term insulin  use (HCC)  Insulin  dose changed (HCC)  Long-term (current) use of injectable non-insulin  antidiabetic drugs  Mixed hypercholesterolemia and hypertriglyceridemia    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, neuropathy  Lab Results  Component Value Date   GFR 61.55 02/21/2024   Hba1c goal less than 7, current Hba1c is  Lab Results  Component Value Date   HGBA1C 8.3 (H) 04/01/2024   Recommend:  C-peptide + Trulicity  4.5 mg/week ->switch to mounjaro   Farxiga  10 mg daily, metformin  1 g twice daily, glipizide  XL 10 mg bid  NPH 42 units before break fast and 40 units before supper (take 15 min before break fast and dinner) Humalog  6-10 units 15 min before break fast Humalog  kwikpen correction scale: Use as needed based on blood sugars as follows: 151 - 175: 1 unit 176 - 200: 2 units 201 - 225: 3 units 226 - 250: 4 units 251 - 275: 5 units 276 - 300: 6 units 301 - 325: 7 units 326 - 350: 8 units 351 - 375: 9 units 376 - 400: 10 units   Goal is to decrease humalog  insulin  once settled on Trulicity   Check BG before meals Ordered DM education and instructed pt to bring all insulins to help with timings of intake as patient doesn't remember names of insulin   Use skin tac to allow dexcom to stay put  No known contraindications to any of above medications Glucagon  discussed and prescribed with refills on 12/04/22  -Last LD and Tg are as follows: Lab Results  Component Value Date   LDLCALC 82 04/01/2024    Lab  Results  Component Value Date   TRIG 205 (H) 04/01/2024   -on lovastatin  40 mg every day -Follow low fat diet and exercise   -Blood pressure goal <140/90 - Microalbumin/creatinine goal < 30 -Last MA/Cr is as follows: Lab Results  Component Value Date   MICROALBUR <0.2 10/02/2023   -on ACE/ARB lisinopril  2.5 mg every day -diet changes including salt restriction -limit eating outside -counseled BP targets per standards of diabetes care -uncontrolled blood pressure can lead to retinopathy, nephropathy and cardiovascular and atherosclerotic heart disease  Reviewed and counseled on: -A1C target -Blood sugar targets -Complications of uncontrolled diabetes  -Checking blood sugar before meals and bedtime and bring log next visit -All medications with mechanism of action and side effects -Hypoglycemia management: rule of 15's, Glucagon  Emergency Kit and medical alert ID -low-carb low-fat plate-method diet -At least 20 minutes of physical activity per day -Annual dilated retinal eye exam and foot exam -compliance and follow up needs -follow up as scheduled or earlier if problem gets worse  Call if blood sugar is less than 70 or consistently above 250    Take a 15 gm snack of carbohydrate at bedtime before you go to sleep if your blood sugar is less than 100.    If you are going to fast after midnight for a test or procedure, ask your physician for instructions on how to reduce/decrease your insulin  dose.    Call if blood sugar is less than  70 or consistently above 250  -Treating a low sugar by rule of 15  (15 gms of sugar every 15 min until sugar is more than 70) If you feel your sugar is low, test your sugar to be sure If your sugar is low (less than 70), then take 15 grams of a fast acting Carbohydrate (3-4 glucose tablets or glucose gel or 4 ounces of juice or regular soda) Recheck your sugar 15 min after treating low to make sure it is more than 70 If sugar is still less than  70, treat again with 15 grams of carbohydrate          Don't drive the hour of hypoglycemia  If unconscious/unable to eat or drink by mouth, use glucagon  injection or nasal spray baqsimi and call 911. Can repeat again in 15 min if still unconscious.  Return in about 2 months (around 07/10/2024).   I have reviewed current medications, nurse's notes, allergies, vital signs, past medical and surgical history, family medical history, and social history for this encounter. Counseled patient on symptoms, examination findings, lab findings, imaging results, treatment decisions and monitoring and prognosis. The patient understood the recommendations and agrees with the treatment plan. All questions regarding treatment plan were fully answered.  Steven Birmingham, MD  05/09/2024  History of Present Illness Steven [name] is a 63 y.o. year old male who presents for follow up of Type II diabetes mellitus.  Steven [name] was first diagnosed in 2010.   Diabetes education +  Home diabetes regimen: Farxiga  10 mg daily, metformin  1 g twice daily, glipizide  XL 10 mg bid  NPH 42 units qpm and 40 qam  Humalog  correction scale  Trulicity  4.5 mg/week  Previous history:     Previously on metformin  and started on insulin  in 10/21 He has only been on NPH A1c range in the last few years: 8.1-12.1  COMPLICATIONS -  MI/Stroke -  retinopathy +  neuropathy +  nephropathy  BLOOD SUGAR DATA CGM interpretation: At today's visit, we reviewed her CGM downloads. The full report is scanned in the media. Reviewing the CGM trends, BG are high after break fast and some after supper/overnight.   Physical Exam  BP 120/80   Pulse 85   Ht 5' 8 (1.727 m)   Wt 232 lb (105.2 kg)   SpO2 97%   BMI 35.28 kg/m    Constitutional: well developed, well nourished Head: normocephalic, atraumatic Eyes: sclera anicteric, no redness Neck: supple Lungs: normal respiratory effort Neurology: alert and oriented Skin: dry, no  appreciable rashes Musculoskeletal: no appreciable defects Psychiatric: normal mood and affect Diabetic Foot Exam - Simple   No data filed      Current Medications Patient's Medications  New Prescriptions   No medications on file  Previous Medications   ALLOPURINOL  (ZYLOPRIM ) 100 MG TABLET    Take 1 tablet (100 mg total) by mouth 2 (two) times daily.   CONTINUOUS GLUCOSE SENSOR (DEXCOM G7 SENSOR) MISC    1 Device by Does not apply route as directed. Change sensor every 10 days   CYCLOBENZAPRINE  (FLEXERIL ) 5 MG TABLET    Take 1 tablet (5 mg total) by mouth 3 (three) times daily as needed.   DICLOFENAC  SODIUM (VOLTAREN ) 1 % GEL    Apply 2 g topically 4 (four) times daily.   FARXIGA  10 MG TABS TABLET    Take 1 tablet by mouth once daily   FERROUS SULFATE  324 (65 FE) MG TBEC    Take 1  tablet (325 mg total) by mouth daily.   GABAPENTIN  (NEURONTIN ) 100 MG CAPSULE    Take 2 capsules (200 mg total) by mouth at bedtime.   GLIPIZIDE  (GLUCOTROL  XL) 10 MG 24 HR TABLET    Take 1 tablet (10 mg total) by mouth 2 (two) times daily.   HUMALOG  KWIKPEN 100 UNIT/ML KWIKPEN    INJECT 22 UNITS AFTER BREAKFAST AND 18 UNITS AFTER DINNER DAILY.   HYDROCHLOROTHIAZIDE  (HYDRODIURIL ) 25 MG TABLET    TAKE 1 TABLET BY MOUTH EVERY DAY   IBUPROFEN  (ADVIL ) 600 MG TABLET    Take 600 mg by mouth every 6 (six) hours as needed.   INSULIN  ASPART (NOVOLOG  FLEXPEN) 100 UNIT/ML FLEXPEN    Inject 25 Units into the skin daily before breakfast.   INSULIN  NPH HUMAN (HUMULIN  N) 100 UNIT/ML INJECTION    INJECT UP TO 30 UNITS IN THE MORNING AND 40 UNITS AT BEDTIME   INSULIN  SYRINGE-NEEDLE U-100 (RELION INSULIN  SYRINGE) 31G X 15/64 0.5 ML MISC    Use to inject insulin  twice a day.   IRON  POLYSACCHARIDES (NU-IRON ) 150 MG CAPSULE    Take 1 capsule (150 mg total) by mouth daily.   LISINOPRIL  (ZESTRIL ) 5 MG TABLET    Take 1 tablet (5 mg total) by mouth daily.   LOVASTATIN  (MEVACOR ) 40 MG TABLET    TAKE 1 TABLET BY MOUTH EVERYDAY AT BEDTIME    MELOXICAM  (MOBIC ) 15 MG TABLET    TAKE 1 TABLET BY MOUTH ONCE DAILY AS NEEDED FOR PAIN   METFORMIN  (GLUCOPHAGE ) 1000 MG TABLET    TAKE 1 TABLET BY MOUTH TWICE A DAY WITH FOOD   METHOCARBAMOL  (ROBAXIN ) 500 MG TABLET    Take 1 tablet (500 mg total) by mouth 4 (four) times daily.   NEOMYCIN -POLYMYXIN-HYDROCORTISONE (CORTISPORIN) 3.5-10000-1 OTIC SUSPENSION    Apply 1-2 drops daily after soaking and cover with bandaid   OSTOMY SUPPLIES (SKIN TAC ADHESIVE BARRIER WIPE) MISC    Use to help apply Dexcom sensor every 10 days   OVER THE COUNTER MEDICATION    Take 1 tablet by mouth 2 (two) times daily. Beet Root supplement   PANTOPRAZOLE  (PROTONIX ) 40 MG TABLET    Take 1 tablet (40 mg total) by mouth daily.   RELION PEN NEEDLE 31G/8MM 31G X 8 MM MISC    USE AS DIRECTED TWICE DAILY   SOLIFENACIN  (VESICARE ) 5 MG TABLET    Take 1 tablet (5 mg total) by mouth daily.   TAMSULOSIN  (FLOMAX ) 0.4 MG CAPS CAPSULE    Take 1 capsule (0.4 mg total) by mouth daily.   TERBINAFINE (LAMISIL) 1 % CREAM    Apply 1 Application topically 2 (two) times daily.   TIRZEPATIDE  (MOUNJARO ) 5 MG/0.5ML PEN    Inject 5 mg into the skin once a week.   TRAMADOL  (ULTRAM ) 50 MG TABLET    Take 1 tablet (50 mg total) by mouth every 6 (six) hours as needed.  Modified Medications   No medications on file  Discontinued Medications   No medications on file    Allergies No Known Allergies  Past Medical History Past Medical History:  Diagnosis Date   Chicken pox    Diabetes mellitus without complication (HCC)    Essential hypertension    GERD (gastroesophageal reflux disease)    Gouty arthritis    Hypertension    Iron  deficiency anemia    Vitamin D  deficiency     Past Surgical History Past Surgical History:  Procedure Laterality Date  BIOPSY  12/05/2021   Procedure: BIOPSY;  Surgeon: Legrand Victory LITTIE DOUGLAS, MD;  Location: [facility];  Service: Gastroenterology;;   ESOPHAGOGASTRODUODENOSCOPY N/A 12/05/2021   Procedure:  ESOPHAGOGASTRODUODENOSCOPY (EGD);  Surgeon: Legrand Victory LITTIE DOUGLAS, MD;  Location: [facility];  Service: Gastroenterology;  Laterality: N/A;   HOT HEMOSTASIS N/A 12/05/2021   Procedure: HOT HEMOSTASIS (ARGON PLASMA COAGULATION/BICAP);  Surgeon: Legrand Victory LITTIE DOUGLAS, MD;  Location: [facility];  Service: Gastroenterology;  Laterality: N/A;   INGUINAL HERNIA REPAIR Right 12/19/2017   Procedure: HERNIA REPAIR INGUINAL RIGHT;  Surgeon: Vernetta Berg, MD;  Location: [facility];  Service: General;  Laterality: Right;   INSERTION OF MESH Right 12/19/2017   Procedure: INSERTION OF MESH;  Surgeon: Vernetta Berg, MD;  Location: [facility];  Service: General;  Laterality: Right;   MASS EXCISION Right 12/19/2017   Procedure: EXCISION OF RIGHT SCROTAL MASS;  Surgeon: Vernetta Berg, MD;  Location: [facility];  Service: General;  Laterality: Right;   TONSILLECTOMY      Family History family history includes Diabetes in his mother; Heart disease in his mother; Hypertension in his father and mother; Stroke in his brother and father.  Social History Social History   Socioeconomic History   Marital status: Divorced    Spouse name: Not on file   Number of children: 1   Years of education: 12   Highest education level: 12th grade  Occupational History   Occupation: retired  Tobacco Use   Smoking status: Former    Current packs/day: 2.00    Average packs/day: 2.0 packs/day for 25.0 years (50.0 ttl pk-yrs)    Types: Cigarettes   Smokeless tobacco: Former    Types: Engineer, Drilling   Vaping status: Never Used  Substance and Sexual Activity   Alcohol use: Not Currently   Drug use: No   Sexual activity: Yes    Birth control/protection: Condom  Other Topics Concern   Not on file  Social History Narrative   Fun: Just working   Social Drivers of Health   Tobacco Use: Medium Risk (05/09/2024)   Patient History    Smoking Tobacco Use: Former    Smokeless Tobacco Use: Former    Passive Exposure: Not on  Actuary Strain: Low Risk (05/16/2023)   Overall Financial Resource Strain (CARDIA)    Difficulty of Paying Living Expenses: Not very hard  Recent Concern: Financial Resource Strain - Medium Risk (04/07/2023)   Overall Financial Resource Strain (CARDIA)    Difficulty of Paying Living Expenses: Somewhat hard  Food Insecurity: No Food Insecurity (05/16/2023)   Hunger Vital Sign    Worried About Running Out of Food in the Last Year: Never true    Ran Out of Food in the Last Year: Never true  Recent Concern: Food Insecurity - Food Insecurity Present (04/07/2023)   Hunger Vital Sign    Worried About Running Out of Food in the Last Year: Sometimes true    Ran Out of Food in the Last Year: Sometimes true  Transportation Needs: No Transportation Needs (05/16/2023)   PRAPARE - Administrator, Civil Service (Medical): No    Lack of Transportation (Non-Medical): No  Physical Activity: Insufficiently Active (05/16/2023)   Exercise Vital Sign    Days of Exercise per Week: 2 days    Minutes of Exercise per Session: 10 min  Stress: No Stress Concern Present (05/16/2023)   [employer] of Occupational Health - Occupational Stress Questionnaire    Feeling  of Stress : Not at all  Social Connections: Moderately Isolated (05/16/2023)   Social Connection and Isolation Panel    Frequency of Communication with Friends and Family: More than three times a week    Frequency of Social Gatherings with Friends and Family: Once a week    Attends Religious Services: 1 to 4 times per year    Active Member of Clubs or Organizations: No    Attends Engineer, Structural: Not on file    Marital Status: Divorced  Intimate Partner Violence: Not on file  Depression (PHQ2-9): Low Risk (10/24/2023)   Depression (PHQ2-9)    PHQ-2 Score: 0  Alcohol Screen: Low Risk (05/16/2023)   Alcohol Screen    Last Alcohol Screening Score (AUDIT): 1  Housing: High Risk (05/16/2023)   Housing  Stability Vital Sign    Unable to Pay for Housing in the Last Year: No    Number of Times Moved in the Last Year: Not on file    Homeless in the Last Year: Yes  Utilities: Not on file  Health Literacy: Not on file    Lab Results  Component Value Date   HGBA1C 8.3 (H) 04/01/2024   HGBA1C 7.7 (A) 01/01/2024   HGBA1C 8.8 (H) 10/24/2023   Lab Results  Component Value Date   CHOL 155 04/01/2024   Lab Results  Component Value Date   HDL 43 04/01/2024   Lab Results  Component Value Date   LDLCALC 82 04/01/2024   Lab Results  Component Value Date   TRIG 205 (H) 04/01/2024   Lab Results  Component Value Date   CHOLHDL 3.6 04/01/2024   Lab Results  Component Value Date   CREATININE 1.23 02/21/2024   Lab Results  Component Value Date   GFR 61.55 02/21/2024   Lab Results  Component Value Date   MICROALBUR <0.2 10/02/2023      Component Value Date/Time   NA 137 02/21/2024 1117   K 4.0 02/21/2024 1117   CL 102 02/21/2024 1117   CO2 24 02/21/2024 1117   GLUCOSE 236 (H) 02/21/2024 1117   BUN 16 02/21/2024 1117   CREATININE 1.23 02/21/2024 1117   CALCIUM 9.6 02/21/2024 1117   PROT 7.4 02/21/2024 1117   ALBUMIN 4.4 02/21/2024 1117   AST 16 02/21/2024 1117   ALT 29 02/21/2024 1117   ALKPHOS 67 02/21/2024 1117   BILITOT 0.3 02/21/2024 1117   GFRNONAA 54 (L) 10/18/2023 1930   GFRAA >60 01/30/2018 2037      Latest Ref Rng & Units 02/21/2024   11:17 AM 10/24/2023    2:52 PM 10/18/2023    7:30 PM  BMP  Glucose 70 - 99 mg/dL 763  792  857   BUN 6 - 23 mg/dL 16  33  21   Creatinine 0.40 - 1.50 mg/dL 8.76  8.44  8.55   Sodium 135 - 145 mEq/L 137  126  135   Potassium 3.5 - 5.1 mEq/L 4.0  4.4  4.6   Chloride 96 - 112 mEq/L 102  89  100   CO2 19 - 32 mEq/L 24  27  23    Calcium 8.4 - 10.5 mg/dL 9.6  9.8  9.9        Component Value Date/Time   WBC 7.3 02/21/2024 1117   RBC 5.20 02/21/2024 1117   HGB 13.3 02/21/2024 1117   HCT 41.7 02/21/2024 1117   PLT 349.0  02/21/2024 1117   MCV 80.1 02/21/2024 1117  MCH 25.0 (L) 10/18/2023 1930   MCHC 31.8 02/21/2024 1117   RDW 19.7 (H) 02/21/2024 1117   LYMPHSABS 2.8 02/21/2024 1117   MONOABS 0.5 02/21/2024 1117   EOSABS 0.2 02/21/2024 1117   BASOSABS 0.0 02/21/2024 1117     Parts of this note may have been dictated using voice recognition software. There may be variances in spelling and vocabulary which are unintentional. Not all errors are proofread. Please notify the dino if any discrepancies are noted or if the meaning of any statement is not clear.

## 2024-05-12 ENCOUNTER — Inpatient Hospital Stay: Admission: RE | Admit: 2024-05-12 | Discharge: 2024-05-12 | Attending: Family Medicine | Admitting: Family Medicine

## 2024-05-12 DIAGNOSIS — M5416 Radiculopathy, lumbar region: Secondary | ICD-10-CM

## 2024-05-12 MED ORDER — IOPAMIDOL (ISOVUE-M 200) INJECTION 41%
1.0000 mL | Freq: Once | INTRAMUSCULAR | Status: AC
  Administered 2024-05-12: 12:00:00 1 mL via EPIDURAL

## 2024-05-12 MED ORDER — METHYLPREDNISOLONE ACETATE 40 MG/ML INJ SUSP (RADIOLOG
80.0000 mg | Freq: Once | INTRAMUSCULAR | Status: AC
  Administered 2024-05-12: 12:00:00 80 mg via EPIDURAL

## 2024-05-15 ENCOUNTER — Telehealth: Payer: Self-pay | Admitting: Podiatry

## 2024-05-15 NOTE — Telephone Encounter (Signed)
 Patient called in regards to the athlete's foot medication that the provider recommends. He would like a call from someone in regards to this.

## 2024-05-16 ENCOUNTER — Encounter: Payer: Self-pay | Admitting: Dietician

## 2024-05-16 ENCOUNTER — Ambulatory Visit: Admitting: Dietician

## 2024-05-16 VITALS — Wt 233.0 lb

## 2024-05-16 DIAGNOSIS — E1142 Type 2 diabetes mellitus with diabetic polyneuropathy: Secondary | ICD-10-CM | POA: Diagnosis present

## 2024-05-16 NOTE — Patient Instructions (Signed)
 Choose water.  Each 16 oz soda has 16 teaspoons of sugar.  Continue to stay active.

## 2024-05-16 NOTE — Progress Notes (Signed)
 233    Diabetes Self-Management Education  Visit Type: Follow-up  Appt. Start Time: 0910 Appt. End Time: 0945  05/16/2024  [name], identified by name and date of birth, is a 63 y.o. male with a diagnosis of Diabetes:  .   ASSESSMENT  Patient is here today alone.  He was last seen by this RD on 02/14/2024.  Patient states that Trulicity  made him sick for 3 days.  Switched to Mounjaro  and states that this made him sick for 1 day.  He states that he does not want to try it again. NPH 42 units before breakfast and 40 units before dinner.  Humalog  6-10 units before breakfast (generally before coffee) and sliding scale as needed (1 unit for every 25 points above 150). He states that he is now doing better remembering to take his insulin  15 minutes before meals. Knee still bothers him.  Steroid shot in his back Monday and he states that it has not impacted his blood glucose at this time. Works all day but does not exercise in addition. Dexcom Clarity report reviewed.  We did not the impact of regular soda and cake on his blood glucose and reviewed with patient.  History includes:  Type 2 Diabetes, HTN, history of vitamin D  deficiency, iron  deficient anemia Blood Glucose Meter:  Relion Premier Medications include Farxiga , Metformin , Glipizide , Trulicity , Humulin  N 42 q am and 35 q HS, Humalog  per sliding scale, iron , Vitamin D , Vitamin B Last A1C 8.3% 04/01/2024, 7.7% 01/01/2024, c-peptide 7.02 on 04/20/2023, eGFR 60 06/04/2023, Vitamin D  120 on 04/09/23, Vitamin B-12 331 09/19/2022 CGM:  Dexcom G7 -    CGM Results from download: 11/08/2023 02/14/2024 05/16/24  % Time CGM active:   85 %   (Goal >70%) 94 94  Average glucose:   112 mg/dL for 14 days [id number]  Glucose management indicator:   6 % 7.3% 7.2  Time in range (70-180 mg/dL):   85 %   (Goal >29%) 62 66  Time High (181-250 mg/dL):   5 %   (Goal < 74%) 30 26  Time Very High (>250 mg/dL):    0 %   (Goal < 5%) 8 7  Time Low  (54-69 mg/dL):   6 %   (Goal <5%) 0 1  Time Very Low (<54 mg/dL):   4 %   (Goal <8%) <1 <1  %CV (glucose variability)    31.6 %  (Goal <36%) 31.6 33.5    66 233 lbs 02/14/2024 and 05/16/2024 240 lbs 11/08/2023 243 lbs 08/09/2023 232 lbs 2021   Social hx:  Intermittently homeless.  No refrigerator and no stove where he lives.  He can reheat his food.  Eating out most of the time. He works holiday representative at times. He has a car.  Weight 233 lb (105.7 kg). Body mass index is 35.43 kg/m.   Diabetes Self-Management Education - 05/16/24 1227       Visit Information   Visit Type Follow-up      Health Coping   How would you rate your overall health? Good      Psychosocial Assessment   Patient Belief/Attitude about Diabetes Motivated to manage diabetes    What is the hardest part about your diabetes right now, causing you the most concern, or is the most worrisome to you about your diabetes?   Making healty food and beverage choices    Self-care barriers Lack of material resources    Self-management support Doctor's office;CDE visits  Other persons present Patient    Patient Concerns Nutrition/Meal planning;Problem Solving;Glycemic Control;Support    Special Needs None    Preferred Learning Style No preference indicated    Learning Readiness Ready    How often do you need to have someone help you when you read instructions, pamphlets, or other written materials from your doctor or pharmacy? 1 - Never      Pre-Education Assessment   Patient understands the diabetes disease and treatment process. Needs Review    Patient understands incorporating nutritional management into lifestyle. Needs Review    Patient undertands incorporating physical activity into lifestyle. Needs Review    Patient understands using medications safely. Needs Review    Patient understands monitoring blood glucose, interpreting and using results Needs Review    Patient understands prevention, detection, and  treatment of acute complications. Needs Review    Patient understands prevention, detection, and treatment of chronic complications. Needs Review    Patient understands how to develop strategies to address psychosocial issues. Needs Review    Patient understands how to develop strategies to promote health/change behavior. Needs Review      Complications   Last HgB A1C per patient/outside source 8.3 %   04/01/2024 increased from 7.7.% 01/01/2024     Dietary Intake   Breakfast Fried chicken breast and thigh, cabbage, green beans, small roll.    Snack (morning) none    Lunch leftovers    Snack (afternoon) none    Dinner Party - chicken, cabbage, green beans, cake x 2    Snack (evening) regular soda and cake    Beverage(s) Water, regular soda (32 oz), coffee with powdered creamer      Activity / Exercise   Activity / Exercise Type Light (walking / raking leaves)   work related     Patient Education   Previous Diabetes Education Yes   01/2024   Healthy Eating Meal options for control of blood glucose level and chronic complications.;Information on hints to eating out and maintain blood glucose control.    Being Active Role of exercise on diabetes management, blood pressure control and cardiac health.    Medications Reviewed patients medication for diabetes, action, purpose, timing of dose and side effects.    Monitoring Identified appropriate SMBG and/or A1C goals.;Daily foot exams    Chronic complications Relationship between chronic complications and blood glucose control;Identified and discussed with patient  current chronic complications    Diabetes Stress and Support Identified and addressed patients feelings and concerns about diabetes;Worked with patient to identify barriers to care and solutions    Lifestyle and Health Coping Lifestyle issues that need to be addressed for better diabetes care      Individualized Goals (developed by patient)   Nutrition General guidelines for healthy  choices and portions discussed    Physical Activity Exercise 5-7 days per week;30 minutes per day    Medications take my medication as prescribed    Monitoring  Consistenly use CGM    Problem Solving Medication consistency;Eating Pattern;Addressing barriers to behavior change    Reducing Risk examine blood glucose patterns;do foot checks daily;check ketones if blood glucose over 240mg /dL      Patient Self-Evaluation of Goals - Patient rates self as meeting previously set goals (% of time)   Nutrition 50 - 75 % (half of the time)    Physical Activity 50 - 75 % (half of the time)    Medications >75% (most of the time)    Monitoring >75% (most of  the time)    Problem Solving and behavior change strategies  >75% (most of the time)    Reducing Risk (treating acute and chronic complications) 50 - 75 % (half of the time)    Health Coping 50 - 75 % (half of the time)      Post-Education Assessment   Patient understands the diabetes disease and treatment process. Demonstrates understanding / competency    Patient understands incorporating nutritional management into lifestyle. Needs Review    Patient undertands incorporating physical activity into lifestyle. Comprehends key points    Patient understands using medications safely. Comphrehends key points    Patient understands monitoring blood glucose, interpreting and using results Comprehends key points    Patient understands prevention, detection, and treatment of acute complications. Demonstrates understanding / competency    Patient understands prevention, detection, and treatment of chronic complications. Demonstrates understanding / competency    Patient understands how to develop strategies to address psychosocial issues. Demonstrates understanding / competency    Patient understands how to develop strategies to promote health/change behavior. Needs Review      Outcomes   Expected Outcomes Demonstrated interest in learning but significant  barriers to change    Future DMSE 3-4 months    Program Status Not Completed      Subsequent Visit   Since your last visit have you continued or begun to take your medications as prescribed? Yes    Since your last visit have you experienced any weight changes? No change          Individualized Plan for Diabetes Self-Management Training:   Learning Objective:  Patient will have a greater understanding of diabetes self-management. Patient education plan is to attend individual and/or group sessions per assessed needs and concerns.   Plan:   Patient Instructions  Choose water.  Each 16 oz soda has 16 teaspoons of sugar.  Continue to stay active.  Expected Outcomes:  Demonstrated interest in learning but significant barriers to change  Education material provided:   If problems or questions, patient to contact team via:  Phone  Future DSME appointment: 3-4 months

## 2024-05-19 NOTE — Telephone Encounter (Signed)
 I left a message on Answering machine letting him know what Dr Lamount charted regarding the terbinafine  1% cream, and to give us  a call back if he had any other questions.

## 2024-05-26 ENCOUNTER — Other Ambulatory Visit: Payer: Self-pay | Admitting: "Endocrinology

## 2024-05-26 NOTE — Telephone Encounter (Signed)
 Requested Prescriptions   Pending Prescriptions Disp Refills   RELION PEN NEEDLE 31G/8MM 31G X 8 MM MISC [Pharmacy Med Name: ReliOn Pen Needles 31G X 8 MM Miscellaneous] 150 each 0    Sig: USE AS DIRECTED TWICE  DAILY

## 2024-06-03 ENCOUNTER — Other Ambulatory Visit: Payer: Self-pay | Admitting: Internal Medicine

## 2024-06-03 NOTE — Telephone Encounter (Unsigned)
 Copied from CRM [id number]. Topic: Clinical - Medication Refill >> Jun 03, 2024 10:59 AM Carlyon D wrote: Medication: hydrochlorothiazide  (HYDRODIURIL ) 25 MG tablet  Has the patient contacted their pharmacy? Yes (Agent: If no, request that the patient contact the pharmacy for the refill. If patient does not wish to contact the pharmacy document the reason why and proceed with request.) (Agent: If yes, when and what did the pharmacy advise?)  This is the patient's preferred pharmacy:  [facility] [address], [place] - [address] [place] [address] Phone: [phone] Fax: [phone]  Is this the correct pharmacy for this prescription? Yes If no, delete pharmacy and type the correct one.   Has the prescription been filled recently? No  Is the patient out of the medication? Yes  Has the patient been seen for an appointment in the last year OR does the patient have an upcoming appointment? Yes  Can we respond through MyChart? Yes  Agent: Please be advised that Rx refills may take up to 3 business days. We ask that you follow-up with your pharmacy.

## 2024-06-04 ENCOUNTER — Other Ambulatory Visit: Payer: Self-pay | Admitting: "Endocrinology

## 2024-06-04 DIAGNOSIS — E1165 Type 2 diabetes mellitus with hyperglycemia: Secondary | ICD-10-CM

## 2024-06-04 MED ORDER — HYDROCHLOROTHIAZIDE 25 MG PO TABS: 25.0000 mg | ORAL_TABLET | Freq: Every day | ORAL | 11 refills | Status: AC

## 2024-06-04 NOTE — Telephone Encounter (Signed)
 Requested Prescriptions   Pending Prescriptions Disp Refills   FARXIGA  10 MG TABS tablet [Pharmacy Med Name: Farxiga  10 MG Oral Tablet] 90 tablet 0    Sig: Take 1 tablet by mouth once daily

## 2024-06-06 ENCOUNTER — Ambulatory Visit: Admitting: Podiatry

## 2024-06-12 ENCOUNTER — Encounter: Payer: Self-pay | Admitting: Podiatry

## 2024-06-12 ENCOUNTER — Ambulatory Visit: Admitting: Podiatry

## 2024-06-12 DIAGNOSIS — B353 Tinea pedis: Secondary | ICD-10-CM

## 2024-06-12 MED ORDER — TOLNAFTATE 1 % EX POWD: 1.0000 | Freq: Two times a day (BID) | CUTANEOUS | 1 refills | Status: AC

## 2024-06-12 NOTE — Progress Notes (Signed)
"  °  Subjective:  Patient ID: Steven [name], male    DOB: 13-Nov-1960,  MRN: [id number]  Chief Complaint  Patient presents with   Tinea Pedis    PT pharmacy said they did not have 0.01 terbinafine  cream. Pt has not been applying to feet.    Discussed the use of AI scribe software for clinical note transcription with the patient, who gave verbal consent to proceed.  History of Present Illness Steven [name] is a 64 year old male with diabetes who presents for follow-up of chronic interdigital tinea pedis.  He has had difficulty obtaining terbinafine  cream, though he knows it is available over the counter. He denies other foot complaints.  His diabetes is relevant to infection risk and foot care, but he reports no new diabetes-related foot problems today.  Last A1c 8.3.      Objective:    Physical Exam Palpable DP and PT pulses.  Cap refill intact.  Decreased pedal hair growth Muscle strength 5/5 in dorsiflexion, plantarflexion inversion and eversion.  Bilateral bunions noted Protective sensation decreased.  Vibratory sensation decreased. SKIN: Presence of macerated skin between the toes, predominantly between right foot fourth, third, second interspaces, left foot 3rd and 4th interspaces Nailplates mycotic in appearance.   No images are attached to the encounter.    Results    Assessment:   1. Tinea pedis of both feet      Plan:  Patient was evaluated and treated and all questions answered.  Assessment and Plan Assessment & Plan Tinea pedis Chronic interdigital tinea pedis, mild and persistent, requires ongoing management due to diabetes. - Discussed tolnaftate  powder as alternative to the terbinafine  cream if he has difficulty obtaining this.  Applied to the affected webspaces twice a day for 2 to 3 weeks. -Prescription sent in for the tolnaftate  powder -If he has a similar issue getting the RX filled given that this is an over-the-counter medication as well Lamisil  or  terbinafine  - Recommended twice daily application of antifungal powder or cream to web spaces and xerosis. - Advised on foot hygiene: change socks, use light fabrics, clean bathroom weekly. - Recommended antifungal spray or powder in shoes several times weekly. - Instructed to monitor for worsening or non-resolving symptoms and contact clinic if condition does not improve or worsens. - Scheduled follow-up in two months with next diabetic foot care        Return if symptoms worsen or fail to improve.    "

## 2024-06-18 ENCOUNTER — Telehealth: Payer: Self-pay

## 2024-06-18 NOTE — Telephone Encounter (Signed)
 Pt called to informed Dr Dartha that medication Farxiga  is to expensive. Please advise.

## 2024-06-19 ENCOUNTER — Other Ambulatory Visit: Payer: Self-pay

## 2024-06-19 DIAGNOSIS — E1165 Type 2 diabetes mellitus with hyperglycemia: Secondary | ICD-10-CM

## 2024-06-19 MED ORDER — EMPAGLIFLOZIN 25 MG PO TABS: 25.0000 mg | ORAL_TABLET | Freq: Every day | ORAL | 3 refills | Status: AC

## 2024-06-19 NOTE — Telephone Encounter (Signed)
 Jardiance  25 mg sent in. Pt informed.

## 2024-06-19 NOTE — Progress Notes (Unsigned)
 " [name] [address] [place] [address] Phone: [phone] Subjective:    I'm seeing this patient by the request  of:  Norleen Lynwood ORN, MD  CC:   YEP:Dlagzrupcz  04/23/2024 DIP right hand.  Discussed the potential of different potential bracing.  Will try a stack splint.  Can do injection if needed.  Discussed icing regimen, patient does have trigger fingers as well as carpal tunnel that can also be contributing.  Patient wants to hold on any injection on those areas.  Feels like that is not what is contributing to as much is the discomfort as well at the moment.  Follow-up with me again in 6 to 12 weeks.     Stable at the moment.  Continue to monitor.     Recurrent lumbar radiculopathy.  Patient states that the testicular pain after the epidural at the previous level was completely gone and then slowly has returned.  Do feel at this point to repeat this.  Differential includes the inguinal hernia that patient has but at the moment they still want to continue to monitor.  Discussed with patient we will see how patient responds after this injection.  Discussed icing regimen.  Follow-up again with me in 6 to 8 weeks after the injection.     Update 06/23/2024 Darvin Dials is a 64 y.o. male coming in with complaint of R wrist, lumbar spine, and R knee pain. Epidural on 05/12/2024. Patient states        Past Medical History:  Diagnosis Date   Chicken pox    Diabetes mellitus without complication (HCC)    Essential hypertension    GERD (gastroesophageal reflux disease)    Gouty arthritis    Hypertension    Iron  deficiency anemia    Vitamin D  deficiency    Past Surgical History:  Procedure Laterality Date   BIOPSY  12/05/2021   Procedure: BIOPSY;  Surgeon: Legrand Victory LITTIE DOUGLAS, MD;  Location: [facility];  Service: Gastroenterology;;   ESOPHAGOGASTRODUODENOSCOPY N/A 12/05/2021   Procedure: ESOPHAGOGASTRODUODENOSCOPY (EGD);  Surgeon: Legrand Victory LITTIE DOUGLAS, MD;  Location: [facility];  Service: Gastroenterology;  Laterality: N/A;   HOT HEMOSTASIS N/A 12/05/2021   Procedure: HOT HEMOSTASIS (ARGON PLASMA COAGULATION/BICAP);  Surgeon: Legrand Victory LITTIE DOUGLAS, MD;  Location: [facility];  Service: Gastroenterology;  Laterality: N/A;   INGUINAL HERNIA REPAIR Right 12/19/2017   Procedure: HERNIA REPAIR INGUINAL RIGHT;  Surgeon: Vernetta Berg, MD;  Location: [facility];  Service: General;  Laterality: Right;   INSERTION OF MESH Right 12/19/2017   Procedure: INSERTION OF MESH;  Surgeon: Vernetta Berg, MD;  Location: [facility];  Service: General;  Laterality: Right;   MASS EXCISION Right 12/19/2017   Procedure: EXCISION OF RIGHT SCROTAL MASS;  Surgeon: Vernetta Berg, MD;  Location: [facility];  Service: General;  Laterality: Right;   TONSILLECTOMY     Social History   Socioeconomic History   Marital status: Divorced    Spouse name: Not on file   Number of children: 1   Years of education: 12   Highest education level: 12th grade  Occupational History   Occupation: retired  Tobacco Use   Smoking status: Former    Current packs/day: 2.00    Average packs/day: 2.0 packs/day for 25.0 years (50.0 ttl pk-yrs)    Types: Cigarettes   Smokeless tobacco: Former    Types: Engineer, Drilling   Vaping status: Never Used  Substance and Sexual Activity  Alcohol use: Not Currently   Drug use: No   Sexual activity: Yes    Birth control/protection: Condom  Other Topics Concern   Not on file  Social History Narrative   Fun: Just working   Social Drivers of Health   Tobacco Use: Medium Risk (06/12/2024)   Patient History    Smoking Tobacco Use: Former    Smokeless Tobacco Use: Former    Passive Exposure: Not on Actuary Strain: Low Risk (05/16/2023)   Overall Financial Resource Strain (CARDIA)    Difficulty of Paying Living Expenses: Not very hard  Recent Concern: Financial Resource Strain - Medium Risk (04/07/2023)   Overall  Financial Resource Strain (CARDIA)    Difficulty of Paying Living Expenses: Somewhat hard  Food Insecurity: No Food Insecurity (05/16/2023)   Hunger Vital Sign    Worried About Running Out of Food in the Last Year: Never true    Ran Out of Food in the Last Year: Never true  Recent Concern: Food Insecurity - Food Insecurity Present (04/07/2023)   Hunger Vital Sign    Worried About Running Out of Food in the Last Year: Sometimes true    Ran Out of Food in the Last Year: Sometimes true  Transportation Needs: No Transportation Needs (05/16/2023)   PRAPARE - Administrator, Civil Service (Medical): No    Lack of Transportation (Non-Medical): No  Physical Activity: Insufficiently Active (05/16/2023)   Exercise Vital Sign    Days of Exercise per Week: 2 days    Minutes of Exercise per Session: 10 min  Stress: No Stress Concern Present (05/16/2023)   [employer] of Occupational Health - Occupational Stress Questionnaire    Feeling of Stress : Not at all  Social Connections: Moderately Isolated (05/16/2023)   Social Connection and Isolation Panel    Frequency of Communication with Friends and Family: More than three times a week    Frequency of Social Gatherings with Friends and Family: Once a week    Attends Religious Services: 1 to 4 times per year    Active Member of [employer] or Organizations: No    Attends Engineer, Structural: Not on file    Marital Status: Divorced  Depression (PHQ2-9): Low Risk (10/24/2023)   Depression (PHQ2-9)    PHQ-2 Score: 0  Alcohol Screen: Low Risk (05/16/2023)   Alcohol Screen    Last Alcohol Screening Score (AUDIT): 1  Housing: High Risk (05/16/2023)   Housing Stability Vital Sign    Unable to Pay for Housing in the Last Year: No    Number of Times Moved in the Last Year: Not on file    Homeless in the Last Year: Yes  Utilities: Not on file  Health Literacy: Not on file   Allergies[1] Family History  Problem Relation Age of  Onset   Heart disease Mother    Hypertension Mother    Diabetes Mother    Stroke Father    Hypertension Father    Stroke Brother     Current Outpatient Medications (Endocrine & Metabolic):    FARXIGA  10 MG TABS tablet, Take 1 tablet by mouth once daily   glipiZIDE  (GLUCOTROL  XL) 10 MG 24 hr tablet, Take 1 tablet (10 mg total) by mouth 2 (two) times daily.   HUMALOG  KWIKPEN 100 UNIT/ML KwikPen, INJECT 22 UNITS AFTER BREAKFAST AND 18 UNITS AFTER DINNER DAILY.   insulin  aspart (NOVOLOG  FLEXPEN) 100 UNIT/ML FlexPen, Inject 25 Units into the skin daily before breakfast.  insulin  NPH Human (HUMULIN  N) 100 UNIT/ML injection, INJECT UP TO 30 UNITS IN THE MORNING AND 40 UNITS AT BEDTIME   metFORMIN  (GLUCOPHAGE ) 1000 MG tablet, TAKE 1 TABLET BY MOUTH TWICE A DAY WITH FOOD   tirzepatide  (MOUNJARO ) 5 MG/0.5ML Pen, Inject 5 mg into the skin once a week.  Current Outpatient Medications (Cardiovascular):    hydrochlorothiazide  (HYDRODIURIL ) 25 MG tablet, Take 1 tablet (25 mg total) by mouth daily.   lisinopril  (ZESTRIL ) 5 MG tablet, Take 1 tablet (5 mg total) by mouth daily.   lovastatin  (MEVACOR ) 40 MG tablet, TAKE 1 TABLET BY MOUTH EVERYDAY AT BEDTIME  Current Outpatient Medications (Analgesics):    allopurinol  (ZYLOPRIM ) 100 MG tablet, Take 1 tablet (100 mg total) by mouth 2 (two) times daily.   ibuprofen  (ADVIL ) 600 MG tablet, Take 600 mg by mouth every 6 (six) hours as needed.   meloxicam  (MOBIC ) 15 MG tablet, TAKE 1 TABLET BY MOUTH ONCE DAILY AS NEEDED FOR PAIN   traMADol  (ULTRAM ) 50 MG tablet, Take 1 tablet (50 mg total) by mouth every 6 (six) hours as needed.  Current Outpatient Medications (Hematological):    ferrous sulfate  324 (65 Fe) MG TBEC, Take 1 tablet (325 mg total) by mouth daily.   iron  polysaccharides (NU-IRON ) 150 MG capsule, Take 1 capsule (150 mg total) by mouth daily.  Current Outpatient Medications (Other):    Continuous Glucose Sensor (DEXCOM G7 SENSOR) MISC, 1 Device by  Does not apply route as directed. Change sensor every 10 days   cyclobenzaprine  (FLEXERIL ) 5 MG tablet, Take 1 tablet (5 mg total) by mouth 3 (three) times daily as needed.   diclofenac  Sodium (VOLTAREN ) 1 % GEL, Apply 2 g topically 4 (four) times daily.   gabapentin  (NEURONTIN ) 100 MG capsule, Take 2 capsules (200 mg total) by mouth at bedtime.   Insulin  Syringe-Needle U-100 (RELION INSULIN  SYRINGE) 31G X 15/64 0.5 ML MISC, Use to inject insulin  twice a day.   methocarbamol  (ROBAXIN ) 500 MG tablet, Take 1 tablet (500 mg total) by mouth 4 (four) times daily.   neomycin -polymyxin-hydrocortisone (CORTISPORIN) 3.5-10000-1 OTIC suspension, Apply 1-2 drops daily after soaking and cover with bandaid   Ostomy Supplies (SKIN TAC ADHESIVE BARRIER WIPE) MISC, Use to help apply Dexcom sensor every 10 days   OVER THE COUNTER MEDICATION, Take 1 tablet by mouth 2 (two) times daily. Beet Root supplement   pantoprazole  (PROTONIX ) 40 MG tablet, Take 1 tablet (40 mg total) by mouth daily.   RELION PEN NEEDLE 31G/8MM 31G X 8 MM MISC, USE AS DIRECTED TWICE  DAILY   solifenacin  (VESICARE ) 5 MG tablet, Take 1 tablet (5 mg total) by mouth daily.   tamsulosin  (FLOMAX ) 0.4 MG CAPS capsule, Take 1 capsule (0.4 mg total) by mouth daily.   tolnaftate  (TINACTIN) 1 % powder, Apply 1 Application topically 2 (two) times daily.   Reviewed prior external information including notes and imaging from  primary care provider As well as notes that were available from care everywhere and other healthcare systems.  Past medical history, social, surgical and family history all reviewed in electronic medical record.  No pertanent information unless stated regarding to the chief complaint.   Review of Systems:  No headache, visual changes, nausea, vomiting, diarrhea, constipation, dizziness, abdominal pain, skin rash, fevers, chills, night sweats, weight loss, swollen lymph nodes, body aches, joint swelling, chest pain, shortness of  breath, mood changes. POSITIVE muscle aches  Objective  There were no vitals taken for this visit.  General: No apparent distress alert and oriented x3 mood and affect normal, dressed appropriately.  HEENT: Pupils equal, extraocular movements intact  Respiratory: Patient's speak in full sentences and does not appear short of breath  Cardiovascular: No lower extremity edema, non tender, no erythema      Impression and Recommendations:           [1] No Known Allergies  "

## 2024-06-23 ENCOUNTER — Ambulatory Visit: Admitting: Family Medicine

## 2024-06-24 ENCOUNTER — Other Ambulatory Visit: Payer: Self-pay | Admitting: Internal Medicine

## 2024-06-25 NOTE — Progress Notes (Unsigned)
 " [name] [address] [place] [address] Phone: [phone] Subjective:   Steven [name], am serving as a scribe for Dr. Arthea Claudene.  I'm seeing this patient by the request  of:  Norleen Lynwood ORN, MD  CC: Right wrist, low back and right knee pain  YEP:Dlagzrupcz  04/23/2024 DIP right hand.  Discussed the potential of different potential bracing.  Will try a stack splint.  Can do injection if needed.  Discussed icing regimen, patient does have trigger fingers as well as carpal tunnel that can also be contributing.  Patient wants to hold on any injection on those areas.  Feels like that is not what is contributing to as much is the discomfort as well at the moment.  Follow-up with me again in 6 to 12 weeks.     Stable at the moment.  Continue to monitor.     Recurrent lumbar radiculopathy.  Patient states that the testicular pain after the epidural at the previous level was completely gone and then slowly has returned.  Do feel at this point to repeat this.  Differential includes the inguinal hernia that patient has but at the moment they still want to continue to monitor.  Discussed with patient we will see how patient responds after this injection.  Discussed icing regimen.  Follow-up again with me in 6 to 8 weeks after the injection.     Update [id number] Steven [name] is a 64 y.o. male coming in with complaint of R wrist, lumbar spine, and R knee pain. Epidural on 05/12/2024. Patient states that he needs an injection in B knee, L hip and R wrist.   Pain is lumbar spine is not as bad as it was but pain will radiating into testicles intermittently.     Past Medical History:  Diagnosis Date   Chicken pox    Diabetes mellitus without complication (HCC)    Essential hypertension    GERD (gastroesophageal reflux disease)    Gouty arthritis    Hypertension    Iron  deficiency anemia    Vitamin D  deficiency    Past Surgical History:   Procedure Laterality Date   BIOPSY  12/05/2021   Procedure: BIOPSY;  Surgeon: Legrand Victory LITTIE DOUGLAS, MD;  Location: [facility];  Service: Gastroenterology;;   ESOPHAGOGASTRODUODENOSCOPY N/A 12/05/2021   Procedure: ESOPHAGOGASTRODUODENOSCOPY (EGD);  Surgeon: Legrand Victory LITTIE DOUGLAS, MD;  Location: [facility];  Service: Gastroenterology;  Laterality: N/A;   HOT HEMOSTASIS N/A 12/05/2021   Procedure: HOT HEMOSTASIS (ARGON PLASMA COAGULATION/BICAP);  Surgeon: Legrand Victory LITTIE DOUGLAS, MD;  Location: [facility];  Service: Gastroenterology;  Laterality: N/A;   INGUINAL HERNIA REPAIR Right 12/19/2017   Procedure: HERNIA REPAIR INGUINAL RIGHT;  Surgeon: Vernetta Berg, MD;  Location: [facility];  Service: General;  Laterality: Right;   INSERTION OF MESH Right 12/19/2017   Procedure: INSERTION OF MESH;  Surgeon: Vernetta Berg, MD;  Location: [facility];  Service: General;  Laterality: Right;   MASS EXCISION Right 12/19/2017   Procedure: EXCISION OF RIGHT SCROTAL MASS;  Surgeon: Vernetta Berg, MD;  Location: [facility];  Service: General;  Laterality: Right;   TONSILLECTOMY     Social History   Socioeconomic History   Marital status: Divorced    Spouse name: Not on file   Number of children: 1   Years of education: 12   Highest education level: 12th grade  Occupational History   Occupation: retired  Tobacco Use   Smoking status: Former  Current packs/day: 2.00    Average packs/day: 2.0 packs/day for 25.0 years (50.0 ttl pk-yrs)    Types: Cigarettes   Smokeless tobacco: Former    Types: Engineer, Drilling   Vaping status: Never Used  Substance and Sexual Activity   Alcohol use: Not Currently   Drug use: No   Sexual activity: Yes    Birth control/protection: Condom  Other Topics Concern   Not on file  Social History Narrative   Fun: Just working   Social Drivers of Health   Tobacco Use: Medium Risk (06/12/2024)   Patient History    Smoking Tobacco Use: Former    Smokeless Tobacco Use: Former     Passive Exposure: Not on Actuary Strain: Low Risk (05/16/2023)   Overall Financial Resource Strain (CARDIA)    Difficulty of Paying Living Expenses: Not very hard  Recent Concern: Financial Resource Strain - Medium Risk (04/07/2023)   Overall Financial Resource Strain (CARDIA)    Difficulty of Paying Living Expenses: Somewhat hard  Food Insecurity: No Food Insecurity (05/16/2023)   Hunger Vital Sign    Worried About Running Out of Food in the Last Year: Never true    Ran Out of Food in the Last Year: Never true  Recent Concern: Food Insecurity - Food Insecurity Present (04/07/2023)   Hunger Vital Sign    Worried About Running Out of Food in the Last Year: Sometimes true    Ran Out of Food in the Last Year: Sometimes true  Transportation Needs: No Transportation Needs (05/16/2023)   PRAPARE - Administrator, Civil Service (Medical): No    Lack of Transportation (Non-Medical): No  Physical Activity: Insufficiently Active (05/16/2023)   Exercise Vital Sign    Days of Exercise per Week: 2 days    Minutes of Exercise per Session: 10 min  Stress: No Stress Concern Present (05/16/2023)   [employer] of Occupational Health - Occupational Stress Questionnaire    Feeling of Stress : Not at all  Social Connections: Moderately Isolated (05/16/2023)   Social Connection and Isolation Panel    Frequency of Communication with Friends and Family: More than three times a week    Frequency of Social Gatherings with Friends and Family: Once a week    Attends Religious Services: 1 to 4 times per year    Active Member of Clubs or Organizations: No    Attends Engineer, Structural: Not on file    Marital Status: Divorced  Depression (PHQ2-9): Low Risk (10/24/2023)   Depression (PHQ2-9)    PHQ-2 Score: 0  Alcohol Screen: Low Risk (05/16/2023)   Alcohol Screen    Last Alcohol Screening Score (AUDIT): 1  Housing: High Risk (05/16/2023)   Housing Stability  Vital Sign    Unable to Pay for Housing in the Last Year: No    Number of Times Moved in the Last Year: Not on file    Homeless in the Last Year: Yes  Utilities: Not on file  Health Literacy: Not on file   Allergies[1] Family History  Problem Relation Age of Onset   Heart disease Mother    Hypertension Mother    Diabetes Mother    Stroke Father    Hypertension Father    Stroke Brother     Current Outpatient Medications (Endocrine & Metabolic):    empagliflozin  (JARDIANCE ) 25 MG TABS tablet, Take 1 tablet (25 mg total) by mouth daily.   FARXIGA  10 MG TABS tablet, Take  1 tablet by mouth once daily   glipiZIDE  (GLUCOTROL  XL) 10 MG 24 hr tablet, Take 1 tablet (10 mg total) by mouth 2 (two) times daily.   HUMALOG  KWIKPEN 100 UNIT/ML KwikPen, INJECT 22 UNITS AFTER BREAKFAST AND 18 UNITS AFTER DINNER DAILY.   insulin  aspart (NOVOLOG  FLEXPEN) 100 UNIT/ML FlexPen, Inject 25 Units into the skin daily before breakfast.   insulin  NPH Human (HUMULIN  N) 100 UNIT/ML injection, INJECT UP TO 30 UNITS IN THE MORNING AND 40 UNITS AT BEDTIME   metFORMIN  (GLUCOPHAGE ) 1000 MG tablet, TAKE 1 TABLET BY MOUTH TWICE A DAY WITH FOOD   tirzepatide  (MOUNJARO ) 5 MG/0.5ML Pen, Inject 5 mg into the skin once a week.  Current Outpatient Medications (Cardiovascular):    hydrochlorothiazide  (HYDRODIURIL ) 25 MG tablet, Take 1 tablet (25 mg total) by mouth daily.   lisinopril  (ZESTRIL ) 5 MG tablet, Take 1 tablet (5 mg total) by mouth daily.   lovastatin  (MEVACOR ) 40 MG tablet, TAKE 1 TABLET BY MOUTH EVERYDAY AT BEDTIME  Current Outpatient Medications (Analgesics):    allopurinol  (ZYLOPRIM ) 100 MG tablet, Take 1 tablet (100 mg total) by mouth 2 (two) times daily.   ibuprofen  (ADVIL ) 600 MG tablet, Take 600 mg by mouth every 6 (six) hours as needed.   indomethacin  (INDOCIN ) 25 MG capsule, TAKE 1 CAPSULE BY MOUTH TWICE DAILY AS NEEDED   meloxicam  (MOBIC ) 15 MG tablet, TAKE 1 TABLET BY MOUTH ONCE DAILY AS NEEDED FOR  PAIN   traMADol  (ULTRAM ) 50 MG tablet, Take 1 tablet (50 mg total) by mouth every 6 (six) hours as needed.  Current Outpatient Medications (Hematological):    ferrous sulfate  324 (65 Fe) MG TBEC, Take 1 tablet (325 mg total) by mouth daily.   iron  polysaccharides (NU-IRON ) 150 MG capsule, Take 1 capsule (150 mg total) by mouth daily.  Current Outpatient Medications (Other):    Continuous Glucose Sensor (DEXCOM G7 SENSOR) MISC, 1 Device by Does not apply route as directed. Change sensor every 10 days   cyclobenzaprine  (FLEXERIL ) 5 MG tablet, Take 1 tablet (5 mg total) by mouth 3 (three) times daily as needed.   diclofenac  Sodium (VOLTAREN ) 1 % GEL, Apply 2 g topically 4 (four) times daily.   gabapentin  (NEURONTIN ) 100 MG capsule, Take 2 capsules (200 mg total) by mouth at bedtime.   Insulin  Syringe-Needle U-100 (RELION INSULIN  SYRINGE) 31G X 15/64 0.5 ML MISC, Use to inject insulin  twice a day.   methocarbamol  (ROBAXIN ) 500 MG tablet, Take 1 tablet (500 mg total) by mouth 4 (four) times daily.   neomycin -polymyxin-hydrocortisone (CORTISPORIN) 3.5-10000-1 OTIC suspension, Apply 1-2 drops daily after soaking and cover with bandaid   Ostomy Supplies (SKIN TAC ADHESIVE BARRIER WIPE) MISC, Use to help apply Dexcom sensor every 10 days   OVER THE COUNTER MEDICATION, Take 1 tablet by mouth 2 (two) times daily. Beet Root supplement   pantoprazole  (PROTONIX ) 40 MG tablet, Take 1 tablet (40 mg total) by mouth daily.   RELION PEN NEEDLE 31G/8MM 31G X 8 MM MISC, USE AS DIRECTED TWICE  DAILY   solifenacin  (VESICARE ) 5 MG tablet, Take 1 tablet (5 mg total) by mouth daily.   tamsulosin  (FLOMAX ) 0.4 MG CAPS capsule, Take 1 capsule (0.4 mg total) by mouth daily.   tolnaftate  (TINACTIN) 1 % powder, Apply 1 Application topically 2 (two) times daily.   Reviewed prior external information including notes and imaging from  primary care provider As well as notes that were available from care everywhere and other  healthcare  systems.  Past medical history, social, surgical and family history all reviewed in electronic medical record.  No pertanent information unless stated regarding to the chief complaint.   Review of Systems:  No headache, visual changes, nausea, vomiting, diarrhea, constipation, dizziness, abdominal pain, skin rash, fevers, chills, night sweats, weight loss, swollen lymph nodes, body aches, joint swelling, chest pain, shortness of breath, mood changes. POSITIVE muscle aches  Objective  Blood pressure 124/70, pulse 69, height 5' 8 (1.727 m), weight 239 lb (108.4 kg), SpO2 97%.   General: No apparent distress alert and oriented x3 mood and affect normal, dressed appropriately.  HEENT: Pupils equal, extraocular movements intact  Respiratory: Patient's speak in full sentences and does not appear short of breath  Cardiovascular: No lower extremity edema, non tender, no erythema  Arthritic changes noted of multiple joints.  Patient stated knees to have instability with valgus and varus force.  Crepitus noted with lateral tracking of the patella.  Patient's right wrist does have a positive Tinel's noted.  Procedure: Real-time Ultrasound Guided Injection of right carpal tunnel Device: GE Logiq Q7  Ultrasound guided injection is preferred based studies that show increased duration, increased effect, greater accuracy, decreased procedural pain, increased response rate with ultrasound guided versus blind injection.  Verbal informed consent obtained.  Time-out conducted.  Noted no overlying erythema, induration, or other signs of local infection.  Skin prepped in a sterile fashion.  Local anesthesia: Topical Ethyl chloride.  With sterile technique and under real time ultrasound guidance:  median nerve visualized.  23g 5/8 inch needle inserted distal to proximal approach into nerve sheath. Pictures taken nfor needle placement. Patient did have injection of 1 cc of 0.5% Marcaine , and 1 cc of  Kenalog  40 mg/dL. Completed without difficulty  Pain immediately resolved suggesting accurate placement of the medication.  Advised to call if fevers/chills, erythema, induration, drainage, or persistent bleeding.  Images saved Impression: Technically successful ultrasound guided injection.  After informed written and verbal consent, patient was seated on exam table. Right knee was prepped with alcohol swab and utilizing anterolateral approach, patient's right knee space was injected with 4:1  marcaine  0.5%: Kenalog  40mg /dL. Patient tolerated the procedure well without immediate complications.  After informed written and verbal consent, patient was seated on exam table. Left knee was prepped with alcohol swab and utilizing anterolateral approach, patient's left knee space was injected with 4:1  marcaine  0.5%: Kenalog  40mg /dL. Patient tolerated the procedure well without immediate complications.   Procedure: Real-time Ultrasound Guided Injection of left  greater trochanteric bursitis secondary to patient's body habitus Device: GE Logiq Q7  Ultrasound guided injection is preferred based studies that show increased duration, increased effect, greater accuracy, decreased procedural pain, increased response rate, and decreased cost with ultrasound guided versus blind injection.  Verbal informed consent obtained.  Time-out conducted.  Noted no overlying erythema, induration, or other signs of local infection.  Skin prepped in a sterile fashion.  Local anesthesia: Topical Ethyl chloride.  With sterile technique and under real time ultrasound guidance:  Greater trochanteric area was visualized and patient's bursa was noted. A 22-gauge 3 inch needle was inserted and 4 cc of 0.5% Marcaine  and 1 cc of Kenalog  40 mg/dL was injected. Pictures taken Completed without difficulty  Pain immediately resolved suggesting accurate placement of the medication.  Advised to call if fevers/chills, erythema,  induration, drainage, or persistent bleeding.  Images permanently stored  Impression: Technically successful ultrasound guided injection.   Impression and Recommendations:  The above documentation has been reviewed and is accurate and complete Indiyah Paone M Omar Orrego, DO       [1] No Known Allergies  "

## 2024-06-26 ENCOUNTER — Ambulatory Visit: Admitting: Family Medicine

## 2024-06-26 ENCOUNTER — Other Ambulatory Visit: Payer: Self-pay

## 2024-06-26 VITALS — BP 124/70 | HR 69 | Ht 68.0 in | Wt 239.0 lb

## 2024-06-26 DIAGNOSIS — M1712 Unilateral primary osteoarthritis, left knee: Secondary | ICD-10-CM

## 2024-06-26 DIAGNOSIS — G5601 Carpal tunnel syndrome, right upper limb: Secondary | ICD-10-CM

## 2024-06-26 DIAGNOSIS — M1711 Unilateral primary osteoarthritis, right knee: Secondary | ICD-10-CM

## 2024-06-26 DIAGNOSIS — E1142 Type 2 diabetes mellitus with diabetic polyneuropathy: Secondary | ICD-10-CM

## 2024-06-26 DIAGNOSIS — M25531 Pain in right wrist: Secondary | ICD-10-CM

## 2024-06-26 DIAGNOSIS — M7062 Trochanteric bursitis, left hip: Secondary | ICD-10-CM | POA: Insufficient documentation

## 2024-06-26 NOTE — Assessment & Plan Note (Signed)
 Patient given injection and tolerated the procedure well, discussed icing regimen and home exercises, which activities to do and which ones to avoid.  Increase activity slowly.  Follow-up again in 6 to 12 weeks

## 2024-06-26 NOTE — Assessment & Plan Note (Signed)
 Chronic problem with exacerbation.  Patient is still working and wants to avoid any type of surgical intervention.  Discussed with the steroid and patient's diabetes to monitor blood sugar closely.  Patient does have a glucose monitor that is continuous.  Follow-up again in 6 to 8 weeks otherwise.

## 2024-06-26 NOTE — Assessment & Plan Note (Signed)
 Chronic problem with exacerbation.  Patient is still trying to stay active.  To avoid any surgical intervention BMI is above 35 so encouraged him to lose some potential weight.  Discussed icing regimen and home exercises, which activities to do and which ones to avoid.  Increase activity slowly.  Follow-up again in 6 to 12 weeks

## 2024-06-26 NOTE — Assessment & Plan Note (Signed)
 Injection given today tolerated procedure well known arthritic changes.  Patient has some lumbar radiculopathy but better after the epidural in the back.  Follow-up again 6 to 12 weeks.  Still wants to avoid surgical intervention

## 2024-06-26 NOTE — Assessment & Plan Note (Signed)
 Discussed with patient to monitor closely his glucose.  Patient knows that if he gets too high he will seek medical attention.

## 2024-06-26 NOTE — Patient Instructions (Addendum)
 Injected L GT Injection R wrist Injected B knees If blood sugar goes above 350 see ED Stay hydrated See me again in 3 months

## 2024-07-04 ENCOUNTER — Other Ambulatory Visit: Payer: Self-pay | Admitting: "Endocrinology

## 2024-07-04 DIAGNOSIS — E1165 Type 2 diabetes mellitus with hyperglycemia: Secondary | ICD-10-CM

## 2024-07-10 ENCOUNTER — Ambulatory Visit: Admitting: "Endocrinology

## 2024-07-28 ENCOUNTER — Ambulatory Visit: Admitting: Family Medicine

## 2024-08-05 ENCOUNTER — Ambulatory Visit: Admitting: Family Medicine

## 2024-08-07 ENCOUNTER — Ambulatory Visit: Admitting: Podiatry

## 2024-09-11 ENCOUNTER — Encounter: Admitting: Dietician

## 2024-09-24 ENCOUNTER — Ambulatory Visit: Admitting: Family Medicine
